# Patient Record
Sex: Male | Born: 1935
Health system: Southern US, Community
[De-identification: ages and names within clinical notes are randomized; demographics above are authoritative.]

## PROBLEM LIST (undated history)

## (undated) DIAGNOSIS — Z950 Presence of cardiac pacemaker: Secondary | ICD-10-CM

## (undated) DIAGNOSIS — E785 Hyperlipidemia, unspecified: Secondary | ICD-10-CM

## (undated) DIAGNOSIS — N289 Disorder of kidney and ureter, unspecified: Secondary | ICD-10-CM

## (undated) DIAGNOSIS — J189 Pneumonia, unspecified organism: Secondary | ICD-10-CM

## (undated) DIAGNOSIS — E119 Type 2 diabetes mellitus without complications: Secondary | ICD-10-CM

## (undated) DIAGNOSIS — I509 Heart failure, unspecified: Secondary | ICD-10-CM

## (undated) DIAGNOSIS — M1990 Unspecified osteoarthritis, unspecified site: Secondary | ICD-10-CM

## (undated) DIAGNOSIS — W19XXXA Unspecified fall, initial encounter: Secondary | ICD-10-CM

## (undated) DIAGNOSIS — D860 Sarcoidosis of lung: Secondary | ICD-10-CM

## (undated) DIAGNOSIS — C801 Malignant (primary) neoplasm, unspecified: Secondary | ICD-10-CM

## (undated) DIAGNOSIS — I499 Cardiac arrhythmia, unspecified: Secondary | ICD-10-CM

## (undated) DIAGNOSIS — I10 Essential (primary) hypertension: Secondary | ICD-10-CM

## (undated) DIAGNOSIS — N184 Chronic kidney disease, stage 4 (severe): Secondary | ICD-10-CM

## (undated) HISTORY — PX: LUNG BIOPSY: SHX232

## (undated) HISTORY — DX: Essential (primary) hypertension: I10

## (undated) HISTORY — DX: Hyperlipidemia, unspecified: E78.5

## (undated) HISTORY — DX: Cardiac arrhythmia, unspecified: I49.9

## (undated) HISTORY — PX: NASAL SINUS SURGERY: SHX719

## (undated) HISTORY — PX: MOUTH SURGERY: SHX715

## (undated) HISTORY — PX: INSERT / REPLACE / REMOVE PACEMAKER: SUR710

## (undated) HISTORY — PX: TUMOR EXCISION: SHX421

## (undated) HISTORY — PX: CATARACT EXTRACTION W/ INTRAOCULAR LENS  IMPLANT, BILATERAL: SHX1307

## (undated) HISTORY — PX: SHOULDER OPEN ROTATOR CUFF REPAIR: SHX2407

---
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
## 1998-04-05 ENCOUNTER — Ambulatory Visit (HOSPITAL_BASED_OUTPATIENT_CLINIC_OR_DEPARTMENT_OTHER): Admission: RE | Admit: 1998-04-05 | Discharge: 1998-04-05 | Payer: Self-pay | Admitting: Orthopaedic Surgery

## 1998-04-25 ENCOUNTER — Encounter: Admission: RE | Admit: 1998-04-25 | Discharge: 1998-07-24 | Payer: Self-pay | Admitting: Orthopaedic Surgery

## 1998-08-01 ENCOUNTER — Encounter: Admission: RE | Admit: 1998-08-01 | Discharge: 1998-10-30 | Payer: Self-pay | Admitting: Orthopaedic Surgery

## 2001-03-30 ENCOUNTER — Encounter: Admission: RE | Admit: 2001-03-30 | Discharge: 2001-03-30 | Payer: Self-pay | Admitting: *Deleted

## 2001-04-21 ENCOUNTER — Ambulatory Visit (HOSPITAL_COMMUNITY): Admission: RE | Admit: 2001-04-21 | Discharge: 2001-04-21 | Payer: Self-pay | Admitting: *Deleted

## 2001-05-27 ENCOUNTER — Ambulatory Visit (HOSPITAL_COMMUNITY): Admission: RE | Admit: 2001-05-27 | Discharge: 2001-05-27 | Payer: Self-pay | Admitting: *Deleted

## 2001-05-27 ENCOUNTER — Encounter: Admission: RE | Admit: 2001-05-27 | Discharge: 2001-05-27 | Payer: Self-pay | Admitting: *Deleted

## 2001-11-19 ENCOUNTER — Ambulatory Visit (HOSPITAL_COMMUNITY): Admission: RE | Admit: 2001-11-19 | Discharge: 2001-11-19 | Payer: Self-pay

## 2001-12-07 ENCOUNTER — Encounter (INDEPENDENT_AMBULATORY_CARE_PROVIDER_SITE_OTHER): Payer: Self-pay | Admitting: *Deleted

## 2001-12-07 ENCOUNTER — Ambulatory Visit (HOSPITAL_BASED_OUTPATIENT_CLINIC_OR_DEPARTMENT_OTHER): Admission: RE | Admit: 2001-12-07 | Discharge: 2001-12-07 | Payer: Self-pay | Admitting: Otolaryngology

## 2005-03-03 ENCOUNTER — Ambulatory Visit: Payer: Self-pay | Admitting: Gastroenterology

## 2005-03-14 ENCOUNTER — Ambulatory Visit: Payer: Self-pay | Admitting: Gastroenterology

## 2009-09-13 ENCOUNTER — Encounter (INDEPENDENT_AMBULATORY_CARE_PROVIDER_SITE_OTHER): Payer: Self-pay | Admitting: *Deleted

## 2009-10-04 ENCOUNTER — Encounter (INDEPENDENT_AMBULATORY_CARE_PROVIDER_SITE_OTHER): Payer: Self-pay | Admitting: *Deleted

## 2009-10-05 ENCOUNTER — Encounter (INDEPENDENT_AMBULATORY_CARE_PROVIDER_SITE_OTHER): Payer: Self-pay | Admitting: *Deleted

## 2009-10-05 ENCOUNTER — Ambulatory Visit: Payer: Self-pay | Admitting: Gastroenterology

## 2009-10-26 ENCOUNTER — Ambulatory Visit: Payer: Self-pay | Admitting: Gastroenterology

## 2009-11-06 ENCOUNTER — Encounter: Payer: Self-pay | Admitting: Gastroenterology

## 2011-02-18 LAB — GLUCOSE, CAPILLARY
Glucose-Capillary: 118 mg/dL — ABNORMAL HIGH (ref 70–99)
Glucose-Capillary: 150 mg/dL — ABNORMAL HIGH (ref 70–99)

## 2011-04-04 NOTE — Op Note (Signed)
[facility]  Patient:    Ruben Conner, Ruben Conner Visit Number: [id number] MRN: [id number]          Service Type: DSU Location: [facility] Attending Physician:  Clare Charon Dictated by:   Leonides Sake Lucia Gaskins, M.D. Proc. Date: 12/07/01 Admit Date:  12/07/2001 Discharge Date: 12/07/2001                             Operative Report  PREOPERATIVE DIAGNOSIS: 1. Left tail of parotid mass, parotid neoplasm versus lymph node.  POSTOPERATIVE DIAGNOSIS:  Left tail of parotid mass, frozen section consistent with Warthins tumor.  OPERATION PERFORMED:  Excision of the left tail of parotid mass without facial nerve dissection.  Direct laryngoscopy.  SURGEON:  Leonides Sake. Lucia Gaskins, M.D.  ANESTHESIA:  General endotracheal.  COMPLICATIONS:  None.  INDICATIONS FOR PROCEDURE:  Ruben Conner is a 77 year old gentleman who has had a slowly enlarging left neck mass located in the region of the tail of the parotid just behind the angle of the mandible, just inferior to the left ear. CT scan shows the mass to be deep to the left tail of the parotid tissue. Question of whether this represents a parotid neoplasm versus a large parotid lymph node.  I have discussed with Dr. Gilford Rile concerning excising this mass. It may or may not require dissection of the facial nerve and that this may represent a parotid neoplasm versus an enlarged lymph node and possible direct laryngoscopy if this represents carcinoma.  Of note, evaluation in the office reveals normal upper airway exam in the office.  DESCRIPTION OF PROCEDURE:  After adequate endotracheal anesthesia, the left neck was prepped with Betadine and draped out with sterile towels.  An incision was made just posterior to the mass.  Dissection was carried down until we hit the parotid fascia.  The mass was located just deep to the tail of parotid on the left side.  In dissection, the greater auricular nerve  was identified and preserved as the dissection was carried out posterior to the greater auricular nerve.  The mass was well encapsulated and was dissected out of the tail of the parotid without any difficulty.  A single 2-0 silk ligature was used to ligate a deep vessel and cautery was used to cauterize small veins.  Specimen was sent for frozen section as it appeared to possibly represent a lymph node versus parotid neoplasm.  It was encapsulated.  There were a few small lymph nodes close by and these were excised with a specimen. The facial nerve was not dissected out.  The wound was irrigated with saline and closed with 3-0 chromic sutures subcutaneously and 5-0 nylon on the skin. While awaiting frozen section, direct laryngoscopy was performed.  The base of tongue, epiglottis, vocal cords, aryepiglottic folds and piriform sinuses were all clear.  Palpation of the parotid area felt benign.  Palpation of the nasopharynx was benign.  This completed direct laryngoscopy.  Frozen section pathology report revealed findings consistent with Warthins tumor.  The patient was awakened from anesthesia and transferred to recovery room postoperatively doing well.  DISPOSITION:  Ruben Conner is discharged home later this morning.  Will have him follow up in the office in one week to have sutures removed and review final pathology. Dictated by:   Leonides Sake Lucia Gaskins, M.D. Attending Physician:  Clare Charon DD:  12/07/01 TD:  12/08/01 Job: [id number]

## 2012-01-27 DIAGNOSIS — N3000 Acute cystitis without hematuria: Secondary | ICD-10-CM | POA: Diagnosis not present

## 2012-01-27 DIAGNOSIS — R3129 Other microscopic hematuria: Secondary | ICD-10-CM | POA: Diagnosis not present

## 2012-01-27 DIAGNOSIS — N401 Enlarged prostate with lower urinary tract symptoms: Secondary | ICD-10-CM | POA: Diagnosis not present

## 2012-01-27 DIAGNOSIS — R82998 Other abnormal findings in urine: Secondary | ICD-10-CM | POA: Diagnosis not present

## 2012-01-27 DIAGNOSIS — N433 Hydrocele, unspecified: Secondary | ICD-10-CM | POA: Diagnosis not present

## 2012-02-09 DIAGNOSIS — E119 Type 2 diabetes mellitus without complications: Secondary | ICD-10-CM | POA: Diagnosis not present

## 2012-02-09 DIAGNOSIS — H4011X0 Primary open-angle glaucoma, stage unspecified: Secondary | ICD-10-CM | POA: Diagnosis not present

## 2012-02-09 DIAGNOSIS — H409 Unspecified glaucoma: Secondary | ICD-10-CM | POA: Diagnosis not present

## 2012-02-09 DIAGNOSIS — H353190 Nonexudative age-related macular degeneration, unspecified eye, stage unspecified: Secondary | ICD-10-CM | POA: Diagnosis not present

## 2012-03-23 DIAGNOSIS — R3129 Other microscopic hematuria: Secondary | ICD-10-CM | POA: Diagnosis not present

## 2012-03-23 DIAGNOSIS — Z87440 Personal history of urinary (tract) infections: Secondary | ICD-10-CM | POA: Diagnosis not present

## 2012-05-10 DIAGNOSIS — E785 Hyperlipidemia, unspecified: Secondary | ICD-10-CM | POA: Diagnosis not present

## 2012-05-10 DIAGNOSIS — Z23 Encounter for immunization: Secondary | ICD-10-CM | POA: Diagnosis not present

## 2012-05-10 DIAGNOSIS — I10 Essential (primary) hypertension: Secondary | ICD-10-CM | POA: Diagnosis not present

## 2012-05-10 DIAGNOSIS — F17200 Nicotine dependence, unspecified, uncomplicated: Secondary | ICD-10-CM | POA: Diagnosis not present

## 2012-05-17 DIAGNOSIS — E785 Hyperlipidemia, unspecified: Secondary | ICD-10-CM | POA: Diagnosis not present

## 2012-05-17 DIAGNOSIS — E119 Type 2 diabetes mellitus without complications: Secondary | ICD-10-CM | POA: Diagnosis not present

## 2012-05-17 DIAGNOSIS — I10 Essential (primary) hypertension: Secondary | ICD-10-CM | POA: Diagnosis not present

## 2012-07-30 DIAGNOSIS — H4011X0 Primary open-angle glaucoma, stage unspecified: Secondary | ICD-10-CM | POA: Diagnosis not present

## 2012-07-30 DIAGNOSIS — H2510 Age-related nuclear cataract, unspecified eye: Secondary | ICD-10-CM | POA: Diagnosis not present

## 2012-08-12 DIAGNOSIS — H2589 Other age-related cataract: Secondary | ICD-10-CM | POA: Diagnosis not present

## 2012-08-12 DIAGNOSIS — H2510 Age-related nuclear cataract, unspecified eye: Secondary | ICD-10-CM | POA: Diagnosis not present

## 2012-08-20 DIAGNOSIS — H2510 Age-related nuclear cataract, unspecified eye: Secondary | ICD-10-CM | POA: Diagnosis not present

## 2012-08-20 DIAGNOSIS — H4011X0 Primary open-angle glaucoma, stage unspecified: Secondary | ICD-10-CM | POA: Diagnosis not present

## 2012-08-26 DIAGNOSIS — H43319 Vitreous membranes and strands, unspecified eye: Secondary | ICD-10-CM | POA: Diagnosis not present

## 2012-08-26 DIAGNOSIS — H2510 Age-related nuclear cataract, unspecified eye: Secondary | ICD-10-CM | POA: Diagnosis not present

## 2012-08-26 DIAGNOSIS — H2589 Other age-related cataract: Secondary | ICD-10-CM | POA: Diagnosis not present

## 2012-09-27 DIAGNOSIS — Z23 Encounter for immunization: Secondary | ICD-10-CM | POA: Diagnosis not present

## 2012-11-26 DIAGNOSIS — E119 Type 2 diabetes mellitus without complications: Secondary | ICD-10-CM | POA: Diagnosis not present

## 2012-12-03 DIAGNOSIS — E119 Type 2 diabetes mellitus without complications: Secondary | ICD-10-CM | POA: Diagnosis not present

## 2012-12-03 DIAGNOSIS — I10 Essential (primary) hypertension: Secondary | ICD-10-CM | POA: Diagnosis not present

## 2012-12-03 DIAGNOSIS — E785 Hyperlipidemia, unspecified: Secondary | ICD-10-CM | POA: Diagnosis not present

## 2013-04-19 DIAGNOSIS — H524 Presbyopia: Secondary | ICD-10-CM | POA: Diagnosis not present

## 2013-04-19 DIAGNOSIS — H353190 Nonexudative age-related macular degeneration, unspecified eye, stage unspecified: Secondary | ICD-10-CM | POA: Diagnosis not present

## 2013-04-19 DIAGNOSIS — E119 Type 2 diabetes mellitus without complications: Secondary | ICD-10-CM | POA: Diagnosis not present

## 2013-04-19 DIAGNOSIS — H4011X0 Primary open-angle glaucoma, stage unspecified: Secondary | ICD-10-CM | POA: Diagnosis not present

## 2013-05-02 ENCOUNTER — Telehealth: Payer: Self-pay | Admitting: Family Medicine

## 2013-05-02 ENCOUNTER — Encounter: Payer: Self-pay | Admitting: Physician Assistant

## 2013-05-02 ENCOUNTER — Ambulatory Visit (INDEPENDENT_AMBULATORY_CARE_PROVIDER_SITE_OTHER): Payer: Medicare Other | Admitting: Physician Assistant

## 2013-05-02 VITALS — BP 141/62 | HR 46 | Temp 97.4°F | Ht 72.0 in | Wt 204.0 lb

## 2013-05-02 DIAGNOSIS — L0390 Cellulitis, unspecified: Secondary | ICD-10-CM | POA: Diagnosis not present

## 2013-05-02 DIAGNOSIS — E119 Type 2 diabetes mellitus without complications: Secondary | ICD-10-CM

## 2013-05-02 DIAGNOSIS — E7800 Pure hypercholesterolemia, unspecified: Secondary | ICD-10-CM | POA: Insufficient documentation

## 2013-05-02 DIAGNOSIS — E785 Hyperlipidemia, unspecified: Secondary | ICD-10-CM | POA: Diagnosis not present

## 2013-05-02 DIAGNOSIS — L0291 Cutaneous abscess, unspecified: Secondary | ICD-10-CM | POA: Diagnosis not present

## 2013-05-02 DIAGNOSIS — E1122 Type 2 diabetes mellitus with diabetic chronic kidney disease: Secondary | ICD-10-CM | POA: Insufficient documentation

## 2013-05-02 MED ORDER — CEPHALEXIN 500 MG PO CAPS: 500.0000 mg | ORAL_CAPSULE | Freq: Two times a day (BID) | ORAL | Status: DC

## 2013-05-02 NOTE — Telephone Encounter (Signed)
appt made

## 2013-05-02 NOTE — Progress Notes (Signed)
Subjective:     Patient ID: Ruben Conner, male   DOB: 07-26-1930, 79 y.o.   MRN: [id number]  HPI Pt with draining area to this R buttocks States he has a hx of pilonidal cyst removal ~ 40 yrs ago and has done well He noticed a similar area to the R buttock that started several days ago It increased in size and pain but ruptured this am Sx improved since rupture No hx of MRSA  Review of Systems  All other systems reviewed and are negative.       Objective:   Physical Exam  Nursing note and vitals reviewed. Pt with ~ 3cm area to the medial R buttock + drainage + erythema and induration ++ TTP Area cleansed and dressing placed     Assessment:     Abscess    Plan:     Wound care reviewed Keflex 500mg  bid x 10 days Warm soaks F/U prn

## 2013-05-02 NOTE — Patient Instructions (Signed)
Abscess An abscess is an infected area that contains a collection of pus and debris.It can occur in almost any part of the body. An abscess is also known as a furuncle or boil. CAUSES  An abscess occurs when tissue gets infected. This can occur from blockage of oil or sweat glands, infection of hair follicles, or a minor injury to the skin. As the body tries to fight the infection, pus collects in the area and creates pressure under the skin. This pressure causes pain. People with weakened immune systems have difficulty fighting infections and get certain abscesses more often.  SYMPTOMS Usually an abscess develops on the skin and becomes a painful mass that is red, warm, and tender. If the abscess forms under the skin, you may feel a moveable soft area under the skin. Some abscesses break open (rupture) on their own, but most will continue to get worse without care. The infection can spread deeper into the body and eventually into the bloodstream, causing you to feel ill.  DIAGNOSIS  Your caregiver will take your medical history and perform a physical exam. A sample of fluid may also be taken from the abscess to determine what is causing your infection. TREATMENT  Your caregiver may prescribe antibiotic medicines to fight the infection. However, taking antibiotics alone usually does not cure an abscess. Your caregiver may need to make a small cut (incision) in the abscess to drain the pus. In some cases, gauze is packed into the abscess to reduce pain and to continue draining the area. HOME CARE INSTRUCTIONS   Only take over-the-counter or prescription medicines for pain, discomfort, or fever as directed by your caregiver.  If you were prescribed antibiotics, take them as directed. Finish them even if you start to feel better.  If gauze is used, follow your caregiver's directions for changing the gauze.  To avoid spreading the infection:  Keep your draining abscess covered with a  bandage.  Wash your hands well.  Do not share personal care items, towels, or whirlpools with others.  Avoid skin contact with others.  Keep your skin and clothes clean around the abscess.  Keep all follow-up appointments as directed by your caregiver. SEEK MEDICAL CARE IF:   You have increased pain, swelling, redness, fluid drainage, or bleeding.  You have muscle aches, chills, or a general ill feeling.  You have a fever. MAKE SURE YOU:   Understand these instructions.  Will watch your condition.  Will get help right away if you are not doing well or get worse. Document Released: 08/13/2005 Document Revised: 05/04/2012 Document Reviewed: 01/16/2012 [employer] Patient Information 2014 [employer].  

## 2013-05-17 DIAGNOSIS — Z87440 Personal history of urinary (tract) infections: Secondary | ICD-10-CM | POA: Diagnosis not present

## 2013-05-17 DIAGNOSIS — R3129 Other microscopic hematuria: Secondary | ICD-10-CM | POA: Diagnosis not present

## 2013-08-24 DIAGNOSIS — Z23 Encounter for immunization: Secondary | ICD-10-CM | POA: Diagnosis not present

## 2013-09-05 ENCOUNTER — Other Ambulatory Visit (INDEPENDENT_AMBULATORY_CARE_PROVIDER_SITE_OTHER): Payer: Medicare Other

## 2013-09-05 DIAGNOSIS — I10 Essential (primary) hypertension: Secondary | ICD-10-CM

## 2013-09-05 DIAGNOSIS — E785 Hyperlipidemia, unspecified: Secondary | ICD-10-CM

## 2013-09-05 DIAGNOSIS — E119 Type 2 diabetes mellitus without complications: Secondary | ICD-10-CM | POA: Diagnosis not present

## 2013-09-05 NOTE — Progress Notes (Signed)
Patient came in with orders from Dr. Milford Cage is making appointment to see Dr.Moore

## 2013-09-06 LAB — LIPID PANEL
HDL: 37 mg/dL — ABNORMAL LOW (ref >39)
Triglycerides: 139 mg/dL (ref 0–149)
VLDL Cholesterol Cal: 28 mg/dL (ref 5–40)

## 2013-09-06 LAB — CMP14+EGFR
ALT: 9 IU/L (ref 0–44)
AST: 12 IU/L (ref 0–40)
CO2: 27 mmol/L (ref 18–29)
Calcium: 9.2 mg/dL (ref 8.6–10.2)
Creatinine, Ser: 1.55 mg/dL — ABNORMAL HIGH (ref 0.76–1.27)
Globulin, Total: 2.5 g/dL (ref 1.5–4.5)
Glucose: 117 mg/dL — ABNORMAL HIGH (ref 65–99)
Sodium: 140 mmol/L (ref 134–144)
Total Protein: 6.7 g/dL (ref 6.0–8.5)

## 2013-09-13 ENCOUNTER — Ambulatory Visit: Payer: Medicare Other | Admitting: Family Medicine

## 2013-09-20 ENCOUNTER — Ambulatory Visit (INDEPENDENT_AMBULATORY_CARE_PROVIDER_SITE_OTHER): Payer: Medicare Other

## 2013-09-20 ENCOUNTER — Ambulatory Visit (INDEPENDENT_AMBULATORY_CARE_PROVIDER_SITE_OTHER): Payer: Medicare Other | Admitting: Family Medicine

## 2013-09-20 ENCOUNTER — Encounter: Payer: Self-pay | Admitting: Family Medicine

## 2013-09-20 VITALS — BP 134/62 | HR 46 | Temp 97.3°F | Ht 72.0 in | Wt 205.0 lb

## 2013-09-20 DIAGNOSIS — I10 Essential (primary) hypertension: Secondary | ICD-10-CM

## 2013-09-20 DIAGNOSIS — E785 Hyperlipidemia, unspecified: Secondary | ICD-10-CM | POA: Diagnosis not present

## 2013-09-20 DIAGNOSIS — E119 Type 2 diabetes mellitus without complications: Secondary | ICD-10-CM

## 2013-09-20 MED ORDER — GLIMEPIRIDE 4 MG PO TABS: 4.0000 mg | ORAL_TABLET | Freq: Every day | ORAL | Status: DC

## 2013-09-20 MED ORDER — METOPROLOL TARTRATE 25 MG PO TABS: 25.0000 mg | ORAL_TABLET | Freq: Two times a day (BID) | ORAL | Status: DC

## 2013-09-20 MED ORDER — OLMESARTAN MEDOXOMIL-HCTZ 40-25 MG PO TABS: 1.0000 | ORAL_TABLET | Freq: Every day | ORAL | Status: DC

## 2013-09-20 MED ORDER — PIOGLITAZONE HCL 30 MG PO TABS: 30.0000 mg | ORAL_TABLET | Freq: Every day | ORAL | Status: DC

## 2013-09-20 NOTE — Patient Instructions (Addendum)
Monitor blood sugars regularly Check blood pressures when possible Continue to take current medication Be careful and don't put yourself at risk for falling Watch sodium intake Use support hose daily, put these on  before getting out of bed in the morning You'll need to get a Prevnar shot at the next visit

## 2013-09-20 NOTE — Progress Notes (Signed)
Subjective:    Patient ID: Ruben Conner, male    DOB: January 28, 1930, 79 y.o.   MRN: [id number]  HPI Pt here for follow up and management of chronic medical problems.      Patient Active Problem List   Diagnosis Date Noted  . HTN (hypertension) 09/20/2013  . Diabetes 05/02/2013  . Other and unspecified hyperlipidemia 05/02/2013   Outpatient Encounter Prescriptions as of 09/20/2013  Medication Sig  . amLODipine (NORVASC) 5 MG tablet Take 5 mg by mouth 2 (two) times daily.  . benazepril (LOTENSIN) 20 MG tablet Take 20 mg by mouth 2 (two) times daily.  . bimatoprost (LUMIGAN) 0.03 % ophthalmic solution 1 drop at bedtime.  Marland Kitchen glimepiride (AMARYL) 4 MG tablet Take 1 tablet by mouth daily.  . metoprolol tartrate (LOPRESSOR) 25 MG tablet Take 25 mg by mouth 2 (two) times daily.  . niacin (NIASPAN) 500 MG CR tablet Take 500 mg by mouth 2 (two) times daily.  Marland Kitchen olmesartan-hydrochlorothiazide (BENICAR HCT) 40-25 MG per tablet Take 1 tablet by mouth daily.  . pioglitazone (ACTOS) 30 MG tablet Take 1 tablet by mouth daily.  . potassium chloride SA (K-DUR,KLOR-CON) 20 MEQ tablet Take 20 mEq by mouth daily.  . [DISCONTINUED] cephALEXin (KEFLEX) 500 MG capsule Take 1 capsule (500 mg total) by mouth 2 (two) times daily.  . [DISCONTINUED] pioglitazone-glimepiride (DUETACT) 30-4 MG per tablet Take 1 tablet by mouth daily with breakfast.    Review of Systems  Constitutional: Negative.   HENT: Negative.   Eyes: Negative.   Respiratory: Negative.   Cardiovascular: Negative.   Gastrointestinal: Negative.   Endocrine: Negative.   Genitourinary: Negative.   Musculoskeletal: Positive for arthralgias (knee problem).  Skin: Negative.   Allergic/Immunologic: Negative.   Neurological: Negative.   Hematological: Negative.   Psychiatric/Behavioral: Negative.        Objective:   Physical Exam  Nursing note and vitals reviewed. Constitutional: He is oriented to person, place, and time. He appears  well-developed and well-nourished. No distress.  HENT:  Head: Normocephalic and atraumatic.  Right Ear: External ear normal.  Left Ear: External ear normal.  Nose: Nose normal.  Mouth/Throat: Oropharynx is clear and moist. No oropharyngeal exudate.  Eyes: Conjunctivae and EOM are normal. Pupils are equal, round, and reactive to light. Right eye exhibits no discharge. Left eye exhibits no discharge. No scleral icterus.  Neck: Normal range of motion. Neck supple. No tracheal deviation present. No thyromegaly present.  Cardiovascular: Normal rate, regular rhythm, normal heart sounds and intact distal pulses.  Exam reveals no gallop and no friction rub.   No murmur heard. At 60 per minute  Pulmonary/Chest: Effort normal and breath sounds normal. No respiratory distress. He has no wheezes. He has no rales. He exhibits no tenderness.  Abdominal: Soft. Bowel sounds are normal. He exhibits no mass. There is no tenderness. There is no rebound and no guarding.  Musculoskeletal: Normal range of motion. He exhibits edema. He exhibits no tenderness.  2+ pedal edema bilateral  Lymphadenopathy:    He has no cervical adenopathy.  Neurological: He is alert and oriented to person, place, and time. He has normal reflexes. No cranial nerve deficit.  Skin: Skin is warm and dry. No rash noted. No erythema. No pallor.  Psychiatric: He has a normal mood and affect. His behavior is normal. Judgment and thought content normal.   BP 134/62  Pulse 46  Temp(Src) 97.3 F (36.3 C) (Oral)  Ht 6' (1.829 m)  Wt 205 lb (  92.987 kg)  BMI 27.80 kg/m2  [facility] reading (PRIMARY) by  Dr.Taten Merrow: Within normal limits chest x-ray                                        Assessment & Plan:   1. Diabetes   2. Other and unspecified hyperlipidemia   3. HTN (hypertension)    Orders Placed This Encounter  Procedures  . DG Chest 2 View    Standing Status: Future     Number of Occurrences: 1     Standing Expiration Date:  11/20/2014    Order Specific Question:  Reason for Exam (SYMPTOM  OR DIAGNOSIS REQUIRED)    Answer:  htn    Order Specific Question:  Preferred imaging location?    Answer:  Internal  . Microalbumin, urine  . POCT UA - Microalbumin   Meds ordered this encounter  Medications  . DISCONTD: glimepiride (AMARYL) 4 MG tablet    Sig: Take 1 tablet by mouth daily.  Marland Kitchen DISCONTD: pioglitazone (ACTOS) 30 MG tablet    Sig: Take 1 tablet by mouth daily.  . pioglitazone (ACTOS) 30 MG tablet    Sig: Take 1 tablet (30 mg total) by mouth daily.    Dispense:  30 tablet    Refill:  6  . glimepiride (AMARYL) 4 MG tablet    Sig: Take 1 tablet (4 mg total) by mouth daily.    Dispense:  30 tablet    Refill:  6  . olmesartan-hydrochlorothiazide (BENICAR HCT) 40-25 MG per tablet    Sig: Take 1 tablet by mouth daily.    Dispense:  30 tablet    Refill:  6  . metoprolol tartrate (LOPRESSOR) 25 MG tablet    Sig: Take 1 tablet (25 mg total) by mouth 2 (two) times daily.    Dispense:  60 tablet    Refill:  6   Patient Instructions  Monitor blood sugars regularly Check blood pressures when possible Continue to take current medication Be careful and don't put yourself at risk for falling Watch sodium intake Use support hose daily, put these on  before getting out of bed in the morning You'll need to get a Prevnar shot at the next visit   Arrie Senate MD

## 2013-09-21 LAB — MICROALBUMIN, URINE: Microalbumin, Urine: 1266.2 ug/mL — ABNORMAL HIGH (ref 0.0–17.0)

## 2013-09-22 ENCOUNTER — Other Ambulatory Visit (INDEPENDENT_AMBULATORY_CARE_PROVIDER_SITE_OTHER): Payer: Medicare Other

## 2013-09-22 DIAGNOSIS — Z1212 Encounter for screening for malignant neoplasm of rectum: Secondary | ICD-10-CM | POA: Diagnosis not present

## 2013-09-25 LAB — FECAL OCCULT BLOOD, IMMUNOCHEMICAL: Fecal Occult Bld: NEGATIVE

## 2014-05-09 ENCOUNTER — Other Ambulatory Visit: Payer: Self-pay | Admitting: Family Medicine

## 2014-05-11 ENCOUNTER — Other Ambulatory Visit: Payer: Self-pay | Admitting: *Deleted

## 2014-05-11 MED ORDER — BENAZEPRIL HCL 20 MG PO TABS: 20.0000 mg | ORAL_TABLET | Freq: Two times a day (BID) | ORAL | Status: DC

## 2014-05-11 MED ORDER — POTASSIUM CHLORIDE CRYS ER 20 MEQ PO TBCR: 20.0000 meq | EXTENDED_RELEASE_TABLET | Freq: Every day | ORAL | Status: DC

## 2014-05-11 MED ORDER — AMLODIPINE BESYLATE 5 MG PO TABS: 5.0000 mg | ORAL_TABLET | Freq: Two times a day (BID) | ORAL | Status: DC

## 2014-06-09 ENCOUNTER — Other Ambulatory Visit: Payer: Self-pay | Admitting: Family Medicine

## 2014-06-19 ENCOUNTER — Other Ambulatory Visit: Payer: Self-pay | Admitting: *Deleted

## 2014-06-19 MED ORDER — PIOGLITAZONE HCL 30 MG PO TABS: 30.0000 mg | ORAL_TABLET | Freq: Every day | ORAL | Status: DC

## 2014-07-06 ENCOUNTER — Other Ambulatory Visit (INDEPENDENT_AMBULATORY_CARE_PROVIDER_SITE_OTHER): Payer: Medicare Other

## 2014-07-06 DIAGNOSIS — R5383 Other fatigue: Secondary | ICD-10-CM

## 2014-07-06 DIAGNOSIS — I10 Essential (primary) hypertension: Secondary | ICD-10-CM

## 2014-07-06 DIAGNOSIS — E559 Vitamin D deficiency, unspecified: Secondary | ICD-10-CM | POA: Diagnosis not present

## 2014-07-06 DIAGNOSIS — R5381 Other malaise: Secondary | ICD-10-CM

## 2014-07-06 LAB — POCT CBC
Granulocyte percent: 81.5 %G — AB (ref 37–80)
HCT, POC: 40.3 % — AB (ref 43.5–53.7)
Hemoglobin: 13.7 g/dL — AB (ref 14.1–18.1)
LYMPH, POC: 1.5 (ref 0.6–3.4)
MCH, POC: 30.1 pg (ref 27–31.2)
MCHC: 34 g/dL (ref 31.8–35.4)
MCV: 88.6 fL (ref 80–97)
MPV: 8 fL (ref 0–99.8)
PLATELET COUNT, POC: 180 10*3/uL (ref 142–424)
POC Granulocyte: 7.6 — AB (ref 2–6.9)
POC LYMPH %: 15.8 % (ref 10–50)
RBC: 4.6 M/uL — AB (ref 4.69–6.13)
RDW, POC: 12.9 %
WBC: 9.3 10*3/uL (ref 4.6–10.2)

## 2014-07-06 LAB — POCT GLYCOSYLATED HEMOGLOBIN (HGB A1C): Hemoglobin A1C: 5.5

## 2014-07-07 LAB — NMR, LIPOPROFILE
CHOLESTEROL: 140 mg/dL (ref 100–199)
HDL CHOLESTEROL BY NMR: 35 mg/dL — AB (ref >39)
HDL PARTICLE NUMBER: 23.6 umol/L — AB (ref >=30.5)
LDL PARTICLE NUMBER: 662 nmol/L (ref <1000)
LDL Size: 21.1 nm (ref >20.5)
LDLC SERPL CALC-MCNC: 83 mg/dL (ref 0–99)
LP-IR Score: 42 (ref <=45)
Small LDL Particle Number: <90 nmol/L (ref <=527)
Triglycerides by NMR: 108 mg/dL (ref 0–149)

## 2014-07-07 LAB — HEPATIC FUNCTION PANEL
ALBUMIN: 4.1 g/dL (ref 3.5–4.7)
ALT: 7 IU/L (ref 0–44)
AST: 13 IU/L (ref 0–40)
Alkaline Phosphatase: 65 IU/L (ref 39–117)
Bilirubin, Direct: 0.12 mg/dL (ref 0.00–0.40)
TOTAL PROTEIN: 7.1 g/dL (ref 6.0–8.5)
Total Bilirubin: 0.5 mg/dL (ref 0.0–1.2)

## 2014-07-07 LAB — BMP8+EGFR
BUN/Creatinine Ratio: 16 (ref 10–22)
BUN: 30 mg/dL — ABNORMAL HIGH (ref 8–27)
CALCIUM: 9.6 mg/dL (ref 8.6–10.2)
CHLORIDE: 102 mmol/L (ref 97–108)
CO2: 22 mmol/L (ref 18–29)
Creatinine, Ser: 1.93 mg/dL — ABNORMAL HIGH (ref 0.76–1.27)
GFR calc Af Amer: 36 mL/min/{1.73_m2} — ABNORMAL LOW (ref >59)
GFR calc non Af Amer: 32 mL/min/{1.73_m2} — ABNORMAL LOW (ref >59)
GLUCOSE: 101 mg/dL — AB (ref 65–99)
Potassium: 4.6 mmol/L (ref 3.5–5.2)
Sodium: 140 mmol/L (ref 134–144)

## 2014-07-07 LAB — VITAMIN D 25 HYDROXY (VIT D DEFICIENCY, FRACTURES): Vit D, 25-Hydroxy: 13.5 ng/mL — ABNORMAL LOW (ref 30.0–100.0)

## 2014-07-13 ENCOUNTER — Ambulatory Visit (INDEPENDENT_AMBULATORY_CARE_PROVIDER_SITE_OTHER): Payer: Medicare Other | Admitting: Family Medicine

## 2014-07-13 ENCOUNTER — Encounter: Payer: Self-pay | Admitting: Family Medicine

## 2014-07-13 VITALS — BP 160/72 | HR 44 | Temp 97.2°F | Ht 72.0 in | Wt 198.0 lb

## 2014-07-13 DIAGNOSIS — I498 Other specified cardiac arrhythmias: Secondary | ICD-10-CM | POA: Diagnosis not present

## 2014-07-13 DIAGNOSIS — I10 Essential (primary) hypertension: Secondary | ICD-10-CM

## 2014-07-13 DIAGNOSIS — R799 Abnormal finding of blood chemistry, unspecified: Secondary | ICD-10-CM

## 2014-07-13 DIAGNOSIS — E785 Hyperlipidemia, unspecified: Secondary | ICD-10-CM | POA: Diagnosis not present

## 2014-07-13 DIAGNOSIS — E119 Type 2 diabetes mellitus without complications: Secondary | ICD-10-CM

## 2014-07-13 DIAGNOSIS — D631 Anemia in chronic kidney disease: Secondary | ICD-10-CM

## 2014-07-13 DIAGNOSIS — K4020 Bilateral inguinal hernia, without obstruction or gangrene, not specified as recurrent: Secondary | ICD-10-CM

## 2014-07-13 DIAGNOSIS — N189 Chronic kidney disease, unspecified: Secondary | ICD-10-CM

## 2014-07-13 DIAGNOSIS — I454 Nonspecific intraventricular block: Secondary | ICD-10-CM

## 2014-07-13 DIAGNOSIS — R001 Bradycardia, unspecified: Secondary | ICD-10-CM

## 2014-07-13 DIAGNOSIS — N039 Chronic nephritic syndrome with unspecified morphologic changes: Secondary | ICD-10-CM

## 2014-07-13 DIAGNOSIS — R7989 Other specified abnormal findings of blood chemistry: Secondary | ICD-10-CM

## 2014-07-13 MED ORDER — POTASSIUM CHLORIDE CRYS ER 20 MEQ PO TBCR: EXTENDED_RELEASE_TABLET | ORAL | Status: DC

## 2014-07-13 MED ORDER — PIOGLITAZONE HCL 30 MG PO TABS: 30.0000 mg | ORAL_TABLET | Freq: Every day | ORAL | Status: DC

## 2014-07-13 NOTE — Progress Notes (Signed)
Subjective:    Patient ID: Ruben Conner, male    DOB: 11-Jun-1930, 80 y.o.   MRN: [id number]  HPI Pt here for follow up and management of chronic medical problems. The patient has no specific complaints today. He is due to have a prostate rectal exam today. He also will have his lab work reviewed with him. He requests refills on potassium and Actos. The patient complains of being somewhat tired. He indicates that his blood pressures at home run in the upper 117 range over the 70 range.         Patient Active Problem List   Diagnosis Date Noted  . HTN (hypertension) 09/20/2013  . Diabetes 05/02/2013  . Other and unspecified hyperlipidemia 05/02/2013   Outpatient Encounter Prescriptions as of 07/13/2014  Medication Sig  . amLODipine (NORVASC) 5 MG tablet TAKE  (1)  TABLET TWICE A DAY.  . benazepril (LOTENSIN) 20 MG tablet TAKE  (1)  TABLET TWICE A DAY.  . BENICAR HCT 40-25 MG per tablet Take 1 tablet by mouth daily.  . bimatoprost (LUMIGAN) 0.03 % ophthalmic solution 1 drop at bedtime.  Marland Kitchen glimepiride (AMARYL) 4 MG tablet Take 1 tablet (4 mg total) by mouth daily.  . metoprolol tartrate (LOPRESSOR) 25 MG tablet Take 1 tablet (25 mg total) by mouth 2 (two) times daily.  . niacin (NIASPAN) 500 MG CR tablet Take 500 mg by mouth 2 (two) times daily.  . pioglitazone (ACTOS) 30 MG tablet Take 1 tablet (30 mg total) by mouth daily.  . potassium chloride SA (K-DUR,KLOR-CON) 20 MEQ tablet TAKE 1 TABLET DAILY  . [DISCONTINUED] glimepiride (AMARYL) 4 MG tablet Take 1 tablet (4 mg total) by mouth daily.    Review of Systems  Constitutional: Negative.   HENT: Negative.   Eyes: Negative.   Respiratory: Negative.   Cardiovascular: Negative.   Gastrointestinal: Negative.   Endocrine: Negative.   Genitourinary: Negative.   Musculoskeletal: Negative.   Skin: Negative.   Allergic/Immunologic: Negative.   Neurological: Negative.   Hematological: Negative.   Psychiatric/Behavioral: Negative.         Objective:   Physical Exam  Nursing note and vitals reviewed. Constitutional: He is oriented to person, place, and time. He appears well-developed and well-nourished. No distress.  HENT:  Head: Normocephalic and atraumatic.  Right Ear: External ear normal.  Left Ear: External ear normal.  Nose: Nose normal.  Mouth/Throat: Oropharynx is clear and moist. No oropharyngeal exudate.  Eyes: Conjunctivae and EOM are normal. Pupils are equal, round, and reactive to light. Right eye exhibits no discharge. Left eye exhibits no discharge. No scleral icterus.  Neck: Normal range of motion. Neck supple. No thyromegaly present.  No carotid bruits were audible  Cardiovascular: Normal rate, regular rhythm, normal heart sounds and intact distal pulses.  Exam reveals no gallop and no friction rub.   No murmur heard. Heart sounds are distant and the rate appears to be about 60 per minute  Pulmonary/Chest: Effort normal and breath sounds normal. No respiratory distress. He has no wheezes. He has no rales. He exhibits no tenderness.  Abdominal: Soft. Bowel sounds are normal. He exhibits no mass. There is no tenderness. There is no rebound and no guarding.  Genitourinary: Rectum normal and penis normal.  The prostate is extremely enlarged and there is no rectal masses. The prostate gland was smooth. There were and 2 large bilateral inguinal hernias present.  Musculoskeletal: Normal range of motion. He exhibits no edema and no tenderness.  Lymphadenopathy:    He has no cervical adenopathy.  Neurological: He is alert and oriented to person, place, and time. He has normal reflexes. No cranial nerve deficit.  Skin: Skin is warm and dry. No rash noted. No erythema. No pallor.  Psychiatric: He has a normal mood and affect. His behavior is normal. Judgment and thought content normal.   BP 150/70  Pulse 44  Temp(Src) 97.2 F (36.2 C) (Oral)  Ht 6' (1.829 m)  Wt 198 lb (89.812 kg)  BMI 26.85  kg/m2  EKG: Bradycardia at 41 beats per minute and left bundle branch block       Assessment & Plan:  1. Essential hypertension  2. Type 2 diabetes mellitus without complication  3. Other and unspecified hyperlipidemia  4. Anemia in chronic kidney disease  5. Elevated serum creatinine -Repeat today -Appointment with nephrologist  6. Bilateral inguinal hernia without obstruction or gangrene, recurrence not specified  7. CKD (chronic kidney disease), unspecified stage -Appointment with nephrologist  Patient Instructions                       Medicare Annual Wellness Visit  [facility] and the medical providers at [facility] strive to bring you the best medical care.  In doing so we not only want to address your current medical conditions and concerns but also to detect new conditions early and prevent illness, disease and health-related problems.    Medicare offers a yearly Wellness Visit which allows our clinical staff to assess your need for preventative services including immunizations, lifestyle education, counseling to decrease risk of preventable diseases and screening for fall risk and other medical concerns.    This visit is provided free of charge (no copay) for all Medicare recipients. The clinical pharmacists at [facility] have begun to conduct these Wellness Visits which will also include a thorough review of all your medications.    As you primary medical provider recommend that you make an appointment for your Annual Wellness Visit if you have not done so already this year.  You may set up this appointment before you leave today or you may call back ([phone]) and schedule an appointment.  Please make sure when you call that you mention that you are scheduling your Annual Wellness Visit with the clinical pharmacist so that the appointment may be made for the proper length of time.     Continue current  medications. Continue good therapeutic lifestyle changes which include good diet and exercise. Fall precautions discussed with patient. If an FOBT was given today- please return it to our front desk. If you are over 50 years old - you may need Prevnar 63 or the adult Pneumonia vaccine.  Flu Shots will be available at our office starting mid- September. Please call and schedule a FLU CLINIC APPOINTMENT.   We will arrange for you to see a nephrologist because of your rising creatinine, preferably Dr. Mercy Lillyian Heidt  be sure and check some bod pressure readings at home and take these readings with you when you go see the nephrologist Also bring some readings for [place] to review and a couple of weeks Continue to avoid NSAIDs as you are already doing   Arrie Senate MD

## 2014-07-13 NOTE — Patient Instructions (Addendum)
Medicare Annual Wellness Visit  [facility] and the medical providers at [facility] strive to bring you the best medical care.  In doing so we not only want to address your current medical conditions and concerns but also to detect new conditions early and prevent illness, disease and health-related problems.    Medicare offers a yearly Wellness Visit which allows our clinical staff to assess your need for preventative services including immunizations, lifestyle education, counseling to decrease risk of preventable diseases and screening for fall risk and other medical concerns.    This visit is provided free of charge (no copay) for all Medicare recipients. The clinical pharmacists at [facility] have begun to conduct these Wellness Visits which will also include a thorough review of all your medications.    As you primary medical provider recommend that you make an appointment for your Annual Wellness Visit if you have not done so already this year.  You may set up this appointment before you leave today or you may call back ([phone]) and schedule an appointment.  Please make sure when you call that you mention that you are scheduling your Annual Wellness Visit with the clinical pharmacist so that the appointment may be made for the proper length of time.     Continue current medications. Continue good therapeutic lifestyle changes which include good diet and exercise. Fall precautions discussed with patient. If an FOBT was given today- please return it to our front desk. If you are over 8 years old - you may need Prevnar 56 or the adult Pneumonia vaccine.  Flu Shots will be available at our office starting mid- September. Please call and schedule a FLU CLINIC APPOINTMENT.   We will arrange for you to see a nephrologist because of your rising creatinine, preferably Dr. Mercy Moore  be sure and check some bod pressure readings  at home and take these readings with you when you go see the nephrologist Also bring some readings for [place] to review and a couple of weeks Continue to avoid NSAIDs as you are already doing

## 2014-07-14 LAB — BMP8+EGFR
BUN/Creatinine Ratio: 16 (ref 10–22)
BUN: 27 mg/dL (ref 8–27)
CO2: 24 mmol/L (ref 18–29)
Calcium: 9.3 mg/dL (ref 8.6–10.2)
Chloride: 99 mmol/L (ref 97–108)
Creatinine, Ser: 1.69 mg/dL — ABNORMAL HIGH (ref 0.76–1.27)
GFR calc non Af Amer: 37 mL/min/{1.73_m2} — ABNORMAL LOW (ref >59)
GFR, EST AFRICAN AMERICAN: 43 mL/min/{1.73_m2} — AB (ref >59)
GLUCOSE: 72 mg/dL (ref 65–99)
Potassium: 3.9 mmol/L (ref 3.5–5.2)
Sodium: 140 mmol/L (ref 134–144)

## 2014-07-25 ENCOUNTER — Encounter: Payer: Self-pay | Admitting: Gastroenterology

## 2014-08-16 ENCOUNTER — Telehealth: Payer: Self-pay | Admitting: Family Medicine

## 2014-08-16 ENCOUNTER — Encounter: Payer: Self-pay | Admitting: Gastroenterology

## 2014-08-16 NOTE — Telephone Encounter (Signed)
Pt brought by BP readings for DWM to review. We will call pt back with any changes or concerns- pt aware of this

## 2014-08-17 ENCOUNTER — Telehealth: Payer: Self-pay | Admitting: *Deleted

## 2014-08-17 MED ORDER — METOPROLOL TARTRATE 25 MG PO TABS: ORAL_TABLET | ORAL | Status: DC

## 2014-08-17 NOTE — Telephone Encounter (Signed)
After reviewing BP and pulse rates from home- DWM recommended pt decrease Metoprolol from 25 mg BID to 12.5 BID- pt aware of change and change made in [facility]

## 2014-08-18 DIAGNOSIS — H3531 Nonexudative age-related macular degeneration: Secondary | ICD-10-CM | POA: Diagnosis not present

## 2014-08-18 DIAGNOSIS — H4010X1 Unspecified open-angle glaucoma, mild stage: Secondary | ICD-10-CM | POA: Diagnosis not present

## 2014-08-18 DIAGNOSIS — H4011X1 Primary open-angle glaucoma, mild stage: Secondary | ICD-10-CM | POA: Diagnosis not present

## 2014-08-18 DIAGNOSIS — E119 Type 2 diabetes mellitus without complications: Secondary | ICD-10-CM | POA: Diagnosis not present

## 2014-08-21 DIAGNOSIS — Z23 Encounter for immunization: Secondary | ICD-10-CM | POA: Diagnosis not present

## 2014-08-31 ENCOUNTER — Encounter: Payer: Self-pay | Admitting: Cardiovascular Disease

## 2014-08-31 ENCOUNTER — Ambulatory Visit (INDEPENDENT_AMBULATORY_CARE_PROVIDER_SITE_OTHER): Payer: Medicare Other | Admitting: Cardiovascular Disease

## 2014-08-31 VITALS — BP 158/69 | HR 45 | Ht 72.0 in | Wt 199.7 lb

## 2014-08-31 DIAGNOSIS — N2889 Other specified disorders of kidney and ureter: Secondary | ICD-10-CM

## 2014-08-31 DIAGNOSIS — I151 Hypertension secondary to other renal disorders: Secondary | ICD-10-CM

## 2014-08-31 DIAGNOSIS — I440 Atrioventricular block, first degree: Secondary | ICD-10-CM

## 2014-08-31 DIAGNOSIS — I447 Left bundle-branch block, unspecified: Secondary | ICD-10-CM | POA: Insufficient documentation

## 2014-08-31 DIAGNOSIS — R001 Bradycardia, unspecified: Secondary | ICD-10-CM

## 2014-08-31 NOTE — Progress Notes (Signed)
Patient ID: Ruben Conner, male   DOB: June 29, 1930, 80 y.o.   MRN: [id number]      Reason for office visit Bradycardia, left bundle branch block  I have met Ruben Conner on several occasions when he accompanies his wife Ruben Conner to our clinic. She is my patient, but this is the first time he is here as a patient himself.  He is referred in consultation by Dr. Laurance Flatten for marked sinus bradycardia and left bundle branch block.  He has a long-standing history of mild bradycardia going back for many years. His electrocardiogram on August 27 showed a sinus rate of only 41 beats per minute. He has a very long first degree AV block at approximately 300 ms as well as a broad left bundle branch block with a QRS duration of 166 ms.  He does describe fatigue, but this has not changed in a very long time. He denies any problems with chest discomfort, exertional dyspnea, palpitations, near syncope or syncope. He has noted some mild ankle swelling recently, only towards the end of the day.  He has a long-standing history of systemic hypertension and hyperlipidemia and type 2 diabetes mellitus all appropriate he addressed with pharmacological therapy.   NMR profile in August showed total cholesterol 143, LDL cholesterol 78, HDL cholesterol 35, HDL particle #24, LDL particle #662, small LDL particle less than 90, triglycerides 108. Excellent hemoglobin A1c at 5.5%.  He was told that his ago that he has hematuria and mildly abnormal renal function. His most recent creatinine was 1.69 mg/dL, corresponding to a GFR of roughly 40/minute. As far as I can tell this has waxed and waned slightly, and generally has been pretty stable. He was recently referred to Dr. Mercy Moore. He has not seen them yet. Has had numerous urological evaluations that have not identified a cause for hematuria.   No Known Allergies  Current Outpatient Prescriptions  Medication Sig Dispense Refill  . amLODipine (NORVASC) 5 MG tablet TAKE  (1)   TABLET TWICE A DAY.  60 tablet  5  . benazepril (LOTENSIN) 20 MG tablet TAKE  (1)  TABLET TWICE A DAY.  60 tablet  5  . BENICAR HCT 40-25 MG per tablet Take 1 tablet by mouth daily.  30 tablet  5  . bimatoprost (LUMIGAN) 0.03 % ophthalmic solution 1 drop at bedtime.      Marland Kitchen glimepiride (AMARYL) 4 MG tablet Take 1 tablet (4 mg total) by mouth daily.  30 tablet  0  . niacin (NIASPAN) 500 MG CR tablet Take 500 mg by mouth 2 (two) times daily.      . pioglitazone (ACTOS) 30 MG tablet Take 1 tablet (30 mg total) by mouth daily.  30 tablet  11  . potassium chloride SA (K-DUR,KLOR-CON) 20 MEQ tablet TAKE 1 TABLET DAILY  30 tablet  11   No current facility-administered medications for this visit.    Past Medical History  Diagnosis Date  . Hypertension   . Hyperlipidemia   . Diabetes mellitus without complication     Past Surgical History  Procedure Laterality Date  . Rotator cuff repair    . 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 Eye surgery    . Mouth surgery      Family History  Problem Relation Age of Onset  . Kidney disease Mother   . Heart disease Father     History   Social History  . Marital Status: Married    Spouse Name: N/A  Number of Children: N/A  . Years of Education: N/A   Occupational History  . Not on file.   Social History Main Topics  . Smoking status: Current Some Day Smoker -- 0.10 packs/day    Types: Cigarettes  . Smokeless tobacco: Not on file  . Alcohol Use: No  . Drug Use: No  . Sexual Activity: Not on file   Other Topics Concern  . Not on file   Social History Narrative  . No narrative on file    Review of systems: The patient specifically denies any chest pain at rest or with exertion, dyspnea at rest or with exertion, orthopnea, paroxysmal nocturnal dyspnea, syncope, palpitations, focal neurological deficits, intermittent claudication, unexplained weight gain, cough, hemoptysis or wheezing.  The patient also denies abdominal pain, nausea,  vomiting, dysphagia, diarrhea, constipation, polyuria, polydipsia, dysuria, hematuria, frequency, urgency, abnormal bleeding or bruising, fever, chills, unexpected weight changes, mood swings, change in skin or hair texture, change in voice quality, auditory or visual problems, allergic reactions or rashes, new musculoskeletal complaints other than usual "aches and pains".   PHYSICAL EXAM BP 158/69  Pulse 45  Ht 6' (1.829 m)  Wt 90.583 kg (199 lb 11.2 oz)  BMI 27.08 kg/m2  General: Alert, oriented x3, no distress Head: no evidence of trauma, PERRL, EOMI, no exophtalmos or lid lag, no myxedema, no xanthelasma; normal ears, nose and oropharynx Neck: normal jugular venous pulsations and no hepatojugular reflux; brisk carotid pulses without delay and no carotid bruits Chest: clear to auscultation, no signs of consolidation by percussion or palpation, normal fremitus, symmetrical and full respiratory excursions Cardiovascular: normal position and quality of the apical impulse, markedly slow but regular rhythm, normal first and paradoxically split second heart sounds, no murmurs, rubs or gallops Abdomen: no tenderness or distention, no masses by palpation, no abnormal pulsatility or arterial bruits, normal bowel sounds, no hepatosplenomegaly Extremities: no clubbing, cyanosis or edema; 2+ radial, ulnar and brachial pulses bilaterally; 2+ right femoral, posterior tibial and dorsalis pedis pulses; 2+ left femoral, posterior tibial and dorsalis pedis pulses; no subclavian or femoral bruits Neurological: grossly nonfocal   EKG: Sinus bradycardia, very long first degree AV block (roughly 300 ms), typical left bundle branch block (QRS 166 ms)  Lipid Panel     Component Value Date/Time   CHOL 140 07/06/2014 0936   TRIG 108 07/06/2014 0936   TRIG 139 09/05/2013 1420   HDL 35* 07/06/2014 0936   HDL 37* 09/05/2013 1420   CHOLHDL 3.9 09/05/2013 1420   LDLCALC 83 07/06/2014 0936   LDLCALC 78 09/05/2013  1420    BMET    Component Value Date/Time   NA 140 07/13/2014 1541   K 3.9 07/13/2014 1541   CL 99 07/13/2014 1541   CO2 24 07/13/2014 1541   GLUCOSE 72 07/13/2014 1541   BUN 27 07/13/2014 1541   CREATININE 1.69* 07/13/2014 1541   CALCIUM 9.3 07/13/2014 1541   GFRNONAA 37* 07/13/2014 1541   GFRAA 43* 07/13/2014 1541     ASSESSMENT AND PLAN  Mr. Stroot has extensive evidence of conduction system disease involving both the sinoatrial node and the A-V node and intraventricular conduction system. This is not associated with significant symptoms, other than fatigue.  At this point pacemaker implantation is not mandatory, although I think he would feel better with a rate response pacemaker. Asked him to call right away if he develops symptoms of near syncope or syncope, in which case he should immediately receive a pacemaker.  I  recommend stopping any negative inotropic drugs. He will stop his metoprolol which is already in a tiny dose. Avoid starting you negative chronotropic medications, I would even avoid beta blocker containing eyedrops for glaucoma. If his blood pressure increases, there is room to increase his calcium channel blocker.  He has numerous coronary risk factors, but all of these are extremely well taken care of with his current medical regimen. His blood pressure is slightly high today but this is unusual for him. His typical systolic blood pressures in the 130s.  He appears to have moderate chronic kidney disease, stage III and is due to see Dr. Mercy Moore soon. Since his diabetes and hypertension have been well controlled and he reportedly has persistent hematuria, it is possible that he has some underlying nonprogressive glomerulonephritis. Avoid nephrotoxic agents.  His mild ankle swelling could well be a side effect of treatment with Actos and amlodipine.  Holli Humbles, MD, [place] [phone] office [phone] pager

## 2014-08-31 NOTE — Patient Instructions (Signed)
STOP METOPROLOL.  Dr. Sallyanne Kuster recommends that you schedule a follow-up appointment in: 3 MONTHS.

## 2014-09-01 DIAGNOSIS — H3531 Nonexudative age-related macular degeneration: Secondary | ICD-10-CM | POA: Diagnosis not present

## 2014-10-05 DIAGNOSIS — I10 Essential (primary) hypertension: Secondary | ICD-10-CM | POA: Diagnosis not present

## 2014-10-05 DIAGNOSIS — N183 Chronic kidney disease, stage 3 (moderate): Secondary | ICD-10-CM | POA: Diagnosis not present

## 2014-10-05 DIAGNOSIS — R809 Proteinuria, unspecified: Secondary | ICD-10-CM | POA: Diagnosis not present

## 2014-10-05 DIAGNOSIS — R312 Other microscopic hematuria: Secondary | ICD-10-CM | POA: Diagnosis not present

## 2014-11-23 ENCOUNTER — Ambulatory Visit: Payer: Medicare Other | Admitting: Family Medicine

## 2014-11-29 ENCOUNTER — Ambulatory Visit: Payer: BLUE CROSS/BLUE SHIELD | Admitting: Cardiovascular Disease

## 2014-12-04 ENCOUNTER — Other Ambulatory Visit: Payer: Self-pay | Admitting: Family Medicine

## 2014-12-05 ENCOUNTER — Telehealth: Payer: Self-pay | Admitting: Family Medicine

## 2014-12-05 NOTE — Telephone Encounter (Signed)
Fixed appt

## 2014-12-22 ENCOUNTER — Ambulatory Visit (INDEPENDENT_AMBULATORY_CARE_PROVIDER_SITE_OTHER): Payer: PPO | Admitting: Family Medicine

## 2014-12-22 ENCOUNTER — Encounter: Payer: Self-pay | Admitting: Family Medicine

## 2014-12-22 VITALS — BP 147/73 | HR 68 | Temp 96.1°F | Ht 72.0 in | Wt 202.0 lb

## 2014-12-22 DIAGNOSIS — I10 Essential (primary) hypertension: Secondary | ICD-10-CM

## 2014-12-22 DIAGNOSIS — N189 Chronic kidney disease, unspecified: Secondary | ICD-10-CM

## 2014-12-22 DIAGNOSIS — H6123 Impacted cerumen, bilateral: Secondary | ICD-10-CM

## 2014-12-22 DIAGNOSIS — E119 Type 2 diabetes mellitus without complications: Secondary | ICD-10-CM

## 2014-12-22 DIAGNOSIS — D631 Anemia in chronic kidney disease: Secondary | ICD-10-CM

## 2014-12-22 NOTE — Patient Instructions (Addendum)
Medicare Annual Wellness Visit  [facility] and the medical providers at [facility] strive to bring you the best medical care.  In doing so we not only want to address your current medical conditions and concerns but also to detect new conditions early and prevent illness, disease and health-related problems.    Medicare offers a yearly Wellness Visit which allows our clinical staff to assess your need for preventative services including immunizations, lifestyle education, counseling to decrease risk of preventable diseases and screening for fall risk and other medical concerns.    This visit is provided free of charge (no copay) for all Medicare recipients. The clinical pharmacists at [facility] have begun to conduct these Wellness Visits which will also include a thorough review of all your medications.    As you primary medical provider recommend that you make an appointment for your Annual Wellness Visit if you have not done so already this year.  You may set up this appointment before you leave today or you may call back ([phone]) and schedule an appointment.  Please make sure when you call that you mention that you are scheduling your Annual Wellness Visit with the clinical pharmacist so that the appointment may be made for the proper length of time.     Continue current medications. Continue good therapeutic lifestyle changes which include good diet and exercise. Fall precautions discussed with patient. If an FOBT was given today- please return it to our front desk. If you are over 7 years old - you may need Prevnar 38 or the adult Pneumonia vaccine.  Flu Shots are still available at our office. If you still haven't had one please call to set up a nurse visit to get one.   After your visit with [place] today you will receive a survey in the mail or online from [employer] regarding your care with [place]. Please take a moment to  fill this out. Your feedback is very important to [place] as you can help [place] better understand your patient needs as well as improve your experience and satisfaction. WE CARE ABOUT YOU!!!   Please try to check some blood sugars at home in addition to blood pressure readings. Bring these readings for review at each visit Please return the FOBT and return to the office for fasting lab work Also Debrox eardrops over-the-counter will help soften earwax----if you use these drops nightly for several nights in each year when you come back for your lab work we can irrigate the cerumen out of your ears. Keep the house as cool as possible and drink is much fluid as possible and use Mucinex as needed for cough and congestion

## 2014-12-22 NOTE — Progress Notes (Signed)
Subjective:    Patient ID: Ruben Conner, male    DOB: [birth year], 81 y.o.   MRN: [id number]  HPI Pt here for follow up and management of chronic medical problems which include hypertension and diabetes. He is taking medications regularly. Both the patient and his wife have had recent URIs and they are both getting better from this. The patient brings in blood pressures and pulse rates for review and the majority of these are good. They range from the 120s over the 60s to the 140s over the 70s. Pulse rates are generally in the 60-80 range. The patient is not fasting today and he will return to the clinic for fasting lab work.        Patient Active Problem List   Diagnosis Date Noted  . Sinus bradycardia 08/31/2014  . First degree AV block 08/31/2014  . LBBB (left bundle branch block) 08/31/2014  . HTN (hypertension) 09/20/2013  . Diabetes 05/02/2013  . Other and unspecified hyperlipidemia 05/02/2013   Outpatient Encounter Prescriptions as of 12/22/2014  Medication Sig  . amLODipine (NORVASC) 5 MG tablet TAKE (1) TABLET TWICE A DAY.  . benazepril (LOTENSIN) 20 MG tablet TAKE (1) TABLET TWICE A DAY.  . bimatoprost (LUMIGAN) 0.03 % ophthalmic solution 1 drop at bedtime.  Marland Kitchen glimepiride (AMARYL) 4 MG tablet TAKE 1 TABLET DAILY  . hydrochlorothiazide (HYDRODIURIL) 25 MG tablet   . niacin (NIASPAN) 500 MG CR tablet Take 500 mg by mouth 2 (two) times daily.  . pioglitazone (ACTOS) 30 MG tablet Take 1 tablet (30 mg total) by mouth daily.  . potassium chloride SA (K-DUR,KLOR-CON) 20 MEQ tablet TAKE 1 TABLET DAILY  . [DISCONTINUED] BENICAR HCT 40-25 MG per tablet Take 1 tablet by mouth daily.  . [DISCONTINUED] glimepiride (AMARYL) 4 MG tablet Take 1 tablet (4 mg total) by mouth daily.    Review of Systems  Constitutional: Negative.   HENT: Negative.   Eyes: Negative.   Respiratory: Negative.   Cardiovascular: Negative.   Gastrointestinal: Negative.   Endocrine: Negative.     Genitourinary: Negative.   Musculoskeletal: Negative.   Skin: Negative.   Allergic/Immunologic: Negative.   Neurological: Negative.   Hematological: Negative.   Psychiatric/Behavioral: Negative.        Objective:   Physical Exam  Constitutional: He is oriented to person, place, and time. He appears well-developed and well-nourished. No distress.  HENT:  Head: Normocephalic and atraumatic.  Nose: Nose normal.  Mouth/Throat: Oropharynx is clear and moist. No oropharyngeal exudate.  Ears cerumen bilaterally  Eyes: Conjunctivae and EOM are normal. Pupils are equal, round, and reactive to light. Right eye exhibits no discharge. Left eye exhibits no discharge. No scleral icterus.  Neck: Normal range of motion. Neck supple. No thyromegaly present.  Cardiovascular: Normal rate, regular rhythm, normal heart sounds and intact distal pulses.  Exam reveals no gallop and no friction rub.   No murmur heard. The heart has a regular rate and rhythm at 72/m  Pulmonary/Chest: Effort normal and breath sounds normal. No respiratory distress. He has no wheezes. He has no rales. He exhibits no tenderness.  Dry cough  Abdominal: Soft. Bowel sounds are normal. He exhibits no mass. There is no tenderness. There is no rebound and no guarding.  Musculoskeletal: Normal range of motion. He exhibits no edema or tenderness.  Lymphadenopathy:    He has no cervical adenopathy.  Neurological: He is alert and oriented to person, place, and time. He has normal reflexes. No cranial nerve  deficit.  Skin: Skin is warm and dry. No rash noted. No erythema. No pallor.  Psychiatric: He has a normal mood and affect. His behavior is normal. Judgment and thought content normal.  Nursing note and vitals reviewed.   BP 147/73 mmHg  Pulse 68  Temp(Src) 96.1 F (35.6 C) (Oral)  Ht 6' (1.829 m)  Wt 202 lb (91.627 kg)  BMI 27.39 kg/m2       Assessment & Plan:  1. Type 2 diabetes mellitus without complication -Continue  current treatment and any adjustments in medication will be made after your A1c is returned  2. Essential hypertension -Blood pressure is slightly elevated today. But home readings are good. Continue current treatment and continue to watch sodium intake  3. Anemia in chronic kidney disease -We will monitor your blood count with the lab work that is pending.  4. CKD (chronic kidney disease), unspecified stage -We will continue to monitor the kidney function with lab work which is to be drawn.  5. Impacted cerumen of both ears -Use earwax softener and when patient returns for labwork here irrigation can be performed  Meds ordered this encounter  Medications  . hydrochlorothiazide (HYDRODIURIL) 25 MG tablet    Sig:    Patient Instructions                       Medicare Annual Wellness Visit  [facility] and the medical providers at [facility] strive to bring you the best medical care.  In doing so we not only want to address your current medical conditions and concerns but also to detect new conditions early and prevent illness, disease and health-related problems.    Medicare offers a yearly Wellness Visit which allows our clinical staff to assess your need for preventative services including immunizations, lifestyle education, counseling to decrease risk of preventable diseases and screening for fall risk and other medical concerns.    This visit is provided free of charge (no copay) for all Medicare recipients. The clinical pharmacists at [facility] have begun to conduct these Wellness Visits which will also include a thorough review of all your medications.    As you primary medical provider recommend that you make an appointment for your Annual Wellness Visit if you have not done so already this year.  You may set up this appointment before you leave today or you may call back ([phone]) and schedule an appointment.  Please make sure when  you call that you mention that you are scheduling your Annual Wellness Visit with the clinical pharmacist so that the appointment may be made for the proper length of time.     Continue current medications. Continue good therapeutic lifestyle changes which include good diet and exercise. Fall precautions discussed with patient. If an FOBT was given today- please return it to our front desk. If you are over 61 years old - you may need Prevnar 74 or the adult Pneumonia vaccine.  Flu Shots are still available at our office. If you still haven't had one please call to set up a nurse visit to get one.   After your visit with [place] today you will receive a survey in the mail or online from [employer] regarding your care with [place]. Please take a moment to fill this out. Your feedback is very important to [place] as you can help [place] better understand your patient needs as well as improve your experience and satisfaction. WE CARE  ABOUT YOU!!!   Please try to check some blood sugars at home in addition to blood pressure readings. Bring these readings for review at each visit Please return the FOBT and return to the office for fasting lab work Also Debrox eardrops over-the-counter will help soften earwax----if you use these drops nightly for several nights in each year when you come back for your lab work we can irrigate the cerumen out of your ears. Keep the house as cool as possible and drink is much fluid as possible and use Mucinex as needed for cough and congestion   Arrie Senate MD

## 2014-12-26 ENCOUNTER — Other Ambulatory Visit: Payer: Medicare Other

## 2014-12-26 DIAGNOSIS — Z1212 Encounter for screening for malignant neoplasm of rectum: Secondary | ICD-10-CM

## 2014-12-26 NOTE — Progress Notes (Signed)
Lab only 

## 2014-12-27 ENCOUNTER — Other Ambulatory Visit: Payer: Self-pay | Admitting: *Deleted

## 2014-12-27 DIAGNOSIS — L989 Disorder of the skin and subcutaneous tissue, unspecified: Secondary | ICD-10-CM

## 2014-12-27 NOTE — Addendum Note (Signed)
Addended by: Zannie Cove on: 12/27/2014 05:11 PM   Modules accepted: Orders

## 2014-12-28 LAB — FECAL OCCULT BLOOD, IMMUNOCHEMICAL: Fecal Occult Bld: POSITIVE — AB

## 2015-01-04 ENCOUNTER — Other Ambulatory Visit: Payer: Self-pay | Admitting: Family Medicine

## 2015-01-04 ENCOUNTER — Other Ambulatory Visit: Payer: PPO

## 2015-01-04 NOTE — Progress Notes (Signed)
No labs need today

## 2015-01-04 NOTE — Telephone Encounter (Signed)
OK to fill to next appt per Dr. Laurance Flatten, verbal

## 2015-01-12 ENCOUNTER — Other Ambulatory Visit (INDEPENDENT_AMBULATORY_CARE_PROVIDER_SITE_OTHER): Payer: PPO

## 2015-01-12 DIAGNOSIS — R195 Other fecal abnormalities: Secondary | ICD-10-CM

## 2015-01-12 DIAGNOSIS — R5383 Other fatigue: Secondary | ICD-10-CM

## 2015-01-12 DIAGNOSIS — Z1212 Encounter for screening for malignant neoplasm of rectum: Secondary | ICD-10-CM

## 2015-01-12 LAB — POCT CBC
Granulocyte percent: 73.2 %G (ref 37–80)
HEMATOCRIT: 42.6 % — AB (ref 43.5–53.7)
HEMOGLOBIN: 13.3 g/dL — AB (ref 14.1–18.1)
LYMPH, POC: 2.1 (ref 0.6–3.4)
MCH: 27.6 pg (ref 27–31.2)
MCHC: 31.3 g/dL — AB (ref 31.8–35.4)
MCV: 88.3 fL (ref 80–97)
MPV: 8.2 fL (ref 0–99.8)
POC Granulocyte: 6.7 (ref 2–6.9)
POC LYMPH %: 23.2 % (ref 10–50)
Platelet Count, POC: 193 10*3/uL (ref 142–424)
RBC: 4.83 M/uL (ref 4.69–6.13)
RDW, POC: 13.3 %
WBC: 9.1 10*3/uL (ref 4.6–10.2)

## 2015-01-12 NOTE — Progress Notes (Signed)
Lab only 

## 2015-01-14 LAB — FECAL OCCULT BLOOD, IMMUNOCHEMICAL: FECAL OCCULT BLD: POSITIVE — AB

## 2015-01-15 NOTE — Addendum Note (Signed)
Addended by: Zannie Cove on: 01/15/2015 10:25 AM   Modules accepted: Orders

## 2015-01-19 ENCOUNTER — Encounter: Payer: Self-pay | Admitting: Physician Assistant

## 2015-01-19 ENCOUNTER — Ambulatory Visit (INDEPENDENT_AMBULATORY_CARE_PROVIDER_SITE_OTHER): Payer: PPO | Admitting: Physician Assistant

## 2015-01-19 VITALS — BP 152/70 | HR 72 | Ht 72.0 in | Wt 203.0 lb

## 2015-01-19 DIAGNOSIS — Z86010 Personal history of colonic polyps: Secondary | ICD-10-CM

## 2015-01-19 DIAGNOSIS — R195 Other fecal abnormalities: Secondary | ICD-10-CM

## 2015-01-19 NOTE — Patient Instructions (Signed)
You have been scheduled for a colonoscopy. Please follow written instructions given to you at your visit today.  Please pick up your prep supplies at the pharmacy. If you use inhalers (even only as needed), please bring them with you on the day of your procedure.   I appreciate the opportunity to care for you.

## 2015-01-19 NOTE — Progress Notes (Signed)
Patient ID: Ruben Conner, male   DOB: 1929/12/11, 81 y.o.   MRN: [id number]    HPI:  Ruben Conner is a 81 y.o.   male  referred by Chipper Herb, MD for evaluation due to heme positive stools.  Ruben Conner has a history of hypertension, sinus bradycardia, first-degree AV block, and a left bundle branch block as well as diabetes, chronic kidney disease stage III, and hyperlipidemia. He last had a colonoscopy by Dr. Fuller Plan on 10/28/2009 at which time a 5 mm sessile polyp in the descending colon was removed as well as a 4 mm sessile polyp in the sigmoid colon. He was also noted to have mild diverticulosis in the sigmoid colon. Pathology revealed his polyps to be adenomatous and he initially was advised to have a surveillance colonoscopy in 5 years. However, in September 2015 he received a letter in the mail stating that due to his age surveillance was not necessary unless he developed any problems.  Patient states that over the past 2 months his stools have been erratic and he will have days of formed stools then skip several days and have a loose stool and then start the pattern over. He has no abdominal pain. His appetite has been good and his weight has been stable he's had no bright red blood per rectum or melena. He was recently seen for a routine exam with his primary care provider and found to have 2 out of 3 stool cards positive for blood.   Past Medical History  Diagnosis Date  . Hypertension   . Hyperlipidemia   . Diabetes mellitus without complication   . Irregular heart rate     Past Surgical History  Procedure Laterality Date  . Rotator cuff repair    . 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 Eye surgery    . Mouth surgery     Family History  Problem Relation Age of Onset  . Kidney disease Mother   . Heart disease Father    History  Substance Use Topics  . Smoking status: Current Some Day Smoker -- 0.10 packs/day    Types: Cigarettes  . Smokeless tobacco: Not on file  . Alcohol  Use: No   Current Outpatient Prescriptions  Medication Sig Dispense Refill  . amLODipine (NORVASC) 5 MG tablet TAKE (1) TABLET TWICE A DAY. 60 tablet 1  . benazepril (LOTENSIN) 20 MG tablet TAKE (1) TABLET TWICE A DAY. 60 tablet 1  . bimatoprost (LUMIGAN) 0.03 % ophthalmic solution 1 drop at bedtime.    Marland Kitchen glimepiride (AMARYL) 4 MG tablet TAKE 1 TABLET DAILY 30 tablet 3  . hydrochlorothiazide (HYDRODIURIL) 25 MG tablet     . niacin (NIASPAN) 500 MG CR tablet Take 500 mg by mouth 2 (two) times daily.    . pioglitazone (ACTOS) 30 MG tablet Take 1 tablet (30 mg total) by mouth daily. 30 tablet 11  . potassium chloride SA (K-DUR,KLOR-CON) 20 MEQ tablet TAKE 1 TABLET DAILY 30 tablet 11   No current facility-administered medications for this visit.   No Known Allergies   Review of Systems: Gen: Denies any fever, chills, sweats, anorexia, fatigue, weakness, malaise, weight loss, and sleep disorder CV: Denies chest pain, angina, palpitations, syncope, orthopnea, PND, peripheral edema, and claudication. Resp: Denies dyspnea at rest, dyspnea with exercise, cough, sputum, wheezing, coughing up blood, and pleurisy. GI: Denies vomiting blood, jaundice, and fecal incontinence.   Denies dysphagia or odynophagia. GU : Denies urinary burning, blood in  urine, urinary frequency, urinary hesitancy, nocturnal urination, and urinary incontinence. MS: Denies joint pain, limitation of movement, and swelling, stiffness, low back pain, extremity pain. Denies muscle weakness, cramps, atrophy.  Derm: Denies rash, itching, dry skin, hives, moles, warts, or unhealing ulcers.  Psych: Denies depression, anxiety, memory loss, suicidal ideation, hallucinations, paranoia, and confusion. Heme: Denies bruising, bleeding, and enlarged lymph nodes. Neuro:  Denies any headaches, dizziness, paresthesias. Endo:  Denies any problems with DM, thyroid, adrenal function  LAB RESULTS: CBC on 01/12/2015 at a white blood cell count  9.1, hemoglobin 13.3, hematocrit 42.6, platelets 193,000.  Prior Endoscopies:  See HPI  Physical Exam: BP 152/70 mmHg  Pulse 72  Ht 6' (1.829 m)  Wt 203 lb (92.08 kg)  BMI 27.53 kg/m2 Constitutional: Pleasant,well-developed male in no acute distress. HEENT: Normocephalic and atraumatic. Conjunctivae are normal. No scleral icterus. Neck supple.no thyromegaly  Cardiovascular: Normal rate, regular rhythm.  Pulmonary/chest: Effort normal and breath sounds normal. No wheezing, rales or rhonchi. Abdominal: Soft, nondistended, nontender. Bowel sounds active throughout. There are no masses palpable. No hepatomegaly. Extremities: no edema Lymphadenopathy: No cervical adenopathy noted. Neurological: Alert and oriented to person place and time. Skin: Skin is warm and dry. No rashes noted. Psychiatric: Normal mood and affect. Behavior is normal.  ASSESSMENT AND PLAN: 81 year old male with a personal history of adenomatous colon polyps recently found to heal heme positive stools referred for evaluation. He has been advised to undergo colonoscopy to evaluate for polyps, neoplasia or AVMs, internal hemorrhoids or other possible etiologies for his heme positive stools.The risks, benefits, and alternatives to colonoscopy with possible biopsy and possible polypectomy were discussed with the patient and they consent to proceed. The procedure will be scheduled with Dr. Fuller Plan. In the meantime, patient has been advised to use a fiber supplement such as Benefiber to try to regulate his stools.    Ruben Conner, Ruben Conner 01/19/2015, 3:21 PM  CC: Chipper Herb, MD

## 2015-01-20 NOTE — Progress Notes (Signed)
Reviewed and agree with management plan.  Savannah Erbe T. Takela Varden, MD FACG 

## 2015-01-24 ENCOUNTER — Ambulatory Visit: Payer: BLUE CROSS/BLUE SHIELD | Admitting: Cardiovascular Disease

## 2015-01-31 ENCOUNTER — Encounter: Payer: Self-pay | Admitting: Gastroenterology

## 2015-01-31 ENCOUNTER — Ambulatory Visit (AMBULATORY_SURGERY_CENTER): Payer: PPO | Admitting: Gastroenterology

## 2015-01-31 VITALS — BP 148/84 | HR 56 | Temp 96.0°F | Resp 18 | Ht 72.0 in | Wt 203.0 lb

## 2015-01-31 DIAGNOSIS — K635 Polyp of colon: Secondary | ICD-10-CM | POA: Diagnosis not present

## 2015-01-31 DIAGNOSIS — R195 Other fecal abnormalities: Secondary | ICD-10-CM | POA: Diagnosis not present

## 2015-01-31 DIAGNOSIS — Z86010 Personal history of colonic polyps: Secondary | ICD-10-CM

## 2015-01-31 DIAGNOSIS — D125 Benign neoplasm of sigmoid colon: Secondary | ICD-10-CM

## 2015-01-31 LAB — GLUCOSE, CAPILLARY
GLUCOSE-CAPILLARY: 80 mg/dL (ref 70–99)
Glucose-Capillary: 109 mg/dL — ABNORMAL HIGH (ref 70–99)

## 2015-01-31 MED ORDER — SODIUM CHLORIDE 0.9 % IV SOLN: 500.0000 mL | INTRAVENOUS | Status: DC

## 2015-01-31 NOTE — Patient Instructions (Signed)
Colon polyps removed today, AVM's, diverticulosis and hemorrhoids seen. Hold aspirin and ALL other NSAIDS for 2 weeks. Try to follow high fiber diet. Handouts given: polyps,diverticulosis,hemorrhoids,and high fiber diet. Call us with any questions or concerns. Thank you!   YOU HAD AN ENDOSCOPIC PROCEDURE TODAY AT [facility]:   Refer to the procedure report that was given to you for any specific questions about what was found during the examination.  If the procedure report does not answer your questions, please call your gastroenterologist to clarify.  If you requested that your care partner not be given the details of your procedure findings, then the procedure report has been included in a sealed envelope for you to review at your convenience later.  YOU SHOULD EXPECT: Some feelings of bloating in the abdomen. Passage of more gas than usual.  Walking can help get rid of the air that was put into your GI tract during the procedure and reduce the bloating. If you had a lower endoscopy (such as a colonoscopy or flexible sigmoidoscopy) you may notice spotting of blood in your stool or on the toilet paper. If you underwent a bowel prep for your procedure, you may not have a normal bowel movement for a few days.  Please Note:  You might notice some irritation and congestion in your nose or some drainage.  This is from the oxygen used during your procedure.  There is no need for concern and it should clear up in a day or so.  SYMPTOMS TO REPORT IMMEDIATELY:   Following lower endoscopy (colonoscopy or flexible sigmoidoscopy):  Excessive amounts of blood in the stool  Significant tenderness or worsening of abdominal pains  Swelling of the abdomen that is new, acute  Fever of 100F or higher   Following upper endoscopy (EGD)  Vomiting of blood or coffee ground material  New chest pain or pain under the shoulder blades  Painful or persistently difficult swallowing  New shortness of  breath  Fever of 100F or higher  Black, tarry-looking stools  For urgent or emergent issues, a gastroenterologist can be reached at any hour by calling [phone].   DIET: Your first meal following the procedure should be a small meal and then it is ok to progress to your normal diet. Heavy or fried foods are harder to digest and may make you feel nauseous or bloated.  Likewise, meals heavy in dairy and vegetables can increase bloating.  Drink plenty of fluids but you should avoid alcoholic beverages for 24 hours.  ACTIVITY:  You should plan to take it easy for the rest of today and you should NOT DRIVE or use heavy machinery until tomorrow (because of the sedation medicines used during the test).    FOLLOW UP: Our staff will call the number listed on your records the next business day following your procedure to check on you and address any questions or concerns that you may have regarding the information given to you following your procedure. If we do not reach you, we will leave a message.  However, if you are feeling well and you are not experiencing any problems, there is no need to return our call.  We will assume that you have returned to your regular daily activities without incident.  If any biopsies were taken you will be contacted by phone or by letter within the next 1-3 weeks.  Please call us at [phone] if you have not heard about the biopsies in 3 weeks.  SIGNATURES/CONFIDENTIALITY: You and/or your care partner have signed paperwork which will be entered into your electronic medical record.  These signatures attest to the fact that that the information above on your After Visit Summary has been reviewed and is understood.  Full responsibility of the confidentiality of this discharge information lies with you and/or your care-partner.

## 2015-01-31 NOTE — Op Note (Signed)
[facility]  [address] [place], [id number]   COLONOSCOPY PROCEDURE REPORT PATIENT: Ruben Conner, Ruben Conner  MR#: [id number] BIRTHDATE: July 29, 1930 , 84  yrs. old GENDER: male ENDOSCOPIST: Ladene Artist, MD, [facility] REFERRED VV:OHYWVP Laurance Flatten, M.D. PROCEDURE DATE:  01/31/2015 PROCEDURE:   Colonoscopy, diagnostic and Colonoscopy with snare polypectomy First Screening Colonoscopy - Avg.  risk and is 50 yrs.  old or older - No.  Prior Negative Screening - Now for repeat screening. N/A  History of Adenoma - Now for follow-up colonoscopy & has been > or = to 3 yrs.  Yes hx of adenoma.  Has been 3 or more years since last colonoscopy. ASA CLASS:   Class III INDICATIONS:Evaluation of unexplained GI bleeding, PH Colon Adenoma, and heme-positive stool. MEDICATIONS: Monitored anesthesia care and Propofol 300 mg IV DESCRIPTION OF PROCEDURE:   After the risks benefits and alternatives of the procedure were thoroughly explained, informed consent was obtained.  The digital rectal exam revealed no abnormalities of the rectum.   The LB [id number]  endoscope was introduced through the anus and advanced to the cecum, which was identified by both the appendix and ileocecal valve. No adverse events experienced.   The quality of the prep was good.  (MoviPrep was used)  The instrument was then slowly withdrawn as the colon was fully examined.  COLON FINDINGS: Three arteriovenous malformations ranging between 3-80mm in size were found at the cecum.   Four sessile polyps ranging between 5-70mm in size were found in the sigmoid colon. Polypectomies were performed with a cold snare.  The resection was complete, polyp tissue was completely retrieved and 3 sent to histology.   There was moderate diverticulosis noted in the descending colon and sigmoid colon.   The examination was otherwise normal.  Retroflexed views revealed internal Grade I hemorrhoids. The time to cecum = 4.5 Withdrawal time  = 18.7   The scope was withdrawn and the procedure completed. COMPLICATIONS: There were no immediate complications.  ENDOSCOPIC IMPRESSION: 1.   Three arteriovenous malformations 3-44mm at the cecum 2.   Four sessile polyps 3-22mm in the sigmoid colon; polypectomies performed with a cold snare 3.   Moderate diverticulosis in the descending colon and sigmoid colon 4.   Grade l internal hemorrhoids  RECOMMENDATIONS: 1.  Hold Aspirin and all other NSAIDS for 2 weeks. 2.  Await pathology results 3.  High fiber diet with liberal fluid intake. 4.  Given your age, you will not need another colonoscopy or stool testing for occult bleeding for colon cancer screening or polyp surveillance.  These types of tests usually stop around the age 83.   eSigned:  Ladene Artist, MD, [facility] 01/31/2015 9:44 AM [

## 2015-01-31 NOTE — Progress Notes (Signed)
Report to PACU, RN, vss, BBS= Clear.  

## 2015-01-31 NOTE — Progress Notes (Signed)
Called to room to assist during endoscopic procedure.  Patient ID and intended procedure confirmed with present staff. Received instructions for my participation in the procedure from the performing physician.  

## 2015-02-01 ENCOUNTER — Telehealth: Payer: Self-pay

## 2015-02-01 NOTE — Telephone Encounter (Signed)
  Follow up Call-  Call back number 01/31/2015  Post procedure Call Back phone  # [phone]  Permission to leave phone message Yes     Patient questions:  Do you have a fever, pain , or abdominal swelling? No. Pain Score  0 *  Have you tolerated food without any problems? Yes.    Have you been able to return to your normal activities? Yes.    Do you have any questions about your discharge instructions: Diet   No. Medications  No. Follow up visit  No.  Do you have questions or concerns about your Care? No.  Actions: * If pain score is 4 or above: No action needed, pain <4.

## 2015-02-02 ENCOUNTER — Other Ambulatory Visit: Payer: Self-pay | Admitting: Family Medicine

## 2015-02-07 ENCOUNTER — Encounter: Payer: Self-pay | Admitting: Gastroenterology

## 2015-03-01 ENCOUNTER — Ambulatory Visit (INDEPENDENT_AMBULATORY_CARE_PROVIDER_SITE_OTHER): Payer: PPO | Admitting: Cardiovascular Disease

## 2015-03-01 ENCOUNTER — Encounter: Payer: Self-pay | Admitting: Cardiovascular Disease

## 2015-03-01 VITALS — BP 132/60 | HR 48 | Resp 16 | Ht 72.0 in | Wt 204.1 lb

## 2015-03-01 DIAGNOSIS — R001 Bradycardia, unspecified: Secondary | ICD-10-CM | POA: Diagnosis not present

## 2015-03-01 NOTE — Progress Notes (Signed)
Patient ID: Ruben Conner, male   DOB: 05-Apr-1930, 81 y.o.   MRN: [id number]     Cardiology Office Note   Date:  03/04/2015   ID:  Ruben Conner, DOB December 23, 1929, MRN [id number]  PCP:  Redge Gainer, MD  Cardiologist:   Sanda Klein, MD   No chief complaint on file.     History of Present Illness: Ruben Conner is a 81 y.o. male who presents for follow-up for conduction system disease. He is accompanied by his wife Ruben Conner has also outpatient.  He has a long-standing history of bradycardia and has developed a left bundle branch block. He has no history of syncope and had previously denied problems with shortness of breath or fatigue. However he now reports that after discontinuation of metoprolol he felt much better. He felt even better after Benicar was discontinued.  Today his heart rate is only 48 bpm but he feels great.  Additional problems include treated systemic hypertension, hyperlipidemia and type 2 diabetes mellitus all well controlled. He has chronic macroscopic hematuria and moderate chronic kidney disease with a baseline creatinine of 1.7 which has been stable for many years, now seeing Dr. Mercy Moore.  Past Medical History  Diagnosis Date  . Hypertension   . Hyperlipidemia   . Diabetes mellitus without complication   . Irregular heart rate     Past Surgical History  Procedure Laterality Date  . Rotator cuff repair    . 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 Eye surgery    . Mouth surgery       Current Outpatient Prescriptions  Medication Sig Dispense Refill  . amLODipine (NORVASC) 5 MG tablet TAKE (1) TABLET TWICE A DAY. 60 tablet 4  . benazepril (LOTENSIN) 20 MG tablet TAKE (1) TABLET TWICE A DAY. 60 tablet 4  . bimatoprost (LUMIGAN) 0.03 % ophthalmic solution 1 drop at bedtime.    Marland Kitchen glimepiride (AMARYL) 4 MG tablet TAKE 1 TABLET DAILY 30 tablet 3  . hydrochlorothiazide (HYDRODIURIL) 25 MG tablet Take 25 mg by mouth every morning.     . niacin (NIASPAN) 500 MG CR  tablet Take 500 mg by mouth 2 (two) times daily.    . pioglitazone (ACTOS) 30 MG tablet Take 1 tablet (30 mg total) by mouth daily. 30 tablet 11  . potassium chloride SA (K-DUR,KLOR-CON) 20 MEQ tablet TAKE 1 TABLET DAILY 30 tablet 11   No current facility-administered medications for this visit.    Allergies:   Review of patient's allergies indicates no known allergies.    Social History:  The patient  reports that he has been smoking Cigarettes.  He has been smoking about 0.10 packs per day. He does not have any smokeless tobacco history on file. He reports that he does not drink alcohol or use illicit drugs.   Family History:  The patient's family history includes Heart disease in his father; Kidney disease in his mother.    ROS:  Please see the history of present illness.    The patient specifically denies any chest pain at rest exertion, dyspnea at rest or with exertion, orthopnea, paroxysmal nocturnal dyspnea, syncope, palpitations, focal neurological deficits, intermittent claudication, lower extremity edema, unexplained weight gain, cough, hemoptysis or wheezing.    All other systems are reviewed and negative.    PHYSICAL EXAM: VS:  BP 132/60 mmHg  Pulse 48  Resp 16  Ht 6' (1.829 m)  Wt 204 lb 1.6 oz (92.579 kg)  BMI 27.67 kg/m2 , BMI Body  mass index is 27.67 kg/(m^2).  General: Alert, oriented x3, no distress Head: no evidence of trauma, PERRL, EOMI, no exophtalmos or lid lag, no myxedema, no xanthelasma; normal ears, nose and oropharynx Neck: normal jugular venous pulsations and no hepatojugular reflux; brisk carotid pulses without delay and no carotid bruits Chest: clear to auscultation, no signs of consolidation by percussion or palpation, normal fremitus, symmetrical and full respiratory excursions Cardiovascular: normal position and quality of the apical impulse, regular rhythm, normal first and  paradoxically split second heart sounds, no murmurs, rubs or  gallops Abdomen: no tenderness or distention, no masses by palpation, no abnormal pulsatility or arterial bruits, normal bowel sounds, no hepatosplenomegaly Extremities: no clubbing, cyanosis or edema; 2+ radial, ulnar and brachial pulses bilaterally; 2+ right femoral, posterior tibial and dorsalis pedis pulses; 2+ left femoral, posterior tibial and dorsalis pedis pulses; no subclavian or femoral bruits Neurological: grossly nonfocal Psych: euthymic mood, full affect   EKG:  EKG is ordered today. The ekg ordered today shows sinus bradycardia with very long first-degree AV block, PR interval 364 ms, left bundle branch block with QRS duration 168 ms, QTC 468 ms. One blocked premature atrial beat is seen.   Recent Labs: 07/06/2014: ALT 7 07/13/2014: BUN 27; Creatinine 1.69*; Potassium 3.9; Sodium 140 01/12/2015: Hemoglobin 13.3*    Lipid Panel    Component Value Date/Time   CHOL 140 07/06/2014 0936   CHOL 143 09/05/2013 1420   TRIG 108 07/06/2014 0936   TRIG 139 09/05/2013 1420   HDL 35* 07/06/2014 0936   HDL 37* 09/05/2013 1420   CHOLHDL 3.9 09/05/2013 1420   LDLCALC 83 07/06/2014 0936   LDLCALC 78 09/05/2013 1420      Wt Readings from Last 3 Encounters:  03/01/15 204 lb 1.6 oz (92.579 kg)  01/31/15 203 lb (92.08 kg)  01/19/15 203 lb (92.08 kg)     ASSESSMENT AND PLAN:  Ruben Conner has extensive evidence of conduction system disease involving both the sinoatrial node and the A-V node and intraventricular conduction system. This is not associated with significant symptoms, other than fatigue.  At this point pacemaker implantation is not mandatory, although I think he would feel better with a rate response pacemaker. Asked him to call right away if he develops symptoms of near syncope or syncope, in which case he should immediately receive a pacemaker.  At this point he makes it clear that he prefers conservative management.  Avoid all negative chronotropic agents.  Current  medicines are reviewed at length with the patient today.  The patient does not have concerns regarding medicines.  The following changes have been made:  no change  Labs/ tests ordered today include:  Orders Placed This Encounter  Procedures  . EKG 12-Lead   Patient Instructions  Dr. Sallyanne Kuster recommends that you schedule a follow-up appointment in: One year.      Mikael Spray, MD  03/04/2015 2:17 PM    Sanda Klein, MD, [facility] [phone] office [phone] pager

## 2015-03-01 NOTE — Patient Instructions (Signed)
Dr. Croitoru recommends that you schedule a follow-up appointment in: One year.   

## 2015-03-04 ENCOUNTER — Encounter: Payer: Self-pay | Admitting: Cardiovascular Disease

## 2015-04-24 ENCOUNTER — Ambulatory Visit (INDEPENDENT_AMBULATORY_CARE_PROVIDER_SITE_OTHER): Payer: PPO | Admitting: Family Medicine

## 2015-04-24 ENCOUNTER — Encounter: Payer: Self-pay | Admitting: Family Medicine

## 2015-04-24 VITALS — BP 121/69 | HR 62 | Temp 97.8°F | Ht 72.0 in | Wt 203.0 lb

## 2015-04-24 DIAGNOSIS — N189 Chronic kidney disease, unspecified: Secondary | ICD-10-CM

## 2015-04-24 DIAGNOSIS — I10 Essential (primary) hypertension: Secondary | ICD-10-CM

## 2015-04-24 DIAGNOSIS — R001 Bradycardia, unspecified: Secondary | ICD-10-CM

## 2015-04-24 DIAGNOSIS — E119 Type 2 diabetes mellitus without complications: Secondary | ICD-10-CM | POA: Diagnosis not present

## 2015-04-24 DIAGNOSIS — D631 Anemia in chronic kidney disease: Secondary | ICD-10-CM

## 2015-04-24 LAB — POCT CBC
Granulocyte percent: 70.6 %G (ref 37–80)
HCT, POC: 44 % (ref 43.5–53.7)
Hemoglobin: 14 g/dL — AB (ref 14.1–18.1)
LYMPH, POC: 2.4 (ref 0.6–3.4)
MCH: 27.9 pg (ref 27–31.2)
MCHC: 31.9 g/dL (ref 31.8–35.4)
MCV: 87.7 fL (ref 80–97)
MPV: 7.6 fL (ref 0–99.8)
POC Granulocyte: 7.6 — AB (ref 2–6.9)
POC LYMPH PERCENT: 22 %L (ref 10–50)
Platelet Count, POC: 228 10*3/uL (ref 142–424)
RBC: 5.02 M/uL (ref 4.69–6.13)
RDW, POC: 13.1 %
WBC: 10.7 10*3/uL — AB (ref 4.6–10.2)

## 2015-04-24 LAB — POCT GLYCOSYLATED HEMOGLOBIN (HGB A1C): Hemoglobin A1C: 5.7

## 2015-04-24 NOTE — Patient Instructions (Addendum)
Medicare Annual Wellness Visit  [facility] and the medical providers at [facility] strive to bring you the best medical care.  In doing so we not only want to address your current medical conditions and concerns but also to detect new conditions early and prevent illness, disease and health-related problems.    Medicare offers a yearly Wellness Visit which allows our clinical staff to assess your need for preventative services including immunizations, lifestyle education, counseling to decrease risk of preventable diseases and screening for fall risk and other medical concerns.    This visit is provided free of charge (no copay) for all Medicare recipients. The clinical pharmacists at [facility] have begun to conduct these Wellness Visits which will also include a thorough review of all your medications.    As you primary medical provider recommend that you make an appointment for your Annual Wellness Visit if you have not done so already this year.  You may set up this appointment before you leave today or you may call back [phone]) and schedule an appointment.  Please make sure when you call that you mention that you are scheduling your Annual Wellness Visit with the clinical pharmacist so that the appointment may be made for the proper length of time.     Continue current medications. Continue good therapeutic lifestyle changes which include good diet and exercise. Fall precautions discussed with patient. If an FOBT was given today- please return it to our front desk. If you are over 27 years old - you may need Prevnar 22 or the adult Pneumonia vaccine.  Flu Shots are still available at our office. If you still haven't had one please call to set up a nurse visit to get one.   After your visit with [place] today you will receive a survey in the mail or online from [employer] regarding your care with [place]. Please take a moment to  fill this out. Your feedback is very important to [place] as you can help [place] better understand your patient needs as well as improve your experience and satisfaction. WE CARE ABOUT YOU!!!   He should continue follow-up with cardiology as planned He should call us immediately if his pulse rate drops down below and stays low and he develops any severe weakness. He should monitor his radial pulse periodically especially if he feels bad and notify us if the rate is in the low 40s her 30s.

## 2015-04-24 NOTE — Progress Notes (Signed)
Subjective:    Patient ID: Ruben Conner, male    DOB: [birth year], 81 y.o.   MRN: [id number]  HPI Pt here for follow up and management of chronic medical problems which includes hypertension and diabetes. He is taking medications regularly. The patient saw the cardiologist in April and there was a discussion of the rate related pacemaker. The cardiologist thought that if the patient's heart rate was somewhat faster that he would feel better. If he continues to have problems with feeling bad in a slow heart rate he was given the open door to return to further evaluate and manage this. Otherwise today has no complaints with chest pain shortness of breath trouble swallowing GI symptoms or urinary tract problems. The patient does not check his blood sugars that often but they have been running in the low 100 range and under good control according to him.      Patient Active Problem List   Diagnosis Date Noted  . Sinus bradycardia 08/31/2014  . First degree AV block 08/31/2014  . LBBB (left bundle branch block) 08/31/2014  . HTN (hypertension) 09/20/2013  . Diabetes 05/02/2013  . Other and unspecified hyperlipidemia 05/02/2013   Outpatient Encounter Prescriptions as of 04/24/2015  Medication Sig  . amLODipine (NORVASC) 5 MG tablet TAKE (1) TABLET TWICE A DAY.  . benazepril (LOTENSIN) 20 MG tablet TAKE (1) TABLET TWICE A DAY.  . bimatoprost (LUMIGAN) 0.03 % ophthalmic solution 1 drop at bedtime.  Marland Kitchen glimepiride (AMARYL) 4 MG tablet TAKE 1 TABLET DAILY  . hydrochlorothiazide (HYDRODIURIL) 25 MG tablet Take 25 mg by mouth every morning.   . niacin (NIASPAN) 500 MG CR tablet Take 500 mg by mouth 2 (two) times daily.  . pioglitazone (ACTOS) 30 MG tablet Take 1 tablet (30 mg total) by mouth daily.  . potassium chloride SA (K-DUR,KLOR-CON) 20 MEQ tablet TAKE 1 TABLET DAILY   No facility-administered encounter medications on file as of 04/24/2015.      Review of Systems  Constitutional:  Negative.   HENT: Negative.   Eyes: Negative.   Respiratory: Negative.   Cardiovascular: Negative.   Gastrointestinal: Negative.   Endocrine: Negative.   Genitourinary: Negative.   Musculoskeletal: Negative.   Skin: Negative.   Allergic/Immunologic: Negative.   Neurological: Negative.   Hematological: Negative.   Psychiatric/Behavioral: Negative.        Objective:   Physical Exam  Constitutional: He is oriented to person, place, and time. He appears well-developed and well-nourished. No distress.  HENT:  Head: Normocephalic and atraumatic.  Left Ear: External ear normal.  Nose: Nose normal.  Mouth/Throat: Oropharynx is clear and moist. No oropharyngeal exudate.  Cerumen in right ear canal  Eyes: Conjunctivae and EOM are normal. Pupils are equal, round, and reactive to light. Right eye exhibits no discharge. Left eye exhibits no discharge. No scleral icterus.  Neck: Normal range of motion. Neck supple. No thyromegaly present.  Neck without bruits thyromegaly or adenopathy  Cardiovascular: Normal rate, normal heart sounds and intact distal pulses.   No murmur heard. The rhythm is slightly irregular at 60/m  Pulmonary/Chest: Effort normal and breath sounds normal. No respiratory distress. He has no wheezes. He has no rales. He exhibits no tenderness.  Clear anteriorly and posteriorly  Abdominal: Soft. Bowel sounds are normal. He exhibits no mass. There is no tenderness. There is no rebound and no guarding.  No abdominal bruits or masses or organ enlargement  Musculoskeletal: Normal range of motion. He exhibits edema. He exhibits  no tenderness.  There is some slight pedal edema bilaterally  Lymphadenopathy:    He has no cervical adenopathy.  Neurological: He is alert and oriented to person, place, and time. He has normal reflexes. No cranial nerve deficit.  Skin: Skin is warm and dry. No rash noted. No erythema. No pallor.  Psychiatric: He has a normal mood and affect. His  behavior is normal. Judgment and thought content normal.  Nursing note and vitals reviewed.  BP 121/69 mmHg  Pulse 62  Temp(Src) 97.8 F (36.6 C) (Oral)  Ht 6' (1.829 m)  Wt 203 lb (92.08 kg)  BMI 27.53 kg/m2        Assessment & Plan:  1. Type 2 diabetes mellitus without complication -According to the patient's blood sugars at home and been under good control and we will be checking a hemoglobin A1c today. He should continue with his current treatment. - POCT glycosylated hemoglobin (Hb A1C) - POCT CBC - BMP8+EGFR - Lipid panel  2. Essential hypertension -He should continue with current blood pressure medicine pending results of lab work being done today. - POCT CBC - BMP8+EGFR - Hepatic function panel - Lipid panel  3. Anemia in chronic kidney disease -No change in treatment and patient will be notified of his hemoglobin once this is returned from lab work today. - POCT CBC  4. CKD (chronic kidney disease), unspecified stage -The blood pressure is good and he should continue to watch his sodium intake - POCT CBC - BMP8+EGFR  5. Bradycardia -The rate was actually improved slightly today at about 60/m and he will continues to monitor this at home especially if he starts feeling weak or tired  Patient Instructions                       Medicare Annual Wellness Visit  [facility] and the medical providers at [facility] strive to bring you the best medical care.  In doing so we not only want to address your current medical conditions and concerns but also to detect new conditions early and prevent illness, disease and health-related problems.    Medicare offers a yearly Wellness Visit which allows our clinical staff to assess your need for preventative services including immunizations, lifestyle education, counseling to decrease risk of preventable diseases and screening for fall risk and other medical concerns.    This visit is provided free of  charge (no copay) for all Medicare recipients. The clinical pharmacists at [facility] have begun to conduct these Wellness Visits which will also include a thorough review of all your medications.    As you primary medical provider recommend that you make an appointment for your Annual Wellness Visit if you have not done so already this year.  You may set up this appointment before you leave today or you may call back ([phone]) and schedule an appointment.  Please make sure when you call that you mention that you are scheduling your Annual Wellness Visit with the clinical pharmacist so that the appointment may be made for the proper length of time.     Continue current medications. Continue good therapeutic lifestyle changes which include good diet and exercise. Fall precautions discussed with patient. If an FOBT was given today- please return it to our front desk. If you are over 35 years old - you may need Prevnar 39 or the adult Pneumonia vaccine.  Flu Shots are still available at our office. If you  still haven't had one please call to set up a nurse visit to get one.   After your visit with [place] today you will receive a survey in the mail or online from [employer] regarding your care with [place]. Please take a moment to fill this out. Your feedback is very important to [place] as you can help [place] better understand your patient needs as well as improve your experience and satisfaction. WE CARE ABOUT YOU!!!   He should continue follow-up with cardiology as planned He should call us immediately if his pulse rate drops down below and stays low and he develops any severe weakness. He should monitor his radial pulse periodically especially if he feels bad and notify us if the rate is in the low 40s her 30s.   Arrie Senate MD

## 2015-04-25 ENCOUNTER — Telehealth: Payer: Self-pay | Admitting: *Deleted

## 2015-04-25 LAB — BMP8+EGFR
BUN / CREAT RATIO: 12 (ref 10–22)
BUN: 23 mg/dL (ref 8–27)
CHLORIDE: 103 mmol/L (ref 97–108)
CO2: 23 mmol/L (ref 18–29)
CREATININE: 1.88 mg/dL — AB (ref 0.76–1.27)
Calcium: 9.4 mg/dL (ref 8.6–10.2)
GFR calc Af Amer: 38 mL/min/{1.73_m2} — ABNORMAL LOW (ref >59)
GFR calc non Af Amer: 33 mL/min/{1.73_m2} — ABNORMAL LOW (ref >59)
GLUCOSE: 60 mg/dL — AB (ref 65–99)
Potassium: 3.7 mmol/L (ref 3.5–5.2)
SODIUM: 142 mmol/L (ref 134–144)

## 2015-04-25 LAB — LIPID PANEL
CHOLESTEROL TOTAL: 138 mg/dL (ref 100–199)
Chol/HDL Ratio: 4.1 ratio units (ref 0.0–5.0)
HDL: 34 mg/dL — AB (ref >39)
LDL Calculated: 81 mg/dL (ref 0–99)
Triglycerides: 114 mg/dL (ref 0–149)
VLDL CHOLESTEROL CAL: 23 mg/dL (ref 5–40)

## 2015-04-25 LAB — HEPATIC FUNCTION PANEL
ALBUMIN: 4.3 g/dL (ref 3.5–4.7)
ALK PHOS: 71 IU/L (ref 39–117)
ALT: 7 IU/L (ref 0–44)
AST: 10 IU/L (ref 0–40)
BILIRUBIN, DIRECT: 0.14 mg/dL (ref 0.00–0.40)
Bilirubin Total: 0.5 mg/dL (ref 0.0–1.2)
Total Protein: 7 g/dL (ref 6.0–8.5)

## 2015-04-25 NOTE — Telephone Encounter (Signed)
-----   Message from Chipper Herb, MD sent at 04/25/2015  7:37 AM EDT ----- The blood sugar is low at 60. The creatinine, the most important kidney function test remains elevated at 1.88. Previously it was 1.69. The patient should continue to do the best possible at avoiding all NSAIDs and keeping his blood pressure under good control. Please send a copy of this report to his cardiologist and to his nephrologist. The electrolytes including potassium are good All liver function tests are within normal limits Cholesterol numbers with traditional lipid testing are good and at goal and he should continue with his diet and Niaspan

## 2015-04-26 ENCOUNTER — Telehealth: Payer: Self-pay | Admitting: *Deleted

## 2015-04-26 NOTE — Telephone Encounter (Signed)
-----   Message from Chipper Herb, MD sent at 04/25/2015  7:37 AM EDT ----- The blood sugar is low at 60. The creatinine, the most important kidney function test remains elevated at 1.88. Previously it was 1.69. The patient should continue to do the best possible at avoiding all NSAIDs and keeping his blood pressure under good control. Please send a copy of this report to his cardiologist and to his nephrologist. The electrolytes including potassium are good All liver function tests are within normal limits Cholesterol numbers with traditional lipid testing are good and at goal and he should continue with his diet and Niaspan

## 2015-04-26 NOTE — Telephone Encounter (Signed)
Pt notified of results Verbalizes understanding 

## 2015-05-02 ENCOUNTER — Other Ambulatory Visit: Payer: Self-pay | Admitting: Family Medicine

## 2015-06-28 ENCOUNTER — Other Ambulatory Visit: Payer: Self-pay | Admitting: Family Medicine

## 2015-07-30 ENCOUNTER — Other Ambulatory Visit: Payer: Self-pay | Admitting: Family Medicine

## 2015-08-24 ENCOUNTER — Encounter: Payer: Self-pay | Admitting: Family Medicine

## 2015-08-24 ENCOUNTER — Ambulatory Visit (INDEPENDENT_AMBULATORY_CARE_PROVIDER_SITE_OTHER): Payer: PPO | Admitting: Family Medicine

## 2015-08-24 VITALS — BP 129/61 | HR 53 | Temp 97.8°F | Ht 72.0 in | Wt 199.0 lb

## 2015-08-24 DIAGNOSIS — Z0000 Encounter for general adult medical examination without abnormal findings: Secondary | ICD-10-CM

## 2015-08-24 DIAGNOSIS — I10 Essential (primary) hypertension: Secondary | ICD-10-CM

## 2015-08-24 DIAGNOSIS — E119 Type 2 diabetes mellitus without complications: Secondary | ICD-10-CM | POA: Diagnosis not present

## 2015-08-24 DIAGNOSIS — N189 Chronic kidney disease, unspecified: Secondary | ICD-10-CM

## 2015-08-24 DIAGNOSIS — S8012XA Contusion of left lower leg, initial encounter: Secondary | ICD-10-CM | POA: Diagnosis not present

## 2015-08-24 DIAGNOSIS — D631 Anemia in chronic kidney disease: Secondary | ICD-10-CM

## 2015-08-24 LAB — POCT GLYCOSYLATED HEMOGLOBIN (HGB A1C): HEMOGLOBIN A1C: 5.6

## 2015-08-24 MED ORDER — MUPIROCIN 2 % EX OINT: 1.0000 "application " | TOPICAL_OINTMENT | Freq: Two times a day (BID) | CUTANEOUS | Status: DC

## 2015-08-24 NOTE — Progress Notes (Signed)
Subjective:    Patient ID: Ruben Conner, male    DOB: [birth year], 81 y.o.   MRN: [id number]  HPI Pt here for follow up and management of chronic medical problems which includes hypertension and diabetes. He is taking medications regularly. Today he also complains of a knot or sore on his left lower leg. The patient does not check his blood sugars at home. He comes to the visit today with his wife. He denies chest pain shortness of breath trouble swallowing and heartburn indigestion nausea vomiting diarrhea or blood in the stool. He reported the incident of nicking his left lower leg over the corner of a cabinet. He's had some edema since that time. He says the edema in his lower extremities always worse later in the day and better in the morning.       Patient Active Problem List   Diagnosis Date Noted  . Sinus bradycardia 08/31/2014  . First degree AV block 08/31/2014  . LBBB (left bundle branch block) 08/31/2014  . HTN (hypertension) 09/20/2013  . Diabetes ([place]) 05/02/2013  . Other and unspecified hyperlipidemia 05/02/2013   Outpatient Encounter Prescriptions as of 08/24/2015  Medication Sig  . amLODipine (NORVASC) 5 MG tablet TAKE (1) TABLET TWICE A DAY.  . benazepril (LOTENSIN) 20 MG tablet TAKE (1) TABLET TWICE A DAY.  . bimatoprost (LUMIGAN) 0.03 % ophthalmic solution 1 drop at bedtime.  Marland Kitchen glimepiride (AMARYL) 4 MG tablet TAKE 1 TABLET DAILY  . hydrochlorothiazide (HYDRODIURIL) 25 MG tablet Take 25 mg by mouth every morning.   . niacin (NIASPAN) 500 MG CR tablet 500 in AM and 1000 in evening  . pioglitazone (ACTOS) 30 MG tablet Take 1 tablet (30 mg total) by mouth daily.  . potassium chloride SA (K-DUR,KLOR-CON) 20 MEQ tablet TAKE 1 TABLET DAILY   No facility-administered encounter medications on file as of 08/24/2015.     Review of Systems  Constitutional: Negative.   HENT: Negative.   Eyes: Negative.   Respiratory: Negative.   Cardiovascular: Negative.     Gastrointestinal: Negative.   Endocrine: Negative.   Genitourinary: Negative.   Musculoskeletal: Negative.   Skin: Negative.        Sore/ knot - left lower leg  Allergic/Immunologic: Negative.   Neurological: Negative.   Hematological: Negative.   Psychiatric/Behavioral: Negative.        Objective:   Physical Exam  Constitutional: He is oriented to person, place, and time. He appears well-developed and well-nourished. No distress.  HENT:  Head: Normocephalic and atraumatic.  Right Ear: External ear normal.  Left Ear: External ear normal.  Nose: Nose normal.  Mouth/Throat: Oropharynx is clear and moist. No oropharyngeal exudate.  Eyes: Conjunctivae and EOM are normal. Pupils are equal, round, and reactive to light. Right eye exhibits no discharge. Left eye exhibits no discharge. No scleral icterus.  Neck: Normal range of motion. Neck supple. No thyromegaly present.  Cardiovascular: Normal rate, regular rhythm, normal heart sounds and intact distal pulses.  Exam reveals no gallop and no friction rub.   No murmur heard. At 60/m  Pulmonary/Chest: Effort normal and breath sounds normal. No respiratory distress. He has no wheezes. He has no rales. He exhibits no tenderness.  Clear anteriorly and posteriorly  Abdominal: Soft. Bowel sounds are normal. He exhibits no mass. There is no tenderness. There is no rebound and no guarding.  Mild obesity without organ enlargement masses or abdominal bruits  Genitourinary: Rectum normal and penis normal.  Patient has a very  large and somewhat firm prostate gland without lumps or nodules. There are no rectal masses. He has large bilateral hydroceles. The external genitalia are otherwise within normal limits and these hydroceles are not giving him any problem.  Musculoskeletal: Normal range of motion. He exhibits edema. He exhibits no tenderness.  There is some mild bilateral lower edema in both legs with the left being greater than right and some of  this secondary to the contusion and abrasion of his leg.  Lymphadenopathy:    He has no cervical adenopathy.  Neurological: He is alert and oriented to person, place, and time. He has normal reflexes. No cranial nerve deficit.  Skin: Skin is warm and dry. No rash noted. No erythema. No pallor.  The patient has a healing anterior lower left leg contusion with an eschar over it. There is minimal redness around the eschar and no drainage. It appears to be healing.  Psychiatric: He has a normal mood and affect. His behavior is normal. Judgment and thought content normal.  Nursing note and vitals reviewed.  BP 129/61 mmHg  Pulse 53  Temp(Src) 97.8 F (36.6 C) (Oral)  Ht 6' (1.829 m)  Wt 199 lb (90.266 kg)  BMI 26.98 kg/m2        Assessment & Plan:  1. Type 2 diabetes mellitus without complication, without long-term current use of insulin ([place]) -Patient should try to check his blood sugar a little bit more often if possible and continue to follow therapeutic lifestyle changes - POCT glycosylated hemoglobin (Hb A1C) - BMP8+EGFR - CBC with Differential/Platelet  2. Essential hypertension -The blood pressure is good today and he will continue with his current treatment - BMP8+EGFR - CBC with Differential/Platelet - Hepatic function panel - Lipid panel  3. Anemia in chronic kidney disease -He has no signs or symptoms of a low hemoglobin no weakness weak spells or blood in the stool. - CBC with Differential/Platelet  4. CKD (chronic kidney disease), unspecified stage -He will continue good blood pressure control and watching his sodium intake and we will monitor this as soon as the lab work is returned - BMP8+EGFR - CBC with Differential/Platelet  5. Health care maintenance -He will return to the clinic for his flu shot - Lipid panel - Vit D  25 hydroxy (rtn osteoporosis monitoring)  6. Multiple leg contusions, left, initial encounter -We'll use Bactroban ointment on the leg  and will call us if the redness gets worse or if he develops any drainage  Meds ordered this encounter  Medications  . mupirocin ointment (BACTROBAN) 2 %    Sig: Apply 1 application topically 2 (two) times daily.    Dispense:  22 g    Refill:  1   Patient Instructions                       Medicare Annual Wellness Visit  Ruben Conner and the medical providers at [facility] strive to bring you the best medical care.  In doing so we not only want to address your current medical conditions and concerns but also to detect new conditions early and prevent illness, disease and health-related problems.    Medicare offers a yearly Wellness Visit which allows our clinical staff to assess your need for preventative services including immunizations, lifestyle education, counseling to decrease risk of preventable diseases and screening for fall risk and other medical concerns.    This visit is provided free of charge (no copay) for  all Medicare recipients. The clinical pharmacists at [facility] have begun to conduct these Wellness Visits which will also include a thorough review of all your medications.    As you primary medical provider recommend that you make an appointment for your Annual Wellness Visit if you have not done so already this year.  You may set up this appointment before you leave today or you may call back ([phone]) and schedule an appointment.  Please make sure when you call that you mention that you are scheduling your Annual Wellness Visit with the clinical pharmacist so that the appointment may be made for the proper length of time.     Continue current medications. Continue good therapeutic lifestyle changes which include good diet and exercise. Fall precautions discussed with patient. If an FOBT was given today- please return it to our front desk. If you are over 68 years old - you may need Prevnar 65 or the adult Pneumonia  vaccine.  **Flu shots will be available soon--- please call and schedule a FLU-CLINIC appointment**  After your visit with [place] today you will receive a survey in the mail or online from [employer] regarding your care with [place]. Please take a moment to fill this out. Your feedback is very important to [place] as you can help [place] better understand your patient needs as well as improve your experience and satisfaction. WE CARE ABOUT YOU!!!   **Please join [place] SEPT.[email or web address], 2016 from 5:00 to 7:00pm for our OPEN HOUSE! Come out and meet our NEW providers**  The patient should try to check some blood sugars at home if possible He should call us back if he develops any worsening redness from the contusion he had to the left lower anterior leg He would be helpful if he would wear some good support hose and put these on in the morning upon first arising from the bed He should continue to watch his sodium intake He should return to the office and get his flu shot as planned He should plan to get his eye exam since it is due as soon as possible   Arrie Senate MD

## 2015-08-24 NOTE — Patient Instructions (Addendum)
Medicare Annual Wellness Visit  [facility] and the medical providers at [facility] strive to bring you the best medical care.  In doing so we not only want to address your current medical conditions and concerns but also to detect new conditions early and prevent illness, disease and health-related problems.    Medicare offers a yearly Wellness Visit which allows our clinical staff to assess your need for preventative services including immunizations, lifestyle education, counseling to decrease risk of preventable diseases and screening for fall risk and other medical concerns.    This visit is provided free of charge (no copay) for all Medicare recipients. The clinical pharmacists at [facility] have begun to conduct these Wellness Visits which will also include a thorough review of all your medications.    As you primary medical provider recommend that you make an appointment for your Annual Wellness Visit if you have not done so already this year.  You may set up this appointment before you leave today or you may call back ([phone]) and schedule an appointment.  Please make sure when you call that you mention that you are scheduling your Annual Wellness Visit with the clinical pharmacist so that the appointment may be made for the proper length of time.     Continue current medications. Continue good therapeutic lifestyle changes which include good diet and exercise. Fall precautions discussed with patient. If an FOBT was given today- please return it to our front desk. If you are over 45 years old - you may need Prevnar 78 or the adult Pneumonia vaccine.  **Flu shots will be available soon--- please call and schedule a FLU-CLINIC appointment**  After your visit with [place] today you will receive a survey in the mail or online from [employer] regarding your care with [place]. Please take a moment to fill this out. Your feedback is  very important to [place] as you can help [place] better understand your patient needs as well as improve your experience and satisfaction. WE CARE ABOUT YOU!!!   **Please join [place] SEPT.[email or web address], 2016 from 5:00 to 7:00pm for our OPEN HOUSE! Come out and meet our NEW providers**  The patient should try to check some blood sugars at home if possible He should call us back if he develops any worsening redness from the contusion he had to the left lower anterior leg He would be helpful if he would wear some good support hose and put these on in the morning upon first arising from the bed He should continue to watch his sodium intake He should return to the office and get his flu shot as planned He should plan to get his eye exam since it is due as soon as possible

## 2015-08-25 LAB — LIPID PANEL
CHOLESTEROL TOTAL: 142 mg/dL (ref 100–199)
Chol/HDL Ratio: 3.9 ratio units (ref 0.0–5.0)
HDL: 36 mg/dL — AB (ref >39)
LDL CALC: 86 mg/dL (ref 0–99)
Triglycerides: 101 mg/dL (ref 0–149)
VLDL CHOLESTEROL CAL: 20 mg/dL (ref 5–40)

## 2015-08-25 LAB — CBC WITH DIFFERENTIAL/PLATELET
BASOS ABS: 0 10*3/uL (ref 0.0–0.2)
Basos: 1 %
EOS (ABSOLUTE): 0.2 10*3/uL (ref 0.0–0.4)
Eos: 2 %
Hematocrit: 38.5 % (ref 37.5–51.0)
Hemoglobin: 12.9 g/dL (ref 12.6–17.7)
IMMATURE GRANS (ABS): 0 10*3/uL (ref 0.0–0.1)
IMMATURE GRANULOCYTES: 0 %
LYMPHS: 23 %
Lymphocytes Absolute: 1.9 10*3/uL (ref 0.7–3.1)
MCH: 29.9 pg (ref 26.6–33.0)
MCHC: 33.5 g/dL (ref 31.5–35.7)
MCV: 89 fL (ref 79–97)
Monocytes Absolute: 0.7 10*3/uL (ref 0.1–0.9)
Monocytes: 8 %
NEUTROS PCT: 66 %
Neutrophils Absolute: 5.6 10*3/uL (ref 1.4–7.0)
Platelets: 233 10*3/uL (ref 150–379)
RBC: 4.32 x10E6/uL (ref 4.14–5.80)
RDW: 14 % (ref 12.3–15.4)
WBC: 8.4 10*3/uL (ref 3.4–10.8)

## 2015-08-25 LAB — HEPATIC FUNCTION PANEL
ALT: 9 IU/L (ref 0–44)
AST: 12 IU/L (ref 0–40)
Albumin: 4.3 g/dL (ref 3.5–4.7)
Alkaline Phosphatase: 69 IU/L (ref 39–117)
Bilirubin Total: 0.4 mg/dL (ref 0.0–1.2)
Bilirubin, Direct: 0.11 mg/dL (ref 0.00–0.40)
TOTAL PROTEIN: 6.9 g/dL (ref 6.0–8.5)

## 2015-08-25 LAB — BMP8+EGFR
BUN/Creatinine Ratio: 14 (ref 10–22)
BUN: 26 mg/dL (ref 8–27)
CALCIUM: 8.9 mg/dL (ref 8.6–10.2)
CHLORIDE: 99 mmol/L (ref 97–108)
CO2: 21 mmol/L (ref 18–29)
Creatinine, Ser: 1.9 mg/dL — ABNORMAL HIGH (ref 0.76–1.27)
GFR calc Af Amer: 37 mL/min/{1.73_m2} — ABNORMAL LOW (ref >59)
GFR calc non Af Amer: 32 mL/min/{1.73_m2} — ABNORMAL LOW (ref >59)
GLUCOSE: 58 mg/dL — AB (ref 65–99)
POTASSIUM: 3.8 mmol/L (ref 3.5–5.2)
SODIUM: 139 mmol/L (ref 134–144)

## 2015-08-25 LAB — VITAMIN D 25 HYDROXY (VIT D DEFICIENCY, FRACTURES): Vit D, 25-Hydroxy: 9.9 ng/mL — ABNORMAL LOW (ref 30.0–100.0)

## 2015-10-23 ENCOUNTER — Other Ambulatory Visit: Payer: Self-pay | Admitting: Family Medicine

## 2015-12-22 ENCOUNTER — Other Ambulatory Visit: Payer: Self-pay | Admitting: Family Medicine

## 2016-01-10 ENCOUNTER — Ambulatory Visit (INDEPENDENT_AMBULATORY_CARE_PROVIDER_SITE_OTHER): Payer: PPO | Admitting: Family Medicine

## 2016-01-10 ENCOUNTER — Ambulatory Visit (INDEPENDENT_AMBULATORY_CARE_PROVIDER_SITE_OTHER): Payer: PPO

## 2016-01-10 ENCOUNTER — Encounter: Payer: Self-pay | Admitting: Family Medicine

## 2016-01-10 VITALS — BP 126/70 | HR 87 | Temp 98.4°F | Ht 72.0 in | Wt 206.0 lb

## 2016-01-10 DIAGNOSIS — E559 Vitamin D deficiency, unspecified: Secondary | ICD-10-CM | POA: Diagnosis not present

## 2016-01-10 DIAGNOSIS — E119 Type 2 diabetes mellitus without complications: Secondary | ICD-10-CM | POA: Diagnosis not present

## 2016-01-10 DIAGNOSIS — S99921A Unspecified injury of right foot, initial encounter: Secondary | ICD-10-CM

## 2016-01-10 DIAGNOSIS — I10 Essential (primary) hypertension: Secondary | ICD-10-CM | POA: Diagnosis not present

## 2016-01-10 DIAGNOSIS — N189 Chronic kidney disease, unspecified: Secondary | ICD-10-CM | POA: Diagnosis not present

## 2016-01-10 LAB — POCT GLYCOSYLATED HEMOGLOBIN (HGB A1C): Hemoglobin A1C: 5.6

## 2016-01-10 NOTE — Progress Notes (Signed)
Subjective:    Patient ID: Ruben Conner, male    DOB: 06/16/1930, 82 y.o.   MRN: [id number]  HPI Patient is here today for a 4 month recheck. His right foot is swollen. He states that when he stepped down he heard something pop. The foot has been swollen since that time but may be less so than initially. The patient denies any chest pain shortness of breath trouble swallowing heartburn indigestion nausea vomiting diarrhea or blood in the stool. He does not check his blood sugars regularly.    Review of Systems  Constitutional: Negative.   HENT: Negative.   Eyes: Negative.   Respiratory: Negative.   Cardiovascular: Negative.   Gastrointestinal: Negative.   Endocrine: Negative.   Genitourinary: Negative.   Musculoskeletal: Positive for joint swelling.       Right foot swelling.  Skin: Negative.   Allergic/Immunologic: Negative.   Neurological: Negative.   Hematological: Negative.   Psychiatric/Behavioral: Negative.          Patient Active Problem List   Diagnosis Date Noted  . Sinus bradycardia 08/31/2014  . First degree AV block 08/31/2014  . LBBB (left bundle branch block) 08/31/2014  . HTN (hypertension) 09/20/2013  . Diabetes ([place]) 05/02/2013  . Other and unspecified hyperlipidemia 05/02/2013   Outpatient Encounter Prescriptions as of 01/10/2016  Medication Sig  . amLODipine (NORVASC) 5 MG tablet TAKE (1) TABLET TWICE A DAY.  . benazepril (LOTENSIN) 20 MG tablet TAKE (1) TABLET TWICE A DAY.  . bimatoprost (LUMIGAN) 0.03 % ophthalmic solution 1 drop at bedtime.  Marland Kitchen glimepiride (AMARYL) 4 MG tablet TAKE 1 TABLET DAILY  . hydrochlorothiazide (HYDRODIURIL) 25 MG tablet Take 25 mg by mouth every morning.   . mupirocin ointment (BACTROBAN) 2 % Apply 1 application topically 2 (two) times daily.  . niacin (NIASPAN) 500 MG CR tablet 500 in AM and 1000 in evening  . pioglitazone (ACTOS) 30 MG tablet TAKE 1 TABLET DAILY  . potassium chloride SA (K-DUR,KLOR-CON) 20 MEQ  tablet TAKE 1 TABLET DAILY  . [DISCONTINUED] potassium chloride SA (K-DUR,KLOR-CON) 20 MEQ tablet TAKE 1 TABLET DAILY   No facility-administered encounter medications on file as of 01/10/2016.       Objective:   Physical Exam  Constitutional: He is oriented to person, place, and time. He appears well-developed and well-nourished. No distress.  HENT:  Head: Normocephalic and atraumatic.  Right Ear: External ear normal.  Left Ear: External ear normal.  Nose: Nose normal.  Mouth/Throat: Oropharynx is clear and moist. No oropharyngeal exudate.  Eyes: Conjunctivae and EOM are normal. Pupils are equal, round, and reactive to light. Right eye exhibits no discharge. Left eye exhibits no discharge. No scleral icterus.  Neck: Normal range of motion. Neck supple. No thyromegaly present.  No bruits or thyromegaly  Cardiovascular: Normal rate, regular rhythm, normal heart sounds and intact distal pulses.   No murmur heard. The heart is regular at 72/m  Pulmonary/Chest: Effort normal and breath sounds normal. No respiratory distress. He has no wheezes. He has no rales. He exhibits no tenderness.  Clear anteriorly and posteriorly  Abdominal: Soft. Bowel sounds are normal. He exhibits no mass. There is no tenderness. There is no rebound and no guarding.  No liver or spleen enlargement and no abdominal bruits  Genitourinary: Prostate normal.  Musculoskeletal: Normal range of motion. He exhibits edema. He exhibits no tenderness.  There is edema of the right foot and tenderness to palpation to the plantar surface especially extending the  toes upward. He continues to have good pulses.  Lymphadenopathy:    He has no cervical adenopathy.  Neurological: He is alert and oriented to person, place, and time.  Skin: Skin is warm and dry. No rash noted.  Psychiatric: He has a normal mood and affect. His behavior is normal. Judgment and thought content normal.  Nursing note and vitals reviewed.  BP 126/70 mmHg   Pulse 87  Temp(Src) 98.4 F (36.9 C) (Oral)  Ht 6' (1.829 m)  Wt 206 lb (93.441 kg)  BMI 27.93 kg/m2  [facility] reading (PRIMARY) by  Dr. Governor Rooks foot and chest x-ray--no obvious abnormalities were noted on films                                        Assessment & Plan:  1. Essential hypertension -The blood pressure is good today the patient will continue with his current treatment - DG Chest 2 View; Future - BMP8+EGFR - Hepatic function panel - Lipid panel - CBC with Differential/Platelet  2. Type 2 diabetes mellitus without complication, without long-term current use of insulin ([place]) -Continue with current treatment pending results of lab work - POCT glycosylated hemoglobin (Hb A1C) - Lipid panel  3. CKD (chronic kidney disease), unspecified stage -Continue to avoid all NSAIDs and watch sodium intake  4. Right foot injury, initial encounter -Elevation of the foot as much as possible and use support hose that are put on the first thing in the morning with getting out of the bed - DG Foot Complete Right; Future  5. Vitamin D deficiency -Continue current treatment pending results of lab work - VITAMIN D 25 Hydroxy (Vit-D Deficiency, Fractures)  Patient Instructions  Continue current medications. Continue good therapeutic lifestyle changes which include good diet and exercise. Fall precautions discussed with patient. If an FOBT was given today- please return it to our front desk. If you are over 55 years old - you may need Prevnar 69 or the adult Pneumonia vaccine.  Flu Shots will be available at our office starting mid- September. Please call and schedule a FLU CLINIC APPOINTMENT.                        Medicare Annual Wellness Visit  [place] [place] and the medical providers at [facility] strive to bring you the best medical care.  In doing so we not only want to address your current medical conditions and concerns but also to detect new  conditions early and prevent illness, disease and health-related problems.    Medicare offers a yearly Wellness Visit which allows our clinical staff to assess your need for preventative services including immunizations, lifestyle education, counseling to decrease risk of preventable diseases and screening for fall risk and other medical concerns.    This visit is provided free of charge (no copay) for all Medicare recipients. The clinical pharmacists at [facility] have begun to conduct these Wellness Visits which will also include a thorough review of all your medications.    As you primary medical provider recommend that you make an appointment for your Annual Wellness Visit if you have not done so already this year.  You may set up this appointment before you leave today or you may call back ([phone]) and schedule an appointment.  Please make sure when you call that you mention that you are  scheduling your Annual Wellness Visit with the clinical pharmacist so that the appointment may be made for the proper length of time.    Elevate foot as much as possible Put support hose on the first thing in the morning before getting out of the bed Continue to watch sodium intake    Arrie Senate MD

## 2016-01-10 NOTE — Patient Instructions (Addendum)
Continue current medications. Continue good therapeutic lifestyle changes which include good diet and exercise. Fall precautions discussed with patient. If an FOBT was given today- please return it to our front desk. If you are over 80 years old - you may need Prevnar 81 or the adult Pneumonia vaccine.  Flu Shots will be available at our office starting mid- September. Please call and schedule a FLU CLINIC APPOINTMENT.                        Medicare Annual Wellness Visit  [facility] and the medical providers at [facility] strive to bring you the best medical care.  In doing so we not only want to address your current medical conditions and concerns but also to detect new conditions early and prevent illness, disease and health-related problems.    Medicare offers a yearly Wellness Visit which allows our clinical staff to assess your need for preventative services including immunizations, lifestyle education, counseling to decrease risk of preventable diseases and screening for fall risk and other medical concerns.    This visit is provided free of charge (no copay) for all Medicare recipients. The clinical pharmacists at [facility] have begun to conduct these Wellness Visits which will also include a thorough review of all your medications.    As you primary medical provider recommend that you make an appointment for your Annual Wellness Visit if you have not done so already this year.  You may set up this appointment before you leave today or you may call back ([phone]) and schedule an appointment.  Please make sure when you call that you mention that you are scheduling your Annual Wellness Visit with the clinical pharmacist so that the appointment may be made for the proper length of time.    Elevate foot as much as possible Put support hose on the first thing in the morning before getting out of the bed Continue to watch sodium intake

## 2016-01-11 LAB — CBC WITH DIFFERENTIAL/PLATELET
BASOS: 1 %
Basophils Absolute: 0.1 10*3/uL (ref 0.0–0.2)
EOS (ABSOLUTE): 0.2 10*3/uL (ref 0.0–0.4)
Eos: 2 %
HEMATOCRIT: 41.9 % (ref 37.5–51.0)
Hemoglobin: 13.6 g/dL (ref 12.6–17.7)
IMMATURE GRANS (ABS): 0 10*3/uL (ref 0.0–0.1)
Immature Granulocytes: 0 %
LYMPHS: 21 %
Lymphocytes Absolute: 1.8 10*3/uL (ref 0.7–3.1)
MCH: 29.5 pg (ref 26.6–33.0)
MCHC: 32.5 g/dL (ref 31.5–35.7)
MCV: 91 fL (ref 79–97)
MONOS ABS: 0.7 10*3/uL (ref 0.1–0.9)
Monocytes: 8 %
NEUTROS ABS: 5.8 10*3/uL (ref 1.4–7.0)
Neutrophils: 68 %
PLATELETS: 284 10*3/uL (ref 150–379)
RBC: 4.61 x10E6/uL (ref 4.14–5.80)
RDW: 13.5 % (ref 12.3–15.4)
WBC: 8.5 10*3/uL (ref 3.4–10.8)

## 2016-01-11 LAB — BMP8+EGFR
BUN / CREAT RATIO: 12 (ref 10–22)
BUN: 23 mg/dL (ref 8–27)
CHLORIDE: 100 mmol/L (ref 96–106)
CO2: 20 mmol/L (ref 18–29)
CREATININE: 1.91 mg/dL — AB (ref 0.76–1.27)
Calcium: 8.9 mg/dL (ref 8.6–10.2)
GFR calc non Af Amer: 32 mL/min/{1.73_m2} — ABNORMAL LOW (ref >59)
GFR, EST AFRICAN AMERICAN: 37 mL/min/{1.73_m2} — AB (ref >59)
GLUCOSE: 75 mg/dL (ref 65–99)
Potassium: 4.1 mmol/L (ref 3.5–5.2)
SODIUM: 137 mmol/L (ref 134–144)

## 2016-01-11 LAB — HEPATIC FUNCTION PANEL
ALK PHOS: 83 IU/L (ref 39–117)
ALT: 9 IU/L (ref 0–44)
AST: 12 IU/L (ref 0–40)
Albumin: 4.1 g/dL (ref 3.5–4.7)
BILIRUBIN, DIRECT: 0.11 mg/dL (ref 0.00–0.40)
Bilirubin Total: 0.3 mg/dL (ref 0.0–1.2)
TOTAL PROTEIN: 6.7 g/dL (ref 6.0–8.5)

## 2016-01-11 LAB — LIPID PANEL
CHOL/HDL RATIO: 3.9 ratio (ref 0.0–5.0)
CHOLESTEROL TOTAL: 154 mg/dL (ref 100–199)
HDL: 39 mg/dL — ABNORMAL LOW (ref >39)
LDL CALC: 89 mg/dL (ref 0–99)
Triglycerides: 131 mg/dL (ref 0–149)
VLDL Cholesterol Cal: 26 mg/dL (ref 5–40)

## 2016-01-11 LAB — VITAMIN D 25 HYDROXY (VIT D DEFICIENCY, FRACTURES): Vit D, 25-Hydroxy: 6.9 ng/mL — ABNORMAL LOW (ref 30.0–100.0)

## 2016-01-16 ENCOUNTER — Other Ambulatory Visit: Payer: PPO

## 2016-01-16 DIAGNOSIS — N183 Chronic kidney disease, stage 3 unspecified: Secondary | ICD-10-CM

## 2016-01-16 DIAGNOSIS — Z1212 Encounter for screening for malignant neoplasm of rectum: Secondary | ICD-10-CM

## 2016-01-17 LAB — HEMOGLOBIN: HEMOGLOBIN: 13.3 g/dL (ref 12.6–17.7)

## 2016-01-17 LAB — RENAL FUNCTION PANEL
Albumin: 3.9 g/dL (ref 3.5–4.7)
BUN / CREAT RATIO: 13 (ref 10–22)
BUN: 24 mg/dL (ref 8–27)
CO2: 21 mmol/L (ref 18–29)
Calcium: 9.3 mg/dL (ref 8.6–10.2)
Chloride: 100 mmol/L (ref 96–106)
Creatinine, Ser: 1.86 mg/dL — ABNORMAL HIGH (ref 0.76–1.27)
GFR, EST AFRICAN AMERICAN: 38 mL/min/{1.73_m2} — AB (ref >59)
GFR, EST NON AFRICAN AMERICAN: 33 mL/min/{1.73_m2} — AB (ref >59)
GLUCOSE: 95 mg/dL (ref 65–99)
POTASSIUM: 4.1 mmol/L (ref 3.5–5.2)
Phosphorus: 3.5 mg/dL (ref 2.5–4.5)
SODIUM: 137 mmol/L (ref 134–144)

## 2016-01-17 LAB — PROTEIN / CREATININE RATIO, URINE
CREATININE, UR: 56.4 mg/dL
Protein, Ur: 274.4 mg/dL
Protein/Creat Ratio: 4865 mg/g creat — ABNORMAL HIGH (ref 0–200)

## 2016-01-17 LAB — PARATHYROID HORMONE, INTACT (NO CA): PTH: 87 pg/mL — ABNORMAL HIGH (ref 15–65)

## 2016-01-18 LAB — FECAL OCCULT BLOOD, IMMUNOCHEMICAL: FECAL OCCULT BLD: NEGATIVE

## 2016-01-21 DIAGNOSIS — R3129 Other microscopic hematuria: Secondary | ICD-10-CM | POA: Diagnosis not present

## 2016-01-21 DIAGNOSIS — R809 Proteinuria, unspecified: Secondary | ICD-10-CM | POA: Diagnosis not present

## 2016-01-21 DIAGNOSIS — I10 Essential (primary) hypertension: Secondary | ICD-10-CM | POA: Diagnosis not present

## 2016-01-21 DIAGNOSIS — N183 Chronic kidney disease, stage 3 (moderate): Secondary | ICD-10-CM | POA: Diagnosis not present

## 2016-02-18 ENCOUNTER — Other Ambulatory Visit: Payer: Self-pay | Admitting: Family Medicine

## 2016-02-21 ENCOUNTER — Encounter: Payer: Self-pay | Admitting: Cardiovascular Disease

## 2016-02-21 ENCOUNTER — Ambulatory Visit (INDEPENDENT_AMBULATORY_CARE_PROVIDER_SITE_OTHER): Payer: PPO | Admitting: Cardiovascular Disease

## 2016-02-21 VITALS — BP 136/76 | HR 56 | Ht 72.0 in | Wt 206.0 lb

## 2016-02-21 DIAGNOSIS — I447 Left bundle-branch block, unspecified: Secondary | ICD-10-CM

## 2016-02-21 DIAGNOSIS — N2889 Other specified disorders of kidney and ureter: Secondary | ICD-10-CM

## 2016-02-21 DIAGNOSIS — R001 Bradycardia, unspecified: Secondary | ICD-10-CM

## 2016-02-21 DIAGNOSIS — I151 Hypertension secondary to other renal disorders: Secondary | ICD-10-CM

## 2016-02-21 DIAGNOSIS — I440 Atrioventricular block, first degree: Secondary | ICD-10-CM

## 2016-02-21 DIAGNOSIS — E1122 Type 2 diabetes mellitus with diabetic chronic kidney disease: Secondary | ICD-10-CM

## 2016-02-21 DIAGNOSIS — N183 Chronic kidney disease, stage 3 unspecified: Secondary | ICD-10-CM

## 2016-02-21 NOTE — Patient Instructions (Signed)
Your physician recommends that you continue on your current medications as directed. Please refer to the Current Medication list given to you today.  Dr Croitoru recommends that you schedule a follow-up appointment in 1 year. You will receive a reminder letter in the mail two months in advance. If you don't receive a letter, please call our office to schedule the follow-up appointment.  If you need a refill on your cardiac medications before your next appointment, please call your pharmacy. 

## 2016-02-22 NOTE — Progress Notes (Signed)
Patient ID: Ruben Conner, male   DOB: Jun 21, 1930, 82 y.o.   MRN: [id number]    Cardiology Office Note    Date:  02/22/2016   ID:  Ruben Conner, DOB 05-03-1930, MRN [id number]  PCP:  Redge Gainer, MD  Cardiologist:   Sanda Klein, MD   Chief Complaint  Patient presents with  . Annual Exam    no chest pain, has shortness of breath, has edema in Right foot, has pain in knee and foot, no lightheaded or dizziness    History of Present Illness:  Ruben Conner is a 82 y.o. male with asymptomatic bradycardia, left bundle branch block and prolonged AV conduction time who returns for routine follow-up. He has systemic hypertension and type 2 diabetes mellitus on oral anti-diabetics. He does not have any known structural heart disease.  Since his last appointment she denies any issues with syncope or near syncope, dizziness, lightheadedness or fatigue. He has mild swelling in his feet, especially in the right foot where he has a fifth metatarsal fracture. He keeps his legs elevated the edema resolves quickly. He denies angina, dyspnea, claudication or focal neurological complaints. He is not aware of any palpitations.  He is most concerned by the progressive dementia of his wife Ruben Conner who is also my patient. 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    Past Medical History  Diagnosis Date  . Hypertension   . Hyperlipidemia   . Diabetes mellitus without complication ([place])   . Irregular heart rate     Past Surgical History  Procedure Laterality Date  . Rotator cuff repair    . 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 Eye surgery    . Mouth surgery      Current Medications: Outpatient Prescriptions Prior to Visit  Medication Sig Dispense Refill  . amLODipine (NORVASC) 5 MG tablet TAKE (1) TABLET TWICE A DAY. 60 tablet 4  . benazepril (LOTENSIN) 20 MG tablet TAKE (1) TABLET TWICE A DAY. 60 tablet 4  . bimatoprost (LUMIGAN) 0.03 % ophthalmic solution 1 drop at  bedtime.    Marland Kitchen glimepiride (AMARYL) 4 MG tablet TAKE 1 TABLET DAILY 30 tablet 4  . hydrochlorothiazide (HYDRODIURIL) 25 MG tablet Take 25 mg by mouth every morning.     . mupirocin ointment (BACTROBAN) 2 % Apply 1 application topically 2 (two) times daily. 22 g 1  . niacin (NIASPAN) 500 MG CR tablet 500 in AM and 1000 in evening    . pioglitazone (ACTOS) 30 MG tablet TAKE 1 TABLET DAILY 30 tablet 4  . potassium chloride SA (K-DUR,KLOR-CON) 20 MEQ tablet TAKE 1 TABLET DAILY 30 tablet 4   No facility-administered medications prior to visit.     Allergies:   Review of patient's allergies indicates no known allergies.   Social History   Social History  . Marital Status: Married    Spouse Name: N/A  . Number of Children: 1  . Years of Education: N/A   Occupational History  . Retired Health and safety inspector    Social History Main Topics  . Smoking status: Current Some Day Smoker -- 0.10 packs/day    Types: Cigarettes  . Smokeless tobacco: None  . Alcohol Use: No  . Drug Use: No  . Sexual Activity: Not Asked   Other Topics Concern  . None   Social History Narrative     Family History:  The patient's family history includes Heart disease in his father; Kidney disease in  his mother.   ROS:   Please see the history of present illness.    ROS All other systems reviewed and are negative.   PHYSICAL EXAM:   VS:  BP 136/76 mmHg  Pulse 56  Ht 6' (1.829 m)  Wt 93.441 kg (206 lb)  BMI 27.93 kg/m2   GEN: Well nourished, well developed, in no acute distress HEENT: normal Neck: no JVD, carotid bruits, or masses Cardiac: Paradoxically split second heart sound RRR; no murmurs, rubs, or gallops, 2+ right pedal edema , 1+ left pedal edema Respiratory:  clear to auscultation bilaterally, normal work of breathing GI: soft, nontender, nondistended, + BS MS: no deformity or atrophy Skin: warm and dry, no rash Neuro:  Alert and Oriented x 3, Strength and sensation are intact Psych: euthymic mood, full  affect  Wt Readings from Last 3 Encounters:  02/21/16 93.441 kg (206 lb)  01/10/16 93.441 kg (206 lb)  08/24/15 90.266 kg (199 lb)      Studies/Labs Reviewed:   EKG:  EKG is ordered today.  The ekg ordered today demonstrates Sinus bradycardia, first-degree AV block, left bundle branch block (PR interval 312 ms)  Recent Labs: 04/24/2015: Hemoglobin 14.0* 01/10/2016: ALT 9; Platelets 284 01/16/2016: BUN 24; Creatinine, Ser 1.86*; Potassium 4.1; Sodium 137   Lipid Panel    Component Value Date/Time   CHOL 154 01/10/2016 1608   CHOL 140 07/06/2014 0936   TRIG 131 01/10/2016 1608   TRIG 108 07/06/2014 0936   HDL 39* 01/10/2016 1608   HDL 35* 07/06/2014 0936   CHOLHDL 3.9 01/10/2016 1608   LDLCALC 89 01/10/2016 1608   LDLCALC 83 07/06/2014 0936     ASSESSMENT:    1. Sinus bradycardia   2. First degree AV block   3. LBBB (left bundle branch block)   4. Hypertension secondary to other renal disorders   5. Type 2 diabetes mellitus with stage 3 chronic kidney disease, without long-term current use of insulin (HCC)     PLAN:  In order of problems listed above:  1. His bradycardia is less prominent than it was a year ago. It is important to avoid any medications that could have a negative chronotropic effect. As long as he remains asymptomatic pacemaker therapy is not necessary. We discussed the fact that his symptoms may become apparent in one of 2 ways. If he has worsening sinus bradycardia, he will likely develop fatigue and dyspnea. However he also has evidence of substantial AV conduction abnormalities and may present with high degree AV block and syncope. Either way he should promptly requests medical attention. His blood pressure is well controlled on the current regimen. Amlodipine may be contributing to his edema, so is the Actos.    Medication Adjustments/Labs and Tests Ordered: Current medicines are reviewed at length with the patient today.  Concerns regarding medicines  are outlined above.  Medication changes, Labs and Tests ordered today are listed in the Patient Instructions below. Patient Instructions  Your physician recommends that you continue on your current medications as directed. Please refer to the Current Medication list given to you today.  Dr Sallyanne Kuster recommends that you schedule a follow-up appointment in 1 year. You will receive a reminder letter in the mail two months in advance. If you don't receive a letter, please call our office to schedule the follow-up appointment.  If you need a refill on your cardiac medications before your next appointment, please call your pharmacy.    Mikael Spray, MD  02/22/2016  12:38 PM    [facility] [address], [place], [place]  [address] Phone: [phone]; Fax: [phone]

## 2016-03-17 ENCOUNTER — Other Ambulatory Visit: Payer: Self-pay | Admitting: Family Medicine

## 2016-03-18 ENCOUNTER — Encounter: Payer: Self-pay | Admitting: *Deleted

## 2016-04-28 ENCOUNTER — Other Ambulatory Visit: Payer: PPO

## 2016-04-28 DIAGNOSIS — R809 Proteinuria, unspecified: Secondary | ICD-10-CM | POA: Diagnosis not present

## 2016-05-14 ENCOUNTER — Ambulatory Visit: Payer: PPO | Admitting: Family Medicine

## 2016-05-15 ENCOUNTER — Ambulatory Visit (INDEPENDENT_AMBULATORY_CARE_PROVIDER_SITE_OTHER): Payer: PPO | Admitting: Family Medicine

## 2016-05-15 ENCOUNTER — Encounter: Payer: Self-pay | Admitting: Family Medicine

## 2016-05-15 VITALS — BP 139/70 | HR 56 | Temp 97.5°F | Ht 72.0 in | Wt 204.0 lb

## 2016-05-15 DIAGNOSIS — I10 Essential (primary) hypertension: Secondary | ICD-10-CM | POA: Diagnosis not present

## 2016-05-15 DIAGNOSIS — M25562 Pain in left knee: Secondary | ICD-10-CM

## 2016-05-15 DIAGNOSIS — N189 Chronic kidney disease, unspecified: Secondary | ICD-10-CM | POA: Diagnosis not present

## 2016-05-15 DIAGNOSIS — R609 Edema, unspecified: Secondary | ICD-10-CM | POA: Diagnosis not present

## 2016-05-15 DIAGNOSIS — E119 Type 2 diabetes mellitus without complications: Secondary | ICD-10-CM | POA: Diagnosis not present

## 2016-05-15 DIAGNOSIS — E559 Vitamin D deficiency, unspecified: Secondary | ICD-10-CM | POA: Diagnosis not present

## 2016-05-15 DIAGNOSIS — R600 Localized edema: Secondary | ICD-10-CM

## 2016-05-15 DIAGNOSIS — N184 Chronic kidney disease, stage 4 (severe): Secondary | ICD-10-CM

## 2016-05-15 DIAGNOSIS — R001 Bradycardia, unspecified: Secondary | ICD-10-CM | POA: Diagnosis not present

## 2016-05-15 LAB — BAYER DCA HB A1C WAIVED: HB A1C (BAYER DCA - WAIVED): 5.9 % (ref <7.0)

## 2016-05-15 NOTE — Progress Notes (Signed)
Subjective:    Patient ID: Ruben Conner, male    DOB: 1929/12/09, 82 y.o.   MRN: [id number]  HPI Pt here for follow up and management of chronic medical problems which includes diabetes and hyperlipidemia. He is taking medications regularly.     Patient Active Problem List   Diagnosis Date Noted  . Sinus bradycardia 08/31/2014  . First degree AV block 08/31/2014  . LBBB (left bundle branch block) 08/31/2014  . HTN (hypertension) 09/20/2013  . Diabetes ([place]) 05/02/2013  . Other and unspecified hyperlipidemia 05/02/2013   Outpatient Encounter Prescriptions as of 05/15/2016  Medication Sig  . amLODipine (NORVASC) 5 MG tablet TAKE (1) TABLET TWICE A DAY.  . benazepril (LOTENSIN) 20 MG tablet TAKE (1) TABLET TWICE A DAY.  . bimatoprost (LUMIGAN) 0.03 % ophthalmic solution 1 drop at bedtime.  . furosemide (LASIX) 40 MG tablet Take 1 tablet by mouth 2 (two) times daily as needed.  Marland Kitchen glimepiride (AMARYL) 4 MG tablet TAKE 1 TABLET DAILY  . hydrochlorothiazide (HYDRODIURIL) 25 MG tablet Take 25 mg by mouth every morning.   . mupirocin ointment (BACTROBAN) 2 % Apply 1 application topically 2 (two) times daily.  . niacin (NIASPAN) 500 MG CR tablet 500 in AM and 1000 in evening  . pioglitazone (ACTOS) 30 MG tablet TAKE 1 TABLET DAILY  . potassium chloride SA (K-DUR,KLOR-CON) 20 MEQ tablet TAKE 1 TABLET DAILY   No facility-administered encounter medications on file as of 05/15/2016.     Review of Systems  Constitutional: Negative.   HENT: Negative.   Eyes: Negative.   Respiratory: Negative.   Cardiovascular: Negative.   Gastrointestinal: Negative.   Endocrine: Negative.   Genitourinary: Negative.   Musculoskeletal: Negative.   Skin: Negative.   Allergic/Immunologic: Negative.   Neurological: Negative.   Hematological: Negative.   Psychiatric/Behavioral: Negative.        Objective:   Physical Exam  Constitutional: He is oriented to person, place, and time. He appears  well-developed and well-nourished. No distress.  HENT:  Head: Normocephalic and atraumatic.  Right Ear: External ear normal.  Left Ear: External ear normal.  Nose: Nose normal.  Mouth/Throat: Oropharynx is clear and moist. No oropharyngeal exudate.  Eyes: Conjunctivae and EOM are normal. Pupils are equal, round, and reactive to light. Right eye exhibits no discharge. Left eye exhibits no discharge. No scleral icterus.  The patient plans a visit with the ophthalmologist soon  Neck: Normal range of motion. Neck supple. No thyromegaly present.  No bruits or thyromegaly  Cardiovascular: Normal rate, regular rhythm, normal heart sounds and intact distal pulses.   No murmur heard. Heart had a regular rate and rhythm today at 60/m  Pulmonary/Chest: Effort normal and breath sounds normal. No respiratory distress. He has no wheezes. He has no rales. He exhibits no tenderness.  Clear anteriorly and posteriorly  Abdominal: Soft. Bowel sounds are normal. He exhibits no mass. There is no tenderness. There is no rebound and no guarding.  No bruits liver or spleen enlargement or masses and no inguinal nodes  Musculoskeletal: Normal range of motion. He exhibits edema and tenderness.  Slight swelling in the right ankle and foot secondary to remote metatarsal fracture. No pain elicited in joint lines of left knee but slight tenderness in the popliteal area  Lymphadenopathy:    He has no cervical adenopathy.  Neurological: He is alert and oriented to person, place, and time.  Skin: Skin is warm and dry. No rash noted.  Psychiatric: He has  a normal mood and affect. His behavior is normal. Judgment and thought content normal.  Nursing note and vitals reviewed.   BP 139/70 mmHg  Pulse 56  Temp(Src) 97.5 F (36.4 C) (Oral)  Ht 6' (1.829 m)  Wt 204 lb (92.534 kg)  BMI 27.66 kg/m2       Assessment & Plan:  1. Essential hypertension -The blood pressure is good today and the patient will continue with  current treatment - BMP8+EGFR - CBC with Differential/Platelet - Hepatic function panel - Lipid panel  2. Type 2 diabetes mellitus without complication, without long-term current use of insulin (HCC) -The patient does not check blood sugars at home on a routine basis. He will continue with current treatment pending results of lab work. - BMP8+EGFR - CBC with Differential/Platelet - Lipid panel - Bayer DCA Hb A1c Waived  3. CKD (chronic kidney disease), stage IV -Continue to avoid all NSAIDs. The last creatinine was 1.86 with a GFR of 33. - BMP8+EGFR - CBC with Differential/Platelet  4. Vitamin D deficiency -Any current treatment pending results of lab work - CBC with Differential/Platelet - VITAMIN D 25 Hydroxy (Vit-D Deficiency, Fractures)  5. Left knee pain -If needed pain continues patient will call and make an appointment with orthopedic surgeon that comes to this office for further evaluation and possible injection  6. Edema of right foot -Wear support hose and the should be put on the first thing with arising in the morning  7. Sinus bradycardia -The patient had a normal sinus rhythm at 60 today. He will continue to follow-up with cardiology.  Patient Instructions                       Medicare Annual Wellness Visit  [facility] and the medical providers at [facility] strive to bring you the best medical care.  In doing so we not only want to address your current medical conditions and concerns but also to detect new conditions early and prevent illness, disease and health-related problems.    Medicare offers a yearly Wellness Visit which allows our clinical staff to assess your need for preventative services including immunizations, lifestyle education, counseling to decrease risk of preventable diseases and screening for fall risk and other medical concerns.    This visit is provided free of charge (no copay) for all Medicare recipients. The  clinical pharmacists at [facility] have begun to conduct these Wellness Visits which will also include a thorough review of all your medications.    As you primary medical provider recommend that you make an appointment for your Annual Wellness Visit if you have not done so already this year.  You may set up this appointment before you leave today or you may call back ([phone]) and schedule an appointment.  Please make sure when you call that you mention that you are scheduling your Annual Wellness Visit with the clinical pharmacist so that the appointment may be made for the proper length of time.     Continue current medications. Continue good therapeutic lifestyle changes which include good diet and exercise. Fall precautions discussed with patient. If an FOBT was given today- please return it to our front desk. If you are over 64 years old - you may need Prevnar 6 or the adult Pneumonia vaccine.  **Flu shots are available--- please call and schedule a FLU-CLINIC appointment**  After your visit with [place] today you will receive a survey in the  mail or online from [employer] regarding your care with [place]. Please take a moment to fill this out. Your feedback is very important to [place] as you can help [place] better understand your patient needs as well as improve your experience and satisfaction. WE CARE ABOUT YOU!!!   If the left knee pain continues, be sure and call back to make an appointment with orthopedist that comes in from [facility]. Try to check a few blood sugars at home prior to the next visit Continue to follow diet closely and drink plenty of fluids and stay well hydrated the summer.     Arrie Senate MD

## 2016-05-15 NOTE — Patient Instructions (Addendum)
Medicare Annual Wellness Visit  [facility] and the medical providers at [facility] strive to bring you the best medical care.  In doing so we not only want to address your current medical conditions and concerns but also to detect new conditions early and prevent illness, disease and health-related problems.    Medicare offers a yearly Wellness Visit which allows our clinical staff to assess your need for preventative services including immunizations, lifestyle education, counseling to decrease risk of preventable diseases and screening for fall risk and other medical concerns.    This visit is provided free of charge (no copay) for all Medicare recipients. The clinical pharmacists at [facility] have begun to conduct these Wellness Visits which will also include a thorough review of all your medications.    As you primary medical provider recommend that you make an appointment for your Annual Wellness Visit if you have not done so already this year.  You may set up this appointment before you leave today or you may call back ([phone]) and schedule an appointment.  Please make sure when you call that you mention that you are scheduling your Annual Wellness Visit with the clinical pharmacist so that the appointment may be made for the proper length of time.     Continue current medications. Continue good therapeutic lifestyle changes which include good diet and exercise. Fall precautions discussed with patient. If an FOBT was given today- please return it to our front desk. If you are over 69 years old - you may need Prevnar 29 or the adult Pneumonia vaccine.  **Flu shots are available--- please call and schedule a FLU-CLINIC appointment**  After your visit with [place] today you will receive a survey in the mail or online from [employer] regarding your care with [place]. Please take a moment to fill this out. Your feedback is very  important to [place] as you can help [place] better understand your patient needs as well as improve your experience and satisfaction. WE CARE ABOUT YOU!!!   If the left knee pain continues, be sure and call back to make an appointment with orthopedist that comes in from [place]. Try to check a few blood sugars at home prior to the next visit Continue to follow diet closely and drink plenty of fluids and stay well hydrated the summer.

## 2016-05-16 LAB — BMP8+EGFR
BUN / CREAT RATIO: 17 (ref 10–24)
BUN: 37 mg/dL — AB (ref 8–27)
CHLORIDE: 104 mmol/L (ref 96–106)
CO2: 21 mmol/L (ref 18–29)
CREATININE: 2.2 mg/dL — AB (ref 0.76–1.27)
Calcium: 8.9 mg/dL (ref 8.6–10.2)
GFR, EST AFRICAN AMERICAN: 31 mL/min/{1.73_m2} — AB (ref >59)
GFR, EST NON AFRICAN AMERICAN: 27 mL/min/{1.73_m2} — AB (ref >59)
Glucose: 96 mg/dL (ref 65–99)
Potassium: 3.8 mmol/L (ref 3.5–5.2)
Sodium: 142 mmol/L (ref 134–144)

## 2016-05-16 LAB — CBC WITH DIFFERENTIAL/PLATELET
BASOS: 1 %
Basophils Absolute: 0.1 10*3/uL (ref 0.0–0.2)
EOS (ABSOLUTE): 0.2 10*3/uL (ref 0.0–0.4)
EOS: 2 %
HEMATOCRIT: 37.1 % — AB (ref 37.5–51.0)
HEMOGLOBIN: 12.5 g/dL — AB (ref 12.6–17.7)
IMMATURE GRANS (ABS): 0 10*3/uL (ref 0.0–0.1)
Immature Granulocytes: 0 %
LYMPHS ABS: 1.8 10*3/uL (ref 0.7–3.1)
Lymphs: 21 %
MCH: 29.6 pg (ref 26.6–33.0)
MCHC: 33.7 g/dL (ref 31.5–35.7)
MCV: 88 fL (ref 79–97)
MONOCYTES: 6 %
Monocytes Absolute: 0.5 10*3/uL (ref 0.1–0.9)
NEUTROS ABS: 6.1 10*3/uL (ref 1.4–7.0)
Neutrophils: 70 %
Platelets: 254 10*3/uL (ref 150–379)
RBC: 4.22 x10E6/uL (ref 4.14–5.80)
RDW: 13.6 % (ref 12.3–15.4)
WBC: 8.7 10*3/uL (ref 3.4–10.8)

## 2016-05-16 LAB — HEPATIC FUNCTION PANEL
ALBUMIN: 4.2 g/dL (ref 3.5–4.7)
ALK PHOS: 84 IU/L (ref 39–117)
ALT: 9 IU/L (ref 0–44)
AST: 11 IU/L (ref 0–40)
BILIRUBIN, DIRECT: 0.09 mg/dL (ref 0.00–0.40)
Bilirubin Total: 0.3 mg/dL (ref 0.0–1.2)
TOTAL PROTEIN: 6.9 g/dL (ref 6.0–8.5)

## 2016-05-16 LAB — LIPID PANEL
Chol/HDL Ratio: 4.3 ratio units (ref 0.0–5.0)
Cholesterol, Total: 145 mg/dL (ref 100–199)
HDL: 34 mg/dL — ABNORMAL LOW (ref >39)
LDL CALC: 85 mg/dL (ref 0–99)
Triglycerides: 130 mg/dL (ref 0–149)
VLDL CHOLESTEROL CAL: 26 mg/dL (ref 5–40)

## 2016-05-16 LAB — VITAMIN D 25 HYDROXY (VIT D DEFICIENCY, FRACTURES): Vit D, 25-Hydroxy: 6.9 ng/mL — ABNORMAL LOW (ref 30.0–100.0)

## 2016-06-24 DIAGNOSIS — H402231 Chronic angle-closure glaucoma, bilateral, mild stage: Secondary | ICD-10-CM | POA: Diagnosis not present

## 2016-06-24 DIAGNOSIS — Z961 Presence of intraocular lens: Secondary | ICD-10-CM | POA: Diagnosis not present

## 2016-07-07 DIAGNOSIS — H402231 Chronic angle-closure glaucoma, bilateral, mild stage: Secondary | ICD-10-CM | POA: Diagnosis not present

## 2016-07-11 ENCOUNTER — Other Ambulatory Visit: Payer: Self-pay | Admitting: Family Medicine

## 2016-07-14 DIAGNOSIS — H402231 Chronic angle-closure glaucoma, bilateral, mild stage: Secondary | ICD-10-CM | POA: Diagnosis not present

## 2016-08-04 DIAGNOSIS — I10 Essential (primary) hypertension: Secondary | ICD-10-CM | POA: Diagnosis not present

## 2016-08-04 DIAGNOSIS — R809 Proteinuria, unspecified: Secondary | ICD-10-CM | POA: Diagnosis not present

## 2016-08-04 DIAGNOSIS — N183 Chronic kidney disease, stage 3 (moderate): Secondary | ICD-10-CM | POA: Diagnosis not present

## 2016-08-07 ENCOUNTER — Other Ambulatory Visit: Payer: PPO

## 2016-08-07 DIAGNOSIS — R809 Proteinuria, unspecified: Secondary | ICD-10-CM | POA: Diagnosis not present

## 2016-08-07 DIAGNOSIS — N183 Chronic kidney disease, stage 3 (moderate): Secondary | ICD-10-CM | POA: Diagnosis not present

## 2016-08-08 ENCOUNTER — Other Ambulatory Visit: Payer: Self-pay | Admitting: Family Medicine

## 2016-09-08 ENCOUNTER — Other Ambulatory Visit: Payer: Self-pay | Admitting: Family Medicine

## 2016-09-18 ENCOUNTER — Encounter: Payer: Self-pay | Admitting: Family Medicine

## 2016-09-18 ENCOUNTER — Ambulatory Visit (INDEPENDENT_AMBULATORY_CARE_PROVIDER_SITE_OTHER): Payer: PPO | Admitting: Family Medicine

## 2016-09-18 VITALS — BP 125/72 | HR 70 | Temp 98.0°F | Ht 72.0 in | Wt 201.0 lb

## 2016-09-18 DIAGNOSIS — N400 Enlarged prostate without lower urinary tract symptoms: Secondary | ICD-10-CM

## 2016-09-18 DIAGNOSIS — Z23 Encounter for immunization: Secondary | ICD-10-CM | POA: Diagnosis not present

## 2016-09-18 DIAGNOSIS — N184 Chronic kidney disease, stage 4 (severe): Secondary | ICD-10-CM | POA: Diagnosis not present

## 2016-09-18 DIAGNOSIS — Z0000 Encounter for general adult medical examination without abnormal findings: Secondary | ICD-10-CM | POA: Diagnosis not present

## 2016-09-18 DIAGNOSIS — E119 Type 2 diabetes mellitus without complications: Secondary | ICD-10-CM

## 2016-09-18 DIAGNOSIS — I10 Essential (primary) hypertension: Secondary | ICD-10-CM | POA: Diagnosis not present

## 2016-09-18 DIAGNOSIS — K4021 Bilateral inguinal hernia, without obstruction or gangrene, recurrent: Secondary | ICD-10-CM

## 2016-09-18 DIAGNOSIS — D631 Anemia in chronic kidney disease: Secondary | ICD-10-CM | POA: Diagnosis not present

## 2016-09-18 DIAGNOSIS — E559 Vitamin D deficiency, unspecified: Secondary | ICD-10-CM | POA: Diagnosis not present

## 2016-09-18 LAB — MICROSCOPIC EXAMINATION
Bacteria, UA: NONE SEEN
RBC MICROSCOPIC, UA: NONE SEEN /HPF (ref 0–?)

## 2016-09-18 LAB — URINALYSIS, COMPLETE
BILIRUBIN UA: NEGATIVE
Glucose, UA: NEGATIVE
KETONES UA: NEGATIVE
Leukocytes, UA: NEGATIVE
NITRITE UA: NEGATIVE
SPEC GRAV UA: 1.02 (ref 1.005–1.030)
UUROB: 0.2 mg/dL (ref 0.2–1.0)
pH, UA: 5.5 (ref 5.0–7.5)

## 2016-09-18 LAB — BAYER DCA HB A1C WAIVED: HB A1C: 5.8 % (ref <7.0)

## 2016-09-18 NOTE — Progress Notes (Signed)
Subjective:    Patient ID: Ruben Conner, male    DOB: 1930-10-15, 82 y.o.   MRN: [id number]  HPI Pt here for follow up and management of chronic medical problems which includes diabetes and hypertension. He is taking medications regularly.Patient is doing well overall. He has a history of sinus bradycardia that he is asymptomatic with your he also has a history of left bundle branch block and first-degree AV block. He sees Dr. Loletha Grayer regularly and last saw him in the spring and the cardiologist thought at that time that everything was stable. He did have some edema at the time but this is pretty routine for this patient. He has no complaints. He will see the cardiologist again in the spring of 2018. The patient denies any chest pain shortness of breath anymore than usual trouble swallowing heartburn indigestion nausea vomiting diarrhea or blood in the stool. He does not check his blood sugars regularly because his A1c's are always good. He does see the nephrologist every 6 months and the cardiologist about once yearly.    Patient Active Problem List   Diagnosis Date Noted  . Sinus bradycardia 08/31/2014  . First degree AV block 08/31/2014  . LBBB (left bundle branch block) 08/31/2014  . HTN (hypertension) 09/20/2013  . Diabetes ([place]) 05/02/2013  . Other and unspecified hyperlipidemia 05/02/2013   Outpatient Encounter Prescriptions as of 09/18/2016  Medication Sig  . amLODipine (NORVASC) 5 MG tablet TAKE (1) TABLET TWICE A DAY.  . benazepril (LOTENSIN) 20 MG tablet TAKE (1) TABLET TWICE A DAY.  . bimatoprost (LUMIGAN) 0.03 % ophthalmic solution 1 drop at bedtime.  . furosemide (LASIX) 40 MG tablet Take 1 tablet by mouth 2 (two) times daily as needed.  Marland Kitchen glimepiride (AMARYL) 4 MG tablet TAKE 1 TABLET DAILY  . hydrochlorothiazide (HYDRODIURIL) 25 MG tablet Take 25 mg by mouth every morning.   . mupirocin ointment (BACTROBAN) 2 % Apply 1 application topically 2 (two) times daily.  . niacin  (NIASPAN) 500 MG CR tablet 500 in AM and 1000 in evening  . pioglitazone (ACTOS) 30 MG tablet TAKE 1 TABLET DAILY  . potassium chloride SA (K-DUR,KLOR-CON) 20 MEQ tablet TAKE 1 TABLET DAILY   No facility-administered encounter medications on file as of 09/18/2016.       Review of Systems  Constitutional: Negative.   HENT: Negative.   Eyes: Negative.   Respiratory: Negative.   Cardiovascular: Negative.   Gastrointestinal: Negative.   Endocrine: Negative.   Genitourinary: Negative.   Musculoskeletal: Negative.   Skin: Negative.   Allergic/Immunologic: Negative.   Neurological: Negative.   Hematological: Negative.   Psychiatric/Behavioral: Negative.        Objective:   Physical Exam  Constitutional: He is oriented to person, place, and time. He appears well-developed and well-nourished. No distress.  Pleasant and alert  HENT:  Head: Normocephalic and atraumatic.  Left Ear: External ear normal.  Nose: Nose normal.  Mouth/Throat: Oropharynx is clear and moist. No oropharyngeal exudate.  Cerumen right ear canal  Eyes: Conjunctivae and EOM are normal. Pupils are equal, round, and reactive to light. Right eye exhibits no discharge. Left eye exhibits no discharge. No scleral icterus.  Neck: Normal range of motion. Neck supple. No thyromegaly present.  No bruits or thyromegaly or adenopathy  Cardiovascular: Normal rate, regular rhythm, normal heart sounds and intact distal pulses.   No murmur heard. Heart had a regular rate and rhythm at 72/m  Pulmonary/Chest: Effort normal and breath sounds normal.  No respiratory distress. He has no wheezes. He has no rales. He exhibits no tenderness.  Clear anteriorly and posteriorly and no axillary adenopathy  Abdominal: Soft. Bowel sounds are normal. He exhibits no mass. There is no tenderness. There is no rebound and no guarding.  No abdominal tenderness masses or organ enlargement  Genitourinary: Rectum normal and penis normal.    Genitourinary Comments: The patient has bilateral inguinal hernias. The patient has a very very large prostate which is firm in nature. He says once again he is having no symptoms with passing his water. There were no rectal masses.  Musculoskeletal: Normal range of motion. He exhibits no edema.  Lymphadenopathy:    He has no cervical adenopathy.  Neurological: He is alert and oriented to person, place, and time. He has normal reflexes. No cranial nerve deficit.  Skin: Skin is warm and dry. No rash noted.  Psychiatric: He has a normal mood and affect. His behavior is normal. Judgment and thought content normal.  Nursing note and vitals reviewed.  BP 125/72 (BP Location: Right Arm)   Pulse 70   Temp 98 F (36.7 C) (Oral)   Ht 6' (1.829 m)   Wt 201 lb (91.2 kg)   BMI 27.26 kg/m         Assessment & Plan:  1. Type 2 diabetes mellitus without complication, without long-term current use of insulin (HCC) -Tinny to watch diet and monitor blood sugars when possible. - BMP8+EGFR - CBC with Differential/Platelet - Bayer DCA Hb A1c Waived  2. Vitamin D deficiency - CBC with Differential/Platelet - VITAMIN D 25 Hydroxy (Vit-D Deficiency, Fractures)  3. Essential hypertension -The blood pressure is good today and he will continue with current treatment -He will also continue follow-up with nephrology - BMP8+EGFR - CBC with Differential/Platelet - Hepatic function panel - Lipid panel  4. Healthcare maintenance - Urinalysis, Complete - BMP8+EGFR - CBC with Differential/Platelet - Hepatic function panel - Lipid panel - VITAMIN D 25 Hydroxy (Vit-D Deficiency, Fractures) - Bayer DCA Hb A1c Waived  5. Anemia in stage 4 chronic kidney disease ([place])  6. Benign prostatic hyperplasia without lower urinary tract symptoms -Extremely large and firm prostate without lower urinary tract symptoms.  7. Bilateral recurrent inguinal hernia without obstruction or gangrene -The patient has  had these bilateral hernias for a good while and are not causing him any problems currently.  8. Bradycardia -Follow-up with cardiology as planned  Patient Instructions                       Medicare Annual Wellness Visit  [facility] and the medical providers at [facility] strive to bring you the best medical care.  In doing so we not only want to address your current medical conditions and concerns but also to detect new conditions early and prevent illness, disease and health-related problems.    Medicare offers a yearly Wellness Visit which allows our clinical staff to assess your need for preventative services including immunizations, lifestyle education, counseling to decrease risk of preventable diseases and screening for fall risk and other medical concerns.    This visit is provided free of charge (no copay) for all Medicare recipients. The clinical pharmacists at [facility] have begun to conduct these Wellness Visits which will also include a thorough review of all your medications.    As you primary medical provider recommend that you make an appointment for your Annual Wellness Visit if  you have not done so already this year.  You may set up this appointment before you leave today or you may call back ([phone]) and schedule an appointment.  Please make sure when you call that you mention that you are scheduling your Annual Wellness Visit with the clinical pharmacist so that the appointment may be made for the proper length of time.    Continue current medications. Continue good therapeutic lifestyle changes which include good diet and exercise. Fall precautions discussed with patient. If an FOBT was given today- please return it to our front desk. If you are over 46 years old - you may need Prevnar 23 or the adult Pneumonia vaccine.  **Flu shots are available--- please call and schedule a FLU-CLINIC appointment**  After your visit  with [place] today you will receive a survey in the mail or online from [employer] regarding your care with [place]. Please take a moment to fill this out. Your feedback is very important to [place] as you can help [place] better understand your patient needs as well as improve your experience and satisfaction. WE CARE ABOUT YOU!!!   Continue to follow-up with cardiology and nephrology Continue to watch sodium intake Weigh yourself daily and monitor weights The patient should watch his weight closely and call the cardiologist or his primary care physician if any increasing incidence of shortness of breath or edema in his legs.    Arrie Senate MD

## 2016-09-18 NOTE — Patient Instructions (Addendum)
Medicare Annual Wellness Visit  [facility] and the medical providers at [facility] strive to bring you the best medical care.  In doing so we not only want to address your current medical conditions and concerns but also to detect new conditions early and prevent illness, disease and health-related problems.    Medicare offers a yearly Wellness Visit which allows our clinical staff to assess your need for preventative services including immunizations, lifestyle education, counseling to decrease risk of preventable diseases and screening for fall risk and other medical concerns.    This visit is provided free of charge (no copay) for all Medicare recipients. The clinical pharmacists at [facility] have begun to conduct these Wellness Visits which will also include a thorough review of all your medications.    As you primary medical provider recommend that you make an appointment for your Annual Wellness Visit if you have not done so already this year.  You may set up this appointment before you leave today or you may call back ([phone]) and schedule an appointment.  Please make sure when you call that you mention that you are scheduling your Annual Wellness Visit with the clinical pharmacist so that the appointment may be made for the proper length of time.    Continue current medications. Continue good therapeutic lifestyle changes which include good diet and exercise. Fall precautions discussed with patient. If an FOBT was given today- please return it to our front desk. If you are over 61 years old - you may need Prevnar 29 or the adult Pneumonia vaccine.  **Flu shots are available--- please call and schedule a FLU-CLINIC appointment**  After your visit with [place] today you will receive a survey in the mail or online from [employer] regarding your care with [place]. Please take a moment to fill this out. Your feedback is very  important to [place] as you can help [place] better understand your patient needs as well as improve your experience and satisfaction. WE CARE ABOUT YOU!!!   Continue to follow-up with cardiology and nephrology Continue to watch sodium intake Weigh yourself daily and monitor weights The patient should watch his weight closely and call the cardiologist or his primary care physician if any increasing incidence of shortness of breath or edema in his legs.

## 2016-09-19 LAB — BMP8+EGFR
BUN/Creatinine Ratio: 16 (ref 10–24)
BUN: 31 mg/dL — AB (ref 8–27)
CO2: 24 mmol/L (ref 18–29)
CREATININE: 1.99 mg/dL — AB (ref 0.76–1.27)
Calcium: 9 mg/dL (ref 8.6–10.2)
Chloride: 100 mmol/L (ref 96–106)
GFR calc Af Amer: 35 mL/min/{1.73_m2} — ABNORMAL LOW (ref >59)
GFR calc non Af Amer: 30 mL/min/{1.73_m2} — ABNORMAL LOW (ref >59)
GLUCOSE: 80 mg/dL (ref 65–99)
Potassium: 4.2 mmol/L (ref 3.5–5.2)
Sodium: 138 mmol/L (ref 134–144)

## 2016-09-19 LAB — CBC WITH DIFFERENTIAL/PLATELET
Basophils Absolute: 0.1 10*3/uL (ref 0.0–0.2)
Basos: 1 %
EOS (ABSOLUTE): 0.2 10*3/uL (ref 0.0–0.4)
EOS: 2 %
HEMATOCRIT: 37.9 % (ref 37.5–51.0)
HEMOGLOBIN: 13.1 g/dL (ref 12.6–17.7)
IMMATURE GRANS (ABS): 0 10*3/uL (ref 0.0–0.1)
IMMATURE GRANULOCYTES: 0 %
Lymphocytes Absolute: 1.8 10*3/uL (ref 0.7–3.1)
Lymphs: 20 %
MCH: 30.2 pg (ref 26.6–33.0)
MCHC: 34.6 g/dL (ref 31.5–35.7)
MCV: 87 fL (ref 79–97)
MONOCYTES: 6 %
Monocytes Absolute: 0.5 10*3/uL (ref 0.1–0.9)
NEUTROS PCT: 71 %
Neutrophils Absolute: 6.2 10*3/uL (ref 1.4–7.0)
Platelets: 238 10*3/uL (ref 150–379)
RBC: 4.34 x10E6/uL (ref 4.14–5.80)
RDW: 13.5 % (ref 12.3–15.4)
WBC: 8.7 10*3/uL (ref 3.4–10.8)

## 2016-09-19 LAB — LIPID PANEL
CHOL/HDL RATIO: 4.2 ratio (ref 0.0–5.0)
Cholesterol, Total: 142 mg/dL (ref 100–199)
HDL: 34 mg/dL — AB (ref >39)
LDL Calculated: 88 mg/dL (ref 0–99)
Triglycerides: 100 mg/dL (ref 0–149)
VLDL Cholesterol Cal: 20 mg/dL (ref 5–40)

## 2016-09-19 LAB — HEPATIC FUNCTION PANEL
ALT: 7 IU/L (ref 0–44)
AST: 14 IU/L (ref 0–40)
Albumin: 4.1 g/dL (ref 3.5–4.7)
Alkaline Phosphatase: 82 IU/L (ref 39–117)
Bilirubin Total: 0.4 mg/dL (ref 0.0–1.2)
Bilirubin, Direct: 0.13 mg/dL (ref 0.00–0.40)
Total Protein: 7 g/dL (ref 6.0–8.5)

## 2016-09-19 LAB — VITAMIN D 25 HYDROXY (VIT D DEFICIENCY, FRACTURES): VIT D 25 HYDROXY: 6.4 ng/mL — AB (ref 30.0–100.0)

## 2016-11-04 ENCOUNTER — Other Ambulatory Visit: Payer: Self-pay | Admitting: Family Medicine

## 2017-01-05 ENCOUNTER — Other Ambulatory Visit: Payer: Self-pay | Admitting: Family Medicine

## 2017-01-22 DIAGNOSIS — R809 Proteinuria, unspecified: Secondary | ICD-10-CM | POA: Diagnosis not present

## 2017-01-22 DIAGNOSIS — N183 Chronic kidney disease, stage 3 (moderate): Secondary | ICD-10-CM | POA: Diagnosis not present

## 2017-01-22 DIAGNOSIS — I10 Essential (primary) hypertension: Secondary | ICD-10-CM | POA: Diagnosis not present

## 2017-02-02 ENCOUNTER — Telehealth: Payer: Self-pay | Admitting: Family Medicine

## 2017-02-02 ENCOUNTER — Other Ambulatory Visit: Payer: Self-pay | Admitting: *Deleted

## 2017-02-02 DIAGNOSIS — E559 Vitamin D deficiency, unspecified: Secondary | ICD-10-CM

## 2017-02-02 DIAGNOSIS — D631 Anemia in chronic kidney disease: Secondary | ICD-10-CM

## 2017-02-02 DIAGNOSIS — N183 Chronic kidney disease, stage 3 (moderate): Principal | ICD-10-CM

## 2017-02-02 DIAGNOSIS — E785 Hyperlipidemia, unspecified: Secondary | ICD-10-CM

## 2017-02-02 DIAGNOSIS — E1122 Type 2 diabetes mellitus with diabetic chronic kidney disease: Secondary | ICD-10-CM

## 2017-02-02 DIAGNOSIS — N184 Chronic kidney disease, stage 4 (severe): Secondary | ICD-10-CM

## 2017-02-02 NOTE — Telephone Encounter (Signed)
Orders placed.

## 2017-02-02 NOTE — Telephone Encounter (Signed)
plz put in order for labs this wed.

## 2017-02-04 ENCOUNTER — Other Ambulatory Visit (INDEPENDENT_AMBULATORY_CARE_PROVIDER_SITE_OTHER): Payer: PPO

## 2017-02-04 DIAGNOSIS — E559 Vitamin D deficiency, unspecified: Secondary | ICD-10-CM | POA: Diagnosis not present

## 2017-02-04 DIAGNOSIS — D631 Anemia in chronic kidney disease: Secondary | ICD-10-CM

## 2017-02-04 DIAGNOSIS — N184 Chronic kidney disease, stage 4 (severe): Secondary | ICD-10-CM

## 2017-02-04 DIAGNOSIS — E785 Hyperlipidemia, unspecified: Secondary | ICD-10-CM | POA: Diagnosis not present

## 2017-02-04 DIAGNOSIS — N183 Chronic kidney disease, stage 3 (moderate): Secondary | ICD-10-CM | POA: Diagnosis not present

## 2017-02-04 DIAGNOSIS — E1122 Type 2 diabetes mellitus with diabetic chronic kidney disease: Secondary | ICD-10-CM | POA: Diagnosis not present

## 2017-02-04 LAB — BAYER DCA HB A1C WAIVED: HB A1C (BAYER DCA - WAIVED): 6 % (ref <7.0)

## 2017-02-05 LAB — CBC WITH DIFFERENTIAL/PLATELET
BASOS ABS: 0 10*3/uL (ref 0.0–0.2)
Basos: 0 %
EOS (ABSOLUTE): 0.2 10*3/uL (ref 0.0–0.4)
Eos: 2 %
HEMOGLOBIN: 12.1 g/dL — AB (ref 13.0–17.7)
Hematocrit: 36.1 % — ABNORMAL LOW (ref 37.5–51.0)
Immature Grans (Abs): 0 10*3/uL (ref 0.0–0.1)
Immature Granulocytes: 0 %
LYMPHS ABS: 1.9 10*3/uL (ref 0.7–3.1)
Lymphs: 22 %
MCH: 29.4 pg (ref 26.6–33.0)
MCHC: 33.5 g/dL (ref 31.5–35.7)
MCV: 88 fL (ref 79–97)
MONOCYTES: 5 %
Monocytes Absolute: 0.4 10*3/uL (ref 0.1–0.9)
NEUTROS ABS: 6.3 10*3/uL (ref 1.4–7.0)
Neutrophils: 71 %
Platelets: 263 10*3/uL (ref 150–379)
RBC: 4.12 x10E6/uL — ABNORMAL LOW (ref 4.14–5.80)
RDW: 14.7 % (ref 12.3–15.4)
WBC: 9 10*3/uL (ref 3.4–10.8)

## 2017-02-05 LAB — LIPID PANEL
Chol/HDL Ratio: 4.1 ratio units (ref 0.0–5.0)
Cholesterol, Total: 159 mg/dL (ref 100–199)
HDL: 39 mg/dL — AB (ref >39)
LDL CALC: 103 mg/dL — AB (ref 0–99)
TRIGLYCERIDES: 86 mg/dL (ref 0–149)
VLDL Cholesterol Cal: 17 mg/dL (ref 5–40)

## 2017-02-05 LAB — BMP8+EGFR
BUN/Creatinine Ratio: 17 (ref 10–24)
BUN: 37 mg/dL — ABNORMAL HIGH (ref 8–27)
CO2: 23 mmol/L (ref 18–29)
Calcium: 9.2 mg/dL (ref 8.6–10.2)
Chloride: 103 mmol/L (ref 96–106)
Creatinine, Ser: 2.12 mg/dL — ABNORMAL HIGH (ref 0.76–1.27)
GFR calc Af Amer: 32 mL/min/{1.73_m2} — ABNORMAL LOW (ref >59)
GFR, EST NON AFRICAN AMERICAN: 28 mL/min/{1.73_m2} — AB (ref >59)
Glucose: 102 mg/dL — ABNORMAL HIGH (ref 65–99)
POTASSIUM: 4.7 mmol/L (ref 3.5–5.2)
Sodium: 142 mmol/L (ref 134–144)

## 2017-02-05 LAB — HEPATIC FUNCTION PANEL
ALT: 13 IU/L (ref 0–44)
AST: 13 IU/L (ref 0–40)
Albumin: 4 g/dL (ref 3.5–4.7)
Alkaline Phosphatase: 84 IU/L (ref 39–117)
BILIRUBIN TOTAL: 0.4 mg/dL (ref 0.0–1.2)
Bilirubin, Direct: 0.14 mg/dL (ref 0.00–0.40)
Total Protein: 6.8 g/dL (ref 6.0–8.5)

## 2017-02-05 LAB — VITAMIN D 25 HYDROXY (VIT D DEFICIENCY, FRACTURES): Vit D, 25-Hydroxy: 7.7 ng/mL — ABNORMAL LOW (ref 30.0–100.0)

## 2017-02-05 NOTE — Progress Notes (Signed)
Thanks for keeping me posted on the [facility]! Ruben Conner

## 2017-02-10 NOTE — Telephone Encounter (Signed)
Labs completed on 02-04-17.

## 2017-02-11 ENCOUNTER — Ambulatory Visit (INDEPENDENT_AMBULATORY_CARE_PROVIDER_SITE_OTHER): Payer: PPO

## 2017-02-11 ENCOUNTER — Encounter: Payer: Self-pay | Admitting: Family Medicine

## 2017-02-11 ENCOUNTER — Ambulatory Visit (INDEPENDENT_AMBULATORY_CARE_PROVIDER_SITE_OTHER): Payer: PPO | Admitting: Family Medicine

## 2017-02-11 VITALS — BP 138/66 | HR 85 | Temp 97.6°F | Ht 72.0 in | Wt 203.0 lb

## 2017-02-11 DIAGNOSIS — E119 Type 2 diabetes mellitus without complications: Secondary | ICD-10-CM

## 2017-02-11 DIAGNOSIS — R531 Weakness: Secondary | ICD-10-CM | POA: Diagnosis not present

## 2017-02-11 DIAGNOSIS — N184 Chronic kidney disease, stage 4 (severe): Secondary | ICD-10-CM | POA: Diagnosis not present

## 2017-02-11 DIAGNOSIS — D631 Anemia in chronic kidney disease: Secondary | ICD-10-CM

## 2017-02-11 DIAGNOSIS — R059 Cough, unspecified: Secondary | ICD-10-CM

## 2017-02-11 DIAGNOSIS — R05 Cough: Secondary | ICD-10-CM

## 2017-02-11 DIAGNOSIS — I10 Essential (primary) hypertension: Secondary | ICD-10-CM | POA: Diagnosis not present

## 2017-02-11 DIAGNOSIS — H6121 Impacted cerumen, right ear: Secondary | ICD-10-CM | POA: Diagnosis not present

## 2017-02-11 DIAGNOSIS — E559 Vitamin D deficiency, unspecified: Secondary | ICD-10-CM | POA: Diagnosis not present

## 2017-02-11 DIAGNOSIS — R0989 Other specified symptoms and signs involving the circulatory and respiratory systems: Secondary | ICD-10-CM

## 2017-02-11 NOTE — Addendum Note (Signed)
Addended by: Zannie Cove on: 02/11/2017 03:57 PM   Modules accepted: Orders

## 2017-02-11 NOTE — Progress Notes (Signed)
Subjective:    Patient ID: Ruben Conner, male    DOB: Oct 13, 1930, 83 y.o.   MRN: [id number]  HPI Pt here for follow up and management of chronic medical problems which includes diabetes and hypertension. He is taking medication regularly.The patient has only seen his nephrologist recently and sees him every 6 months. This is Dr. Joelyn Oms. We will make sure that he gets a copy of the most recent blood work. The patient indicates that during the month of February that he was very sick with cough and congestion and just not feeling well at all he is finally started feeling better and his weight is back up. He does have an upcoming appointment with his cardiologist along with his wife seeing him to. Today he denies any chest pain or shortness of breath. He denies any problems now or during the month of February with his intestinal tract including nausea vomiting diarrhea blood in the stool or black tarry bowel movements. He is passing his water without problems. The most recent blood work was reviewed with him and other than the LDL C being slightly increase the remainder of his cholesterol numbers were good with a low HDL. His creatinine was elevated at 2.12 and previously this was 1.99. The blood sugar was 102 with normal electrolytes. All liver function tests were normal. The CBC had a normal white blood cell count and a hemoglobin that was slightly decreased at 12.1 compared to 4 months ago when it was 13.1. The platelet count was adequate. The vitamin D level remains very low and the patient will talk to his nephrologist about whether any vitamin D can be taken with this low vitamin D level and his current creatinine. The hemoglobin A1c was good at 6.0%. All this information was reviewed with the patient he will be given a copy of this to take with him. Because of the chest congestion we will arrange to get a chest x-ray when he leaves today.    Patient Active Problem List   Diagnosis Date Noted  .  Anemia in stage 4 chronic kidney disease ([place]) 09/18/2016  . Vitamin D deficiency 09/18/2016  . Sinus bradycardia 08/31/2014  . First degree AV block 08/31/2014  . LBBB (left bundle branch block) 08/31/2014  . HTN (hypertension) 09/20/2013  . Diabetes ([place]) 05/02/2013  . Hyperlipemia 05/02/2013   Outpatient Encounter Prescriptions as of 02/11/2017  Medication Sig  . amLODipine (NORVASC) 5 MG tablet TAKE (1) TABLET TWICE A DAY.  . benazepril (LOTENSIN) 20 MG tablet TAKE (1) TABLET TWICE A DAY.  . bimatoprost (LUMIGAN) 0.03 % ophthalmic solution 1 drop at bedtime.  . furosemide (LASIX) 40 MG tablet Take 1 tablet by mouth 2 (two) times daily as needed.  Marland Kitchen glimepiride (AMARYL) 4 MG tablet TAKE 1 TABLET DAILY  . hydrochlorothiazide (HYDRODIURIL) 25 MG tablet Take 25 mg by mouth every morning.   . mupirocin ointment (BACTROBAN) 2 % Apply 1 application topically 2 (two) times daily.  . niacin (NIASPAN) 500 MG CR tablet 500 in AM and 1000 in evening  . pioglitazone (ACTOS) 30 MG tablet TAKE 1 TABLET DAILY  . potassium chloride SA (K-DUR,KLOR-CON) 20 MEQ tablet TAKE 1 TABLET DAILY   No facility-administered encounter medications on file as of 02/11/2017.       Review of Systems  Constitutional: Negative.   HENT: Negative.   Eyes: Negative.   Respiratory: Negative.   Cardiovascular: Negative.   Gastrointestinal: Negative.   Endocrine: Negative.   Genitourinary:  Negative.   Musculoskeletal: Negative.   Skin: Negative.   Allergic/Immunologic: Negative.   Neurological: Negative.   Hematological: Negative.   Psychiatric/Behavioral: Negative.        Objective:   Physical Exam  Constitutional: He is oriented to person, place, and time. He appears well-developed and well-nourished. No distress.  The patient is pleasant and alert. He says he is feeling better than he did during the month of February when he felt so bad.  HENT:  Head: Normocephalic and atraumatic.  Right Ear: External  ear normal.  Left Ear: External ear normal.  Nose: Nose normal.  Mouth/Throat: Oropharynx is clear and moist. No oropharyngeal exudate.  Eyes: Conjunctivae and EOM are normal. Pupils are equal, round, and reactive to light. Right eye exhibits no discharge. Left eye exhibits no discharge. No scleral icterus.  Neck: Normal range of motion. Neck supple. No thyromegaly present.  Bilateral bruits.  Cardiovascular: Normal rate, regular rhythm and intact distal pulses.   Murmur heard. The heart has a regular rate and rhythm at 84/m. There is a systolic ejection murmur.  Pulmonary/Chest: Effort normal and breath sounds normal. No respiratory distress. He has no wheezes. He has no rales. He exhibits no tenderness.  The chest is clear anteriorly and posteriorly and there is no increased congestion or tightness with coughing. There are good breath sounds bilaterally and anteriorly and posteriorly.  Abdominal: Soft. Bowel sounds are normal. He exhibits no mass. There is no tenderness. There is no rebound and no guarding.  Musculoskeletal: Normal range of motion. He exhibits no edema.  Lymphadenopathy:    He has no cervical adenopathy.  Neurological: He is alert and oriented to person, place, and time. He has normal reflexes. No cranial nerve deficit.  Skin: Skin is warm and dry. No rash noted.  Psychiatric: He has a normal mood and affect. His behavior is normal. Judgment and thought content normal.  Nursing note and vitals reviewed.  BP 138/66   Pulse 85   Temp 97.6 F (36.4 C) (Oral)   Ht 6' (1.829 m)   Wt 203 lb (92.1 kg)   BMI 27.53 kg/m       Assessment & Plan:  1. Type 2 diabetes mellitus without complication, without long-term current use of insulin (HCC) -The patient's A1c is excellent and he will continue with his current diabetic  2. Vitamin D deficiency -Vitamin D level is low and we will not change or add to his current treatment plan and he will discuss this further with his  nephrologist the next time he sees him.  3. Essential hypertension -The blood pressure is good today on repeat at 138/66.  4. Right ear impacted cerumen -We will do an ear irrigation on the right.  5. Weakness -The patient had some severe weakness along with chest congestion and cough during the month of February and did not go to the doctor. He is feeling some better from this. The recent lab work and CBC done did not show any sign of any infection currently even though his hemoglobin is a little bit lower.  6. Chest congestion -This has improved.  7. Cough -This is improving. -Chest x-ray today.  8. Anemia in stage 4 chronic kidney disease ([place]) -Follow-up with nephrology as planned.  Patient Instructions                       Medicare Annual Wellness Visit  [facility] and the medical providers at [facility] strive to bring you the best medical care.  In doing so we not only want to address your current medical conditions and concerns but also to detect new conditions early and prevent illness, disease and health-related problems.    Medicare offers a yearly Wellness Visit which allows our clinical staff to assess your need for preventative services including immunizations, lifestyle education, counseling to decrease risk of preventable diseases and screening for fall risk and other medical concerns.    This visit is provided free of charge (no copay) for all Medicare recipients. The clinical pharmacists at [facility] have begun to conduct these Wellness Visits which will also include a thorough review of all your medications.    As you primary medical provider recommend that you make an appointment for your Annual Wellness Visit if you have not done so already this year.  You may set up this appointment before you leave today or you may call back ([phone]) and schedule an appointment.  Please make sure when you call that you mention  that you are scheduling your Annual Wellness Visit with the clinical pharmacist so that the appointment may be made for the proper length of time.     Continue current medications. Continue good therapeutic lifestyle changes which include good diet and exercise. Fall precautions discussed with patient. If an FOBT was given today- please return it to our front desk. If you are over 66 years old - you may need Prevnar 36 or the adult Pneumonia vaccine.  **Flu shots are available--- please call and schedule a FLU-CLINIC appointment**  After your visit with [place] today you will receive a survey in the mail or online from [employer] regarding your care with [place]. Please take a moment to fill this out. Your feedback is very important to [place] as you can help [place] better understand your patient needs as well as improve your experience and satisfaction. WE CARE ABOUT YOU!!!     Arrie Senate MD

## 2017-02-11 NOTE — Patient Instructions (Signed)
Medicare Annual Wellness Visit  [place] and the medical providers at [facility] strive to bring you the best medical care.  In doing so we not only want to address your current medical conditions and concerns but also to detect new conditions early and prevent illness, disease and health-related problems.    Medicare offers a yearly Wellness Visit which allows our clinical staff to assess your need for preventative services including immunizations, lifestyle education, counseling to decrease risk of preventable diseases and screening for fall risk and other medical concerns.    This visit is provided free of charge (no copay) for all Medicare recipients. The clinical pharmacists at [facility] have begun to conduct these Wellness Visits which will also include a thorough review of all your medications.    As you primary medical provider recommend that you make an appointment for your Annual Wellness Visit if you have not done so already this year.  You may set up this appointment before you leave today or you may call back ([phone]) and schedule an appointment.  Please make sure when you call that you mention that you are scheduling your Annual Wellness Visit with the clinical pharmacist so that the appointment may be made for the proper length of time.     Continue current medications. Continue good therapeutic lifestyle changes which include good diet and exercise. Fall precautions discussed with patient. If an FOBT was given today- please return it to our front desk. If you are over 50 years old - you may need Prevnar 13 or the adult Pneumonia vaccine.  **Flu shots are available--- please call and schedule a FLU-CLINIC appointment**  After your visit with us today you will receive a survey in the mail or online from [employer] regarding your care with us. Please take a moment to fill this out. Your feedback is very  important to us as you can help us better understand your patient needs as well as improve your experience and satisfaction. WE CARE ABOUT YOU!!!    

## 2017-02-12 ENCOUNTER — Other Ambulatory Visit: Payer: Self-pay | Admitting: *Deleted

## 2017-02-12 DIAGNOSIS — R918 Other nonspecific abnormal finding of lung field: Secondary | ICD-10-CM

## 2017-02-12 DIAGNOSIS — R9389 Abnormal findings on diagnostic imaging of other specified body structures: Secondary | ICD-10-CM

## 2017-02-16 ENCOUNTER — Other Ambulatory Visit: Payer: PPO

## 2017-02-16 DIAGNOSIS — Z1211 Encounter for screening for malignant neoplasm of colon: Secondary | ICD-10-CM | POA: Diagnosis not present

## 2017-02-18 LAB — FECAL OCCULT BLOOD, IMMUNOCHEMICAL: FECAL OCCULT BLD: NEGATIVE

## 2017-02-26 ENCOUNTER — Encounter (HOSPITAL_COMMUNITY): Payer: Self-pay

## 2017-02-26 ENCOUNTER — Other Ambulatory Visit: Payer: Self-pay | Admitting: Family Medicine

## 2017-02-26 ENCOUNTER — Ambulatory Visit (HOSPITAL_COMMUNITY)
Admission: RE | Admit: 2017-02-26 | Discharge: 2017-02-26 | Disposition: A | Discharge status: Home / Self Care | Payer: PPO | Source: Ambulatory Visit | Attending: Family Medicine | Admitting: Family Medicine

## 2017-02-26 DIAGNOSIS — R938 Abnormal findings on diagnostic imaging of other specified body structures: Secondary | ICD-10-CM | POA: Insufficient documentation

## 2017-02-26 DIAGNOSIS — R918 Other nonspecific abnormal finding of lung field: Secondary | ICD-10-CM

## 2017-02-26 DIAGNOSIS — R9389 Abnormal findings on diagnostic imaging of other specified body structures: Secondary | ICD-10-CM

## 2017-02-26 HISTORY — DX: Disorder of kidney and ureter, unspecified: N28.9

## 2017-02-26 LAB — POCT I-STAT CREATININE: Creatinine, Ser: 2.4 mg/dL — ABNORMAL HIGH (ref 0.61–1.24)

## 2017-02-27 ENCOUNTER — Ambulatory Visit (INDEPENDENT_AMBULATORY_CARE_PROVIDER_SITE_OTHER): Payer: PPO | Admitting: Family Medicine

## 2017-02-27 ENCOUNTER — Encounter: Payer: Self-pay | Admitting: Family Medicine

## 2017-02-27 DIAGNOSIS — R911 Solitary pulmonary nodule: Secondary | ICD-10-CM | POA: Diagnosis not present

## 2017-02-27 NOTE — Progress Notes (Signed)
   There were no vitals taken for this visit.   Subjective:    Patient ID: Ruben Conner, male    DOB: 22-Dec-1929, 83 y.o.   MRN: [id number]  HPI: Ruben Conner is a 83 y.o. male presenting on 02/27/2017 for Discuss CT results   HPI Discuss CT scan results for lung nodule Patient had a CT chest done because of an abnormal x-ray and the CT chest came back and showed that he had perihilar nodules that were concerning for possible lung cancer and patient was brought in today so we can discuss this in person with him. The labs and imaging were initially ordered by Dr. Laurance Flatten but since he is out we are covering for him and discussing this with the patient today. Patient is understandable as to what he is being told and understands the plan for referrals and going forward from this point. The patient took the news rather well and says that he just needs to find somebody to watch his wife when he goes towards the referral and following up on this. He was having a lot of congestion that was not improving but he says that's all resolved and he is feeling a lot better. He denies any shortness of breath or wheezing or major coughing spells.  Relevant past medical, surgical, family and social history reviewed and updated as indicated. Interim medical history since our last visit reviewed. Allergies and medications reviewed and updated.  Review of Systems  Constitutional: Negative for chills and fever.  HENT: Negative for congestion.   Respiratory: Negative for cough, shortness of breath and wheezing.   Cardiovascular: Negative for chest pain and leg swelling.  Musculoskeletal: Negative for back pain and gait problem.  Skin: Negative for rash.  All other systems reviewed and are negative.   Per HPI unless specifically indicated above        Objective:    There were no vitals taken for this visit.  Wt Readings from Last 3 Encounters:  02/11/17 203 lb (92.1 kg)  09/18/16 201 lb (91.2 kg)    05/15/16 204 lb (92.5 kg)    Physical Exam  Constitutional: He appears well-developed and well-nourished. No distress.  Eyes: Conjunctivae are normal. No scleral icterus.  Musculoskeletal: Normal range of motion.  Neurological: He is alert. Coordination normal.  Skin: He is not diaphoretic.  Psychiatric: He has a normal mood and affect. His behavior is normal.  Nursing note and vitals reviewed.      Assessment & Plan:   Problem List Items Addressed This Visit    None    Visit Diagnoses    Lung nodule seen on imaging study    -  Primary   Relevant Orders   Ambulatory referral to Pulmonology   Ambulatory referral to Oncology       Follow up plan: Return if symptoms worsen or fail to improve.  Counseling provided for all of the vaccine components Orders Placed This Encounter  Procedures  . Ambulatory referral to Pulmonology  . Ambulatory referral to [place], MD [facility] 02/27/2017, 11:18 AM

## 2017-03-02 ENCOUNTER — Ambulatory Visit: Payer: PPO | Admitting: Family Medicine

## 2017-03-02 ENCOUNTER — Telehealth: Payer: Self-pay | Admitting: *Deleted

## 2017-03-02 NOTE — Telephone Encounter (Signed)
Oncology Nurse Navigator Documentation  Oncology Nurse Navigator Flowsheets 03/02/2017  Navigator Location [facility]  Referral date to RadOnc/MedOnc 03/02/2017  Navigator Encounter Type Telephone/I received referral on Mr. Ruben Conner today.  I called him to schedule with [place] on 03/19/17.  Patient states he would like to see [name].  I will updated HIM to schedule with [name].   Telephone Outgoing Call  Treatment Phase Abnormal Scans  Barriers/Navigation Needs Coordination of Care  Interventions Coordination of Care  Coordination of Care Other  Acuity Level 2  Time Spent with Patient 15

## 2017-03-03 ENCOUNTER — Encounter: Payer: Self-pay | Admitting: *Deleted

## 2017-03-03 ENCOUNTER — Encounter: Payer: Self-pay | Admitting: Oncology

## 2017-03-03 NOTE — Progress Notes (Signed)
Oncology Nurse Navigator Documentation  Oncology Nurse Navigator Flowsheets 03/02/2017  Navigator Location [facility]  Referral date to RadOnc/MedOnc 03/02/2017  Navigator Encounter Type Telephone/I called HIM and updated Seth Bake on patient's request to see Dr. Benay Spice for a new patient appt  Telephone Outgoing Call  Treatment Phase Abnormal Scans  Barriers/Navigation Needs Coordination of Care  Interventions Coordination of Care  Coordination of Care Other  Acuity Level 2  Time Spent with Patient 15

## 2017-03-10 ENCOUNTER — Encounter: Payer: Self-pay | Admitting: Pulmonary Disease

## 2017-03-10 ENCOUNTER — Ambulatory Visit (INDEPENDENT_AMBULATORY_CARE_PROVIDER_SITE_OTHER): Payer: PPO | Admitting: Pulmonary Disease

## 2017-03-10 VITALS — BP 124/72 | HR 74 | Ht 73.0 in | Wt 203.0 lb

## 2017-03-10 DIAGNOSIS — R590 Localized enlarged lymph nodes: Secondary | ICD-10-CM | POA: Insufficient documentation

## 2017-03-10 DIAGNOSIS — R918 Other nonspecific abnormal finding of lung field: Secondary | ICD-10-CM

## 2017-03-10 NOTE — Patient Instructions (Signed)
PET scan  Based on this , we will set up bronchoscopy with biopsy

## 2017-03-10 NOTE — Progress Notes (Signed)
Subjective:    Patient ID: Ruben Conner, male    DOB: 1929/12/17, 83 y.o.   MRN: [id number]  HPI  Chief Complaint  Patient presents with  . Pulm Consult    Was recently diagnosed with lung cancer. 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 He is accompanied by his son, Rosalita Chessman today.  He developed chest congestion in February 2018. His initial chest x-ray 2/23 did not show any active lung disease. When symptoms persisted chest x-ray was repeated which showed right hilar prominence. He then underwent CT chest without contrast, since his creatinine was high at 2.4 on 02/26/17. This showed a right hilar soft tissue mass 6.64.8 cm which narrowed the superior vena cava and the right upper lobe bronchus. Scattered mediastinal lymph nodes were noted largest precarinal lymph node measuring 13 mm. A right lower lobe subpleural nodule about 5 mm was noted there was also a calcified granuloma in the right upper lobe.  He has past medical history of hypertension, diabetes and hyperlipidemia and CK D ([facility] )  Patient improved after taking antibiotics and is currently asymptomatic. He denies dyspnea, cough, hemoptysis or wheezing. Denies chest pain, orthopnea or paroxysmal nocturnal dyspnea. He has no fevers or weight loss. He quit smoking about 3 months ago. Prior to this he smoked about a pack per day for more than 40 years. [place] He did score quit smoking for 17 years somewhere in between.    Past Medical History:  Diagnosis Date  . Diabetes mellitus without complication ([place])   . Hyperlipidemia   . Hypertension   . Irregular heart rate   . Renal insufficiency      Past Surgical History:  Procedure Laterality Date  . EYE SURGERY    . MOUTH SURGERY    . ROTATOR CUFF REPAIR    . tumor remoced from neck      No Known Allergies    Social History    Social History  . Marital status: Married    Spouse name: N/A  . Number of children: 1  . Years of education: N/A   Occupational History  . Retired Health and safety inspector    Social History Main Topics  . Smoking status: Current Some Day Smoker    Packs/day: 0.10    Types: Cigarettes  . Smokeless tobacco: Never Used  . Alcohol use No  . Drug use: No  . Sexual activity: Not on file   Other Topics Concern  . Not on file   Social History Narrative  . No narrative on file      Family History  Problem Relation Age of Onset  . Kidney disease Mother   . Heart disease Father     Review of Systems Constitutional: negative for anorexia, fevers and sweats  Eyes: negative for irritation, redness and visual disturbance  Ears, nose, mouth, throat, and face: negative for earaches, epistaxis, nasal congestion and sore throat  Respiratory: negative for cough, dyspnea on exertion, sputum and wheezing  Cardiovascular: negative for chest pain, dyspnea, lower extremity edema, orthopnea, palpitations and syncope  Gastrointestinal: negative for abdominal pain, constipation, diarrhea, melena, nausea and vomiting  Genitourinary:negative for dysuria, frequency and hematuria  Hematologic/lymphatic: negative for bleeding, easy bruising and lymphadenopathy  Musculoskeletal:negative for arthralgias, muscle weakness and stiff joints  Neurological: negative for coordination problems, gait problems, headaches and  weakness  Endocrine: negative for diabetic symptoms including polydipsia, polyuria and weight loss     Objective:   Physical Exam   Gen. Pleasant, well-nourished, in no distress, normal affect ENT - no lesions, no post nasal drip Neck: No JVD, no thyromegaly, no carotid bruits Lungs: no use of accessory muscles, no dullness to percussion, clear without rales or rhonchi  Cardiovascular: Rhythm regular, heart sounds  normal, no murmurs or gallops, no peripheral edema Abdomen: soft and non-tender,  no hepatosplenomegaly, BS normal. Musculoskeletal: No deformities, no cyanosis or clubbing Neuro:  alert, non focal      Assessment & Plan:

## 2017-03-10 NOTE — Assessment & Plan Note (Signed)
Unfortunately, right hilar mass with precarinal lymphadenopathy does appear to be malignant. Since this is a noncontrast CT scan, cannot comment on other mediastinal lymphadenopathy present. He does not seem to have supraclavicular lymphadenopathy on exam.  We will proceed with PET scan to better characterize and stage mediastinum as well as look for remote metastases.  If no other lesion found then he will likely need a bronchoscopy with endobronchial ultrasound to inspect the right upper lobe and likely take aspiration biopsies from the suprahilar mass.  The various options of biopsy including bronchoscopy, CT guided needle aspiration and surgical biopsy were discussed.The risks of each procedure including coughing, bleeding and the  chances of lung puncture requiring chest tube were discussed in great detail. The benefits & alternatives including serial follow up were also discussed.

## 2017-03-12 ENCOUNTER — Other Ambulatory Visit: Payer: Self-pay | Admitting: Pulmonary Disease

## 2017-03-17 ENCOUNTER — Telehealth: Payer: Self-pay | Admitting: Pulmonary Disease

## 2017-03-17 NOTE — Telephone Encounter (Signed)
Spoke with pt, relayed appt date/time for both PET and biopsy which were already scheduled.  Pt expressed understanding.  Nothing further needed at this time.

## 2017-03-19 ENCOUNTER — Ambulatory Visit (HOSPITAL_BASED_OUTPATIENT_CLINIC_OR_DEPARTMENT_OTHER): Payer: PPO | Admitting: Oncology

## 2017-03-19 ENCOUNTER — Telehealth: Payer: Self-pay | Admitting: Oncology

## 2017-03-19 ENCOUNTER — Encounter (HOSPITAL_COMMUNITY): Payer: Self-pay | Admitting: *Deleted

## 2017-03-19 VITALS — BP 156/66 | HR 68 | Temp 98.2°F | Resp 18 | Ht 73.0 in | Wt 203.3 lb

## 2017-03-19 DIAGNOSIS — R918 Other nonspecific abnormal finding of lung field: Secondary | ICD-10-CM

## 2017-03-19 NOTE — Telephone Encounter (Signed)
Appointments scheduled per 5.3.18 LOS. Patient given AVS report and calendars with future scheduled appointments. °

## 2017-03-19 NOTE — Progress Notes (Signed)
[place] New Patient Consult   Referring MD: Ruben Conner 83 y.o.  1930/07/08    Reason for Referral: Right hilar mass, mediastinal adenopathy reports having an episode of congestion in February 2017. A chest x-ray was negative. He reports having a routine chest x-ray on 02/11/2017. This revealed new right hilar fullness. He was referred for a CT of the chest on 02/26/2017. Multiple mediastinal lymph nodes were noted including a 13.5 mm precarinal node. A soft tissue mass was noted at the right hilum with attenuation of the right upper lobe bronchus. A 5 mm right lower lobe subpleural nodule is noted. The upper abdomen is unremarkable. He was referred to Dr. Elsworth Soho and is scheduled for a bronchoscopy on 03/23/2017. He is scheduled for a PET scan tomorrow. [name] feels well.   HPI: [name]  Past Medical History:  Diagnosis Date  . Diabetes mellitus without complication ([place])   . Hyperlipidemia   . Hypertension   . Irregular heart rate-Left bundle branch block, bradycardia, prolonged A-V conduction    . Renal insufficiency     Past Surgical History:  Procedure Laterality Date  . Bilateral cataract surgery     . MOUTH SURGERY    . ROTATOR CUFF REPAIR    . tumor remoced from neck      .  Salivary gland tumor  Medications: Reviewed  Allergies: No Known Allergies  Family history: 2 brothers and one sister. 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 He lives with his wife in [place]. He quit smoking cigarettes 3 months ago. He reports rare alcohol use. No risk factor for HIV or hepatitis.  ROS:   Positives include: None  A complete ROS was otherwise negative.  Physical Exam:  Blood pressure (!) 156/66, pulse 68, temperature 98.2 F (36.8 C), temperature source Oral, resp. rate 18, height '6\' 1"'$  (1.854 m), weight 203 lb 4.8 oz (92.2 kg), SpO2 98 %.  HEENT: Oropharynx without visible  mass, neck without mass Lungs: Clear bilaterally, no respiratory distress Cardiac: Regular rate and rhythm, distant heart sounds Abdomen: No hepatosplenomegaly, no mass, nontender GU: Enlargement of the testis bilaterally (he reports this has been evaluated by urology) , no discrete mass  Vascular: No leg edema Lymph nodes: No cervical, supraclavicular, axillary, or inguinal nodes Neurologic: Alert and oriented, the motor exam appears intact in the upper and lower extremities Skin: No rash Musculoskeletal: No spine tenderness   LAB:  CBC  Lab Results  Component Value Date   WBC 9.0 02/04/2017   HGB 14.0 (A) 04/24/2015   HCT 36.1 (L) 02/04/2017   MCV 88 02/04/2017   PLT 263 02/04/2017   NEUTROABS 6.3 02/04/2017        CMP     Component Value Date/Time   NA 142 02/04/2017 0957   K 4.7 02/04/2017 0957   CL 103 02/04/2017 0957   CO2 23 02/04/2017 0957   GLUCOSE 102 (H) 02/04/2017 0957   BUN 37 (H) 02/04/2017 0957   CREATININE 2.40 (H) 02/26/2017 1444   CALCIUM 9.2 02/04/2017 0957   PROT 6.8 02/04/2017 0957   ALBUMIN 4.0 02/04/2017 0957   AST 13 02/04/2017 0957   ALT 13 02/04/2017 0957   ALKPHOS 84 02/04/2017 0957   BILITOT 0.4 02/04/2017 0957   GFRNONAA 28 (L) 02/04/2017 0957   GFRAA 32 (L) 02/04/2017 0957    Imaging:  CT chest 02/26/2017-images  were reviewed with [name] and his son   Assessment/Plan:   1. Right hilar mass, mediastinal adenopathy  CT chest 02/26/2017 with a right hilar/suprahilar mass and enlargement of several lymph nodes  2. Diabetes 3. Renal insufficiency 4. History of tobacco use   Disposition:   [name] has a right hilar mass and mediastinal lymphadenopathy. He most likely has lung cancer. There is no clinical or x-ray evidence of distant metastatic disease. He could have small cell or non-small cell carcinoma. The differential diagnosis includes lymphoma and metastatic disease from another site.  He is scheduled for a  staging PET scan 2020/01/1817 and a diagnostic bronchoscopy on 03/23/2017. He will return for an office visit and further discussion on 03/26/2017.  50 minutes were spent with the patient today. The majority of the time was used for counseling and coordination of care.  Betsy Coder, MD  03/19/2017, 4:53 PM

## 2017-03-20 ENCOUNTER — Ambulatory Visit (HOSPITAL_COMMUNITY)
Admission: RE | Admit: 2017-03-20 | Discharge: 2017-03-20 | Disposition: A | Discharge status: Home / Self Care | Payer: PPO | Source: Ambulatory Visit | Attending: Pulmonary Disease | Admitting: Pulmonary Disease

## 2017-03-20 DIAGNOSIS — I700 Atherosclerosis of aorta: Secondary | ICD-10-CM | POA: Insufficient documentation

## 2017-03-20 DIAGNOSIS — R938 Abnormal findings on diagnostic imaging of other specified body structures: Secondary | ICD-10-CM | POA: Diagnosis not present

## 2017-03-20 DIAGNOSIS — I517 Cardiomegaly: Secondary | ICD-10-CM | POA: Insufficient documentation

## 2017-03-20 DIAGNOSIS — K4020 Bilateral inguinal hernia, without obstruction or gangrene, not specified as recurrent: Secondary | ICD-10-CM | POA: Diagnosis not present

## 2017-03-20 DIAGNOSIS — Z981 Arthrodesis status: Secondary | ICD-10-CM | POA: Diagnosis not present

## 2017-03-20 DIAGNOSIS — R590 Localized enlarged lymph nodes: Secondary | ICD-10-CM | POA: Diagnosis not present

## 2017-03-20 DIAGNOSIS — I2510 Atherosclerotic heart disease of native coronary artery without angina pectoris: Secondary | ICD-10-CM | POA: Insufficient documentation

## 2017-03-20 DIAGNOSIS — R918 Other nonspecific abnormal finding of lung field: Secondary | ICD-10-CM | POA: Insufficient documentation

## 2017-03-20 DIAGNOSIS — K8020 Calculus of gallbladder without cholecystitis without obstruction: Secondary | ICD-10-CM | POA: Insufficient documentation

## 2017-03-20 DIAGNOSIS — J432 Centrilobular emphysema: Secondary | ICD-10-CM | POA: Insufficient documentation

## 2017-03-20 DIAGNOSIS — K118 Other diseases of salivary glands: Secondary | ICD-10-CM | POA: Insufficient documentation

## 2017-03-20 LAB — GLUCOSE, CAPILLARY: GLUCOSE-CAPILLARY: 107 mg/dL — AB (ref 65–99)

## 2017-03-20 MED ORDER — FLUDEOXYGLUCOSE F - 18 (FDG) INJECTION: 10.0000 | Freq: Once | INTRAVENOUS | Status: DC | PRN

## 2017-03-23 ENCOUNTER — Ambulatory Visit (HOSPITAL_COMMUNITY)
Admission: RE | Admit: 2017-03-23 | Discharge: 2017-03-23 | Disposition: A | Discharge status: Home / Self Care | Payer: PPO | Source: Ambulatory Visit | Attending: Pulmonary Disease | Admitting: Pulmonary Disease

## 2017-03-23 ENCOUNTER — Encounter (HOSPITAL_COMMUNITY)
Admission: RE | Disposition: A | Discharge status: Home / Self Care | Payer: Self-pay | Source: Ambulatory Visit | Attending: Pulmonary Disease

## 2017-03-23 ENCOUNTER — Ambulatory Visit (HOSPITAL_COMMUNITY): Payer: PPO | Admitting: Anesthesiology

## 2017-03-23 ENCOUNTER — Encounter (HOSPITAL_COMMUNITY): Payer: Self-pay

## 2017-03-23 DIAGNOSIS — Z8249 Family history of ischemic heart disease and other diseases of the circulatory system: Secondary | ICD-10-CM | POA: Insufficient documentation

## 2017-03-23 DIAGNOSIS — C3492 Malignant neoplasm of unspecified part of left bronchus or lung: Secondary | ICD-10-CM | POA: Diagnosis not present

## 2017-03-23 DIAGNOSIS — Z9889 Other specified postprocedural states: Secondary | ICD-10-CM | POA: Diagnosis not present

## 2017-03-23 DIAGNOSIS — N189 Chronic kidney disease, unspecified: Secondary | ICD-10-CM | POA: Insufficient documentation

## 2017-03-23 DIAGNOSIS — R918 Other nonspecific abnormal finding of lung field: Secondary | ICD-10-CM | POA: Diagnosis not present

## 2017-03-23 DIAGNOSIS — E785 Hyperlipidemia, unspecified: Secondary | ICD-10-CM | POA: Diagnosis not present

## 2017-03-23 DIAGNOSIS — Z87891 Personal history of nicotine dependence: Secondary | ICD-10-CM | POA: Diagnosis not present

## 2017-03-23 DIAGNOSIS — Z841 Family history of disorders of kidney and ureter: Secondary | ICD-10-CM | POA: Insufficient documentation

## 2017-03-23 DIAGNOSIS — R590 Localized enlarged lymph nodes: Secondary | ICD-10-CM | POA: Insufficient documentation

## 2017-03-23 DIAGNOSIS — E1122 Type 2 diabetes mellitus with diabetic chronic kidney disease: Secondary | ICD-10-CM | POA: Diagnosis not present

## 2017-03-23 DIAGNOSIS — I129 Hypertensive chronic kidney disease with stage 1 through stage 4 chronic kidney disease, or unspecified chronic kidney disease: Secondary | ICD-10-CM | POA: Diagnosis not present

## 2017-03-23 DIAGNOSIS — E119 Type 2 diabetes mellitus without complications: Secondary | ICD-10-CM | POA: Diagnosis not present

## 2017-03-23 DIAGNOSIS — I10 Essential (primary) hypertension: Secondary | ICD-10-CM | POA: Diagnosis not present

## 2017-03-23 DIAGNOSIS — R599 Enlarged lymph nodes, unspecified: Secondary | ICD-10-CM | POA: Diagnosis not present

## 2017-03-23 DIAGNOSIS — C3411 Malignant neoplasm of upper lobe, right bronchus or lung: Secondary | ICD-10-CM

## 2017-03-23 HISTORY — PX: ENDOBRONCHIAL ULTRASOUND: SHX5096

## 2017-03-23 LAB — POCT I-STAT 4, (NA,K, GLUC, HGB,HCT)
Glucose, Bld: 91 mg/dL (ref 65–99)
HCT: 32 % — ABNORMAL LOW (ref 39.0–52.0)
Hemoglobin: 10.9 g/dL — ABNORMAL LOW (ref 13.0–17.0)
Potassium: 3.7 mmol/L (ref 3.5–5.1)
Sodium: 141 mmol/L (ref 135–145)

## 2017-03-23 LAB — GLUCOSE, CAPILLARY: Glucose-Capillary: 90 mg/dL (ref 65–99)

## 2017-03-23 SURGERY — ENDOBRONCHIAL ULTRASOUND (EBUS)
Anesthesia: General | Laterality: Bilateral

## 2017-03-23 MED ORDER — LIDOCAINE 2% (20 MG/ML) 5 ML SYRINGE
INTRAMUSCULAR | Status: DC | PRN
  Administered 2017-03-23: 100 mg via INTRAVENOUS

## 2017-03-23 MED ORDER — ONDANSETRON HCL 4 MG/2ML IJ SOLN
INTRAMUSCULAR | Status: AC
  Filled 2017-03-23: qty 2

## 2017-03-23 MED ORDER — PROPOFOL 10 MG/ML IV BOLUS
INTRAVENOUS | Status: DC | PRN
  Administered 2017-03-23: 150 mg via INTRAVENOUS

## 2017-03-23 MED ORDER — PROPOFOL 10 MG/ML IV BOLUS
INTRAVENOUS | Status: AC
  Filled 2017-03-23: qty 20

## 2017-03-23 MED ORDER — FENTANYL CITRATE (PF) 100 MCG/2ML IJ SOLN
INTRAMUSCULAR | Status: AC
  Filled 2017-03-23: qty 2

## 2017-03-23 MED ORDER — FENTANYL CITRATE (PF) 100 MCG/2ML IJ SOLN: 25.0000 ug | INTRAMUSCULAR | Status: DC | PRN

## 2017-03-23 MED ORDER — OXYCODONE HCL 5 MG PO TABS: 5.0000 mg | ORAL_TABLET | Freq: Once | ORAL | Status: DC | PRN

## 2017-03-23 MED ORDER — ONDANSETRON HCL 4 MG/2ML IJ SOLN: 4.0000 mg | Freq: Once | INTRAMUSCULAR | Status: DC | PRN

## 2017-03-23 MED ORDER — OXYCODONE HCL 5 MG/5ML PO SOLN: 5.0000 mg | Freq: Once | ORAL | Status: DC | PRN

## 2017-03-23 MED ORDER — ROCURONIUM BROMIDE 10 MG/ML (PF) SYRINGE
PREFILLED_SYRINGE | INTRAVENOUS | Status: DC | PRN
  Administered 2017-03-23: 20 mg via INTRAVENOUS

## 2017-03-23 MED ORDER — SUGAMMADEX SODIUM 200 MG/2ML IV SOLN
INTRAVENOUS | Status: AC
  Filled 2017-03-23: qty 2

## 2017-03-23 MED ORDER — ROCURONIUM BROMIDE 50 MG/5ML IV SOSY
PREFILLED_SYRINGE | INTRAVENOUS | Status: AC
  Filled 2017-03-23: qty 5

## 2017-03-23 MED ORDER — FENTANYL CITRATE (PF) 100 MCG/2ML IJ SOLN
INTRAMUSCULAR | Status: DC | PRN
  Administered 2017-03-23 (×2): 50 ug via INTRAVENOUS

## 2017-03-23 MED ORDER — SUGAMMADEX SODIUM 200 MG/2ML IV SOLN
INTRAVENOUS | Status: DC | PRN
  Administered 2017-03-23: 200 mg via INTRAVENOUS

## 2017-03-23 MED ORDER — LACTATED RINGERS IV SOLN
INTRAVENOUS | Status: DC
  Administered 2017-03-23 (×2): via INTRAVENOUS

## 2017-03-23 MED ORDER — ONDANSETRON HCL 4 MG/2ML IJ SOLN
INTRAMUSCULAR | Status: DC | PRN
  Administered 2017-03-23: 4 mg via INTRAVENOUS

## 2017-03-23 MED ORDER — LIDOCAINE 2% (20 MG/ML) 5 ML SYRINGE
INTRAMUSCULAR | Status: AC
  Filled 2017-03-23: qty 5

## 2017-03-23 MED ORDER — SUCCINYLCHOLINE CHLORIDE 200 MG/10ML IV SOSY
PREFILLED_SYRINGE | INTRAVENOUS | Status: DC | PRN
  Administered 2017-03-23: 120 mg via INTRAVENOUS

## 2017-03-23 NOTE — Anesthesia Preprocedure Evaluation (Signed)
Anesthesia Evaluation  Patient identified by MRN, date of birth, ID band Patient awake    Reviewed: Allergy & Precautions, NPO status , Patient's Chart, lab work & pertinent test results  Airway Mallampati: II  TM Distance: >3 FB Neck ROM: Full    Dental  (+) Teeth Intact, Dental Advisory Given   Pulmonary former smoker,    breath sounds clear to auscultation       Cardiovascular hypertension,  Rhythm:Regular Rate:Normal     Neuro/Psych    GI/Hepatic   Endo/Other  diabetes  Renal/GU      Musculoskeletal   Abdominal   Peds  Hematology   Anesthesia Other Findings   Reproductive/Obstetrics                             Anesthesia Physical Anesthesia Plan  ASA: III  Anesthesia Plan: General   Post-op Pain Management:    Induction: Intravenous  Airway Management Planned: Oral ETT  Additional Equipment:   Intra-op Plan:   Post-operative Plan: Extubation in OR  Informed Consent: I have reviewed the patients History and Physical, chart, labs and discussed the procedure including the risks, benefits and alternatives for the proposed anesthesia with the patient or authorized representative who has indicated his/her understanding and acceptance.   Dental advisory given  Plan Discussed with: CRNA and Anesthesiologist  Anesthesia Plan Comments:         Anesthesia Quick Evaluation

## 2017-03-23 NOTE — Anesthesia Procedure Notes (Signed)
Procedure Name: Intubation Date/Time: 03/23/2017 7:43 AM Performed by: Lind Covert Pre-anesthesia Checklist: Patient identified, Emergency Drugs available, Suction available, Patient being monitored and Timeout performed Patient Re-evaluated:Patient Re-evaluated prior to inductionOxygen Delivery Method: Circle system utilized Preoxygenation: Pre-oxygenation with 100% oxygen Intubation Type: IV induction Ventilation: Mask ventilation without difficulty Laryngoscope Size: Mac and 4 Grade View: Grade II Tube type: Oral Tube size: 9.0 mm Number of attempts: 1 Airway Equipment and Method: Stylet Placement Confirmation: ETT inserted through vocal cords under direct vision,  positive ETCO2 and breath sounds checked- equal and bilateral Secured at: 22 cm Tube secured with: Tape Dental Injury: Teeth and Oropharynx as per pre-operative assessment

## 2017-03-23 NOTE — Transfer of Care (Signed)
Immediate Anesthesia Transfer of Care Note  Patient: Ruben Conner  Procedure(s) Performed: Procedure(s): ENDOBRONCHIAL ULTRASOUND (Bilateral)  Patient Location: PACU  Anesthesia Type:General  Level of Consciousness: sedated  Airway & Oxygen Therapy: Patient Spontanous Breathing and Patient connected to face mask oxygen  Post-op Assessment: Report given to RN and Post -op Vital signs reviewed and stable  Post vital signs: Reviewed and stable  Last Vitals:  Vitals:   03/23/17 0645  BP: (!) 191/77  Pulse: (!) 55  Resp: 19  Temp: 36.4 C    Last Pain:  Vitals:   03/23/17 0742  TempSrc:   PainSc: 0-No pain         Complications: No apparent anesthesia complications

## 2017-03-23 NOTE — Progress Notes (Signed)
Video Bronchoscopy done post EBUS proceure  Intervention Bronchial washing done Intervention Bronchial brushing done

## 2017-03-23 NOTE — Anesthesia Postprocedure Evaluation (Addendum)
Anesthesia Post Note  Patient: Ruben Conner  Procedure(s) Performed: Procedure(s) (LRB): ENDOBRONCHIAL ULTRASOUND (Bilateral)  Patient location during evaluation: Endoscopy Anesthesia Type: General Level of consciousness: awake, awake and alert and oriented Pain management: pain level controlled Vital Signs Assessment: post-procedure vital signs reviewed and stable Respiratory status: spontaneous breathing, respiratory function stable and nonlabored ventilation Cardiovascular status: blood pressure returned to baseline Anesthetic complications: no       Last Vitals:  Vitals:   03/23/17 0940 03/23/17 0950  BP: (!) 145/52   Pulse: (!) 53 (!) 52  Resp: 15 17  Temp:      Last Pain:  Vitals:   03/23/17 0950  TempSrc:   PainSc: 0-No pain                 Estreya Clay COKER

## 2017-03-23 NOTE — H&P (View-Only) (Signed)
Subjective:    Patient ID: Ruben Conner, male    DOB: May 14, 1930, 83 y.o.   MRN: [id number]  HPI  Chief Complaint  Patient presents with  . Pulm Consult    Was recently diagnosed with lung cancer. 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 He is accompanied by his son, Rosalita Chessman today.  He developed chest congestion in February 2018. His initial chest x-ray 2/23 did not show any active lung disease. When symptoms persisted chest x-ray was repeated which showed right hilar prominence. He then underwent CT chest without contrast, since his creatinine was high at 2.4 on 02/26/17. This showed a right hilar soft tissue mass 6.64.8 cm which narrowed the superior vena cava and the right upper lobe bronchus. Scattered mediastinal lymph nodes were noted largest precarinal lymph node measuring 13 mm. A right lower lobe subpleural nodule about 5 mm was noted there was also a calcified granuloma in the right upper lobe.  He has past medical history of hypertension, diabetes and hyperlipidemia and CK D ([facility] )  Patient improved after taking antibiotics and is currently asymptomatic. He denies dyspnea, cough, hemoptysis or wheezing. Denies chest pain, orthopnea or paroxysmal nocturnal dyspnea. He has no fevers or weight loss. He quit smoking about 3 months ago. Prior to this he smoked about a pack per day for more than 40 years. [place] He did score quit smoking for 17 years somewhere in between.    Past Medical History:  Diagnosis Date  . Diabetes mellitus without complication ([place])   . Hyperlipidemia   . Hypertension   . Irregular heart rate   . Renal insufficiency      Past Surgical History:  Procedure Laterality Date  . EYE SURGERY    . MOUTH SURGERY    . ROTATOR CUFF REPAIR    . tumor remoced from neck      No Known Allergies    Social History    Social History  . Marital status: Married    Spouse name: N/A  . Number of children: 1  . Years of education: N/A   Occupational History  . Retired Health and safety inspector    Social History Main Topics  . Smoking status: Current Some Day Smoker    Packs/day: 0.10    Types: Cigarettes  . Smokeless tobacco: Never Used  . Alcohol use No  . Drug use: No  . Sexual activity: Not on file   Other Topics Concern  . Not on file   Social History Narrative  . No narrative on file      Family History  Problem Relation Age of Onset  . Kidney disease Mother   . Heart disease Father     Review of Systems Constitutional: negative for anorexia, fevers and sweats  Eyes: negative for irritation, redness and visual disturbance  Ears, nose, mouth, throat, and face: negative for earaches, epistaxis, nasal congestion and sore throat  Respiratory: negative for cough, dyspnea on exertion, sputum and wheezing  Cardiovascular: negative for chest pain, dyspnea, lower extremity edema, orthopnea, palpitations and syncope  Gastrointestinal: negative for abdominal pain, constipation, diarrhea, melena, nausea and vomiting  Genitourinary:negative for dysuria, frequency and hematuria  Hematologic/lymphatic: negative for bleeding, easy bruising and lymphadenopathy  Musculoskeletal:negative for arthralgias, muscle weakness and stiff joints  Neurological: negative for coordination problems, gait problems, headaches and  weakness  Endocrine: negative for diabetic symptoms including polydipsia, polyuria and weight loss     Objective:   Physical Exam   Gen. Pleasant, well-nourished, in no distress, normal affect ENT - no lesions, no post nasal drip Neck: No JVD, no thyromegaly, no carotid bruits Lungs: no use of accessory muscles, no dullness to percussion, clear without rales or rhonchi  Cardiovascular: Rhythm regular, heart sounds  normal, no murmurs or gallops, no peripheral edema Abdomen: soft and non-tender,  no hepatosplenomegaly, BS normal. Musculoskeletal: No deformities, no cyanosis or clubbing Neuro:  alert, non focal      Assessment & Plan:

## 2017-03-23 NOTE — Discharge Instructions (Signed)
Flexible Bronchoscopy, Care After These instructions give you information on caring for yourself after your procedure. Your doctor may also give you more specific instructions. Call your doctor if you have any problems or questions after your procedure. Follow these instructions at home:  Do not eat or drink anything for 2 hours after your procedure. If you try to eat or drink before the medicine wears off, food or drink could go into your lungs. You could also burn yourself.  After 2 hours have passed and when you can cough and gag normally, you may eat soft food and drink liquids slowly.  The day after the test, you may eat your normal diet.  You may do your normal activities.  Keep all doctor visits. Get help right away if:  You get more and more short of breath.  You get light-headed.  You feel like you are going to pass out (faint).  You have chest pain.  You have new problems that worry you.  You cough up more than a little blood.  You cough up more blood than before. This information is not intended to replace advice given to you by your health care provider. Make sure you discuss any questions you have with your health care provider. Document Released: 08/31/2009 Document Revised: 04/10/2016 Document Reviewed: 07/08/2013 Elsevier Interactive Patient Education  2017 [employer].

## 2017-03-23 NOTE — Interval H&P Note (Signed)
History and Physical Interval Note:  03/23/2017 7:25 AM  Ruben Conner  has presented today for surgery, with the diagnosis of Lung mass  The various methods of treatment have been discussed with the patient and family. After consideration of risks, benefits and other options for treatment, the patient has consented to  Procedure(s): ENDOBRONCHIAL ULTRASOUND (Bilateral) as a surgical intervention .  The patient's history has been reviewed, patient examined, no change in status, stable for surgery.  I have reviewed the patient's chart and labs.  Questions were answered to the patient's satisfaction.     [name] V.

## 2017-03-23 NOTE — Op Note (Signed)
[facility] Cardiopulmonary Patient Name: Ruben Conner Procedure Date: 03/23/2017 MRN: [id number] Attending MD: Leanna Sato. Elsworth Soho MD, MD Date of Birth: 30-Jul-1930 CSN: [id number] Age: 83 Admit Type: Outpatient Ethnicity: Not Hispanic or Latino Procedure:            Bronchoscopy Indications:          Hilar lymphadenopathy of the right side Providers:            Leanna Sato. Elsworth Soho MD, MD, Burtis Junes, RN, Cherylynn Ridges,                        Technician, Gilbert Alday CRNA, CRNA Referring MD:          Medicines:            General Anesthesia Complications:        No immediate complications. Estimated blood loss:                        Minimal Estimated Blood Loss: Estimated blood loss was minimal. Procedure:      Pre-Anesthesia Assessment:      - A History and Physical has been performed. The patient's medications,       allergies and sensitivities have been reviewed.      - The risks and benefits of the procedure and the sedation options and       risks were discussed with the patient. All questions were answered and       informed consent was obtained.      - Pre-procedure physical examination revealed no contraindications to       sedation.      After obtaining informed consent, the bronchoscope was passed under       direct vision. Throughout the procedure, the patient's blood pressure,       pulse, and oxygen saturations were monitored continuously. the [id number]       ([id number]) scope was introduced through the mouth, via the endotracheal       tube and advanced to the tracheobronchial tree. The patient tolerated       the procedure well. Findings:      The endotracheal tube is in good position. The visualized portion of the       trachea is of normal caliber. The carina is sharp. The tracheobronchial       tree was examined to at least the first subsegmental level. Bronchial       mucosa and anatomy are normal; there are no endobronchial lesions, and       no  secretions.      Endobronchial ultrasound was used assist with fine needle aspiration in       the bronchus intermedius and in the right upper lobe.      Transbronchial needle aspirations of a lesion were performed in the       right paratracheal area and in the right hilum and sent for routine       cytology. Stage N2.      Brushings of a lesion were obtained in the right upper lobe with a       cytology brush and sent for routine cytology.      Washings were obtained in the right upper lobe and sent for routine       cytology. The return was bloody. Impression:      - Hilar lymphadenopathy of the right side      -  The examination was normal.      - Endobronchial ultrasound was performed.      - A transbronchial needle aspiration was performed.      - Brushings were obtained.      - Washings were obtained. Moderate Sedation:      general anesthesia Recommendation:      - Await test results. Procedure Code(s):      --- Professional ---      [phone], Bronchoscopy, rigid or flexible, including fluoroscopic guidance,       when performed; with transbronchial needle aspiration biopsy(s),       trachea, main stem and/or lobar bronchus(i)      00938, Bronchoscopy, rigid or flexible, including fluoroscopic guidance,       when performed; with brushing or protected brushings      31654, Bronchoscopy, rigid or flexible, including fluoroscopic guidance,       when performed; with transendoscopic endobronchial ultrasound (EBUS)       during bronchoscopic diagnostic or therapeutic intervention(s) for       peripheral lesion(s) (List separately in addition to code for primary       procedure[s]) Diagnosis Code(s):      --- Professional ---      R59.9, Enlarged lymph nodes, unspecified CPT copyright 2016 American Medical Association. All rights reserved. The codes documented in this report are preliminary and upon coder review may  be revised to meet current compliance requirements. Kara Mead, MD Leanna Sato Elsworth Soho MD, MD 03/23/2017 9:00:57 AM This report has been signed electronically. Number of Addenda: 0 Scope In: Scope Out:

## 2017-03-26 ENCOUNTER — Encounter: Payer: Self-pay | Admitting: *Deleted

## 2017-03-26 ENCOUNTER — Encounter (HOSPITAL_COMMUNITY): Payer: Self-pay | Admitting: Pulmonary Disease

## 2017-03-26 ENCOUNTER — Telehealth: Payer: Self-pay | Admitting: Oncology

## 2017-03-26 ENCOUNTER — Ambulatory Visit (HOSPITAL_BASED_OUTPATIENT_CLINIC_OR_DEPARTMENT_OTHER): Payer: PPO | Admitting: Oncology

## 2017-03-26 VITALS — BP 157/67 | HR 62 | Temp 98.1°F | Resp 18 | Ht 72.0 in | Wt 204.3 lb

## 2017-03-26 DIAGNOSIS — R918 Other nonspecific abnormal finding of lung field: Secondary | ICD-10-CM

## 2017-03-26 DIAGNOSIS — R590 Localized enlarged lymph nodes: Secondary | ICD-10-CM

## 2017-03-26 NOTE — Progress Notes (Signed)
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  Patient had non-diagnostic bronch and needs referral to T surgery for tissue DX.  I notified [facility] and placed referral.   Treatment Phase Abnormal Scans  Barriers/Navigation Needs Coordination of Care  Interventions Coordination of Care  Coordination of Care Appts  Acuity Level 2  Time Spent with Patient 15

## 2017-03-26 NOTE — Telephone Encounter (Signed)
Follow up appointment with Dr Benay Spice was scheduled for 04/03/17 @ 8:30a.m. Per 03/26/17 los. Patient was given a copy of the AVS report and appointment schedule per 03/26/17 los.

## 2017-03-26 NOTE — Progress Notes (Signed)
  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 He was taken to a bronchoscopy and endobronchial ultrasound procedure by Dr. Elsworth Soho on 03/23/2017. Examination of the tracheobronchial tree was unremarkable. Fine-needle aspiration biopsies were performed at the bronchus intermedius and in the right upper lobe. A transbronchial biopsy was performed of a right paratracheal node and at the right hilum. Brushings and washings were obtained from the right upper lobe.  He reports developing a dry cough last night. No other complaint.  The pathology from transbronchial biopsies, brushings, and washings returned nondiagnostic for malignancy.  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 This revealed increased uptake involving a central right upper lobe mass with direct extension to the right side of the mediastinum. There is a hypermetabolic prevascular node. Hypermetabolic middle mediastinal nodes. There is a 6 mm left parotid hypermetabolic nodule.  Objective:  Vital signs in last 24 hours:  Blood pressure (!) 157/67, pulse 62, temperature 98.1 F (36.7 C), temperature source Oral, resp. rate 18, height 6' (1.829 m), weight 204 lb 4.8 oz (92.7 kg), SpO2 97 %.    HEENT: Neck without mass, no left parotid mass Resp: Lungs clear bilaterally Cardio: Regular rate and rhythm   Imaging: PET images from 2020-11-3016-reviewed with Dr. Gilford Rile and his son Medications: I have reviewed the patient's current medications.  Assessment/Plan: 1. Right hilar mass, mediastinal adenopathy ? CT chest 02/26/2017 with a right hilar/suprahilar mass and enlargement of several lymph nodes ? PET scan 2020-11-3016-hypermetabolic medial right upper lobe mass, hypermetabolic mediastinal lymph nodes, hypermetabolic left parotid nodule ? Bronchoscopy 03/23/2017-normal airways, EBUS biopsy of a right paratracheal node in the right upper lung with  bronchial brushings/washings returned nondiagnostic  2. Diabetes 3. Renal insufficiency 4. 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 I reviewed the PET images with him. The PET scan is consistent with a primary central right lung mass with mediastinal adenopathy. I suspect the left parotid lesion is a benign finding. 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  I discussed the case with Dr.Alva. We will make a referral to thoracic surgery for a repeat bronchoscopy/ EBUS and if needed mediastinoscopy.  Dr. Gilford Rile will return for an office visit in one week. He understands the need to obtain a tissue diagnosis prior to treatment planning.  25 minutes were spent with the patient today. The majority of the time was used for counseling and coordination of care.   Betsy Coder, MD  03/26/2017  8:26 AM

## 2017-03-30 ENCOUNTER — Telehealth: Payer: Self-pay | Admitting: *Deleted

## 2017-03-30 ENCOUNTER — Encounter: Payer: Self-pay | Admitting: *Deleted

## 2017-03-30 ENCOUNTER — Telehealth: Payer: Self-pay | Admitting: Oncology

## 2017-03-30 NOTE — Telephone Encounter (Signed)
Confirmed r/s appt to 5/25 at 1230 per sch msg

## 2017-03-30 NOTE — Progress Notes (Signed)
Oncology Nurse Navigator Documentation  Oncology Nurse Navigator Flowsheets 03/30/2017  Navigator Location [facility]  Navigator Encounter Type Other/I updated Dr. Benay Spice on appt for T surgery.  He requested his appt be moved.  I updated scheduling with an urgent scheduling message to change appt with Dr. Benay Spice on 04/10/17.   Treatment Phase Abnormal Scans  Barriers/Navigation Needs Coordination of Care  Interventions Coordination of Care  Coordination of Care Appts  Acuity Level 2  Time Spent with Patient 30

## 2017-03-30 NOTE — Telephone Encounter (Signed)
Oncology Nurse Navigator Documentation  Oncology Nurse Navigator Flowsheets 03/30/2017  Navigator Location [facility]  Navigator Encounter Type Telephone/per Dr. 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  He was thankful for the call and update.   Telephone Outgoing Call  Treatment Phase Abnormal Scans  Barriers/Navigation Needs Education  Education Other  Interventions Education  Coordination of Care Other  Acuity Level 2  Time Spent with Patient 30

## 2017-04-01 ENCOUNTER — Other Ambulatory Visit: Payer: Self-pay | Admitting: Family Medicine

## 2017-04-03 ENCOUNTER — Ambulatory Visit: Payer: PPO | Admitting: Oncology

## 2017-04-07 ENCOUNTER — Institutional Professional Consult (permissible substitution) (INDEPENDENT_AMBULATORY_CARE_PROVIDER_SITE_OTHER): Payer: PPO | Admitting: Cardiothoracic Surgery

## 2017-04-07 ENCOUNTER — Other Ambulatory Visit: Payer: Self-pay | Admitting: *Deleted

## 2017-04-07 ENCOUNTER — Encounter: Payer: Self-pay | Admitting: Cardiothoracic Surgery

## 2017-04-07 VITALS — BP 158/83 | HR 86 | Resp 19 | Ht 72.0 in | Wt 201.8 lb

## 2017-04-07 DIAGNOSIS — R590 Localized enlarged lymph nodes: Secondary | ICD-10-CM

## 2017-04-07 DIAGNOSIS — R918 Other nonspecific abnormal finding of lung field: Secondary | ICD-10-CM | POA: Diagnosis not present

## 2017-04-07 NOTE — Progress Notes (Signed)
[address]       ,[place] [address]             [phone]                    Ruben Conner [facility] Medical Record #[id number] Date of Birth: 08/26/1930  Referring: Ladell Pier, MD Primary Care: Chipper Herb, MD  Chief Complaint:    Chief Complaint  Patient presents with  . 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 The patient had undergone bronchoscopy and endobronchial ultrasound with biopsy of the 10 R and 4R lymph nodes on 03/23/2017 by Dr. Elsworth Soho, no diagnostic material was obtained. Patient has now been referred to thoracic surgery for repeat biopsies.   He is a long-term smoker stopping in December 2017 or January 2018. He noted feeling sick most of the month of February 2018, with cough and congestion no hemoptysis. This led to a chest x-ray in March and a new right hilar mass was noted. Since he is had a CT of the chest PET scan of the chest and attempted biopsy but without any diagnostic material.  He has a history of hypertension diabetes hyperlipidemia and chronic kidney disease, is followed by Dr. Joelyn Oms  Current Activity/ Functional Status:  Patient is independent with mobility/ambulation, transfers, ADL's, IADL's.   Zubrod Score: At the time of surgery this patient's most appropriate activity status/level should be described as: '[]'$     0    Normal activity, no symptoms '[x]'$     1    Restricted in physical strenuous activity but ambulatory, able to do out light work '[]'$     2    Ambulatory and capable of self care, unable to do work activities, up and about               >50 % of waking hours                              '[]'$     3    Only limited self care, in bed greater than 50% of waking hours '[]'$     4    Completely disabled, no self  care, confined to bed or chair '[]'$     5    Moribund   Past Medical History:  Diagnosis Date  . Diabetes mellitus without complication ([place])   . Hyperlipidemia   . Hypertension   . Irregular heart rate   . Renal insufficiency     Past Surgical History:  Procedure Laterality Date  . ENDOBRONCHIAL ULTRASOUND Bilateral 03/23/2017   Procedure: ENDOBRONCHIAL ULTRASOUND;  Surgeon: Rigoberto Noel, MD;  Location: [facility];  Service: Cardiopulmonary;  Laterality: Bilateral;  . EYE SURGERY    . MOUTH SURGERY    . ROTATOR CUFF REPAIR    . tumor remoced from neck      Family History  Problem Relation Age of Onset  . Kidney disease Mother   . Heart disease Father     Social History   Social History  . Marital status: Married    Spouse name: N/A  . Number of children: 1  . Years  of education: N/A   Occupational History  . Retired Health and safety inspector    Social History Main Topics  . Smoking status: Former Smoker    Packs/day: 0.10    Types: Cigarettes    Quit date: 11/28/2016  . Smokeless tobacco: Never Used  . Alcohol use No  . Drug use: No  . Sexual activity: Not on file   Other Topics Concern  . Not on file   Social History Narrative  . No narrative on file    History  Smoking Status  . Former Smoker  . Packs/day: 0.10  . Types: Cigarettes  . Quit date: 11/28/2016  Smokeless Tobacco  . Never Used    History  Alcohol Use No     No Known Allergies  Current Outpatient Prescriptions  Medication Sig Dispense Refill  . amLODipine (NORVASC) 5 MG tablet TAKE (1) TABLET TWICE A DAY. 60 tablet 4  . benazepril (LOTENSIN) 20 MG tablet TAKE (1) TABLET TWICE A DAY. 60 tablet 4  . furosemide (LASIX) 40 MG tablet Take 1 tablet by mouth 2 (two) times daily.     Marland Kitchen glimepiride (AMARYL) 4 MG tablet TAKE 1 TABLET DAILY 30 tablet 11  . hydrochlorothiazide (HYDRODIURIL) 25 MG tablet Take 25 mg by mouth every morning.     . mupirocin ointment (BACTROBAN) 2 % Apply 1 application  topically 2 (two) times daily. (Patient taking differently: Apply 1 application topically 2 (two) times daily as needed (wounds). ) 22 g 1  . niacin (NIASPAN) 500 MG CR tablet Take 500-1,000 mg by mouth 2 (two) times daily. 500 in AM and 1000 in evening    . pioglitazone (ACTOS) 30 MG tablet TAKE 1 TABLET DAILY 30 tablet 11  . potassium chloride SA (K-DUR,KLOR-CON) 20 MEQ tablet TAKE 1 TABLET DAILY 30 tablet 4  . travoprost, benzalkonium, (TRAVATAN) 0.004 % ophthalmic solution Place 1 drop into both eyes at bedtime.     No current facility-administered medications for this visit.       Review of Systems:     Cardiac Review of Systems: Y or N  Chest Pain [ n   ]  Resting SOB [  n ] Exertional SOB  [ y ]  Orthopnea [ n ]   Pedal Edema [ y  ]    Palpitations [  n] Syncope  [n ]   Presyncope [ n  ]  General Review of Systems: [Y] = yes [  ]=no Constitional: recent weight change [  ];  Wt loss over the last 3 months [   ] anorexia [  ]; fatigue [  ]; nausea [  ]; night sweats [  ]; fever [  ]; or chills [  ];          Dental: poor dentition[  ]; Last Dentist visit:   Eye : blurred vision [  ]; diplopia [   ]; vision changes [  ];  Amaurosis fugax[  ]; Resp: cough [name]  ];  wheezing[y  ];  hemoptysis[n  ]; shortness of breath[ y ]; paroxysmal nocturnal dyspnea[  ]; dyspnea on exertion[  ]; or orthopnea[  ];  GI:  gallstones[  ], vomiting[  ];  dysphagia[  ]; melena[  ];  hematochezia [  ]; heartburn[  ];   Hx of  Colonoscopy[  ]; GU: kidney stones [  ]; hematuria[  ];   dysuria [  ];  nocturia[  ];  history of     obstruction [  ];  urinary frequency [  ]             Skin: rash, swelling[  ];, hair loss[  ];  peripheral edema[  ];  or itching[  ]; Musculosketetal: myalgias[  ];  joint swelling[  ];  joint erythema[  ];  joint pain[  ];  back pain[  ];  Heme/Lymph: bruising[  ];  bleeding[  ];  anemia[  ];  Neuro: TIA[  ];  headaches[  ];  stroke[  ];  vertigo[  ];  seizures[  ];   paresthesias[  ];   difficulty walking[  ];  Psych:depression[  ]; anxiety[  ];  Endocrine: diabetes[  ];  thyroid dysfunction[  ];  Immunizations: Flu up to date [  ]; Pneumococcal up to date [  ];  Other:  Physical Exam: BP (!) 158/83   Pulse 86   Resp 19   Ht 6' (1.829 m)   Wt 201 lb 12.8 oz (91.5 kg)   SpO2 96% Comment: on RA  BMI 27.37 kg/m   PHYSICAL EXAMINATION: General appearance: alert and cooperative Head: Normocephalic, without obvious abnormality, atraumatic Neck: no adenopathy, no carotid bruit, no JVD, supple, symmetrical, trachea midline and thyroid not enlarged, symmetric, no tenderness/mass/nodules Lymph nodes: Cervical, supraclavicular, and axillary nodes normal. and On exam I cannot palpate any parotid mass on either side Resp: diminished breath sounds bibasilar Back: symmetric, no curvature. ROM normal. No CVA tenderness. Cardio: regular rate and rhythm, S1, S2 normal, no murmur, click, rub or gallop GI: soft, non-tender; bowel sounds normal; no masses,  no organomegaly Extremities: extremities normal, atraumatic, no cyanosis or edema and Homans sign is negative, no sign of DVT Neurologic: Grossly normal  Diagnostic Studies & Laboratory data:     Recent Radiology Findings:  Nm Pet Image Initial (pi) Skull Base To Thigh  Result Date: 03/20/2017 CLINICAL DATA:  Initial treatment strategy for right-sided lung mass. EXAM: NUCLEAR MEDICINE PET SKULL BASE TO THIGH TECHNIQUE: 10.0 mCi F-18 FDG was injected intravenously. Full-ring PET imaging was performed from the skull base to thigh after the radiotracer. CT data was obtained and used for attenuation correction and anatomic localization. FASTING BLOOD GLUCOSE:  Value: 107 mg/dl COMPARISON:  Chest CT 02/26/2017 FINDINGS: NECK Deep left parotid hypermetabolic nodule measures 6 mm and a S.U.V. max of 7.2 on image 19/series 4. No cervical nodal hypermetabolism.  No cervical adenopathy. CHEST Right suprahilar/ upper lobe lung mass with direct  extension to or contiguous adenopathy within the right hilum. Involvement of the right-sided mediastinum. This measures 5.6 x 5.0 cm and a S.U.V. max of 8.2 on image 69/series 4. Prevascular node measures 10 mm and a S.U.V. max of 3.9 on image 65/series 4. Hypermetabolic middle mediastinal nodes, including a precarinal node which measures 1.3 cm and a S.U.V. max of 6.2 on image 73/series 4. Mild cardiomegaly with coronary artery atherosclerosis. Centrilobular emphysema. Other chest findings deferred to prior diagnostic CT. ABDOMEN/PELVIS No abdominopelvic nodal or parenchymal hypermetabolism. Mild renal cortical thinning bilaterally. Normal adrenal glands. Multiple small gallstones, without acute cholecystitis. Aortic and branch vessel atherosclerosis. Moderate prostatomegaly. Left larger than right fat containing inguinal hernias. Large left and moderate right-sided hydrocele. SKELETON No abnormal marrow activity. No focal osseous lesion. Degenerative partial fusion of the bilateral sacroiliac joints. Lumbar facet arthropathy. IMPRESSION: 1. Primary bronchogenic neoplasm with epicenter in the central right upper lobe. Direct extension to versus contiguous adenopathy within the right hilum. Direct extension into the right side of the mediastinum. 2.  Thoracic nodal metastasis. 3. Hypermetabolic left parotid nodule is suspicious for a benign or malignant primary neoplasm. 4.  Coronary artery atherosclerosis. Aortic atherosclerosis. 5. Cholelithiasis. Electronically Signed   By: Abigail Miyamoto M.D.   On: 05/17/202018 09:33   CLINICAL DATA:  Follow-up abnormal chest x-ray  EXAM: CT CHEST WITHOUT CONTRAST  TECHNIQUE: Multidetector CT imaging of the chest was performed following the standard protocol without IV contrast. Contrast could not be administered due to elevated creatinine  COMPARISON:  02/11/2017  FINDINGS: Cardiovascular: Somewhat limited due to lack of IV contrast. Aortic calcifications are  seen without aneurysmal dilatation. Coronary calcifications are seen. No significant cardiac enlargement is noted.  Mediastinum/Nodes: The thoracic inlet is within normal limits. Scattered mediastinal lymph nodes are identified. The largest of these lies in the precarinal region measuring 13.5 mm in short axis. Emanating from the right hilum superiorly is a soft tissue mass which measures approximately 6.6 by 4.8 cm in greatest AP and transverse dimensions. It abuts the superior vena cava distally and attenuates the right upper lobe bronchial tree somewhat.  Lungs/Pleura: Left lung is well aerated with mild atelectatic changes. Similar dependent changes are noted in the right lung. The previously described right hilar and suprahilar mass lesion is identified. A small right lower lobe subpleural nodule is noted which measures 5 mm in greatest dimension. This is best seen on image number 98 of series 4. Partially calcified nodule is noted in the posterior right upper lobe best seen on image number 51. This is adjacent to the suprahilar mass lesion and made the a small extension with mild calcification. This may represent a small partially calcified granuloma.  Upper Abdomen: The adrenal glands are within normal limits. The remainder of the upper abdomen is unremarkable.  Musculoskeletal: Degenerative changes of the thoracic spine are seen. No destructive bony lesions are seen.  IMPRESSION: Changes consistent with right hilar and suprahilar mass lesion consistent with primary pulmonary neoplasm. Associated mediastinal adenopathy is noted Direct visualization and biopsy with bronchoscopy is recommended. PET-CT would be helpful in staging as well.  These results will be called to the ordering clinician or representative by the Radiologist Assistant, and communication documented in the PACS or zVision Dashboard.   Electronically Signed   By: Inez Catalina M.D.   On:  02/27/2017 08:20  I have independently reviewed the above radiology studies  and reviewed the findings with the patient.   Recent Lab Findings: Lab Results  Component Value Date   WBC 9.0 02/04/2017   HGB 10.9 (L) 03/23/2017   HCT 32.0 (L) 03/23/2017   PLT 263 02/04/2017   GLUCOSE 91 03/23/2017   CHOL 159 02/04/2017   TRIG 86 02/04/2017   HDL 39 (L) 02/04/2017   LDLCALC 103 (H) 02/04/2017   ALT 13 02/04/2017   AST 13 02/04/2017   NA 141 03/23/2017   K 3.7 03/23/2017   CL 103 02/04/2017   CREATININE 2.40 (H) 02/26/2017   BUN 37 (H) 02/04/2017   CO2 23 02/04/2017   HGBA1C 5.6 01/10/2016   Chronic Kidney Disease   Stage I     GFR >90  Stage II    GFR 60-89  Stage IIIA GFR 45-59  Stage IIIB GFR 30-44  Stage IV   GFR 15-29  Stage V    GFR  <15  Lab Results  Component Value Date   CREATININE 2.40 (H) 02/26/2017   CrCl cannot be calculated (Patient's most recent lab result is older than the maximum 21  days allowed.). Est gfr 28 -30     Assessment / Plan:   Right upper lobe mass consistent with malignancy with  Invasion of mediastinum and PET positive #7 lymph node, likely clinical stage  IIIB, T4, N2, MO stage IV chronic kidney disease, with elevated creatinine to 2.4  Diabetes with complications of renal insufficiency Hypertension Hyperlipidemia   I  spent 40 minutes counseling the patient face to face and 50% or more the  time was spent in counseling and coordination of care. The total time spent in the appointment was 60 minutes.  Grace Isaac MD      [address] [place] [address] Office [phone]   Beeper [phone]  04/07/2017 5:16 PM

## 2017-04-08 ENCOUNTER — Ambulatory Visit (HOSPITAL_COMMUNITY)
Admission: RE | Admit: 2017-04-08 | Discharge: 2017-04-08 | Disposition: A | Discharge status: Home / Self Care | Payer: PPO | Source: Ambulatory Visit | Attending: Cardiothoracic Surgery | Admitting: Cardiothoracic Surgery

## 2017-04-08 ENCOUNTER — Encounter (HOSPITAL_COMMUNITY): Payer: Self-pay | Admitting: Certified Registered Nurse Anesthetist

## 2017-04-08 ENCOUNTER — Ambulatory Visit (HOSPITAL_COMMUNITY): Payer: PPO

## 2017-04-08 ENCOUNTER — Ambulatory Visit (HOSPITAL_COMMUNITY): Payer: PPO | Admitting: Certified Registered Nurse Anesthetist

## 2017-04-08 ENCOUNTER — Encounter (HOSPITAL_COMMUNITY)
Admission: RE | Disposition: A | Discharge status: Home / Self Care | Payer: Self-pay | Source: Ambulatory Visit | Attending: Cardiothoracic Surgery

## 2017-04-08 DIAGNOSIS — Z841 Family history of disorders of kidney and ureter: Secondary | ICD-10-CM | POA: Diagnosis not present

## 2017-04-08 DIAGNOSIS — Z9889 Other specified postprocedural states: Secondary | ICD-10-CM | POA: Diagnosis not present

## 2017-04-08 DIAGNOSIS — Z8249 Family history of ischemic heart disease and other diseases of the circulatory system: Secondary | ICD-10-CM | POA: Insufficient documentation

## 2017-04-08 DIAGNOSIS — R222 Localized swelling, mass and lump, trunk: Secondary | ICD-10-CM | POA: Diagnosis not present

## 2017-04-08 DIAGNOSIS — Z87891 Personal history of nicotine dependence: Secondary | ICD-10-CM | POA: Insufficient documentation

## 2017-04-08 DIAGNOSIS — R590 Localized enlarged lymph nodes: Secondary | ICD-10-CM | POA: Diagnosis not present

## 2017-04-08 DIAGNOSIS — I129 Hypertensive chronic kidney disease with stage 1 through stage 4 chronic kidney disease, or unspecified chronic kidney disease: Secondary | ICD-10-CM | POA: Diagnosis not present

## 2017-04-08 DIAGNOSIS — I10 Essential (primary) hypertension: Secondary | ICD-10-CM | POA: Diagnosis not present

## 2017-04-08 DIAGNOSIS — I440 Atrioventricular block, first degree: Secondary | ICD-10-CM | POA: Diagnosis not present

## 2017-04-08 DIAGNOSIS — N400 Enlarged prostate without lower urinary tract symptoms: Secondary | ICD-10-CM | POA: Diagnosis not present

## 2017-04-08 DIAGNOSIS — E785 Hyperlipidemia, unspecified: Secondary | ICD-10-CM | POA: Insufficient documentation

## 2017-04-08 DIAGNOSIS — I700 Atherosclerosis of aorta: Secondary | ICD-10-CM | POA: Insufficient documentation

## 2017-04-08 DIAGNOSIS — N184 Chronic kidney disease, stage 4 (severe): Secondary | ICD-10-CM | POA: Diagnosis not present

## 2017-04-08 DIAGNOSIS — E1169 Type 2 diabetes mellitus with other specified complication: Secondary | ICD-10-CM | POA: Diagnosis not present

## 2017-04-08 DIAGNOSIS — K8020 Calculus of gallbladder without cholecystitis without obstruction: Secondary | ICD-10-CM | POA: Diagnosis not present

## 2017-04-08 DIAGNOSIS — Z01818 Encounter for other preprocedural examination: Secondary | ICD-10-CM

## 2017-04-08 DIAGNOSIS — Z9689 Presence of other specified functional implants: Secondary | ICD-10-CM

## 2017-04-08 DIAGNOSIS — E1122 Type 2 diabetes mellitus with diabetic chronic kidney disease: Secondary | ICD-10-CM | POA: Diagnosis not present

## 2017-04-08 DIAGNOSIS — R918 Other nonspecific abnormal finding of lung field: Secondary | ICD-10-CM

## 2017-04-08 DIAGNOSIS — E119 Type 2 diabetes mellitus without complications: Secondary | ICD-10-CM | POA: Diagnosis not present

## 2017-04-08 DIAGNOSIS — K4090 Unilateral inguinal hernia, without obstruction or gangrene, not specified as recurrent: Secondary | ICD-10-CM | POA: Diagnosis not present

## 2017-04-08 DIAGNOSIS — N433 Hydrocele, unspecified: Secondary | ICD-10-CM | POA: Diagnosis not present

## 2017-04-08 HISTORY — PX: MEDIASTINOSCOPY: SHX5086

## 2017-04-08 HISTORY — PX: VIDEO BRONCHOSCOPY WITH ENDOBRONCHIAL ULTRASOUND: SHX6177

## 2017-04-08 LAB — COMPREHENSIVE METABOLIC PANEL
ALT: 35 U/L (ref 17–63)
AST: 19 U/L (ref 15–41)
Albumin: 3.7 g/dL (ref 3.5–5.0)
Alkaline Phosphatase: 87 U/L (ref 38–126)
Anion gap: 8 (ref 5–15)
BUN: 34 mg/dL — ABNORMAL HIGH (ref 6–20)
CO2: 22 mmol/L (ref 22–32)
Calcium: 9 mg/dL (ref 8.9–10.3)
Chloride: 109 mmol/L (ref 101–111)
Creatinine, Ser: 2.37 mg/dL — ABNORMAL HIGH (ref 0.61–1.24)
GFR calc Af Amer: 27 mL/min — ABNORMAL LOW (ref >60)
GFR calc non Af Amer: 24 mL/min — ABNORMAL LOW (ref >60)
Glucose, Bld: 107 mg/dL — ABNORMAL HIGH (ref 65–99)
Potassium: 3.8 mmol/L (ref 3.5–5.1)
Sodium: 139 mmol/L (ref 135–145)
Total Bilirubin: 0.8 mg/dL (ref 0.3–1.2)
Total Protein: 6.8 g/dL (ref 6.5–8.1)

## 2017-04-08 LAB — CBC
HCT: 35.3 % — ABNORMAL LOW (ref 39.0–52.0)
Hemoglobin: 11.7 g/dL — ABNORMAL LOW (ref 13.0–17.0)
MCH: 28.8 pg (ref 26.0–34.0)
MCHC: 33.1 g/dL (ref 30.0–36.0)
MCV: 86.9 fL (ref 78.0–100.0)
Platelets: 212 10*3/uL (ref 150–400)
RBC: 4.06 MIL/uL — ABNORMAL LOW (ref 4.22–5.81)
RDW: 13.3 % (ref 11.5–15.5)
WBC: 7.7 10*3/uL (ref 4.0–10.5)

## 2017-04-08 LAB — SURGICAL PCR SCREEN
MRSA, PCR: NEGATIVE
Staphylococcus aureus: NEGATIVE

## 2017-04-08 LAB — GLUCOSE, CAPILLARY
Glucose-Capillary: 114 mg/dL — ABNORMAL HIGH (ref 65–99)
Glucose-Capillary: 83 mg/dL (ref 65–99)
Glucose-Capillary: 70 mg/dL (ref 65–99)
Glucose-Capillary: 104 mg/dL — ABNORMAL HIGH (ref 65–99)
Glucose-Capillary: 110 mg/dL — ABNORMAL HIGH (ref 65–99)
Glucose-Capillary: 121 mg/dL — ABNORMAL HIGH (ref 65–99)

## 2017-04-08 LAB — PROTIME-INR
INR: 0.99
Prothrombin Time: 13.1 seconds (ref 11.4–15.2)

## 2017-04-08 LAB — APTT: aPTT: 26 seconds (ref 24–36)

## 2017-04-08 SURGERY — BRONCHOSCOPY, WITH EBUS
Anesthesia: General

## 2017-04-08 MED ORDER — MUPIROCIN 2 % EX OINT
1.0000 | TOPICAL_OINTMENT | Freq: Once | CUTANEOUS | Status: AC
Start: 2017-04-08 — End: 2017-04-08
  Administered 2017-04-08: 1 via TOPICAL

## 2017-04-08 MED ORDER — LIDOCAINE HCL (CARDIAC) 20 MG/ML IV SOLN
INTRAVENOUS | Status: DC | PRN
  Administered 2017-04-08: 60 mg via INTRAVENOUS

## 2017-04-08 MED ORDER — FENTANYL CITRATE (PF) 100 MCG/2ML IJ SOLN
INTRAMUSCULAR | Status: DC
Start: 2017-04-08 — End: 2017-04-08
  Filled 2017-04-08: qty 2

## 2017-04-08 MED ORDER — ROCURONIUM BROMIDE 10 MG/ML (PF) SYRINGE
PREFILLED_SYRINGE | INTRAVENOUS | Status: AC
  Filled 2017-04-08: qty 5

## 2017-04-08 MED ORDER — DEXTROSE 50 % IV SOLN
INTRAVENOUS | Status: AC
  Administered 2017-04-08: 12.5 g via INTRAVENOUS
  Filled 2017-04-08: qty 50

## 2017-04-08 MED ORDER — HEMOSTATIC AGENTS (NO CHARGE) OPTIME
TOPICAL | Status: DC | PRN
  Administered 2017-04-08: 1 via TOPICAL

## 2017-04-08 MED ORDER — DEXTROSE 50 % IV SOLN
12.5000 g | Freq: Once | INTRAVENOUS | Status: AC
  Administered 2017-04-08: 12.5 g via INTRAVENOUS

## 2017-04-08 MED ORDER — FENTANYL CITRATE (PF) 100 MCG/2ML IJ SOLN
25.0000 ug | INTRAMUSCULAR | Status: DC | PRN
  Administered 2017-04-08: 25 ug via INTRAVENOUS

## 2017-04-08 MED ORDER — ONDANSETRON HCL 4 MG/2ML IJ SOLN
INTRAMUSCULAR | Status: DC | PRN
  Administered 2017-04-08: 4 mg via INTRAVENOUS

## 2017-04-08 MED ORDER — PROPOFOL 10 MG/ML IV BOLUS
INTRAVENOUS | Status: AC
  Filled 2017-04-08: qty 40

## 2017-04-08 MED ORDER — DEXAMETHASONE SODIUM PHOSPHATE 10 MG/ML IJ SOLN
INTRAMUSCULAR | Status: DC | PRN
  Administered 2017-04-08: 10 mg via INTRAVENOUS

## 2017-04-08 MED ORDER — CEFUROXIME SODIUM 1.5 G IV SOLR
1.5000 g | INTRAVENOUS | Status: AC
  Administered 2017-04-08: 1.5 g via INTRAVENOUS
  Filled 2017-04-08: qty 1.5

## 2017-04-08 MED ORDER — EPHEDRINE SULFATE 50 MG/ML IJ SOLN
INTRAMUSCULAR | Status: DC | PRN
  Administered 2017-04-08 (×2): 10 mg via INTRAVENOUS

## 2017-04-08 MED ORDER — 0.9 % SODIUM CHLORIDE (POUR BTL) OPTIME
TOPICAL | Status: DC | PRN
  Administered 2017-04-08: 1000 mL

## 2017-04-08 MED ORDER — FENTANYL CITRATE (PF) 250 MCG/5ML IJ SOLN
INTRAMUSCULAR | Status: AC
  Filled 2017-04-08: qty 5

## 2017-04-08 MED ORDER — ONDANSETRON HCL 4 MG/2ML IJ SOLN: 4.0000 mg | Freq: Once | INTRAMUSCULAR | Status: DC | PRN

## 2017-04-08 MED ORDER — ROCURONIUM BROMIDE 100 MG/10ML IV SOLN
INTRAVENOUS | Status: DC | PRN
  Administered 2017-04-08 (×2): 10 mg via INTRAVENOUS
  Administered 2017-04-08: 50 mg via INTRAVENOUS

## 2017-04-08 MED ORDER — EPHEDRINE 5 MG/ML INJ
INTRAVENOUS | Status: AC
  Filled 2017-04-08: qty 10

## 2017-04-08 MED ORDER — DEXAMETHASONE SODIUM PHOSPHATE 10 MG/ML IJ SOLN
INTRAMUSCULAR | Status: AC
  Filled 2017-04-08: qty 1

## 2017-04-08 MED ORDER — EPINEPHRINE PF 1 MG/ML IJ SOLN
INTRAMUSCULAR | Status: AC
  Filled 2017-04-08: qty 1

## 2017-04-08 MED ORDER — TRAMADOL HCL 50 MG PO TABS: 50.0000 mg | ORAL_TABLET | Freq: Four times a day (QID) | ORAL | 0 refills | Status: AC | PRN

## 2017-04-08 MED ORDER — FENTANYL CITRATE (PF) 100 MCG/2ML IJ SOLN
INTRAMUSCULAR | Status: DC | PRN
  Administered 2017-04-08 (×6): 50 ug via INTRAVENOUS

## 2017-04-08 MED ORDER — PROPOFOL 10 MG/ML IV BOLUS
INTRAVENOUS | Status: DC | PRN
  Administered 2017-04-08: 200 mg via INTRAVENOUS

## 2017-04-08 MED ORDER — LIDOCAINE 2% (20 MG/ML) 5 ML SYRINGE
INTRAMUSCULAR | Status: AC
  Filled 2017-04-08: qty 5

## 2017-04-08 MED ORDER — EPINEPHRINE PF 1 MG/ML IJ SOLN
INTRAMUSCULAR | Status: DC | PRN
  Administered 2017-04-08: 1 mg

## 2017-04-08 MED ORDER — SODIUM CHLORIDE 0.9 % IV SOLN
INTRAVENOUS | Status: DC
  Administered 2017-04-08 (×2): via INTRAVENOUS

## 2017-04-08 MED ORDER — PHENYLEPHRINE HCL 10 MG/ML IJ SOLN
INTRAVENOUS | Status: DC | PRN
  Administered 2017-04-08: 25 ug/min via INTRAVENOUS

## 2017-04-08 MED ORDER — MUPIROCIN 2 % EX OINT
TOPICAL_OINTMENT | CUTANEOUS | Status: AC
  Administered 2017-04-08: 1 via TOPICAL
  Filled 2017-04-08: qty 22

## 2017-04-08 MED ORDER — SUGAMMADEX SODIUM 200 MG/2ML IV SOLN
INTRAVENOUS | Status: AC
  Filled 2017-04-08: qty 2

## 2017-04-08 MED ORDER — SUGAMMADEX SODIUM 200 MG/2ML IV SOLN
INTRAVENOUS | Status: DC | PRN
  Administered 2017-04-08: 200 mg via INTRAVENOUS

## 2017-04-08 SURGICAL SUPPLY — 60 items
ADH SKN CLS APL DERMABOND .7 (GAUZE/BANDAGES/DRESSINGS) ×1
BLADE SURG 10 STRL SS (BLADE) ×2 IMPLANT
BRUSH CYTOL CELLEBRITY 1.5X140 (MISCELLANEOUS) IMPLANT
CANISTER SUCT 3000ML PPV (MISCELLANEOUS) ×4 IMPLANT
CLIP TI MEDIUM 6 (CLIP) IMPLANT
CONT SPEC 4OZ CLIKSEAL STRL BL (MISCELLANEOUS) ×10 IMPLANT
COVER BACK TABLE 60X90IN (DRAPES) ×2 IMPLANT
COVER DOME SNAP 22 D (MISCELLANEOUS) ×1 IMPLANT
COVER SURGICAL LIGHT HANDLE (MISCELLANEOUS) ×4 IMPLANT
DERMABOND ADVANCED (GAUZE/BANDAGES/DRESSINGS) ×1
DERMABOND ADVANCED .7 DNX12 (GAUZE/BANDAGES/DRESSINGS) ×1 IMPLANT
DRAPE LAPAROTOMY T 102X78X121 (DRAPES) ×2 IMPLANT
DRSG AQUACEL AG ADV 3.5X14 (GAUZE/BANDAGES/DRESSINGS) ×1 IMPLANT
ELECT CAUTERY BLADE 6.4 (BLADE) ×2 IMPLANT
ELECT REM PT RETURN 9FT ADLT (ELECTROSURGICAL) ×2
ELECTRODE REM PT RTRN 9FT ADLT (ELECTROSURGICAL) ×1 IMPLANT
FILTER STRAW FLUID ASPIR (MISCELLANEOUS) ×1 IMPLANT
FORCEPS BIOP RJ4 1.8 (CUTTING FORCEPS) ×1 IMPLANT
FORCEPS BIOP SPYBITE 1.2X286 (FORCEP) ×1 IMPLANT
GAUZE SPONGE 4X4 12PLY STRL (GAUZE/BANDAGES/DRESSINGS) ×3 IMPLANT
GAUZE SPONGE 4X4 16PLY XRAY LF (GAUZE/BANDAGES/DRESSINGS) ×2 IMPLANT
GLOVE BIO SURGEON STRL SZ 6.5 (GLOVE) ×5 IMPLANT
GOWN STRL REUS W/ TWL LRG LVL3 (GOWN DISPOSABLE) ×1 IMPLANT
GOWN STRL REUS W/TWL LRG LVL3 (GOWN DISPOSABLE) ×8
HEMOSTAT SURGICEL 2X14 (HEMOSTASIS) ×1 IMPLANT
KIT BASIN OR (CUSTOM PROCEDURE TRAY) ×3 IMPLANT
KIT CLEAN ENDO COMPLIANCE (KITS) ×5 IMPLANT
KIT ROOM TURNOVER OR (KITS) ×3 IMPLANT
MARKER SKIN DUAL TIP RULER LAB (MISCELLANEOUS) ×2 IMPLANT
NDL BLUNT 18X1 FOR OR ONLY (NEEDLE) IMPLANT
NDL EBUS SONO TIP PENTAX (NEEDLE) ×1 IMPLANT
NEEDLE BLUNT 18X1 FOR OR ONLY (NEEDLE) IMPLANT
NEEDLE EBUS SONO TIP PENTAX (NEEDLE) ×4 IMPLANT
NS IRRIG 1000ML POUR BTL (IV SOLUTION) ×3 IMPLANT
OIL SILICONE PENTAX (PARTS (SERVICE/REPAIRS)) ×2 IMPLANT
PACK SURGICAL SETUP 50X90 (CUSTOM PROCEDURE TRAY) ×2 IMPLANT
PAD ARMBOARD 7.5X6 YLW CONV (MISCELLANEOUS) ×8 IMPLANT
PENCIL BUTTON HOLSTER BLD 10FT (ELECTRODE) ×2 IMPLANT
SOLUTION ANTI FOG 6CC (MISCELLANEOUS) ×1 IMPLANT
SPONGE INTESTINAL PEANUT (DISPOSABLE) IMPLANT
STAPLER VISISTAT 35W (STAPLE) IMPLANT
SUT VIC AB 3-0 SH 18 (SUTURE) ×2 IMPLANT
SUT VIC AB 4-0 PS2 27 (SUTURE) ×1 IMPLANT
SUT VICRYL 4-0 PS2 18IN ABS (SUTURE) ×3 IMPLANT
SWAB COLLECTION DEVICE MRSA (MISCELLANEOUS) IMPLANT
SWAB CULTURE ESWAB REG 1ML (MISCELLANEOUS) IMPLANT
SYR 10ML LL (SYRINGE) ×2 IMPLANT
SYR 20CC LL (SYRINGE) ×2 IMPLANT
SYR 20ML ECCENTRIC (SYRINGE) ×2 IMPLANT
SYR 5ML LUER SLIP (SYRINGE) ×1 IMPLANT
SYR TB 1ML LUER SLIP (SYRINGE) ×1 IMPLANT
SYRINGE 20CC LL (MISCELLANEOUS) ×1 IMPLANT
TOWEL GREEN STERILE (TOWEL DISPOSABLE) ×3 IMPLANT
TOWEL GREEN STERILE FF (TOWEL DISPOSABLE) ×3 IMPLANT
TOWEL OR 17X24 6PK STRL BLUE (TOWEL DISPOSABLE) ×4 IMPLANT
TOWEL OR 17X26 10 PK STRL BLUE (TOWEL DISPOSABLE) ×2 IMPLANT
TRAP SPECIMEN MUCOUS 40CC (MISCELLANEOUS) ×2 IMPLANT
TUBE CONNECTING 12X1/4 (SUCTIONS) ×2 IMPLANT
TUBE CONNECTING 20X1/4 (TUBING) ×2 IMPLANT
WATER STERILE IRR 1000ML POUR (IV SOLUTION) ×3 IMPLANT

## 2017-04-08 NOTE — Anesthesia Postprocedure Evaluation (Signed)
Anesthesia Post Note  Patient: Ruben Conner  Procedure(s) Performed: Procedure(s) (LRB): VIDEO BRONCHOSCOPY WITH ENDOBRONCHIAL ULTRASOUND (N/A) MEDIASTINOSCOPY (N/A)  Patient location during evaluation: PACU Anesthesia Type: General Level of consciousness: awake and alert Pain management: pain level controlled Vital Signs Assessment: post-procedure vital signs reviewed and stable Respiratory status: spontaneous breathing, nonlabored ventilation, respiratory function stable and patient connected to nasal cannula oxygen Cardiovascular status: blood pressure returned to baseline and stable Postop Assessment: no signs of nausea or vomiting Anesthetic complications: no       Last Vitals:  Vitals:   04/08/17 1639 04/08/17 1654  BP: 125/76 124/69  Pulse: 90 87  Resp: 18 16  Temp:      Last Pain:  Vitals:   04/08/17 0852  TempSrc: Oral                 Ryan P Ellender

## 2017-04-08 NOTE — Discharge Instructions (Signed)
Flexible Bronchoscopy, Care After °This sheet gives you information about how to care for yourself after your procedure. Your health care provider may also give you more specific instructions. If you have problems or questions, contact your health care provider. °What can I expect after the procedure? °After the procedure, it is common to have the following symptoms for 24-48 hours: °· A cough that is worse than it was before the procedure. °· A low-grade fever. °· A sore throat or hoarse voice. °· Small streaks of blood in the mucus from your lungs (sputum), if tissue samples were removed (biopsy). ° °Follow these instructions at home: °Eating and drinking °· Do not eat or drink anything (including water) for 2 hours after your procedure, or until your numbing medicine (local anesthetic) has worn off. Having a numb throat increases your risk of burning yourself or choking. °· After your numbness is gone and your cough and gag reflexes have returned, you may start eating only soft foods and slowly drinking liquids. °· The day after the procedure, return to your normal diet. °Driving °· Do not drive for 24 hours if you were given a medicine to help you relax (sedative). °· Do not drive or use heavy machinery while taking prescription pain medicine. °General instructions °· Take over-the-counter and prescription medicines only as told by your health care provider. °· Return to your normal activities as told by your health care provider. Ask your health care provider what activities are safe for you. °· Do not use any products that contain nicotine or tobacco, such as cigarettes and e-cigarettes. If you need help quitting, ask your health care provider. °· Keep all follow-up visits as told by your health care provider. This is important, especially if you had a biopsy taken. °Get help right away if: °· You have shortness of breath that gets worse. °· You become light-headed or feel like you might faint. °· You have  chest pain. °· You cough up more than a small amount of blood. °· The amount of blood you cough up increases. °Summary °· Common symptoms in the 24-48 hours following a flexible bronchoscopy include cough, low-grade fever, sore throat or hoarse voice, and blood-streaked mucus from the lungs (if you had a biopsy). °· Do not eat or drink anything (including water) for 2 hours after your procedure, or until your local anesthetic has worn off. You can return to your normal diet the day after the procedure. °· Get help right away if you develop worsening shortness of breath, have chest pain, become light-headed, or cough up more than a small amount of blood. °This information is not intended to replace advice given to you by your health care provider. Make sure you discuss any questions you have with your health care provider. °Document Released: 05/23/2005 Document Revised: 11/21/2016 Document Reviewed: 11/21/2016 °Elsevier Interactive Patient Education © 2017 [employer] ° °

## 2017-04-08 NOTE — Anesthesia Procedure Notes (Signed)
Procedure Name: Intubation Date/Time: 04/08/2017 12:34 PM Performed by: Candis Shine Pre-anesthesia Checklist: Patient identified, Emergency Drugs available, Suction available and Patient being monitored Patient Re-evaluated:Patient Re-evaluated prior to inductionOxygen Delivery Method: Circle System Utilized Preoxygenation: Pre-oxygenation with 100% oxygen Intubation Type: IV induction Ventilation: Mask ventilation without difficulty Laryngoscope Size: Mac and 4 Grade View: Grade II Tube type: Oral Tube size: 9.0 mm Number of attempts: 1 Airway Equipment and Method: Stylet and Oral airway Placement Confirmation: ETT inserted through vocal cords under direct vision,  positive ETCO2 and breath sounds checked- equal and bilateral Secured at: 24 cm Tube secured with: Tape Dental Injury: Teeth and Oropharynx as per pre-operative assessment

## 2017-04-08 NOTE — H&P (Signed)
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 The patient had undergone bronchoscopy and endobronchial ultrasound with biopsy of the 10 R and 4R lymph nodes on 03/23/2017 by Dr. Elsworth Soho, no diagnostic material was obtained. Patient has now been referred to thoracic surgery for repeat biopsies.   He is a long-term smoker stopping in December 2017 or January 2018. He noted feeling sick most of the month of February 2018, with cough and congestion no hemoptysis. This led to a chest x-ray in March and a new right hilar mass was noted. Since he is had a CT of the chest PET scan of the chest and attempted biopsy but without any diagnostic material.  He has a history of hypertension diabetes hyperlipidemia and chronic kidney disease, is followed by Dr. Joelyn Oms  Current Activity/ Functional Status:  Patient is independent with mobility/ambulation, transfers, ADL's, IADL's.   Zubrod Score: At the time of surgery this patient's most appropriate activity status/level should be described as: '[]'$     0    Normal activity, no symptoms '[x]'$     1    Restricted in physical strenuous activity but ambulatory, able to do out light work '[]'$     2    Ambulatory and capable of self care, unable to do work activities, up and about               >50 % of waking hours                              '[]'$     3    Only limited self care, in bed greater than 50% of waking hours '[]'$     4    Completely disabled, no self care, confined to bed or chair '[]'$     5    Moribund   Past Medical History:  Diagnosis Date  . Diabetes  mellitus without complication ([place])   . Hyperlipidemia   . Hypertension   . Irregular heart rate   . Renal insufficiency     Past Surgical History:  Procedure Laterality Date  . cyst removed    . ENDOBRONCHIAL ULTRASOUND Bilateral 03/23/2017   Procedure: ENDOBRONCHIAL ULTRASOUND;  Surgeon: Rigoberto Noel, MD;  Location: [facility];  Service: Cardiopulmonary;  Laterality: Bilateral;  . EYE SURGERY    . MOUTH SURGERY    . ROTATOR CUFF REPAIR    . tumor remoced from neck      Family History  Problem Relation Age of Onset  . Kidney disease Mother   . Heart disease Father     Social History   Social History  . Marital status: Married    Spouse name: N/A  . Number of children: 1  . Years of education: N/A   Occupational History  . Retired Health and safety inspector    Social History Main  Topics  . Smoking status: Former Smoker    Packs/day: 0.10    Types: Cigarettes    Quit date: 11/28/2016  . Smokeless tobacco: Never Used  . Alcohol use No  . Drug use: No  . Sexual activity: Not on file      History  Smoking Status  . Former Smoker  . Packs/day: 0.10  . Types: Cigarettes  . Quit date: 11/28/2016  Smokeless Tobacco  . Never Used    History  Alcohol Use No     No Known Allergies  Current Facility-Administered Medications  Medication Dose Route Frequency Provider Last Rate Last Dose  . 0.9 %  sodium chloride infusion   Intravenous Continuous Ellender, Karyl Kinnier, MD 10 mL/hr at 04/08/17 0954    . cefUROXime (ZINACEF) 1.5 g in dextrose 5 % 50 mL IVPB  1.5 g Intravenous 60 min Pre-Op Grace Isaac, MD          Review of Systems:     Cardiac Review of Systems: Y or N  Chest Pain [ n   ]  Resting SOB [  n ] Exertional SOB  [ y ]  Orthopnea [ n ]   Pedal Edema [ y  ]    Palpitations [  n] Syncope  [n ]   Presyncope [ n  ]  General Review of Systems: [Y] = yes [  ]=no Constitional: recent weight change [  ];  Wt loss over the last 3 months [   ] anorexia [  ]; fatigue [   ]; nausea [  ]; night sweats [  ]; fever [  ]; or chills [  ];          Dental: poor dentition[  ]; Last Dentist visit:   Eye : blurred vision [  ]; diplopia [   ]; vision changes [  ];  Amaurosis fugax[  ]; Resp: cough [name]  ];  wheezing[y  ];  hemoptysis[n  ]; shortness of breath[ y ]; paroxysmal nocturnal dyspnea[  ]; dyspnea on exertion[  ]; or orthopnea[  ];  GI:  gallstones[  ], vomiting[  ];  dysphagia[  ]; melena[  ];  hematochezia [  ]; heartburn[  ];   Hx of  Colonoscopy[  ]; GU: kidney stones [  ]; hematuria[  ];   dysuria [  ];  nocturia[  ];  history of     obstruction [  ]; urinary frequency [  ]             Skin: rash, swelling[  ];, hair loss[  ];  peripheral edema[  ];  or itching[  ]; Musculosketetal: myalgias[  ];  joint swelling[  ];  joint erythema[  ];  joint pain[  ];  back pain[  ];  Heme/Lymph: bruising[  ];  bleeding[  ];  anemia[  ];  Neuro: TIA[ n ];  headaches[  ];  stroke[n  ];  vertigo[  ];  seizures[n  ];   paresthesias[  ];  difficulty walking[n  ];  Psych:depression[  ]; anxiety[  ];  Endocrine: diabetes[  ];  thyroid dysfunction[  ];  Immunizations: Flu up to date [  ]; Pneumococcal up to date [  ];  Other:  Physical Exam: BP (!) 168/65   Pulse 71   Temp 98.1 F (36.7 C) (Oral)   Resp 18   Wt 201 lb (91.2 kg)   SpO2 98%   BMI 27.26 kg/m  PHYSICAL EXAMINATION: General appearance: alert and cooperative Head: Normocephalic, without obvious abnormality, atraumatic Neck: no adenopathy, no carotid bruit, no JVD, supple, symmetrical, trachea midline and thyroid not enlarged, symmetric, no tenderness/mass/nodules Lymph nodes: Cervical, supraclavicular, and axillary nodes normal. and On exam I cannot palpate any parotid mass on either side Resp: diminished breath sounds bibasilar Back: symmetric, no curvature. ROM normal. No CVA tenderness. Cardio: regular rate and rhythm, S1, S2 normal, no murmur, click, rub or gallop GI: soft, non-tender; bowel sounds  normal; no masses,  no organomegaly Extremities: extremities normal, atraumatic, no cyanosis or edema and Homans sign is negative, no sign of DVT Neurologic: Grossly normal  Diagnostic Studies & Laboratory data:     Recent Radiology Findings:  Dg Chest 2 View  Result Date: 04/08/2017 CLINICAL DATA:  Preop evaluation for upcoming lung biopsy, initial encounter EXAM: CHEST  2 VIEW COMPARISON:  03-05-2017 FINDINGS: Cardiac shadow is stable. Persistent right hilar and suprahilar mass lesion is noted consistent with the patient's given clinical history. The lungs are well aerated bilaterally. No focal parenchymal nodules are seen. IMPRESSION: Stable right hilar and suprahilar mass lesion. No acute abnormality noted. Electronically Signed   By: Inez Catalina M.D.   On: 04/08/2017 09:42   Nm Pet Image Initial (pi) Skull Base To Thigh  Result Date: 03/20/2017 CLINICAL DATA:  Initial treatment strategy for right-sided lung mass. EXAM: NUCLEAR MEDICINE PET SKULL BASE TO THIGH TECHNIQUE: 10.0 mCi F-18 FDG was injected intravenously. Full-ring PET imaging was performed from the skull base to thigh after the radiotracer. CT data was obtained and used for attenuation correction and anatomic localization. FASTING BLOOD GLUCOSE:  Value: 107 mg/dl COMPARISON:  Chest CT 02/26/2017 FINDINGS: NECK Deep left parotid hypermetabolic nodule measures 6 mm and a S.U.V. max of 7.2 on image 19/series 4. No cervical nodal hypermetabolism.  No cervical adenopathy. CHEST Right suprahilar/ upper lobe lung mass with direct extension to or contiguous adenopathy within the right hilum. Involvement of the right-sided mediastinum. This measures 5.6 x 5.0 cm and a S.U.V. max of 8.2 on image 69/series 4. Prevascular node measures 10 mm and a S.U.V. max of 3.9 on image 65/series 4. Hypermetabolic middle mediastinal nodes, including a precarinal node which measures 1.3 cm and a S.U.V. max of 6.2 on image 73/series 4. Mild cardiomegaly with  coronary artery atherosclerosis. Centrilobular emphysema. Other chest findings deferred to prior diagnostic CT. ABDOMEN/PELVIS No abdominopelvic nodal or parenchymal hypermetabolism. Mild renal cortical thinning bilaterally. Normal adrenal glands. Multiple small gallstones, without acute cholecystitis. Aortic and branch vessel atherosclerosis. Moderate prostatomegaly. Left larger than right fat containing inguinal hernias. Large left and moderate right-sided hydrocele. SKELETON No abnormal marrow activity. No focal osseous lesion. Degenerative partial fusion of the bilateral sacroiliac joints. Lumbar facet arthropathy. IMPRESSION: 1. Primary bronchogenic neoplasm with epicenter in the central right upper lobe. Direct extension to versus contiguous adenopathy within the right hilum. Direct extension into the right side of the mediastinum. 2. Thoracic nodal metastasis. 3. Hypermetabolic left parotid nodule is suspicious for a benign or malignant primary neoplasm. 4.  Coronary artery atherosclerosis. Aortic atherosclerosis. 5. Cholelithiasis. Electronically Signed   By: Abigail Miyamoto M.D.   On: 03-05-2017 09:33   CLINICAL DATA:  Follow-up abnormal chest x-ray  EXAM: CT CHEST WITHOUT CONTRAST  TECHNIQUE: Multidetector CT imaging of the chest was performed following the standard protocol without IV contrast. Contrast could not be administered due to elevated creatinine  COMPARISON:  02/11/2017  FINDINGS: Cardiovascular: Somewhat limited due to lack  of IV contrast. Aortic calcifications are seen without aneurysmal dilatation. Coronary calcifications are seen. No significant cardiac enlargement is noted.  Mediastinum/Nodes: The thoracic inlet is within normal limits. Scattered mediastinal lymph nodes are identified. The largest of these lies in the precarinal region measuring 13.5 mm in short axis. Emanating from the right hilum superiorly is a soft tissue mass which measures approximately 6.6  by 4.8 cm in greatest AP and transverse dimensions. It abuts the superior vena cava distally and attenuates the right upper lobe bronchial tree somewhat.  Lungs/Pleura: Left lung is well aerated with mild atelectatic changes. Similar dependent changes are noted in the right lung. The previously described right hilar and suprahilar mass lesion is identified. A small right lower lobe subpleural nodule is noted which measures 5 mm in greatest dimension. This is best seen on image number 98 of series 4. Partially calcified nodule is noted in the posterior right upper lobe best seen on image number 51. This is adjacent to the suprahilar mass lesion and made the a small extension with mild calcification. This may represent a small partially calcified granuloma.  Upper Abdomen: The adrenal glands are within normal limits. The remainder of the upper abdomen is unremarkable.  Musculoskeletal: Degenerative changes of the thoracic spine are seen. No destructive bony lesions are seen.  IMPRESSION: Changes consistent with right hilar and suprahilar mass lesion consistent with primary pulmonary neoplasm. Associated mediastinal adenopathy is noted Direct visualization and biopsy with bronchoscopy is recommended. PET-CT would be helpful in staging as well.  These results will be called to the ordering clinician or representative by the Radiologist Assistant, and communication documented in the PACS or zVision Dashboard.   Electronically Signed   By: Inez Catalina M.D.   On: 02/27/2017 08:20  I have independently reviewed the above radiology studies  and reviewed the findings with the patient.   Recent Lab Findings: Lab Results  Component Value Date   WBC 7.7 04/08/2017   HGB 11.7 (L) 04/08/2017   HCT 35.3 (L) 04/08/2017   PLT 212 04/08/2017   GLUCOSE 107 (H) 04/08/2017   CHOL 159 02/04/2017   TRIG 86 02/04/2017   HDL 39 (L) 02/04/2017   LDLCALC 103 (H) 02/04/2017   ALT 35  04/08/2017   AST 19 04/08/2017   NA 139 04/08/2017   K 3.8 04/08/2017   CL 109 04/08/2017   CREATININE 2.37 (H) 04/08/2017   BUN 34 (H) 04/08/2017   CO2 22 04/08/2017   INR 0.99 04/08/2017   HGBA1C 5.6 01/10/2016   Chronic Kidney Disease   Stage I     GFR >90  Stage II    GFR 60-89  Stage IIIA GFR 45-59  Stage IIIB GFR 30-44  Stage IV   GFR 15-29  Stage V    GFR  <15  Lab Results  Component Value Date   CREATININE 2.37 (H) 04/08/2017   Estimated Creatinine Clearance: 25.5 mL/min (A) (by C-G formula based on SCr of 2.37 mg/dL (H)). Est gfr 28 -30     Assessment / Plan:   Right upper lobe mass consistent with malignancy with  Invasion of mediastinum and PET positive #7 lymph node, likely clinical stage  IIIB, T4, N2, MO stage IV chronic kidney disease, with elevated creatinine to 2.4  Diabetes with complications of renal insufficiency Hypertension Hyperlipidemia  The goals risks and alternatives of the planned surgical procedure Procedure(s): VIDEO BRONCHOSCOPY WITH ENDOBRONCHIAL ULTRASOUND (N/A) MEDIASTINOSCOPY (N/A)  have been discussed with the patient in  detail. The risks of the procedure including death, infection, stroke, myocardial infarction, bleeding, blood transfusion have all been discussed specifically.  I have quoted [name] a 2% of perioperative mortality and a complication rate as high as 30 %. The patient's questions have been answered.DEJION GRILLO is willing  to proceed with the planned procedure.  Grace Isaac MD      [address] [place] [address] Office [place] [phone]  04/08/2017 11:45 AM

## 2017-04-08 NOTE — Brief Op Note (Signed)
04/08/2017  4:10 PM  PATIENT:  Ruben Conner  83 y.o. male  PRE-OPERATIVE DIAGNOSIS:  LUNG MASS  MEDIASTINAL ADENOPATHY  POST-OPERATIVE DIAGNOSIS:  LUNG MASS  MEDIASTINAL ADENOPATHY  PROCEDURE:  Procedure(s): VIDEO BRONCHOSCOPY WITH ENDOBRONCHIAL ULTRASOUND (N/A) MEDIASTINOSCOPY (N/A)  SURGEON:  Surgeon(s) and Role:    * Grace Isaac, MD - Primary    ANESTHESIA:   general  EBL:  Total I/O In: 1000 [I.V.:1000] Out: 75 [Blood:75]  BLOOD ADMINISTERED:none  DRAINS: none   LOCAL MEDICATIONS USED:  NONE  SPECIMEN:  Source of Specimen:  # 7 , 10R 4 R lymph nodes   DISPOSITION OF SPECIMEN:  PATHOLOGY  COUNTS:  YES   DICTATION: .Dragon Dictation  PLAN OF CARE: Discharge to home after PACU  PATIENT DISPOSITION:  PACU - hemodynamically stable.   Delay start of Pharmacological VTE agent (>24hrs) due to surgical blood loss or risk of bleeding: yes

## 2017-04-08 NOTE — Transfer of Care (Signed)
Immediate Anesthesia Transfer of Care Note  Patient: Ruben Conner  Procedure(s) Performed: Procedure(s): VIDEO BRONCHOSCOPY WITH ENDOBRONCHIAL ULTRASOUND (N/A) MEDIASTINOSCOPY (N/A)  Patient Location: PACU  Anesthesia Type:General  Level of Consciousness: awake, alert  and oriented  Airway & Oxygen Therapy: Patient Spontanous Breathing and Patient connected to face mask oxygen  Post-op Assessment: Report given to RN, Post -op Vital signs reviewed and stable and Patient moving all extremities X 4  Post vital signs: Reviewed and stable  Last Vitals:  Vitals:   04/08/17 0852 04/08/17 1624  BP: (!) 168/65   Pulse: 71   Resp: 18   Temp: 36.7 C (P) 36.2 C    Last Pain:  Vitals:   04/08/17 0852  TempSrc: Oral         Complications: No apparent anesthesia complications

## 2017-04-08 NOTE — Anesthesia Preprocedure Evaluation (Addendum)
Anesthesia Evaluation  Patient identified by MRN, date of birth, ID band Patient awake    Reviewed: Allergy & Precautions, NPO status , Patient's Chart, lab work & pertinent test results  Airway Mallampati: IV  TM Distance: >3 FB Neck ROM: Full    Dental no notable dental hx.    Pulmonary former smoker,  Right hilar mass   Pulmonary exam normal breath sounds clear to auscultation       Cardiovascular hypertension, Pt. on medications Normal cardiovascular exam Rhythm:Regular Rate:Normal  ECG: SB, 1st degree AV block, rate 50 Sees cardiologist [facility]) in [place]   Neuro/Psych negative neurological ROS  negative psych ROS   GI/Hepatic negative GI ROS, Neg liver ROS,   Endo/Other  diabetes, Well Controlled, Oral Hypoglycemic Agents  Renal/GU Renal InsufficiencyRenal diseaseCKD  negative genitourinary   Musculoskeletal negative musculoskeletal ROS (+)   Abdominal   Peds negative pediatric ROS (+)  Hematology  (+) anemia ,   Anesthesia Other Findings Hyperlipidemia  Reproductive/Obstetrics negative OB ROS                            Anesthesia Physical Anesthesia Plan  ASA: III  Anesthesia Plan: General   Post-op Pain Management:    Induction: Intravenous  Airway Management Planned: Oral ETT  Additional Equipment:   Intra-op Plan:   Post-operative Plan: Extubation in OR and Possible Post-op intubation/ventilation  Informed Consent: I have reviewed the patients History and Physical, chart, labs and discussed the procedure including the risks, benefits and alternatives for the proposed anesthesia with the patient or authorized representative who has indicated his/her understanding and acceptance.   Dental advisory given  Plan Discussed with: CRNA  Anesthesia Plan Comments:        Anesthesia Quick Evaluation

## 2017-04-09 ENCOUNTER — Encounter: Payer: Self-pay | Admitting: *Deleted

## 2017-04-09 ENCOUNTER — Encounter (HOSPITAL_COMMUNITY): Payer: Self-pay | Admitting: Cardiothoracic Surgery

## 2017-04-09 NOTE — Op Note (Deleted)
  The note originally documented on this encounter has been moved the the encounter in which it belongs.  

## 2017-04-09 NOTE — Progress Notes (Signed)
Oncology Nurse Navigator Documentation  Oncology Nurse Navigator Flowsheets 04/09/2017  Navigator Location [facility]  Navigator Encounter Type Other/per Dr. Benay Spice, I contacted pathology dept to get an update on specimen.  I spoke with Dr. Lyndon Code and he needs more staining and will update me tomorrow. I will update Dr. Benay Spice.   Treatment Phase Pre-Tx/Tx Discussion  Barriers/Navigation Needs Coordination of Care  Interventions Coordination of Care  Coordination of Care Other  Acuity Level 2  Time Spent with Patient 30

## 2017-04-09 NOTE — Op Note (Signed)
NAME:  Ruben Conner NO.:  [id number]  MEDICAL RECORD NO.:  [id number]  LOCATION:                                 FACILITY:  PHYSICIAN:  Lanelle Bal, MD    DATE OF BIRTH:  1930-08-18  DATE OF PROCEDURE:  04/08/2017 DATE OF DISCHARGE:                              OPERATIVE REPORT   PREOPERATIVE DIAGNOSES:  Right upper lobe mass with mediastinal invasion and mediastinal adenopathy.  Probable stage III B carcinoma of the lung.  POSTOPERATIVE DIAGNOSES:  Right upper lobe mass with mediastinal invasion and mediastinal adenopathy.  Probable stage III B carcinoma of the lung.  Final path pending.  PROCEDURE PERFORMED: 1. Bronchoscopy with EBUS and biopsies of #7 and 4R lymph node. 2. Mediastinoscopy with biopsy of #7 and 4R lymph node.  SURGEON:  Lanelle Bal, M.D.  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  Subsequent x- rays confirmed a large significant right upper lobe lung mass. Evaluation with PET scan and CT scan confirmed radiographic appearance of stage III B carcinoma of the lung.  The patient was referred by Pulmonary after bronchoscopy and attempted EBUS returned no diagnostic material.  Repeat bronchoscopy and EBUS, possible mediastinoscopy were recommended to the patient, who agreed and signed informed consent.  DESCRIPTION OF PROCEDURE:  The patient underwent general endotracheal anesthesia without incidence.  Single-lumen endotracheal tube had been placed.  Appropriate time-out was performed and we proceeded with bronchoscopy with a 2 mm bronchoscope.  Examination to the subsegmental level and both the left and right tracheobronchial tree was done.  There was some slight narrowing of the subsegmental bronchi in the right upper lobe, but no definite endobronchial lesions were appreciated.  We removed the videoscope and the EBUS scope was placed.  With the  EBUS scope, we were easily able to identify a very large abnormal node in #7 area and also 4R multiple transbronchial biopsies of both of these areas were submitted and the initial smears had no diagnostic material. Additional frozen section using a very small microbiopsy forceps placed through the bronchial wall in the area of the needle biopsy was unrevealing as far as any diagnostic material per Pathology.  We then decided after multiple attempts of obtaining adequate tissue to proceed with mediastinoscopy.  The chest and neck were prepped with Betadine.  A second time-out was performed.  We then made a small incision in the suprasternal notch and dissected down to the pretracheal fascia, and the dissection was carried down along the pretracheal fascia, and large lymph nodes in the #7 area and a firm area along the right paratracheal area were easily dissected out.  Biopsies from both of these areas were obtained both from the labelled #7 node and #4R node.  Multiple frozen sections were performed and in spite of what was obvious tumor, the pathologist could not make a definitive diagnosis.  Until further biopsies of the 4R area were done where ultimately malignancy was identified with crush artifact suggesting possibly small cell carcinoma. Additional biopsies of the 4R area were submitted for permanent section. Blood loss was less than  75 mL with operative field hemostatic.  The fascia in the neck was closed with interrupted 3-0 Vicryl, running 2-0 Vicryl, subcutaneous tissue, and 4-0 subcuticular stitch in skin edges.  Dermabond was applied.  The patient was awakened and extubated in the operating room and transferred to the recovery room having tolerated the procedure without obvious complication.     Lanelle Bal, MD   ______________________________ Lanelle Bal, MD    EG/MEDQ  D:  04/09/2017  T:  04/09/2017  Job:  [id number]  cc:   Dr. Mathis Bud

## 2017-04-10 ENCOUNTER — Telehealth: Payer: Self-pay | Admitting: Oncology

## 2017-04-10 ENCOUNTER — Telehealth: Payer: Self-pay | Admitting: *Deleted

## 2017-04-10 ENCOUNTER — Encounter: Payer: Self-pay | Admitting: Radiation Oncology

## 2017-04-10 ENCOUNTER — Ambulatory Visit (HOSPITAL_BASED_OUTPATIENT_CLINIC_OR_DEPARTMENT_OTHER): Payer: PPO | Admitting: Oncology

## 2017-04-10 VITALS — BP 141/62 | HR 66 | Temp 98.2°F | Resp 18 | Ht 72.0 in | Wt 203.9 lb

## 2017-04-10 DIAGNOSIS — N289 Disorder of kidney and ureter, unspecified: Secondary | ICD-10-CM | POA: Diagnosis not present

## 2017-04-10 DIAGNOSIS — R918 Other nonspecific abnormal finding of lung field: Secondary | ICD-10-CM | POA: Diagnosis not present

## 2017-04-10 DIAGNOSIS — E119 Type 2 diabetes mellitus without complications: Secondary | ICD-10-CM | POA: Diagnosis not present

## 2017-04-10 LAB — TYPE AND SCREEN
ABO/RH(D): B POS
Antibody Screen: POSITIVE
DAT, IgG: NEGATIVE
Unit division: 0
Unit division: 0

## 2017-04-10 LAB — BPAM RBC
Blood Product Expiration Date: 201806132359
Blood Product Expiration Date: 201806142359
ISSUE DATE / TIME: 201805231435
ISSUE DATE / TIME: 201805231435
Unit Type and Rh: 7300
Unit Type and Rh: 7300

## 2017-04-10 NOTE — Telephone Encounter (Signed)
Oncology Nurse Navigator Documentation  Oncology Nurse Navigator Flowsheets 04/10/2017  Navigator Location [facility]  Navigator Encounter Type Telephone/I received an update from Ruben Conner on pathology and cytology negative for malignancy.  I updated Ruben Conner. He requested I reach out to patient and update him.  I did.  Patient would like to speak with Ruben Conner today.  Ruben Conner updated and patient has an appt to be seen today.  I also reached out to Ruben Conner per Ruben Conner to get an update on his thoughts for tissue DX. I will update Ruben Conner on discussion.    Telephone Outgoing Call  Treatment Phase Pre-Tx/Tx Discussion  Barriers/Navigation Needs Education  Education Other  Interventions Education  Acuity Level 2  Time Spent with Patient 30

## 2017-04-10 NOTE — Progress Notes (Signed)
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 He underwent a bronchoscopy/mediastinoscopy by Dr. Servando Snare on 04/08/2017. Multiple biopsies were obtained including biopsy from level 7, 10 R, and 4R lymph nodes. No endobronchial lesions were seen. Large lymph nodes in the level 7 and 4 areas were biopsied. A frozen section of the level 4R was consistent with malignancy, potentially small cell carcinoma. Additional biopsies of this area were obtained. Multiple cytology samples from the EBUS revealed no malignant cells. The lymph node biopsies from the mediastinum returned negative for carcinoma. [name] reports a mild cough and soreness at the mediastinoscopy site. No other complaint.  Vital signs in last 24 hours:  Blood pressure (!) 141/62, pulse 66, temperature 98.2 F (36.8 C), temperature source Oral, resp. rate 18, height 6' (1.829 m), weight 203 lb 14.4 oz (92.5 kg), SpO2 96 %.    HEENT: Healing mediastinal endoscopy incision at the lower neck  Lymphatics: No cervical, supra-clavicular, or axillary nodes  Resp: Lungs clear bilaterally  Cardio: Regular rate and rhythm  GI: No hepatosplenomegaly  Vascular: No leg edema    Lab Results:  Lab Results  Component Value Date   WBC 7.7 04/08/2017   HGB 11.7 (L) 04/08/2017   HCT 35.3 (L) 04/08/2017   MCV 86.9 04/08/2017   PLT 212 04/08/2017   NEUTROABS 6.3 02/04/2017    CMP     Component Value Date/Time   NA 139 04/08/2017 0905   NA 142 02/04/2017 0957   K 3.8 04/08/2017 0905   CL 109 04/08/2017 0905   CO2 22 04/08/2017 0905   GLUCOSE 107 (H) 04/08/2017 0905   BUN 34 (H) 04/08/2017 0905   BUN 37 (H) 02/04/2017 0957   CREATININE 2.37 (H) 04/08/2017 0905   CALCIUM 9.0 04/08/2017 0905   PROT 6.8 04/08/2017 0905   PROT 6.8 02/04/2017 0957   ALBUMIN 3.7 04/08/2017 0905   ALBUMIN 4.0 02/04/2017 0957   AST 19  04/08/2017 0905   ALT 35 04/08/2017 0905   ALKPHOS 87 04/08/2017 0905   BILITOT 0.8 04/08/2017 0905   BILITOT 0.4 02/04/2017 0957   GFRNONAA 24 (L) 04/08/2017 0905   GFRAA 27 (L) 04/08/2017 0905     Imaging:  Dg Chest 2 View  Result Date: 04/08/2017 CLINICAL DATA:  Preop evaluation for upcoming lung biopsy, initial encounter EXAM: CHEST  2 VIEW COMPARISON:  2020-10-2417 FINDINGS: Cardiac shadow is stable. Persistent right hilar and suprahilar mass lesion is noted consistent with the patient's given clinical history. The lungs are well aerated bilaterally. No focal parenchymal nodules are seen. IMPRESSION: Stable right hilar and suprahilar mass lesion. No acute abnormality noted. Electronically Signed   By: Inez Catalina M.D.   On: 04/08/2017 09:42   Dg Chest Port 1 View  Result Date: 04/08/2017 CLINICAL DATA:  Bronchoscopy. EXAM: PORTABLE CHEST 1 VIEW COMPARISON:  04/08/2017.  PET-CT 2020-10-2417.  CT chest 02/26/2017. FINDINGS: Cardiomegaly with mild bilateral interstitial prominence suggesting mild CHF. Interstitial tumor spread cannot be excluded. Small amount of fluid in the right major fissure. Small left pleural effusion cannot be excluded. Right suprahilar mass again noted. No pneumothorax. Surgical clips upper chest. IMPRESSION: 1. Cardiomegaly with mild increased interstitial prominence and small amount of fluid in a right major fissure and small left effusion. Findings suggest mild CHF. Interstitial tumor spread cannot be excluded. 2. Right suprahilar mass again noted. Electronically Signed   By: Marcello Moores  Register   On: 04/08/2017 16:50    Medications: I have reviewed the patient's current medications.  Assessment/Plan: 1. Right hilar mass, mediastinal adenopathy ? CT chest 02/26/2017 with a right hilar/suprahilar mass and enlargement of several lymph nodes ? PET scan 2020-08-1317-hypermetabolic medial right upper lobe mass, hypermetabolic mediastinal lymph nodes, hypermetabolic left  parotid nodule ? Bronchoscopy 03/23/2017-normal airways, EBUS biopsy of a right paratracheal node in the right upper lung with bronchial brushings/washings returned nondiagnostic ? Bronchoscopy/EBUS and mediastinoscopy 04/08/2017-multiple cytologies and lymph node biopsies revealed no malignancy  2. Diabetes 3. Renal insufficiency 4. 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 He tolerated the repeat bronchoscopy and mediastinoscopy well. The pathology from the procedure has returned negative for malignancy. I discussed the case with Dr. Servando Snare. He indicates there was apparent tumor invading the mediastinum with a frozen section suggesting malignant cells with necrosis. However the final pathology has returned negative.  I discussed the high likelihood of a malignancy with [name]. The differential diagnosis includes lung cancer, lymphoma, and other uncommon tumors. He most likely has lung cancer. I discussed the case with Dr. Tammi Klippel. He will consider a course of palliative radiation. Dr. Servando Snare does not feel additional attempts at a biopsy are likely to yield a diagnosis.  He will see Dr. Tammi Klippel next week to consider radiation to the chest. The case will be presented at the thoracic tumor commerce next week. We will have further discussions with pathology regarding the possibility of additional testing on the biopsy material.  [name] will return for an office visit in one month. I am available to see him sooner as needed.    Betsy Coder, MD  04/10/2017  3:49 PM

## 2017-04-10 NOTE — Telephone Encounter (Signed)
Gave patient AVS and calender per 5/25 LOS. F/u on 6/22

## 2017-04-14 NOTE — Progress Notes (Signed)
Thoracic Location of Tumor / Histology: Lung Mass mediastinal adenopathy  Patient presented on 02/11/2017 to [facility] practice with symptoms of feeling sick since February with cough,congestion and overall poor feeling of a recent former smoker.  Patient had diagnostic xray on 02/11/2017 which showed right hilar mediastinal contour, which further led to CT chest on 02/26/2017 that showed right hilar and suprahilar mass lesion and then PET Jan 12, 202018 which showed    Fine needle endoscopic aspiration completed on 4 specimens on 03/23/2017 resulted as Benign reactive/ reparative changes.  Fine needle endoscopic aspiration completed on 4 specimens on 04/08/2017 resulted as no malignancy cells notified.  Biopsies of lymph node on 43/88/8757 (if applicable) revealed:    Tobacco/Marijuana/Snuff/ETOH use: smoked 0.10 cigarettes ppd/ quit 11/28/2016  Past/Anticipated interventions by cardiothoracic surgery, if any: bronchial aspiration 03/23/2017 and 04/08/2017 and lymph node biopsy's 04/08/2017  Past/Anticipated interventions by medical oncology, if any: none  Signs/Symptoms  Weight changes, if any: no  Respiratory complaints, if any: Reports an occasional cough that is mostly dry. Explains that when his cough is productive it produces cream colored sputum. Denies any difficult or painful swallowing. Denies shortness of breath.  Hemoptysis, if any: none  Pain issues, if any:  none  SAFETY ISSUES:  Prior radiation? no  Pacemaker/ICD? no   Possible current pregnancy? no  Is the patient on methotrexate? no  Current Complaints / other details:  83 year old. 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  Pertinent health history: HTN, DM, Hyperlipidemia, CKD. Anterior neck incision well approximated without redness, drainage or edema.

## 2017-04-15 ENCOUNTER — Ambulatory Visit
Admission: RE | Admit: 2017-04-15 | Discharge: 2017-04-15 | Disposition: A | Discharge status: Home / Self Care | Payer: PPO | Source: Ambulatory Visit | Attending: Radiation Oncology | Admitting: Radiation Oncology

## 2017-04-15 ENCOUNTER — Encounter: Payer: Self-pay | Admitting: Radiation Oncology

## 2017-04-15 VITALS — BP 151/87 | HR 67 | Temp 98.0°F | Resp 16 | Ht 72.0 in | Wt 202.4 lb

## 2017-04-15 DIAGNOSIS — Z510 Encounter for antineoplastic radiation therapy: Secondary | ICD-10-CM | POA: Insufficient documentation

## 2017-04-15 DIAGNOSIS — R599 Enlarged lymph nodes, unspecified: Secondary | ICD-10-CM | POA: Diagnosis not present

## 2017-04-15 DIAGNOSIS — R05 Cough: Secondary | ICD-10-CM | POA: Diagnosis not present

## 2017-04-15 DIAGNOSIS — Z8249 Family history of ischemic heart disease and other diseases of the circulatory system: Secondary | ICD-10-CM | POA: Insufficient documentation

## 2017-04-15 DIAGNOSIS — Z841 Family history of disorders of kidney and ureter: Secondary | ICD-10-CM | POA: Insufficient documentation

## 2017-04-15 DIAGNOSIS — Z9889 Other specified postprocedural states: Secondary | ICD-10-CM | POA: Diagnosis not present

## 2017-04-15 DIAGNOSIS — Z7984 Long term (current) use of oral hypoglycemic drugs: Secondary | ICD-10-CM | POA: Diagnosis not present

## 2017-04-15 DIAGNOSIS — C771 Secondary and unspecified malignant neoplasm of intrathoracic lymph nodes: Secondary | ICD-10-CM | POA: Diagnosis not present

## 2017-04-15 DIAGNOSIS — E785 Hyperlipidemia, unspecified: Secondary | ICD-10-CM | POA: Insufficient documentation

## 2017-04-15 DIAGNOSIS — Z79899 Other long term (current) drug therapy: Secondary | ICD-10-CM | POA: Diagnosis not present

## 2017-04-15 DIAGNOSIS — C3411 Malignant neoplasm of upper lobe, right bronchus or lung: Secondary | ICD-10-CM | POA: Diagnosis not present

## 2017-04-15 DIAGNOSIS — K5900 Constipation, unspecified: Secondary | ICD-10-CM | POA: Diagnosis not present

## 2017-04-15 DIAGNOSIS — E119 Type 2 diabetes mellitus without complications: Secondary | ICD-10-CM | POA: Diagnosis not present

## 2017-04-15 DIAGNOSIS — Z87891 Personal history of nicotine dependence: Secondary | ICD-10-CM | POA: Diagnosis not present

## 2017-04-15 DIAGNOSIS — I119 Hypertensive heart disease without heart failure: Secondary | ICD-10-CM | POA: Insufficient documentation

## 2017-04-15 DIAGNOSIS — Z809 Family history of malignant neoplasm, unspecified: Secondary | ICD-10-CM | POA: Diagnosis not present

## 2017-04-15 NOTE — Progress Notes (Signed)
  Radiation Oncology         [phone] ________________________________  Name: KHYE HOCHSTETLER MRN: [id number]  Date: 04/15/2017  DOB: 1930/09/19  SIMULATION AND TREATMENT PLANNING NOTE    ICD-9-CM ICD-10-CM   1. Primary cancer of right upper lobe of lung (HCC) 162.3 C34.11     DIAGNOSIS:  83 yo man with putative primary non-small cell carcinoma of the right upper lung  NARRATIVE:  The patient was brought to the [facility].  Identity was confirmed.  All relevant records and images related to the planned course of therapy were reviewed.  The patient freely provided informed written consent to proceed with treatment after reviewing the details related to the planned course of therapy. The consent form was witnessed and verified by the simulation staff.  Then, the patient was set-up in a stable reproducible  supine position for radiation therapy.  CT images were obtained.  Surface markings were placed.  The CT images were loaded into the planning software.  Then the target and avoidance structures were contoured.  Treatment planning then occurred.  The radiation prescription was entered and confirmed.  Then, I designed and supervised the construction of a total of 6 medically necessary complex treatment devices, including a BodyFix immobilization mold custom fitted to the patient along with 5 multileaf collimators conformally shaped radiation around the treatment target while shielding critical structures such as the heart and spinal cord maximally.  I have requested : 3D Simulation  I have requested a DVH of the following structures: Left lung, right lung, spinal cord, heart, esophagus, and target.  I have ordered:Nutrition Consult  PLAN:  The patient will receive 30 Gy in 10 fractions with consideration of boost depending on tolerance.  ________________________________  Sheral Apley Tammi Klippel, M.D.

## 2017-04-15 NOTE — Progress Notes (Signed)
[facility]         [phone] ________________________________  Initial outpatient Consultation  Name: Ruben Conner MRN: [id number]  Date: 04/15/2017  DOB: Feb 04, 1930  REFERRING PHYSICIAN: Ladell Pier, MD  DIAGNOSIS: 83 yo man with putative stage IIIB T4 N2 lung cancer    ICD-9-CM ICD-10-CM   1. Malignant neoplasm of right upper lobe of lung (HCC) 162.3 C34.11   2. Primary cancer of right upper lobe of lung ([place]) 162.3 C34.11     HISTORY OF PRESENT ILLNESS::Ruben Conner is a 83 y.o. male who presented to PCP with cough and congestion in February 2018. Routine chest xray on 02/11/17 revealed right hilar fullness. Previous chest xray from 2017 was negative. Pt was referred for a CT of chest on 02/26/17 which revealed a 6.6 x 4.8 cm right hilar mass with associated mediastinal lymphadenopathy (multiple mediastinal lymph nodes were noted including a 13.5 mm precarinal node).  A 5 mm right lower lobe subpleural nodule was noted as well. Upper abdomen was unremarkable.  Further evaluation with PET scan on 03/20/17 showed a 5.6 x 5 cm right suprahilar/right upper lobe mass with direct extension to or contiguous adenopathy within the right hilum with involvement of the right sided mediastinum with SUV max of 8.2 and evidence of thoracic nodal metastasis.  Also noted was a hypermetabolic left parotid nodule, suspicious for a benign or malignant primary neoplasm.  Pt was referred to Dr. Elsworth Soho for bronchoscopy and endobronchial [place] on 03/23/17. Examination of tracheobronchial tree was unremarkable. Pathology from transbronchial biopsies at right paratracheal node and right hilum, right upper lobe brushings, and right upper lobe washings returned nondiagnostic for malignancy.  Pt was referred for further evaluation with Dr. Servando Snare and underwent a repeat bronchoscopy/mediastinoscopy by Dr. Servando Snare on 04/08/17. Multiple biopsies included level 7, 10 R, and 4R lymph nodes. No  endobronchial lesions were noted. Large lymph nodes in the level 7 and 4 areas were biopsied. A frozen section of the level 4R was consistent with malignancy, potentially small cell carcinoma, however, on further sampling of this node, results were non-diagnostic.  Dr. Benay Spice has kindly referred the patient to radiation oncology to discuss empiric palliative radiotherapy. He presents today with his son, Ruben Conner.  On review of systems, he endorses occasional non-productive cough. He denies difficulty swallowing, pain when swallowing, and SOB. Pt endorses intermittent constipation.   PREVIOUS RADIATION THERAPY: No  Past Medical History:  Diagnosis Date  . Diabetes mellitus without complication ([place])   . Hyperlipidemia   . Hypertension   . Irregular heart rate   . Renal insufficiency   :   Past Surgical History:  Procedure Laterality Date  . cyst removed    . ENDOBRONCHIAL ULTRASOUND Bilateral 03/23/2017   Procedure: ENDOBRONCHIAL ULTRASOUND;  Surgeon: Rigoberto Noel, MD;  Location: [facility];  Service: Cardiopulmonary;  Laterality: Bilateral;  . EYE SURGERY    . MEDIASTINOSCOPY N/A 04/08/2017   Procedure: MEDIASTINOSCOPY;  Surgeon: Grace Isaac, MD;  Location: [facility];  Service: Thoracic;  Laterality: N/A;  . MOUTH SURGERY    . ROTATOR CUFF REPAIR    . tumor remoced from neck    . VIDEO BRONCHOSCOPY WITH ENDOBRONCHIAL ULTRASOUND N/A 04/08/2017   Procedure: VIDEO BRONCHOSCOPY WITH ENDOBRONCHIAL ULTRASOUND;  Surgeon: Grace Isaac, MD;  Location: [facility];  Service: Thoracic;  Laterality: N/A;  :   Current Outpatient Prescriptions:  .  amLODipine (NORVASC) 5 MG tablet, TAKE (1) TABLET TWICE A DAY., Disp:  60 tablet, Rfl: 4 .  benazepril (LOTENSIN) 20 MG tablet, TAKE (1) TABLET TWICE A DAY., Disp: 60 tablet, Rfl: 4 .  glimepiride (AMARYL) 4 MG tablet, TAKE 1 TABLET DAILY, Disp: 30 tablet, Rfl: 11 .  hydrochlorothiazide (HYDRODIURIL) 25 MG tablet, Take 25 mg by mouth every  morning. , Disp: , Rfl:  .  niacin (NIASPAN) 500 MG CR tablet, Take 500-1,000 mg by mouth 2 (two) times daily. 500 in AM and 1000 in evening, Disp: , Rfl:  .  pioglitazone (ACTOS) 30 MG tablet, TAKE 1 TABLET DAILY, Disp: 30 tablet, Rfl: 11 .  potassium chloride SA (K-DUR,KLOR-CON) 20 MEQ tablet, TAKE 1 TABLET DAILY, Disp: 30 tablet, Rfl: 4 .  travoprost, benzalkonium, (TRAVATAN) 0.004 % ophthalmic solution, Place 1 drop into both eyes at bedtime., Disp: , Rfl: :  No Known Allergies:   Family History  Problem Relation Age of Onset  . Kidney disease Mother   . Heart disease Father   . Cancer Neg Hx   :   Social History   Social History  . Marital status: Married    Spouse name: N/A  . Number of children: 1  . Years of education: N/A   Occupational History  . Retired Health and safety inspector    Social History Main Topics  . Smoking status: Former Smoker    Packs/day: 0.10    Years: 53.00    Types: Cigarettes    Quit date: 11/28/2016  . Smokeless tobacco: Never Used  . Alcohol use No  . Drug use: No  . Sexual activity: No   Other Topics Concern  . Not on file   Social History Narrative  . No narrative on file  :  REVIEW OF SYSTEMS:  A 15 point review of systems is documented in the electronic medical record. This was obtained by the nursing staff. However, I reviewed this with the patient to discuss relevant findings and make appropriate changes.  Pertinent items are noted in HPI.   PHYSICAL EXAM:  Blood pressure (!) 151/87, pulse 67, temperature 98 F (36.7 C), temperature source Oral, resp. rate 16, height 6' (1.829 m), weight 202 lb 6.4 oz (91.8 kg), SpO2 98 %. General appearance: alert and cooperative Neck: no adenopathy, no carotid bruit, no JVD, supple, symmetrical, trachea midline and thyroid not enlarged, symmetric, no tenderness/mass/nodules Resp: diminished breath sounds RUL Cardio: regular rate and rhythm, S1, S2 normal, no murmur, click, rub or gallop Extremities:  extremities normal, atraumatic, no cyanosis or edema  KPS = 90  100 - Normal; no complaints; no evidence of disease. 90   - Able to carry on normal activity; minor signs or symptoms of disease. 80   - Normal activity with effort; some signs or symptoms of disease. 45   - Cares for self; unable to carry on normal activity or to do active work. 60   - Requires occasional assistance, but is able to care for most of his personal needs. 50   - Requires considerable assistance and frequent medical care. 8   - Disabled; requires special care and assistance. 38   - Severely disabled; hospital admission is indicated although death not imminent. 71   - Very sick; hospital admission necessary; active supportive treatment necessary. 10   - Moribund; fatal processes progressing rapidly. 0     - Dead  Karnofsky DA, Abelmann WH, Craver LS and Burchenal [facility] [phone]) The use of the nitrogen mustards in the palliative treatment of carcinoma: with particular reference to bronchogenic carcinoma  Cancer 1 [id number]  LABORATORY DATA:  Lab Results  Component Value Date   WBC 7.7 04/08/2017   HGB 11.7 (L) 04/08/2017   HCT 35.3 (L) 04/08/2017   MCV 86.9 04/08/2017   PLT 212 04/08/2017   Lab Results  Component Value Date   NA 139 04/08/2017   K 3.8 04/08/2017   CL 109 04/08/2017   CO2 22 04/08/2017   Lab Results  Component Value Date   ALT 35 04/08/2017   AST 19 04/08/2017   ALKPHOS 87 04/08/2017   BILITOT 0.8 04/08/2017     RADIOGRAPHY: Dg Chest 2 View  Result Date: 04/08/2017 CLINICAL DATA:  Preop evaluation for upcoming lung biopsy, initial encounter EXAM: CHEST  2 VIEW COMPARISON:  2020-07-1217 FINDINGS: Cardiac shadow is stable. Persistent right hilar and suprahilar mass lesion is noted consistent with the patient's given clinical history. The lungs are well aerated bilaterally. No focal parenchymal nodules are seen. IMPRESSION: Stable right hilar and suprahilar mass lesion. No acute abnormality  noted. Electronically Signed   By: Inez Catalina M.D.   On: 04/08/2017 09:42   Nm Pet Image Initial (pi) Skull Base To Thigh  Result Date: 03/20/2017 CLINICAL DATA:  Initial treatment strategy for right-sided lung mass. EXAM: NUCLEAR MEDICINE PET SKULL BASE TO THIGH TECHNIQUE: 10.0 mCi F-18 FDG was injected intravenously. Full-ring PET imaging was performed from the skull base to thigh after the radiotracer. CT data was obtained and used for attenuation correction and anatomic localization. FASTING BLOOD GLUCOSE:  Value: 107 mg/dl COMPARISON:  Chest CT 02/26/2017 FINDINGS: NECK Deep left parotid hypermetabolic nodule measures 6 mm and a S.U.V. max of 7.2 on image 19/series 4. No cervical nodal hypermetabolism.  No cervical adenopathy. CHEST Right suprahilar/ upper lobe lung mass with direct extension to or contiguous adenopathy within the right hilum. Involvement of the right-sided mediastinum. This measures 5.6 x 5.0 cm and a S.U.V. max of 8.2 on image 69/series 4. Prevascular node measures 10 mm and a S.U.V. max of 3.9 on image 65/series 4. Hypermetabolic middle mediastinal nodes, including a precarinal node which measures 1.3 cm and a S.U.V. max of 6.2 on image 73/series 4. Mild cardiomegaly with coronary artery atherosclerosis. Centrilobular emphysema. Other chest findings deferred to prior diagnostic CT. ABDOMEN/PELVIS No abdominopelvic nodal or parenchymal hypermetabolism. Mild renal cortical thinning bilaterally. Normal adrenal glands. Multiple small gallstones, without acute cholecystitis. Aortic and branch vessel atherosclerosis. Moderate prostatomegaly. Left larger than right fat containing inguinal hernias. Large left and moderate right-sided hydrocele. SKELETON No abnormal marrow activity. No focal osseous lesion. Degenerative partial fusion of the bilateral sacroiliac joints. Lumbar facet arthropathy. IMPRESSION: 1. Primary bronchogenic neoplasm with epicenter in the central right upper lobe. Direct  extension to versus contiguous adenopathy within the right hilum. Direct extension into the right side of the mediastinum. 2. Thoracic nodal metastasis. 3. Hypermetabolic left parotid nodule is suspicious for a benign or malignant primary neoplasm. 4.  Coronary artery atherosclerosis. Aortic atherosclerosis. 5. Cholelithiasis. Electronically Signed   By: Abigail Miyamoto M.D.   On: 2020-07-1217 09:33   Dg Chest Port 1 View  Result Date: 04/08/2017 CLINICAL DATA:  Bronchoscopy. EXAM: PORTABLE CHEST 1 VIEW COMPARISON:  04/08/2017.  PET-CT 2020-07-1217.  CT chest 02/26/2017. FINDINGS: Cardiomegaly with mild bilateral interstitial prominence suggesting mild CHF. Interstitial tumor spread cannot be excluded. Small amount of fluid in the right major fissure. Small left pleural effusion cannot be excluded. Right suprahilar mass again noted. No pneumothorax. Surgical clips upper chest. IMPRESSION: 1. Cardiomegaly with  mild increased interstitial prominence and small amount of fluid in a right major fissure and small left effusion. Findings suggest mild CHF. Interstitial tumor spread cannot be excluded. 2. Right suprahilar mass again noted. Electronically Signed   By: Marcello Moores  Register   On: 04/08/2017 16:50      IMPRESSION: 83 yo man with putative stage IIIB T4 N2 lung cancer of the right upper lobe of the lung  PLAN: Today, we talked to the patient and his son about the findings and work-up thus far.  We discussed the natural history of lung cancer and general treatment, highlighting the role of radiotherapy in the management.  We discussed the available radiation techniques, and focused on the details of logistics and delivery specific to split course radiotherapy.  We anticipate a 2 week course of palliative radiotherapy followed by a 3 week break. We will then repeat simulation to assess response. If he has tolerated treatment well and follow up scans indicate a positive response to radiotherapy, we may consider an  additional 3 week course of external beam radiation at that point with a curative intent.  We reviewed the anticipated acute and late sequelae associated with radiation in this setting, including but not limited to, fatigue and irritation with swallowing.  The patient was encouraged to ask questions that were answered to the best of our ability.     At the end of our discussion, the patient would like to proceed with split course radiation as discussed and will undergo CT simulation following his consult this morning.   We spent 60 minutes minutes face to face with the patient and more than 50% of that time was spent in counseling and/or coordination of care.     Nicholos Johns, PA-C    Tyler Pita, MD  [facility] Direct Dial: [phone]  Fax: [phone] [email or web address]  Skype  [email or web address]   This document serves as a record of services personally performed by Tyler Pita, MD and Freeman Caldron, PA-C. It was created on their behalf by Linward Natal, a trained medical scribe. The creation of this record is based on the scribe's personal observations and the provider's statements to them. This document has been checked and approved by the attending provider.

## 2017-04-15 NOTE — Progress Notes (Signed)
See progress note under physician encounter. 

## 2017-04-20 DIAGNOSIS — C3411 Malignant neoplasm of upper lobe, right bronchus or lung: Secondary | ICD-10-CM | POA: Diagnosis not present

## 2017-04-20 DIAGNOSIS — Z510 Encounter for antineoplastic radiation therapy: Secondary | ICD-10-CM | POA: Diagnosis not present

## 2017-04-22 ENCOUNTER — Ambulatory Visit
Admission: RE | Admit: 2017-04-22 | Discharge: 2017-04-22 | Disposition: A | Discharge status: Home / Self Care | Payer: PPO | Source: Ambulatory Visit | Attending: Radiation Oncology | Admitting: Radiation Oncology

## 2017-04-22 DIAGNOSIS — Z510 Encounter for antineoplastic radiation therapy: Secondary | ICD-10-CM | POA: Diagnosis not present

## 2017-04-22 DIAGNOSIS — C3411 Malignant neoplasm of upper lobe, right bronchus or lung: Secondary | ICD-10-CM | POA: Diagnosis not present

## 2017-04-23 ENCOUNTER — Encounter: Payer: Self-pay | Admitting: Cardiovascular Disease

## 2017-04-23 ENCOUNTER — Ambulatory Visit
Admission: RE | Admit: 2017-04-23 | Discharge: 2017-04-23 | Disposition: A | Discharge status: Home / Self Care | Payer: PPO | Source: Ambulatory Visit | Attending: Radiation Oncology | Admitting: Radiation Oncology

## 2017-04-23 ENCOUNTER — Ambulatory Visit (INDEPENDENT_AMBULATORY_CARE_PROVIDER_SITE_OTHER): Payer: PPO | Admitting: Cardiovascular Disease

## 2017-04-23 VITALS — BP 120/58 | HR 80 | Ht 72.0 in | Wt 204.0 lb

## 2017-04-23 DIAGNOSIS — I447 Left bundle-branch block, unspecified: Secondary | ICD-10-CM

## 2017-04-23 DIAGNOSIS — I440 Atrioventricular block, first degree: Secondary | ICD-10-CM

## 2017-04-23 DIAGNOSIS — E7800 Pure hypercholesterolemia, unspecified: Secondary | ICD-10-CM

## 2017-04-23 DIAGNOSIS — I10 Essential (primary) hypertension: Secondary | ICD-10-CM

## 2017-04-23 DIAGNOSIS — Z510 Encounter for antineoplastic radiation therapy: Secondary | ICD-10-CM | POA: Diagnosis not present

## 2017-04-23 DIAGNOSIS — C3411 Malignant neoplasm of upper lobe, right bronchus or lung: Secondary | ICD-10-CM | POA: Diagnosis not present

## 2017-04-23 NOTE — Progress Notes (Signed)
Patient ID: Ruben Conner, male   DOB: 1930-04-21, 83 y.o.   MRN: [id number]    Cardiology Office Note    Date:  04/23/2017   ID:  Ruben, Conner 06/18/1930, MRN [id number]  PCP:  Chipper Herb, MD  Cardiologist:   Sanda Klein, MD   Chief Complaint  Patient presents with  . Follow-up    History of Present Illness:  Ruben Conner "[name]" is a 83 y.o. male with asymptomatic bradycardia, left bundle branch block and prolonged AV conduction time who returns for routine follow-up. He has systemic hypertension and type 2 diabetes mellitus on oral anti-diabetics. He does not have any known structural heart disease.  He is now receiving radiation therapy to the chest for a presumed primary bronchogenic carcinoma of the central right upper lobe extending to the hilum and mediastinum. The abnormality was first seen on a chest x-ray performed in February following a protracted case of upper respiratory infection and bronchitis. It is hypermetabolic on PET scan, but attempts to obtain a tissue diagnosis with both bronchoscopy and mediastinoscopy have been unsuccessful.   He continues to have occasional mild pedal swelling that resolves after elevating his legs. He otherwise denies cardiovascular complaints. The patient specifically denies any chest pain at rest exertion, dyspnea at rest or with exertion, orthopnea, paroxysmal nocturnal dyspnea, syncope, palpitations, focal neurological deficits, intermittent claudication, lower extremity edema, unexplained weight gain, cough, hemoptysis or wheezing.  He remains concerned by the progressive dementia of his wife, Ruben Conner, who is also my patient. She was also seen in the clinic today. We discussed the implications that his health changes may have on her future.  Past Medical History:  Diagnosis Date  . Diabetes mellitus without complication ([place])   . Hyperlipidemia   . Hypertension   . Irregular heart rate   . Renal insufficiency     Past  Surgical History:  Procedure Laterality Date  . cyst removed    . ENDOBRONCHIAL ULTRASOUND Bilateral 03/23/2017   Procedure: ENDOBRONCHIAL ULTRASOUND;  Surgeon: Rigoberto Noel, MD;  Location: [facility];  Service: Cardiopulmonary;  Laterality: Bilateral;  . EYE SURGERY    . MEDIASTINOSCOPY N/A 04/08/2017   Procedure: MEDIASTINOSCOPY;  Surgeon: Grace Isaac, MD;  Location: [facility];  Service: Thoracic;  Laterality: N/A;  . MOUTH SURGERY    . ROTATOR CUFF REPAIR    . tumor remoced from neck    . VIDEO BRONCHOSCOPY WITH ENDOBRONCHIAL ULTRASOUND N/A 04/08/2017   Procedure: VIDEO BRONCHOSCOPY WITH ENDOBRONCHIAL ULTRASOUND;  Surgeon: Grace Isaac, MD;  Location: [facility];  Service: Thoracic;  Laterality: N/A;    Current Medications: Outpatient Medications Prior to Visit  Medication Sig Dispense Refill  . amLODipine (NORVASC) 5 MG tablet TAKE (1) TABLET TWICE A DAY. 60 tablet 4  . benazepril (LOTENSIN) 20 MG tablet TAKE (1) TABLET TWICE A DAY. 60 tablet 4  . glimepiride (AMARYL) 4 MG tablet TAKE 1 TABLET DAILY 30 tablet 11  . hydrochlorothiazide (HYDRODIURIL) 25 MG tablet Take 25 mg by mouth every morning.     . niacin (NIASPAN) 500 MG CR tablet Take 500-1,000 mg by mouth 2 (two) times daily. 500 in AM and 1000 in evening    . pioglitazone (ACTOS) 30 MG tablet TAKE 1 TABLET DAILY 30 tablet 11  . potassium chloride SA (K-DUR,KLOR-CON) 20 MEQ tablet TAKE 1 TABLET DAILY 30 tablet 4  . travoprost, benzalkonium, (TRAVATAN) 0.004 % ophthalmic solution Place 1 drop into both eyes at bedtime.  No facility-administered medications prior to visit.      Allergies:   Patient has no known allergies.   Social History   Social History  . Marital status: Married    Spouse name: N/A  . Number of children: 1  . Years of education: N/A   Occupational History  . Retired Health and safety inspector    Social History Main Topics  . Smoking status: Former Smoker    Packs/day: 0.10    Years: 53.00    Types:  Cigarettes    Quit date: 11/28/2016  . Smokeless tobacco: Never Used  . Alcohol use No  . Drug use: No  . Sexual activity: No   Other Topics Concern  . None   Social History Narrative  . None     Family History:  The patient's family history includes Heart disease in his father; Kidney disease in his mother.   ROS:   Please see the history of present illness.    ROS All other systems reviewed and are negative.   PHYSICAL EXAM:   VS:  BP (!) 120/58   Pulse 80   Ht 6' (1.829 m)   Wt 204 lb (92.5 kg)   BMI 27.67 kg/m     General: Alert, oriented x3, no distress. Well-developed, well-nourished Head: no evidence of trauma, PERRL, EOMI, no exophtalmos or lid lag, no myxedema, no xanthelasma; normal ears, nose and oropharynx Neck: normal jugular venous pulsations and no hepatojugular reflux; brisk carotid pulses without delay and no carotid bruits Chest: clear to auscultation, no signs of consolidation by percussion or palpation, normal fremitus, symmetrical and full respiratory excursions Cardiovascular: normal position and quality of the apical impulse, regular rhythm, normal first and paradoxically split second heart sounds, no murmurs, rubs or gallops Abdomen: no tenderness or distention, no masses by palpation, no abnormal pulsatility or arterial bruits, normal bowel sounds, no hepatosplenomegaly Extremities: no clubbing, cyanosis or edema; 2+ radial, ulnar and brachial pulses bilaterally; 2+ right femoral, posterior tibial and dorsalis pedis pulses; 2+ left femoral, posterior tibial and dorsalis pedis pulses; no subclavian or femoral bruits Neurological: grossly nonfocal Psych: euthymic mood, full affect  Wt Readings from Last 3 Encounters:  04/23/17 204 lb (92.5 kg)  04/15/17 202 lb 6.4 oz (91.8 kg)  04/10/17 203 lb 14.4 oz (92.5 kg)      Studies/Labs Reviewed:   EKG:  EKG is not ordered today.  The ekg ordered 04/08/2017 demonstrates sinus bradycardia at 50 beats a  minute, very long first-degree AV block with a PR of 380 ms, left bundle branch block with QRS duration 160 ms, QTC 454 ms.  Recent Labs: 04/08/2017: ALT 35; BUN 34; Creatinine, Ser 2.37; Hemoglobin 11.7; Platelets 212; Potassium 3.8; Sodium 139   Lipid Panel    Component Value Date/Time   CHOL 159 02/04/2017 0957   TRIG 86 02/04/2017 0957   TRIG 108 07/06/2014 0936   HDL 39 (L) 02/04/2017 0957   HDL 35 (L) 07/06/2014 0936   CHOLHDL 4.1 02/04/2017 0957   LDLCALC 103 (H) 02/04/2017 0957   LDLCALC 83 07/06/2014 0936     ASSESSMENT:    1. First degree AV block   2. LBBB (left bundle branch block)   3. Essential hypertension   4. Pure hypercholesterolemia     PLAN:  In order of problems listed above:  1. AV block and LBBB: [name] has not had any problems with syncope, fatigue, dizziness or other symptoms that could be attributable to bradycardia. He has evidence of  both sinus node dysfunction and AV node disease with infrahisian involvement, but has not had documentation of pauses or high grade AV block. At this point pacemaker therapy is no necessary. Carefully avoid any negative chronotropic agents. 2. HTN: Well controlled 3. HLP: Target LDL under 100 due to diabetes mellitus. Very close to target on current medical regimen.    Medication Adjustments/Labs and Tests Ordered: Current medicines are reviewed at length with the patient today.  Concerns regarding medicines are outlined above.  Medication changes, Labs and Tests ordered today are listed in the Patient Instructions below. Patient Instructions  Dr Sallyanne Kuster recommends that you schedule a follow-up appointment in 12 months. You will receive a reminder letter in the mail two months in advance. If you don't receive a letter, please call our office to schedule the follow-up appointment.  If you need a refill on your cardiac medications before your next appointment, please call your pharmacy.    Signed, Sanda Klein, MD    04/23/2017 2:15 PM    [facility] [place], [place], [place]  [address] Phone: [phone]; Fax: [phone]

## 2017-04-23 NOTE — Patient Instructions (Signed)
Dr Croitoru recommends that you schedule a follow-up appointment in 12 months. You will receive a reminder letter in the mail two months in advance. If you don't receive a letter, please call our office to schedule the follow-up appointment.  If you need a refill on your cardiac medications before your next appointment, please call your pharmacy. 

## 2017-04-24 ENCOUNTER — Other Ambulatory Visit: Payer: Self-pay | Admitting: Family Medicine

## 2017-04-24 ENCOUNTER — Ambulatory Visit: Payer: PPO | Admitting: Cardiovascular Disease

## 2017-04-24 ENCOUNTER — Ambulatory Visit
Admission: RE | Admit: 2017-04-24 | Discharge: 2017-04-24 | Disposition: A | Discharge status: Home / Self Care | Payer: PPO | Source: Ambulatory Visit | Attending: Radiation Oncology | Admitting: Radiation Oncology

## 2017-04-24 DIAGNOSIS — C3411 Malignant neoplasm of upper lobe, right bronchus or lung: Secondary | ICD-10-CM

## 2017-04-24 DIAGNOSIS — Z510 Encounter for antineoplastic radiation therapy: Secondary | ICD-10-CM | POA: Diagnosis not present

## 2017-04-24 MED ORDER — RADIAPLEXRX EX GEL
Freq: Once | CUTANEOUS | Status: AC
  Administered 2017-04-24: 12:00:00 via TOPICAL

## 2017-04-27 ENCOUNTER — Ambulatory Visit
Admission: RE | Admit: 2017-04-27 | Discharge: 2017-04-27 | Disposition: A | Discharge status: Home / Self Care | Payer: PPO | Source: Ambulatory Visit | Attending: Radiation Oncology | Admitting: Radiation Oncology

## 2017-04-27 DIAGNOSIS — Z510 Encounter for antineoplastic radiation therapy: Secondary | ICD-10-CM | POA: Diagnosis not present

## 2017-04-27 DIAGNOSIS — C3411 Malignant neoplasm of upper lobe, right bronchus or lung: Secondary | ICD-10-CM | POA: Diagnosis not present

## 2017-04-28 ENCOUNTER — Ambulatory Visit
Admission: RE | Admit: 2017-04-28 | Discharge: 2017-04-28 | Disposition: A | Discharge status: Home / Self Care | Payer: PPO | Source: Ambulatory Visit | Attending: Radiation Oncology | Admitting: Radiation Oncology

## 2017-04-28 DIAGNOSIS — C3411 Malignant neoplasm of upper lobe, right bronchus or lung: Secondary | ICD-10-CM | POA: Diagnosis not present

## 2017-04-28 DIAGNOSIS — Z510 Encounter for antineoplastic radiation therapy: Secondary | ICD-10-CM | POA: Diagnosis not present

## 2017-04-29 ENCOUNTER — Ambulatory Visit
Admission: RE | Admit: 2017-04-29 | Discharge: 2017-04-29 | Disposition: A | Discharge status: Home / Self Care | Payer: PPO | Source: Ambulatory Visit | Attending: Radiation Oncology | Admitting: Radiation Oncology

## 2017-04-29 DIAGNOSIS — C3411 Malignant neoplasm of upper lobe, right bronchus or lung: Secondary | ICD-10-CM | POA: Diagnosis not present

## 2017-04-29 DIAGNOSIS — Z510 Encounter for antineoplastic radiation therapy: Secondary | ICD-10-CM | POA: Diagnosis not present

## 2017-04-30 ENCOUNTER — Ambulatory Visit
Admission: RE | Admit: 2017-04-30 | Discharge: 2017-04-30 | Disposition: A | Discharge status: Home / Self Care | Payer: PPO | Source: Ambulatory Visit | Attending: Radiation Oncology | Admitting: Radiation Oncology

## 2017-04-30 DIAGNOSIS — Z510 Encounter for antineoplastic radiation therapy: Secondary | ICD-10-CM | POA: Diagnosis not present

## 2017-04-30 DIAGNOSIS — C3411 Malignant neoplasm of upper lobe, right bronchus or lung: Secondary | ICD-10-CM | POA: Diagnosis not present

## 2017-05-01 ENCOUNTER — Ambulatory Visit
Admission: RE | Admit: 2017-05-01 | Discharge: 2017-05-01 | Disposition: A | Discharge status: Home / Self Care | Payer: PPO | Source: Ambulatory Visit | Attending: Radiation Oncology | Admitting: Radiation Oncology

## 2017-05-01 DIAGNOSIS — Z510 Encounter for antineoplastic radiation therapy: Secondary | ICD-10-CM | POA: Diagnosis not present

## 2017-05-01 DIAGNOSIS — C3411 Malignant neoplasm of upper lobe, right bronchus or lung: Secondary | ICD-10-CM | POA: Diagnosis not present

## 2017-05-01 NOTE — Progress Notes (Signed)
Weight and vitals stable. Denies pain. Denies skin changes within treatment field. However, patient reports often he feels itchy from head to toe. Patient explains he rub himself down with rubbing alcohol for relief. Patient confirms applying radiaplex bid within treatment field as directed. Reports mild fatigue. 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 Reports a rare productive cough with cream colored sputum. Reports SOB with exertion. Denies difficulty swallowing. Scheduled to follow up with Dr. Benay Spice next Friday. One month follow up appointment card given.  BP (!) 147/81 (BP Location: Left Arm, Patient Position: Sitting, Cuff Size: Normal)   Pulse 89   Resp 16   Wt 207 lb 3.2 oz (94 kg)   SpO2 100%   BMI 28.10 kg/m  Wt Readings from Last 3 Encounters:  05/01/17 207 lb 3.2 oz (94 kg)  04/23/17 204 lb (92.5 kg)  04/15/17 202 lb 6.4 oz (91.8 kg)

## 2017-05-02 ENCOUNTER — Other Ambulatory Visit: Payer: Self-pay | Admitting: Family Medicine

## 2017-05-04 ENCOUNTER — Ambulatory Visit
Admission: RE | Admit: 2017-05-04 | Discharge: 2017-05-04 | Disposition: A | Discharge status: Home / Self Care | Payer: PPO | Source: Ambulatory Visit | Attending: Radiation Oncology | Admitting: Radiation Oncology

## 2017-05-04 DIAGNOSIS — Z510 Encounter for antineoplastic radiation therapy: Secondary | ICD-10-CM | POA: Diagnosis not present

## 2017-05-04 DIAGNOSIS — C3411 Malignant neoplasm of upper lobe, right bronchus or lung: Secondary | ICD-10-CM | POA: Diagnosis not present

## 2017-05-05 ENCOUNTER — Ambulatory Visit
Admission: RE | Admit: 2017-05-05 | Discharge: 2017-05-05 | Disposition: A | Discharge status: Home / Self Care | Payer: PPO | Source: Ambulatory Visit | Attending: Radiation Oncology | Admitting: Radiation Oncology

## 2017-05-05 DIAGNOSIS — C3411 Malignant neoplasm of upper lobe, right bronchus or lung: Secondary | ICD-10-CM | POA: Diagnosis not present

## 2017-05-05 DIAGNOSIS — Z510 Encounter for antineoplastic radiation therapy: Secondary | ICD-10-CM | POA: Diagnosis not present

## 2017-05-06 ENCOUNTER — Encounter: Payer: Self-pay | Admitting: Radiation Oncology

## 2017-05-06 NOTE — Progress Notes (Addendum)
  Radiation Oncology         [phone] ________________________________  Name: Ruben Conner MRN: [id number]  Date: 05/06/2017  DOB: 1930/06/27  End of Treatment Note  Diagnosis: 83 yo man with putative stage IIIB T4 N2 lung cancer.  Indication for treatment:  Curative       Radiation treatment dates: 04/22/2017 - 05/05/2017  Site/dose:   The target was treated to 30 Gy in 10 fractions of 3 Gy    Beams/energy:   The patient was treated using a 3D conformal radiotherapy plan.  Static gantry 3D and Volumetric arc fields were employed to deliver 6 MV X-rays.  Image guidance was performed with per fraction cone beam CT prior to treatment under direct MD supervision.  Immobilization was achieved using BodyFix Pillow.  Narrative: The patient tolerated radiation treatment relatively well. He denied skin changes but reported that he often felt itchy from head to toe. Mild fatigue, rare productive cough with cream-colored sputum, and SOB with exertion were also reported. He denied pain and difficulty swallowing.  Plan: The patient has completed his initial phase of radiation treatment. The patient will return to radiation oncology clinic for routine followup in one month. I advised them to call or return sooner if they have any questions or concerns related to their recovery or treatment. The patient will continue to use radiaplex cream to the skin within the treatment fields. ________________________________  Sheral Apley Tammi Klippel, M.D.

## 2017-05-08 ENCOUNTER — Telehealth: Payer: Self-pay | Admitting: Oncology

## 2017-05-08 ENCOUNTER — Ambulatory Visit (HOSPITAL_BASED_OUTPATIENT_CLINIC_OR_DEPARTMENT_OTHER): Payer: PPO | Admitting: Oncology

## 2017-05-08 ENCOUNTER — Other Ambulatory Visit: Payer: Self-pay | Admitting: *Deleted

## 2017-05-08 VITALS — BP 133/69 | HR 84 | Temp 98.2°F | Resp 18 | Ht 72.0 in | Wt 200.6 lb

## 2017-05-08 DIAGNOSIS — C3411 Malignant neoplasm of upper lobe, right bronchus or lung: Secondary | ICD-10-CM

## 2017-05-08 DIAGNOSIS — R590 Localized enlarged lymph nodes: Secondary | ICD-10-CM | POA: Diagnosis not present

## 2017-05-08 DIAGNOSIS — R918 Other nonspecific abnormal finding of lung field: Secondary | ICD-10-CM

## 2017-05-08 NOTE — Telephone Encounter (Signed)
Called to confirm appt with patient - appt was for 7/26 not 6/26 - first los put in wrong . Patient is aware .

## 2017-05-08 NOTE — Telephone Encounter (Signed)
Scheduled appt per 6/22 los - Gave patient AVS and calender per LOS. - patient aware to walk over to radiology for xray for 6/26

## 2017-05-08 NOTE — Progress Notes (Signed)
  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 He completed radiation to the chest beginning 04/22/2017. The final cycle was given 05/05/2017. He reports tolerating the radiation well. No significant cough at present. No complaint.  Objective:  Vital signs in last 24 hours:  Blood pressure 133/69, pulse 84, temperature 98.2 F (36.8 C), temperature source Oral, resp. rate 18, height 6' (1.829 m), weight 200 lb 9.6 oz (91 kg), SpO2 98 %.    HEENT: Neck without mass Lymphatics: No cervical, supraclavicular, or axillary nodes Resp: Lungs clear bilaterally, no respiratory distress Cardio:Irregular GI: No hepatosplenomegaly Vascular: No leg edema   Medications: I have reviewed the patient's current medications.  Assessment/Plan: 1. Right hilar mass, mediastinal adenopathy ? CT chest 02/26/2017 with a right hilar/suprahilar mass and enlargement of several lymph nodes ? PET scan 06/14/202018-hypermetabolic medial right upper lobe mass, hypermetabolic mediastinal lymph nodes, hypermetabolic left parotid nodule ? Bronchoscopy 03/23/2017-normal airways, EBUS biopsy of a right paratracheal node in the right upper lung with bronchial brushings/washings returned nondiagnostic ? Bronchoscopy/EBUS and mediastinoscopy 04/08/2017-multiple cytologies and lymph node biopsies revealed no malignancy ? Radiation to the chest 04/22/2017-05/05/2017, 30Gy in 10 fractions   2. Diabetes 3. Renal insufficiency 4. 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 He completed radiation to the chest on 05/05/2017. Additional review of the pathology from the mediastinoscopy revealed no malignant cells. He understands the most likely diagnosis is lung cancer, but a definitive diagnosis has not been obtained. It would require additional chest surgery to obtain a diagnosis.  Dr. Gilford Rile is  comfortable with observation and repeat imaging. He will return for an office visit and chest x-ray on 06/11/2017. I will contact Dr. Tammi Klippel to discuss the indication for additional radiation.  15 minutes were spent with the patient today. The majority of the time was used for counseling and coordination of care.  Donneta Romberg, MD  05/08/2017  11:24 AM

## 2017-05-12 ENCOUNTER — Ambulatory Visit: Payer: PPO | Admitting: Oncology

## 2017-05-29 ENCOUNTER — Ambulatory Visit
Admission: RE | Admit: 2017-05-29 | Discharge: 2017-05-29 | Disposition: A | Discharge status: Home / Self Care | Payer: PPO | Source: Ambulatory Visit | Attending: Radiation Oncology | Admitting: Radiation Oncology

## 2017-05-29 ENCOUNTER — Ambulatory Visit: Payer: PPO | Admitting: Radiation Oncology

## 2017-05-29 DIAGNOSIS — C3411 Malignant neoplasm of upper lobe, right bronchus or lung: Secondary | ICD-10-CM

## 2017-05-29 DIAGNOSIS — Z510 Encounter for antineoplastic radiation therapy: Secondary | ICD-10-CM | POA: Diagnosis not present

## 2017-05-29 NOTE — Progress Notes (Signed)
  Radiation Oncology         [phone] ________________________________  Name: CORDELRO GAUTREAU MRN: [id number]  Date: 05/29/2017  DOB: 17-Mar-1930  SIMULATION AND TREATMENT PLANNING NOTE    ICD-10-CM   1. Primary cancer of right upper lobe of lung (HCC) C34.11     DIAGNOSIS:  [name] is an 83 y.o. man with putative stage IIIB T4 N2 lung cancer, 3 weeks post 30 Gy in 10 fractions for consideration of split-course.  NARRATIVE:  The patient was brought to the [facility].  Identity was confirmed.  All relevant records and images related to the planned course of therapy were reviewed.  The patient freely provided informed written consent to proceed with treatment after reviewing the details related to the planned course of therapy. The consent form was witnessed and verified by the simulation staff.  Then, the patient was set-up in a stable reproducible  supine position for radiation therapy.  CT images were obtained.  Surface markings were placed.  The CT images were loaded into the planning software.  Then the target and avoidance structures were contoured.  Treatment planning then occurred.  The radiation prescription was entered and confirmed.  Then, I designed and supervised the construction of a total of one medically necessary complex treatment device.  I have requested : 3D Simulation  I have requested a DVH of the following structures: heart, left lung, right lung, spinal cord, and targets.  PLAN:  The patient will receive 24 Gy in 8 fractions, to serve as the second half of a split course creating a cumulative dose of 54 Gy.  ________________________________  Sheral Apley. Tammi Klippel, M.D.  This document serves as a record of services personally performed by Tyler Pita, MD. It was created on his behalf by Rae Lips, a trained medical scribe. The creation of this record is based on the scribe's personal observations and the provider's statements to them. This  document has been checked and approved by the attending provider.

## 2017-05-29 NOTE — Progress Notes (Signed)
Radiation Oncology         [phone] ________________________________  Name: FRAZIER BALFOUR MRN: [id number]  Date: 05/29/2017  DOB: 1930-11-06  Post Treatment Note  CC: Chipper Herb, MD  Ladell Pier, MD  Diagnosis:   83 yo man with putative stage IIIBT4 N2 lung cancer.  Interval Since Last Radiation:  3 weeks  Split course radiotherapy 04/22/2017 - 05/05/2017: The target was treated to 30 Gy in 10 fractions of 3 Gy  Narrative:  The patient returns today for short interval follow-up. The patient tolerated radiation treatment relatively well. He denied skin changes but reported that he often felt itchy from head to toe. Mild fatigue, rare productive cough with cream-colored sputum, and SOB with exertion were also reported. He denied pain and difficulty swallowing.                        We re-scanned today.  The original planning CT is the upper 3 panels and the new planning CT is the lower 3 panels:    The tumor and nodes have decreased slightly and produce less compression of the airway.  On review of systems, the patient states he is feeling great with mild fatigue.  He had some mild dysphasia with hot liquids initially following his previous treatment, but this resolved within 5 days.  He noted mild skin itching on his upper torso which was relieved with the use of a mild lotion.  ALLERGIES:  has No Known Allergies.  Meds: Current Outpatient Prescriptions  Medication Sig Dispense Refill  . amLODipine (NORVASC) 5 MG tablet TAKE (1) TABLET TWICE A DAY. 60 tablet 2  . benazepril (LOTENSIN) 20 MG tablet TAKE (1) TABLET TWICE A DAY. 60 tablet 3  . glimepiride (AMARYL) 4 MG tablet TAKE 1 TABLET DAILY 30 tablet 11  . hydrochlorothiazide (HYDRODIURIL) 25 MG tablet Take 25 mg by mouth every morning.     . niacin (NIASPAN) 500 MG CR tablet Take 500-1,000 mg by mouth 2 (two) times daily. 500 in AM and 1000 in evening    . pioglitazone (ACTOS) 30 MG tablet TAKE 1 TABLET DAILY 30  tablet 11  . potassium chloride SA (K-DUR,KLOR-CON) 20 MEQ tablet TAKE 1 TABLET DAILY 30 tablet 4  . travoprost, benzalkonium, (TRAVATAN) 0.004 % ophthalmic solution Place 1 drop into both eyes at bedtime.    . Wound Cleansers (RADIAPLEX EX) Apply topically.     No current facility-administered medications for this encounter.     Physical Findings:  vitals were not taken for this visit.  /10 In general this is a well appearing Caucasian man in no acute distress. He's alert and oriented x4 and appropriate throughout the examination.    Lab Findings: Lab Results  Component Value Date   WBC 7.7 04/08/2017   HGB 11.7 (L) 04/08/2017   HCT 35.3 (L) 04/08/2017   MCV 86.9 04/08/2017   PLT 212 04/08/2017     Radiographic Findings: No results found.  Impression/Plan: 1. Patient tolerated 1st course of EBRT well and had a favorable response.  We will proceed with an additional 8 fractions of radiotherapy. He is scheduled to begin his treatments on 7/23.  We will continue to follow his progress throughout this treatment.   We spent 20 minutes face to face with the patient and more than 50% of that time was spent in counseling and/or coordination of care.    Nicholos Johns, PA-C    [facility]  Tammi Klippel, [facility] Direct Dial: [phone]  Fax: [phone] [email or web address]  Skype  [email or web address]  This document serves as a record of services personally performed by Tyler Pita, MD and Freeman Caldron, PA-C. It was created on their behalf by Rae Lips, a trained medical scribe. The creation of this record is based on the scribe's personal observations and the providers' statements to them. This document has been checked and approved by the attending providers.

## 2017-06-01 ENCOUNTER — Telehealth: Payer: Self-pay | Admitting: *Deleted

## 2017-06-01 NOTE — Telephone Encounter (Signed)
Call from pt asking if he should keep 7/26 appt. Will he need chest Xray? Pt will complete radiation on 8/1. Will review with provider.

## 2017-06-03 NOTE — Telephone Encounter (Signed)
Called pt, informed him he will not need chest Xray. Office visit rescheduled to 8/16 at 1130. He voiced understanding.

## 2017-06-03 NOTE — Telephone Encounter (Signed)
Can reschedule for about 2 weeks after xrt Cancel cxr

## 2017-06-05 DIAGNOSIS — Z510 Encounter for antineoplastic radiation therapy: Secondary | ICD-10-CM | POA: Diagnosis not present

## 2017-06-05 DIAGNOSIS — C3411 Malignant neoplasm of upper lobe, right bronchus or lung: Secondary | ICD-10-CM | POA: Diagnosis not present

## 2017-06-08 ENCOUNTER — Ambulatory Visit
Admission: RE | Admit: 2017-06-08 | Discharge: 2017-06-08 | Disposition: A | Discharge status: Home / Self Care | Payer: PPO | Source: Ambulatory Visit | Attending: Radiation Oncology | Admitting: Radiation Oncology

## 2017-06-08 DIAGNOSIS — Z510 Encounter for antineoplastic radiation therapy: Secondary | ICD-10-CM | POA: Diagnosis not present

## 2017-06-08 DIAGNOSIS — C3411 Malignant neoplasm of upper lobe, right bronchus or lung: Secondary | ICD-10-CM | POA: Diagnosis not present

## 2017-06-09 ENCOUNTER — Ambulatory Visit
Admission: RE | Admit: 2017-06-09 | Discharge: 2017-06-09 | Disposition: A | Discharge status: Home / Self Care | Payer: PPO | Source: Ambulatory Visit | Attending: Radiation Oncology | Admitting: Radiation Oncology

## 2017-06-09 DIAGNOSIS — Z510 Encounter for antineoplastic radiation therapy: Secondary | ICD-10-CM | POA: Diagnosis not present

## 2017-06-09 DIAGNOSIS — C3411 Malignant neoplasm of upper lobe, right bronchus or lung: Secondary | ICD-10-CM | POA: Diagnosis not present

## 2017-06-10 ENCOUNTER — Ambulatory Visit
Admission: RE | Admit: 2017-06-10 | Discharge: 2017-06-10 | Disposition: A | Discharge status: Home / Self Care | Payer: PPO | Source: Ambulatory Visit | Attending: Radiation Oncology | Admitting: Radiation Oncology

## 2017-06-10 DIAGNOSIS — C3411 Malignant neoplasm of upper lobe, right bronchus or lung: Secondary | ICD-10-CM | POA: Diagnosis not present

## 2017-06-10 DIAGNOSIS — Z510 Encounter for antineoplastic radiation therapy: Secondary | ICD-10-CM | POA: Diagnosis not present

## 2017-06-11 ENCOUNTER — Ambulatory Visit
Admission: RE | Admit: 2017-06-11 | Discharge: 2017-06-11 | Disposition: A | Discharge status: Home / Self Care | Payer: PPO | Source: Ambulatory Visit | Attending: Radiation Oncology | Admitting: Radiation Oncology

## 2017-06-11 ENCOUNTER — Ambulatory Visit: Payer: PPO | Admitting: Oncology

## 2017-06-11 DIAGNOSIS — C3411 Malignant neoplasm of upper lobe, right bronchus or lung: Secondary | ICD-10-CM | POA: Diagnosis not present

## 2017-06-11 DIAGNOSIS — Z510 Encounter for antineoplastic radiation therapy: Secondary | ICD-10-CM | POA: Diagnosis not present

## 2017-06-12 ENCOUNTER — Ambulatory Visit
Admission: RE | Admit: 2017-06-12 | Discharge: 2017-06-12 | Disposition: A | Discharge status: Home / Self Care | Payer: PPO | Source: Ambulatory Visit | Attending: Radiation Oncology | Admitting: Radiation Oncology

## 2017-06-12 DIAGNOSIS — C3411 Malignant neoplasm of upper lobe, right bronchus or lung: Secondary | ICD-10-CM | POA: Diagnosis not present

## 2017-06-12 DIAGNOSIS — Z510 Encounter for antineoplastic radiation therapy: Secondary | ICD-10-CM | POA: Diagnosis not present

## 2017-06-15 ENCOUNTER — Ambulatory Visit
Admission: RE | Admit: 2017-06-15 | Discharge: 2017-06-15 | Disposition: A | Discharge status: Home / Self Care | Payer: PPO | Source: Ambulatory Visit | Attending: Radiation Oncology | Admitting: Radiation Oncology

## 2017-06-15 DIAGNOSIS — Z510 Encounter for antineoplastic radiation therapy: Secondary | ICD-10-CM | POA: Diagnosis not present

## 2017-06-15 DIAGNOSIS — C3411 Malignant neoplasm of upper lobe, right bronchus or lung: Secondary | ICD-10-CM | POA: Diagnosis not present

## 2017-06-16 ENCOUNTER — Ambulatory Visit
Admission: RE | Admit: 2017-06-16 | Discharge: 2017-06-16 | Disposition: A | Discharge status: Home / Self Care | Payer: PPO | Source: Ambulatory Visit | Attending: Radiation Oncology | Admitting: Radiation Oncology

## 2017-06-16 DIAGNOSIS — Z510 Encounter for antineoplastic radiation therapy: Secondary | ICD-10-CM | POA: Diagnosis not present

## 2017-06-16 DIAGNOSIS — C3411 Malignant neoplasm of upper lobe, right bronchus or lung: Secondary | ICD-10-CM | POA: Diagnosis not present

## 2017-06-17 ENCOUNTER — Encounter: Payer: Self-pay | Admitting: Radiation Oncology

## 2017-06-17 ENCOUNTER — Ambulatory Visit
Admission: RE | Admit: 2017-06-17 | Discharge: 2017-06-17 | Disposition: A | Discharge status: Home / Self Care | Payer: PPO | Source: Ambulatory Visit | Attending: Radiation Oncology | Admitting: Radiation Oncology

## 2017-06-17 ENCOUNTER — Ambulatory Visit: Payer: PPO | Admitting: Family Medicine

## 2017-06-17 DIAGNOSIS — Z510 Encounter for antineoplastic radiation therapy: Secondary | ICD-10-CM | POA: Diagnosis not present

## 2017-06-17 DIAGNOSIS — C3411 Malignant neoplasm of upper lobe, right bronchus or lung: Secondary | ICD-10-CM | POA: Diagnosis not present

## 2017-06-21 NOTE — Progress Notes (Signed)
  Radiation Oncology         [phone] ________________________________  Name: Ruben Conner MRN: [id number]  Date: 06/17/2017  DOB: 1930/05/08  End of Treatment Note  Diagnosis: 83 yo man with putative stage IIIB T4 N2 lung cancer.  Indication for treatment:  Curative, Split-Course second half     Radiation treatment dates: 06/08/17-06/17/17  Site/dose:   The target was boosted with 24 Gy in 8 fractions of 3 Gy for a total of 54 Gy at 3 Gy per fraction.  Beams/energy:   The patient was treated using a 3D conformal radiotherapy plan.  Static gantry 3D and Volumetric arc fields were employed to deliver 6 MV X-rays.  Image guidance was performed with per fraction cone beam CT prior to treatment under direct MD supervision.  Immobilization was achieved using BodyFix Pillow.  Narrative: The patient tolerated radiation treatment relatively well. He denied skin changes but reported that he often felt itchy from head to toe. Mild fatigue, rare productive cough with cream-colored sputum, and SOB with exertion were also reported. He denied pain and difficulty swallowing.  Plan: The patient has completed his initial phase of radiation treatment. The patient will return to radiation oncology clinic for routine followup in one month. I advised them to call or return sooner if they have any questions or concerns related to their recovery or treatment.  ________________________________  Sheral Apley. Tammi Klippel, M.D.

## 2017-06-23 ENCOUNTER — Ambulatory Visit: Admission: RE | Admit: 2017-06-23 | Payer: PPO | Source: Ambulatory Visit | Admitting: Urology

## 2017-07-02 ENCOUNTER — Telehealth: Payer: Self-pay | Admitting: Oncology

## 2017-07-02 ENCOUNTER — Other Ambulatory Visit (HOSPITAL_COMMUNITY): Payer: Self-pay | Admitting: Radiation Oncology

## 2017-07-02 ENCOUNTER — Ambulatory Visit (HOSPITAL_BASED_OUTPATIENT_CLINIC_OR_DEPARTMENT_OTHER): Payer: PPO | Admitting: Oncology

## 2017-07-02 VITALS — BP 140/76 | HR 74 | Temp 98.0°F | Resp 18 | Ht 72.0 in | Wt 197.1 lb

## 2017-07-02 DIAGNOSIS — Z923 Personal history of irradiation: Secondary | ICD-10-CM

## 2017-07-02 DIAGNOSIS — C3411 Malignant neoplasm of upper lobe, right bronchus or lung: Secondary | ICD-10-CM

## 2017-07-02 DIAGNOSIS — R590 Localized enlarged lymph nodes: Secondary | ICD-10-CM

## 2017-07-02 NOTE — Telephone Encounter (Signed)
Scheduled appt per 8/16 los - patient is aware of appt date and time.

## 2017-07-02 NOTE — Progress Notes (Signed)
  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 He completed the split course of radiation 06/17/2017. He has an intermittent nonproductive cough. No fever or dyspnea. Oh other complaint.  Objective:  Vital signs in last 24 hours:  Blood pressure 140/76, pulse 74, temperature 98 F (36.7 C), temperature source Oral, resp. rate 18, height 6' (1.829 m), weight 197 lb 1.6 oz (89.4 kg), SpO2 97 %.    HEENT: Neck without mass Lymphatics: No cervical, supraclavicular, or axillary nodes Resp: Lungs clear bilaterally Cardio: Regular rate and rhythm GI: No hepatomegaly Vascular: No leg edema  Skin: Radiation hyperpigmentation at the back   Portacath/PICC-without erythema  Medications: I have reviewed the patient's current medications.  Assessment/Plan: 1. Right hilar mass, mediastinal adenopathy ? CT chest 02/26/2017 with a right hilar/suprahilar mass and enlargement of several lymph nodes ? PET scan 2020/10/617-hypermetabolic medial right upper lobe mass, hypermetabolic mediastinal lymph nodes, hypermetabolic left parotid nodule ? Bronchoscopy 03/23/2017-normal airways, EBUS biopsy of a right paratracheal node in the right upper lung with bronchial brushings/washings returned nondiagnostic ? Bronchoscopy/EBUSand mediastinoscopy 04/08/2017-multiple cytologies and lymph node biopsies revealed no malignancy 2. Radiation to the chest 04/22/2017-05/05/2017, 30Gy in 10 fractions , additional 24Gy in 8 fractions completed 06/17/2017 3. Renal insufficiency 4. History of tobacco use  Dr. Gilford Rile completed the course of chest radiation. He appears stable. I will contact Dr. Tammi Klippel regarding the timing of a restaging chest CT.  He is scheduled for an appointment in radiation oncology on 07/28/2017. He will return for an office visit in 3 months.  15 minutes were spent with the patient today. The  majority of the time was used for counseling and coordination of care.   07/02/2017  1:45 PM

## 2017-07-09 NOTE — Addendum Note (Signed)
Addendum  created 07/09/17 1405 by Roberts Gaudy, MD   Sign clinical note

## 2017-07-15 ENCOUNTER — Encounter: Payer: Self-pay | Admitting: Family Medicine

## 2017-07-15 ENCOUNTER — Ambulatory Visit (INDEPENDENT_AMBULATORY_CARE_PROVIDER_SITE_OTHER): Payer: PPO | Admitting: Family Medicine

## 2017-07-15 VITALS — BP 110/69 | HR 86 | Temp 97.5°F | Ht 72.0 in | Wt 199.0 lb

## 2017-07-15 DIAGNOSIS — C3411 Malignant neoplasm of upper lobe, right bronchus or lung: Secondary | ICD-10-CM | POA: Diagnosis not present

## 2017-07-15 DIAGNOSIS — E119 Type 2 diabetes mellitus without complications: Secondary | ICD-10-CM | POA: Diagnosis not present

## 2017-07-15 DIAGNOSIS — D631 Anemia in chronic kidney disease: Secondary | ICD-10-CM

## 2017-07-15 DIAGNOSIS — N184 Chronic kidney disease, stage 4 (severe): Secondary | ICD-10-CM

## 2017-07-15 DIAGNOSIS — I10 Essential (primary) hypertension: Secondary | ICD-10-CM

## 2017-07-15 DIAGNOSIS — E559 Vitamin D deficiency, unspecified: Secondary | ICD-10-CM | POA: Diagnosis not present

## 2017-07-15 NOTE — Progress Notes (Signed)
Subjective:    Patient ID: Ruben Conner, male    DOB: Jan 12, 1930, 83 y.o.   MRN: [id number]  HPI Pt here for follow up and management of chronic medical problems which includes diabetes and hypertension. He is taking medication regularly.I have been following this patient closely on the computer with multiple biopsies with negative results for cancer. The patient still being treated for cancer and has completed radiation. So far according to him the results have been promising in the mass has gotten smaller in size and he is feeling well and he is positive about the situation. He also sees a cardiologist regularly and has renal insufficiency. He has no particular complaints today either and his weight and vital signs are stable. He will also return for fasting lab work and we'll make sure that a copy of the blood work is sent to his radiation oncologist his pulmonologist and his cardiologist. The patient denies any chest pain shortness of breath trouble swallowing heartburn indigestion nausea vomiting diarrhea or blood in the stool. He's passing his water well other than passing it frequently.   Patient Active Problem List   Diagnosis Date Noted  . Primary cancer of right upper lobe of lung ([place])   . Mediastinal lymphadenopathy 03/10/2017  . Anemia in stage 4 chronic kidney disease ([place]) 09/18/2016  . Vitamin D deficiency 09/18/2016  . Sinus bradycardia 08/31/2014  . First degree AV block 08/31/2014  . LBBB (left bundle branch block) 08/31/2014  . HTN (hypertension) 09/20/2013  . Diabetes ([place]) 05/02/2013  . Hyperlipemia 05/02/2013   Outpatient Encounter Prescriptions as of 07/15/2017  Medication Sig  . amLODipine (NORVASC) 5 MG tablet TAKE (1) TABLET TWICE A DAY.  . benazepril (LOTENSIN) 20 MG tablet TAKE (1) TABLET TWICE A DAY.  Marland Kitchen glimepiride (AMARYL) 4 MG tablet TAKE 1 TABLET DAILY  . hydrochlorothiazide (HYDRODIURIL) 25 MG tablet Take 25 mg by mouth every morning.   . niacin  (NIASPAN) 500 MG CR tablet Take 500-1,000 mg by mouth 2 (two) times daily. 500 in AM and 1000 in evening  . pioglitazone (ACTOS) 30 MG tablet TAKE 1 TABLET DAILY  . potassium chloride SA (K-DUR,KLOR-CON) 20 MEQ tablet TAKE 1 TABLET DAILY  . travoprost, benzalkonium, (TRAVATAN) 0.004 % ophthalmic solution Place 1 drop into both eyes at bedtime.  . Wound Cleansers (RADIAPLEX EX) Apply topically.   No facility-administered encounter medications on file as of 07/15/2017.        Review of Systems  Constitutional: Negative.   HENT: Negative.   Eyes: Negative.   Respiratory: Negative.   Cardiovascular: Negative.   Gastrointestinal: Negative.   Endocrine: Negative.   Genitourinary: Negative.   Musculoskeletal: Negative.   Skin: Negative.   Allergic/Immunologic: Negative.   Neurological: Negative.   Hematological: Negative.   Psychiatric/Behavioral: Negative.        Objective:   Physical Exam  Constitutional: He is oriented to person, place, and time. He appears well-developed and well-nourished. No distress.  The patient is pleasant and alert and thankful for having a positive response to his treatment from the oncologist and the radiation oncologist and his pulmonologist.  HENT:  Head: Normocephalic and atraumatic.  Right Ear: External ear normal.  Left Ear: External ear normal.  Nose: Nose normal.  Mouth/Throat: Oropharynx is clear and moist. No oropharyngeal exudate.  Eyes: Pupils are equal, round, and reactive to light. Conjunctivae and EOM are normal. Right eye exhibits no discharge. Left eye exhibits no discharge. No scleral icterus.  Neck:  Normal range of motion. Neck supple. No thyromegaly present.  No thyromegaly or anterior cervical adenopathy  Cardiovascular: Normal rate, regular rhythm and intact distal pulses.   No murmur heard. The heart has a regular rate and rhythm at 84/m  Pulmonary/Chest: Effort normal and breath sounds normal. No respiratory distress. He has no  wheezes. He has no rales. He exhibits no tenderness.  Good breath sounds anteriorly and posteriorly No chest wall masses No axillary adenopathy  Abdominal: Soft. Bowel sounds are normal. He exhibits no mass. There is no tenderness. There is no rebound and no guarding.  The abdomen is soft without liver or spleen enlargement inguinal adenopathy bruits or masses  Musculoskeletal: Normal range of motion. He exhibits no edema.  Lymphadenopathy:    He has no cervical adenopathy.  Neurological: He is alert and oriented to person, place, and time. He has normal reflexes. No cranial nerve deficit.  Skin: Skin is warm and dry. No rash noted.  Senile purpura on arms  Psychiatric: He has a normal mood and affect. His behavior is normal. Judgment and thought content normal.  Nursing note and vitals reviewed.   BP 110/69 (BP Location: Left Arm)   Pulse 86   Temp (!) 97.5 F (36.4 C) (Oral)   Ht 6' (1.829 m)   Wt 199 lb (90.3 kg)   BMI 26.99 kg/m         Assessment & Plan:  1. Type 2 diabetes mellitus without complication, without long-term current use of insulin (HCC) -Monitor blood sugars periodically and follow diet as closely as possible - BMP8+EGFR; Future - CBC with Differential/Platelet; Future - Lipid panel; Future - Bayer DCA Hb A1c Waived; Future  2. Vitamin D deficiency -Continue current treatment pending results of lab work - CBC with Differential/Platelet; Future - VITAMIN D 25 Hydroxy (Vit-D Deficiency, Fractures); Future  3. Essential hypertension -The blood pressure is good today and he will continue with current treatment - BMP8+EGFR; Future - CBC with Differential/Platelet; Future - Lipid panel; Future - Hepatic function panel; Future  4. Malignant neoplasm of upper lobe of right lung ([place]) -Continue to follow-up with radiation oncology pulmonary and oncologist  5. Anemia in stage 4 chronic kidney disease (HCC) -Continue to follow-up with nephrology and Dr.  Joelyn Oms  Patient Instructions                       Medicare Annual [facility] and the medical providers at [facility] strive to bring you the best medical care.  In doing so we not only want to address your current medical conditions and concerns but also to detect new conditions early and prevent illness, disease and health-related problems.    Medicare offers a yearly Wellness Visit which allows our clinical staff to assess your need for preventative services including immunizations, lifestyle education, counseling to decrease risk of preventable diseases and screening for fall risk and other medical concerns.    This visit is provided free of charge (no copay) for all Medicare recipients. The clinical pharmacists at [facility] have begun to conduct these Wellness Visits which will also include a thorough review of all your medications.    As you primary medical provider recommend that you make an appointment for your Annual Wellness Visit if you have not done so already this year.  You may set up this appointment before you leave today or you may call back ([phone]) and schedule an  appointment.  Please make sure when you call that you mention that you are scheduling your Annual Wellness Visit with the clinical pharmacist so that the appointment may be made for the proper length of time.     Continue current medications. Continue good therapeutic lifestyle changes which include good diet and exercise. Fall precautions discussed with patient. If an FOBT was given today- please return it to our front desk. If you are over 18 years old - you may need Prevnar 83 or the adult Pneumonia vaccine.  **Flu shots are available--- please call and schedule a FLU-CLINIC appointment**  After your visit with [place] today you will receive a survey in the mail or online from [employer] regarding your care with [place]. Please take a moment to fill this  out. Your feedback is very important to [place] as you can help [place] better understand your patient needs as well as improve your experience and satisfaction. WE CARE ABOUT YOU!!!   Continue to follow-up with radiation oncologist Continue to follow-up with cardiology Continue to follow-up with nephrology Continue to follow-up with oncology Monitor blood sugars periodically at home Drink plenty of fluids and stay well hydrated   Arrie Senate MD

## 2017-07-15 NOTE — Patient Instructions (Addendum)
Medicare Annual Wellness Visit  [facility] and the medical providers at [facility] strive to bring you the best medical care.  In doing so we not only want to address your current medical conditions and concerns but also to detect new conditions early and prevent illness, disease and health-related problems.    Medicare offers a yearly Wellness Visit which allows our clinical staff to assess your need for preventative services including immunizations, lifestyle education, counseling to decrease risk of preventable diseases and screening for fall risk and other medical concerns.    This visit is provided free of charge (no copay) for all Medicare recipients. The clinical pharmacists at [facility] have begun to conduct these Wellness Visits which will also include a thorough review of all your medications.    As you primary medical provider recommend that you make an appointment for your Annual Wellness Visit if you have not done so already this year.  You may set up this appointment before you leave today or you may call back ([phone]) and schedule an appointment.  Please make sure when you call that you mention that you are scheduling your Annual Wellness Visit with the clinical pharmacist so that the appointment may be made for the proper length of time.     Continue current medications. Continue good therapeutic lifestyle changes which include good diet and exercise. Fall precautions discussed with patient. If an FOBT was given today- please return it to our front desk. If you are over 63 years old - you may need Prevnar 8 or the adult Pneumonia vaccine.  **Flu shots are available--- please call and schedule a FLU-CLINIC appointment**  After your visit with [place] today you will receive a survey in the mail or online from [employer] regarding your care with [place]. Please take a moment to fill this out. Your feedback is very  important to [place] as you can help [place] better understand your patient needs as well as improve your experience and satisfaction. WE CARE ABOUT YOU!!!   Continue to follow-up with radiation oncologist Continue to follow-up with cardiology Continue to follow-up with nephrology Continue to follow-up with oncology Monitor blood sugars periodically at home Drink plenty of fluids and stay well hydrated

## 2017-07-23 ENCOUNTER — Other Ambulatory Visit: Payer: PPO

## 2017-07-23 DIAGNOSIS — I10 Essential (primary) hypertension: Secondary | ICD-10-CM | POA: Diagnosis not present

## 2017-07-23 DIAGNOSIS — E119 Type 2 diabetes mellitus without complications: Secondary | ICD-10-CM | POA: Diagnosis not present

## 2017-07-23 DIAGNOSIS — E559 Vitamin D deficiency, unspecified: Secondary | ICD-10-CM | POA: Diagnosis not present

## 2017-07-23 LAB — BAYER DCA HB A1C WAIVED: HB A1C (BAYER DCA - WAIVED): 5.9 % (ref <7.0)

## 2017-07-23 NOTE — Addendum Note (Signed)
Addended by: Earlene Plater on: 07/23/2017 09:25 AM   Modules accepted: Orders

## 2017-07-24 LAB — CBC WITH DIFFERENTIAL/PLATELET
Basophils Absolute: 0 10*3/uL (ref 0.0–0.2)
Basos: 1 %
EOS (ABSOLUTE): 0.2 10*3/uL (ref 0.0–0.4)
EOS: 3 %
HEMATOCRIT: 32.8 % — AB (ref 37.5–51.0)
Hemoglobin: 11.7 g/dL — ABNORMAL LOW (ref 13.0–17.7)
Immature Grans (Abs): 0 10*3/uL (ref 0.0–0.1)
Immature Granulocytes: 0 %
LYMPHS ABS: 0.5 10*3/uL — AB (ref 0.7–3.1)
Lymphs: 9 %
MCH: 30.4 pg (ref 26.6–33.0)
MCHC: 35.7 g/dL (ref 31.5–35.7)
MCV: 85 fL (ref 79–97)
MONOS ABS: 0.5 10*3/uL (ref 0.1–0.9)
Monocytes: 8 %
Neutrophils Absolute: 4.8 10*3/uL (ref 1.4–7.0)
Neutrophils: 79 %
Platelets: 219 10*3/uL (ref 150–379)
RBC: 3.85 x10E6/uL — AB (ref 4.14–5.80)
RDW: 14.7 % (ref 12.3–15.4)
WBC: 6 10*3/uL (ref 3.4–10.8)

## 2017-07-24 LAB — BMP8+EGFR
BUN / CREAT RATIO: 17 (ref 10–24)
BUN: 41 mg/dL — ABNORMAL HIGH (ref 8–27)
CO2: 19 mmol/L — ABNORMAL LOW (ref 20–29)
CREATININE: 2.45 mg/dL — AB (ref 0.76–1.27)
Calcium: 9 mg/dL (ref 8.6–10.2)
Chloride: 106 mmol/L (ref 96–106)
GFR calc Af Amer: 27 mL/min/{1.73_m2} — ABNORMAL LOW (ref >59)
GFR calc non Af Amer: 23 mL/min/{1.73_m2} — ABNORMAL LOW (ref >59)
GLUCOSE: 74 mg/dL (ref 65–99)
Potassium: 4.3 mmol/L (ref 3.5–5.2)
Sodium: 142 mmol/L (ref 134–144)

## 2017-07-24 LAB — LIPID PANEL
CHOL/HDL RATIO: 3.8 ratio (ref 0.0–5.0)
Cholesterol, Total: 137 mg/dL (ref 100–199)
HDL: 36 mg/dL — ABNORMAL LOW (ref >39)
LDL Calculated: 82 mg/dL (ref 0–99)
Triglycerides: 93 mg/dL (ref 0–149)
VLDL Cholesterol Cal: 19 mg/dL (ref 5–40)

## 2017-07-24 LAB — HEPATIC FUNCTION PANEL
ALBUMIN: 3.9 g/dL (ref 3.5–4.7)
ALT: 6 IU/L (ref 0–44)
AST: 12 IU/L (ref 0–40)
Alkaline Phosphatase: 86 IU/L (ref 39–117)
Bilirubin Total: 0.3 mg/dL (ref 0.0–1.2)
Bilirubin, Direct: 0.1 mg/dL (ref 0.00–0.40)
TOTAL PROTEIN: 6.9 g/dL (ref 6.0–8.5)

## 2017-07-24 LAB — VITAMIN D 25 HYDROXY (VIT D DEFICIENCY, FRACTURES): Vit D, 25-Hydroxy: 12.5 ng/mL — ABNORMAL LOW (ref 30.0–100.0)

## 2017-07-28 ENCOUNTER — Ambulatory Visit
Admission: RE | Admit: 2017-07-28 | Discharge: 2017-07-28 | Disposition: A | Discharge status: Home / Self Care | Payer: PPO | Source: Ambulatory Visit | Attending: Urology | Admitting: Urology

## 2017-07-28 ENCOUNTER — Telehealth: Payer: Self-pay | Admitting: *Deleted

## 2017-07-28 ENCOUNTER — Ambulatory Visit: Payer: PPO | Admitting: Urology

## 2017-07-28 DIAGNOSIS — C3411 Malignant neoplasm of upper lobe, right bronchus or lung: Secondary | ICD-10-CM

## 2017-07-28 NOTE — Telephone Encounter (Signed)
CALLED PATIENT TO INFORM OF LAB AND CT FOR 08-04-17 - ARRIVAL TIME - 1:45 PM @ [facility], PT. TO HAVE CLEAR LIQUIDS ONLY 4 HRS. PRIOR TO TEST @ [facility] AND HIS FU APPT. HAS BEEN MOVED TO 08-05-17 @ 4 PM WITH ASHLYN BRUNING SPOKE WITH PATIENT AND HE IS AWARE OF THESE APPTS. AND HE IS GOOD WITH THEM.

## 2017-07-30 ENCOUNTER — Other Ambulatory Visit: Payer: Self-pay | Admitting: Family Medicine

## 2017-08-04 ENCOUNTER — Ambulatory Visit: Admission: RE | Admit: 2017-08-04 | Payer: PPO | Source: Ambulatory Visit

## 2017-08-04 ENCOUNTER — Ambulatory Visit (HOSPITAL_COMMUNITY)
Admission: RE | Admit: 2017-08-04 | Discharge: 2017-08-04 | Disposition: A | Discharge status: Home / Self Care | Payer: PPO | Source: Ambulatory Visit | Attending: Radiation Oncology | Admitting: Radiation Oncology

## 2017-08-04 DIAGNOSIS — C3411 Malignant neoplasm of upper lobe, right bronchus or lung: Secondary | ICD-10-CM

## 2017-08-04 DIAGNOSIS — I700 Atherosclerosis of aorta: Secondary | ICD-10-CM | POA: Insufficient documentation

## 2017-08-04 DIAGNOSIS — C3490 Malignant neoplasm of unspecified part of unspecified bronchus or lung: Secondary | ICD-10-CM | POA: Diagnosis not present

## 2017-08-05 ENCOUNTER — Encounter: Payer: Self-pay | Admitting: Urology

## 2017-08-05 ENCOUNTER — Ambulatory Visit
Admission: RE | Admit: 2017-08-05 | Discharge: 2017-08-05 | Disposition: A | Discharge status: Home / Self Care | Payer: PPO | Source: Ambulatory Visit | Attending: Urology | Admitting: Urology

## 2017-08-05 VITALS — BP 131/63 | HR 64 | Temp 98.0°F | Resp 18 | Ht <= 58 in | Wt 199.4 lb

## 2017-08-05 DIAGNOSIS — C3411 Malignant neoplasm of upper lobe, right bronchus or lung: Secondary | ICD-10-CM

## 2017-08-05 NOTE — Progress Notes (Signed)
Radiation Oncology         [phone] ________________________________  Name: Ruben Conner MRN: [id number]  Date: 08/05/2017  DOB: Apr 25, 1930  Post Treatment Note  CC: Chipper Herb, MD  Ladell Pier, MD  Diagnosis:   83 yo man with putative stage IIIBT4 N2 lung cancer.  Interval Since Last Radiation:  6 weeks, Curative, Split-Course second half     06/08/17-06/17/17:  The target was boosted with 24 Gy in 8 fractions of 3 Gy for a total of 54 Gy at 3 Gy per fraction.  First half Split course radiotherapy 04/22/2017 - 05/05/2017: The target was treated to 30Gy in 82fractions of 3Gy  Narrative:  The patient returns today for routine follow-up.  He tolerated radiation treatment relatively well. He denied skin changes but reported that he often felt itchy from head to toe. Mild fatigue, rare productive cough with cream-colored sputum, and SOB with exertion were also reported. He denied pain and difficulty swallowing.                           On review of systems, the patient states that he is doing well overall.  He denies chest pain, productive cough, increased SOB on exertion, dysphagia, fever or chills.  He has a good appetite and is maintaining his weight.  He denies N/V or diarrhea.  He reports that his energy level has returned to normal and he feels well in general.  He has not had any further itching since completion of radiation.  He had a recent follow up CT Chest for restaging and this shows an excellent response to treatment.  The right hilar mass measured previously at 4.8 x 6.6 cm now measures 2.8 x 3.9 cm and a 14 mm precarinal lymph node measured previously in short axis is now 9 mm.  There has been interval improvement in aeration in both posterior lower lobes.  Unfortunately, his son has recently been diagnosed with prostate cancer but it is early stage and will be treated with EBRT at [facility], which is convenient for him since he lives in [place], [place].  ALLERGIES:   has No Known Allergies.  Meds: Current Outpatient Prescriptions  Medication Sig Dispense Refill  . amLODipine (NORVASC) 5 MG tablet TAKE (1) TABLET TWICE A DAY. 60 tablet 4  . benazepril (LOTENSIN) 20 MG tablet TAKE (1) TABLET TWICE A DAY. 60 tablet 3  . glimepiride (AMARYL) 4 MG tablet TAKE 1 TABLET DAILY 30 tablet 11  . hydrochlorothiazide (HYDRODIURIL) 25 MG tablet Take 25 mg by mouth every morning.     . niacin (NIASPAN) 500 MG CR tablet Take 500-1,000 mg by mouth 2 (two) times daily. 500 in AM and 1000 in evening    . pioglitazone (ACTOS) 30 MG tablet TAKE 1 TABLET DAILY 30 tablet 11  . potassium chloride SA (K-DUR,KLOR-CON) 20 MEQ tablet TAKE 1 TABLET DAILY 30 tablet 4  . travoprost, benzalkonium, (TRAVATAN) 0.004 % ophthalmic solution Place 1 drop into both eyes at bedtime.     No current facility-administered medications for this encounter.     Physical Findings:  height is 6" (0.152 m) (abnormal) and weight is 199 lb 6.4 oz (90.4 kg). His oral temperature is 98 F (36.7 C). His blood pressure is 131/63 and his pulse is 64. His respiration is 18 and oxygen saturation is 98%.  Pain Assessment Pain Score: 0-No pain/10 In general this is a well appearing caucasian male  in no acute distress. He's alert and oriented x4 and appropriate throughout the examination. Cardiopulmonary assessment is negative for acute distress and he exhibits normal effort.   Lab Findings: Lab Results  Component Value Date   WBC 6.0 07/23/2017   HGB 11.7 (L) 07/23/2017   HCT 32.8 (L) 07/23/2017   MCV 85 07/23/2017   PLT 219 07/23/2017     Radiographic Findings: Ct Chest Wo Contrast  Result Date: 08/04/2017 CLINICAL DATA:  Lung cancer EXAM: CT CHEST WITHOUT CONTRAST TECHNIQUE: Multidetector CT imaging of the chest was performed following the standard protocol without IV contrast. COMPARISON:  02/26/2017 FINDINGS: Cardiovascular: The heart size is normal. No pericardial effusion. Coronary artery  calcification is evident. Atherosclerotic calcification is noted in the wall of the thoracic aorta. Mediastinum/Nodes: Right hilar mass measured previously at 4.8 x 6.6 cm now measures 2.8 x 3.9 cm. 14 mm precarinal lymph node measured previously in short axis is now 9 mm. No definite left hilar adenopathy on this noncontrast study. The esophagus has normal imaging features. There is no axillary lymphadenopathy. Lungs/Pleura: Interstitial and nodular opacity in the dependent lower lobes bilaterally has decreased in the interval. No pulmonary edema or pleural effusion. Interstitial and airspace disease posterior right upper lobe may be radiation related. Upper Abdomen: Unremarkable. Musculoskeletal: Bone windows reveal no worrisome lytic or sclerotic osseous lesions. IMPRESSION: 1. Interval decrease in right hilar disease 2. Interval decrease in mediastinal lymphadenopathy. 3. Interval improvement in aeration both posterior lower lobes. 4. Coronary artery and Aortic Atherosclerois (ICD10-170.0) Electronically Signed   By: Misty Stanley M.D.   On: 08/04/2017 15:59    Impression/Plan: 1. 83 yo man with putative stage IIIBT4 N2 lung cancer. He tolerated his split course of radiotherapy very well and has recovered from the side effects.  Recent post-treatment CT Chest shows an excellent response to treatment.  He is scheduled for continued follow up with Dr. Benay Spice, next appointment scheduled for 10/02/17.  I advised that while we are happy to continue to participate in his care if clinically indicated, at this point, we will plan to see him back as needed.  Further treatment recommendations and imaging for disease surveillance will be at the discretion of Dr. Benay Spice.  He seems to have a good understanding of this plan and is in agreement.  He knows to call at any time with any questions or concerns related to his previous radiotherapy.    Nicholos Johns, PA-C

## 2017-08-20 ENCOUNTER — Other Ambulatory Visit: Payer: Self-pay | Admitting: Family Medicine

## 2017-08-27 ENCOUNTER — Other Ambulatory Visit: Payer: Self-pay | Admitting: Family Medicine

## 2017-08-31 DIAGNOSIS — N183 Chronic kidney disease, stage 3 (moderate): Secondary | ICD-10-CM | POA: Diagnosis not present

## 2017-09-01 ENCOUNTER — Other Ambulatory Visit: Payer: PPO

## 2017-09-09 DIAGNOSIS — N183 Chronic kidney disease, stage 3 (moderate): Secondary | ICD-10-CM | POA: Diagnosis not present

## 2017-09-09 DIAGNOSIS — R809 Proteinuria, unspecified: Secondary | ICD-10-CM | POA: Diagnosis not present

## 2017-09-09 DIAGNOSIS — I10 Essential (primary) hypertension: Secondary | ICD-10-CM | POA: Diagnosis not present

## 2017-10-02 ENCOUNTER — Ambulatory Visit (HOSPITAL_BASED_OUTPATIENT_CLINIC_OR_DEPARTMENT_OTHER): Payer: PPO | Admitting: Oncology

## 2017-10-02 ENCOUNTER — Encounter: Payer: Self-pay | Admitting: Oncology

## 2017-10-02 VITALS — BP 138/64 | HR 96 | Temp 97.9°F | Resp 18 | Ht 72.0 in | Wt 201.1 lb

## 2017-10-02 DIAGNOSIS — R590 Localized enlarged lymph nodes: Secondary | ICD-10-CM | POA: Diagnosis not present

## 2017-10-02 DIAGNOSIS — N289 Disorder of kidney and ureter, unspecified: Secondary | ICD-10-CM

## 2017-10-02 DIAGNOSIS — R918 Other nonspecific abnormal finding of lung field: Secondary | ICD-10-CM | POA: Diagnosis not present

## 2017-10-02 DIAGNOSIS — Z923 Personal history of irradiation: Secondary | ICD-10-CM

## 2017-10-02 DIAGNOSIS — C3411 Malignant neoplasm of upper lobe, right bronchus or lung: Secondary | ICD-10-CM

## 2017-10-02 NOTE — Progress Notes (Signed)
  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  He underwent a restaging CT of the chest on 08/04/2017.  A right hilar mass has decreased in size.  A precarinal node is also decreased in size.  Interstitial and airspace disease in the posterior right upper lobe is felt to possibly be radiation related.  He feels well.  Good appetite.  He has an intermittent cough.   Objective:  Vital signs in last 24 hours:  Blood pressure 138/64, pulse (!) 9, temperature 97.9 F (36.6 C), temperature source Oral, resp. rate 18, height 6' (1.829 m), weight 201 lb 1.6 oz (91.2 kg), SpO2 96 %.    HEENT: Neck without mass Lymphatics: No cervical, supraclavicular, or axillary nodes Resp: Lungs with inspiratory rhonchi at the right upper posterior chest, no respiratory distress Cardio: Regular rate and rhythm GI: No hepatomegaly, nontender Vascular: No leg edema     Lab Results:  Lab Results  Component Value Date   WBC 6.0 07/23/2017   HGB 11.7 (L) 07/23/2017   HCT 32.8 (L) 07/23/2017   MCV 85 07/23/2017   PLT 219 07/23/2017   NEUTROABS 4.8 07/23/2017    CMP     Component Value Date/Time   NA 142 07/23/2017 0925   K 4.3 07/23/2017 0925   CL 106 07/23/2017 0925   CO2 19 (L) 07/23/2017 0925   GLUCOSE 74 07/23/2017 0925   GLUCOSE 107 (H) 04/08/2017 0905   BUN 41 (H) 07/23/2017 0925   CREATININE 2.45 (H) 07/23/2017 0925   CALCIUM 9.0 07/23/2017 0925   PROT 6.9 07/23/2017 0925   ALBUMIN 3.9 07/23/2017 0925   AST 12 07/23/2017 0925   ALT 6 07/23/2017 0925   ALKPHOS 86 07/23/2017 0925   BILITOT 0.3 07/23/2017 0925   GFRNONAA 23 (L) 07/23/2017 0925   GFRAA 27 (L) 07/23/2017 0925     Medications: I have reviewed the patient's current medications.  Assessment/Plan: 1. Right hilar mass, mediastinal adenopathy ? CT chest 02/26/2017 with a right hilar/suprahilar mass and enlargement of several lymph  nodes ? PET scan 10-05-2017-hypermetabolic medial right upper lobe mass, hypermetabolic mediastinal lymph nodes, hypermetabolic left parotid nodule ? Bronchoscopy 03/23/2017-normal airways, EBUS biopsy of a right paratracheal node in the right upper lung with bronchial brushings/washings returned nondiagnostic ? Bronchoscopy/EBUSand mediastinoscopy 04/08/2017-multiple cytologies and lymph node biopsies revealed no malignancy  Radiation to the chest 04/22/2017-05/05/2017, 30Gy in 10 fractions , additional 24Gy in 8 fractions completed 06/17/2017  CT chest 08/04/2017- decrease in right hilar mass and mediastinal lymphadenopathy 2. Renal insufficiency 3. 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  He will return for an office visit and restaging chest CT in 2 months.  He will contact us in the interim as needed.  He has received an influenza vaccine.  15 minutes were spent with the patient today.  The majority of the time was used for counseling and coordination of care.  Betsy Coder, MD  10/02/2017  4:39 PM

## 2017-12-03 ENCOUNTER — Telehealth: Payer: Self-pay | Admitting: Oncology

## 2017-12-03 ENCOUNTER — Ambulatory Visit (HOSPITAL_COMMUNITY)
Admission: RE | Admit: 2017-12-03 | Discharge: 2017-12-03 | Disposition: A | Discharge status: Home / Self Care | Payer: PPO | Source: Ambulatory Visit | Attending: Oncology | Admitting: Oncology

## 2017-12-03 ENCOUNTER — Encounter: Payer: Self-pay | Admitting: Oncology

## 2017-12-03 ENCOUNTER — Inpatient Hospital Stay: Payer: PPO | Attending: Oncology | Admitting: Oncology

## 2017-12-03 VITALS — BP 133/69 | HR 81 | Temp 97.8°F | Resp 18 | Ht 72.0 in | Wt 195.1 lb

## 2017-12-03 DIAGNOSIS — C3411 Malignant neoplasm of upper lobe, right bronchus or lung: Secondary | ICD-10-CM | POA: Insufficient documentation

## 2017-12-03 DIAGNOSIS — Z87891 Personal history of nicotine dependence: Secondary | ICD-10-CM | POA: Diagnosis not present

## 2017-12-03 DIAGNOSIS — J90 Pleural effusion, not elsewhere classified: Secondary | ICD-10-CM | POA: Diagnosis not present

## 2017-12-03 DIAGNOSIS — R918 Other nonspecific abnormal finding of lung field: Secondary | ICD-10-CM | POA: Diagnosis not present

## 2017-12-03 DIAGNOSIS — N289 Disorder of kidney and ureter, unspecified: Secondary | ICD-10-CM | POA: Insufficient documentation

## 2017-12-03 DIAGNOSIS — R590 Localized enlarged lymph nodes: Secondary | ICD-10-CM | POA: Insufficient documentation

## 2017-12-03 DIAGNOSIS — I700 Atherosclerosis of aorta: Secondary | ICD-10-CM | POA: Diagnosis not present

## 2017-12-03 MED ORDER — LEVOFLOXACIN 500 MG PO TABS: 500.0000 mg | ORAL_TABLET | Freq: Every day | ORAL | 0 refills | Status: DC

## 2017-12-03 NOTE — Addendum Note (Signed)
Addended by: Sherril Croon on: 12/03/2017 03:50 PM   Modules accepted: Orders

## 2017-12-03 NOTE — Telephone Encounter (Signed)
Scheduled appt per 11/7 los - Gave patient AVS and calender per los.  

## 2017-12-03 NOTE — Progress Notes (Signed)
  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  Ruben reports feeling well.  Ruben has noted an increased cough for the past month.  Ruben clears his throat frequently.  No hemoptysis.  No fever.  Ruben does not feel like eating at times.  Objective:  Vital signs in last 24 hours:  Blood pressure 133/69, pulse 81, temperature 97.8 F (36.6 C), temperature source Oral, resp. rate 18, height 6' (1.829 m), weight 195 lb 1.6 oz (88.5 kg), SpO2 97 %.    HEENT: Neck without mass Lymphatics: No cervical, supraclavicular, or axillary nodes Resp: Rhonchi and wheezing throughout the right chest, no respiratory distress Cardio: Regular rate and rhythm GI: No hepatomegaly, nontender Vascular: No leg edema     Lab Results:  Lab Results  Component Value Date   WBC 6.0 07/23/2017   HGB 11.7 (L) 07/23/2017   HCT 32.8 (L) 07/23/2017   MCV 85 07/23/2017   PLT 219 07/23/2017   NEUTROABS 4.8 07/23/2017    CMP     Component Value Date/Time   NA 142 07/23/2017 0925   K 4.3 07/23/2017 0925   CL 106 07/23/2017 0925   CO2 19 (L) 07/23/2017 0925   GLUCOSE 74 07/23/2017 0925   GLUCOSE 107 (H) 04/08/2017 0905   BUN 41 (H) 07/23/2017 0925   CREATININE 2.45 (H) 07/23/2017 0925   CALCIUM 9.0 07/23/2017 0925   PROT 6.9 07/23/2017 0925   ALBUMIN 3.9 07/23/2017 0925   AST 12 07/23/2017 0925   ALT 6 07/23/2017 0925   ALKPHOS 86 07/23/2017 0925   BILITOT 0.3 07/23/2017 0925   GFRNONAA 23 (L) 07/23/2017 0925   GFRAA 27 (L) 07/23/2017 0925     Imaging:  CT chest 12/03/2017-images reviewed with Dr. Gilford Rile and his son Medications: I have reviewed the patient's current medications.   Assessment/Plan: 1. Right hilar mass, mediastinal adenopathy ? CT chest 02/26/2017 with a right hilar/suprahilar mass and enlargement of several lymph nodes ? PET scan Nov 13, 202018-hypermetabolic medial right upper lobe mass, hypermetabolic  mediastinal lymph nodes, hypermetabolic left parotid nodule ? Bronchoscopy 03/23/2017-normal airways, EBUS biopsy of a right paratracheal node in the right upper lung with bronchial brushings/washings returned nondiagnostic ? Bronchoscopy/EBUSand mediastinoscopy 04/08/2017-multiple cytologies and lymph node biopsies revealed no malignancy  Radiation to the chest 04/22/2017-05/05/2017, 30Gy in 10 fractions, additional 24Gyin 8 fractions completed 06/17/2017  CT chest 08/04/2017- decrease in right hilar mass and mediastinal lymphadenopathy 2. Renal insufficiency 3. 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  I reviewed images from today's chest CT with Dr. Gilford Rile and his son.  The final report is pending.  The right hilar masslike fullness appears to have progressed.  Ruben has a new right pleural effusion and consolidation in the right upper lung.  I suspect his symptoms and the imaging findings are related to progression of lung cancer.  Ruben will complete a course of Levaquin for possible postobstructive pneumonia.  I will contact Dr. Servando Snare to discuss a repeat bronchoscopy to obtain diagnostic tissue.  Dr. Gilford Rile will return for an office visit in 2 weeks.  25 minutes were spent with the patient today.  The majority of the time was used for counseling and coordination of care.  Betsy Coder, MD  12/03/2017  12:35 PM

## 2017-12-04 ENCOUNTER — Telehealth: Payer: Self-pay

## 2017-12-04 NOTE — Telephone Encounter (Addendum)
Called to inform pt of message below. Pt voiced understanding. Requests copy of CT be mailed to his address. Voiced understanding.     ----- Message from Ladell Pier, MD sent at 12/03/2017  7:32 PM EST ----- Please call patient, final reading of chest CT confirms enlargement of chest lymph nodes and the right hilar mass, new small right pleural effusion, consolidation changes in the lung may represent pneumonia or radiation change. Complete the course of Levaquin as prescribed.  I forwarded the report to Dr. Servando Snare and await his recommendation for a biopsy

## 2017-12-04 NOTE — Telephone Encounter (Deleted)
Called to inform pt of message below. Pt voiced understanding. Requests copy of CT be mailed to his address. Voiced understanding.

## 2017-12-07 ENCOUNTER — Other Ambulatory Visit: Payer: Self-pay

## 2017-12-07 ENCOUNTER — Encounter (HOSPITAL_COMMUNITY): Payer: Self-pay | Admitting: *Deleted

## 2017-12-07 ENCOUNTER — Other Ambulatory Visit: Payer: Self-pay | Admitting: *Deleted

## 2017-12-07 DIAGNOSIS — R918 Other nonspecific abnormal finding of lung field: Secondary | ICD-10-CM

## 2017-12-07 NOTE — Progress Notes (Signed)
Spoke with pt for pre-op call. Pt states only cardiac history he has is an irregular heart rate. States he sees Dr. Sallyanne Kuster once a year and states it's not giving him any problems. Pt is a type 2 diabetic. Last A1C was 5.9 on 07/23/17. Pt states he does not check his blood sugar at home.

## 2017-12-07 NOTE — Anesthesia Preprocedure Evaluation (Addendum)
Anesthesia Evaluation  Patient identified by MRN, date of birth, ID band Patient awake    Reviewed: Allergy & Precautions, NPO status , Patient's Chart, lab work & pertinent test results  Airway Mallampati: I       Dental  (+) Lower Dentures, Upper Dentures   Pulmonary former smoker,    Pulmonary exam normal        Cardiovascular hypertension, Pt. on medications Normal cardiovascular exam Rhythm:Regular Rate:Normal     Neuro/Psych negative psych ROS   GI/Hepatic negative GI ROS, Neg liver ROS,   Endo/Other  diabetes  Renal/GU   negative genitourinary   Musculoskeletal negative musculoskeletal ROS (+)   Abdominal Normal abdominal exam  (+)   Peds  Hematology   Anesthesia Other Findings   Reproductive/Obstetrics                            Anesthesia Physical Anesthesia Plan  ASA: III  Anesthesia Plan: General   Post-op Pain Management:    Induction:   PONV Risk Score and Plan: 2 and Ondansetron and Treatment may vary due to age or medical condition  Airway Management Planned: Oral ETT  Additional Equipment:   Intra-op Plan:   Post-operative Plan: Extubation in OR  Informed Consent: I have reviewed the patients History and Physical, chart, labs and discussed the procedure including the risks, benefits and alternatives for the proposed anesthesia with the patient or authorized representative who has indicated his/her understanding and acceptance.   Dental advisory given  Plan Discussed with: CRNA and Surgeon  Anesthesia Plan Comments:        Anesthesia Quick Evaluation

## 2017-12-08 ENCOUNTER — Ambulatory Visit (HOSPITAL_COMMUNITY): Payer: PPO

## 2017-12-08 ENCOUNTER — Ambulatory Visit (HOSPITAL_COMMUNITY)
Admission: RE | Admit: 2017-12-08 | Discharge: 2017-12-08 | Disposition: A | Discharge status: Home / Self Care | Payer: PPO | Source: Ambulatory Visit | Attending: Cardiothoracic Surgery | Admitting: Cardiothoracic Surgery

## 2017-12-08 ENCOUNTER — Ambulatory Visit (HOSPITAL_COMMUNITY): Payer: PPO | Admitting: Anesthesiology

## 2017-12-08 ENCOUNTER — Encounter (HOSPITAL_COMMUNITY): Payer: Self-pay | Admitting: General Practice

## 2017-12-08 ENCOUNTER — Encounter (HOSPITAL_COMMUNITY)
Admission: RE | Disposition: A | Discharge status: Home / Self Care | Payer: Self-pay | Source: Ambulatory Visit | Attending: Cardiothoracic Surgery

## 2017-12-08 DIAGNOSIS — N433 Hydrocele, unspecified: Secondary | ICD-10-CM | POA: Diagnosis not present

## 2017-12-08 DIAGNOSIS — Z8249 Family history of ischemic heart disease and other diseases of the circulatory system: Secondary | ICD-10-CM | POA: Insufficient documentation

## 2017-12-08 DIAGNOSIS — R846 Abnormal cytological findings in specimens from respiratory organs and thorax: Secondary | ICD-10-CM | POA: Diagnosis not present

## 2017-12-08 DIAGNOSIS — E1122 Type 2 diabetes mellitus with diabetic chronic kidney disease: Secondary | ICD-10-CM | POA: Insufficient documentation

## 2017-12-08 DIAGNOSIS — N400 Enlarged prostate without lower urinary tract symptoms: Secondary | ICD-10-CM | POA: Diagnosis not present

## 2017-12-08 DIAGNOSIS — Z87891 Personal history of nicotine dependence: Secondary | ICD-10-CM | POA: Diagnosis not present

## 2017-12-08 DIAGNOSIS — J439 Emphysema, unspecified: Secondary | ICD-10-CM | POA: Diagnosis not present

## 2017-12-08 DIAGNOSIS — I700 Atherosclerosis of aorta: Secondary | ICD-10-CM | POA: Insufficient documentation

## 2017-12-08 DIAGNOSIS — N184 Chronic kidney disease, stage 4 (severe): Secondary | ICD-10-CM | POA: Insufficient documentation

## 2017-12-08 DIAGNOSIS — I129 Hypertensive chronic kidney disease with stage 1 through stage 4 chronic kidney disease, or unspecified chronic kidney disease: Secondary | ICD-10-CM | POA: Insufficient documentation

## 2017-12-08 DIAGNOSIS — K8020 Calculus of gallbladder without cholecystitis without obstruction: Secondary | ICD-10-CM | POA: Insufficient documentation

## 2017-12-08 DIAGNOSIS — K4090 Unilateral inguinal hernia, without obstruction or gangrene, not specified as recurrent: Secondary | ICD-10-CM | POA: Diagnosis not present

## 2017-12-08 DIAGNOSIS — Z923 Personal history of irradiation: Secondary | ICD-10-CM | POA: Insufficient documentation

## 2017-12-08 DIAGNOSIS — D631 Anemia in chronic kidney disease: Secondary | ICD-10-CM | POA: Diagnosis not present

## 2017-12-08 DIAGNOSIS — C3491 Malignant neoplasm of unspecified part of right bronchus or lung: Secondary | ICD-10-CM | POA: Diagnosis not present

## 2017-12-08 DIAGNOSIS — R918 Other nonspecific abnormal finding of lung field: Secondary | ICD-10-CM

## 2017-12-08 DIAGNOSIS — C3411 Malignant neoplasm of upper lobe, right bronchus or lung: Secondary | ICD-10-CM | POA: Diagnosis not present

## 2017-12-08 DIAGNOSIS — I499 Cardiac arrhythmia, unspecified: Secondary | ICD-10-CM | POA: Insufficient documentation

## 2017-12-08 DIAGNOSIS — Z01818 Encounter for other preprocedural examination: Secondary | ICD-10-CM

## 2017-12-08 DIAGNOSIS — Z09 Encounter for follow-up examination after completed treatment for conditions other than malignant neoplasm: Secondary | ICD-10-CM

## 2017-12-08 DIAGNOSIS — R896 Abnormal cytological findings in specimens from other organs, systems and tissues: Secondary | ICD-10-CM | POA: Diagnosis not present

## 2017-12-08 DIAGNOSIS — J984 Other disorders of lung: Secondary | ICD-10-CM | POA: Diagnosis not present

## 2017-12-08 DIAGNOSIS — E785 Hyperlipidemia, unspecified: Secondary | ICD-10-CM | POA: Diagnosis not present

## 2017-12-08 DIAGNOSIS — Z8042 Family history of malignant neoplasm of prostate: Secondary | ICD-10-CM | POA: Insufficient documentation

## 2017-12-08 DIAGNOSIS — C771 Secondary and unspecified malignant neoplasm of intrathoracic lymph nodes: Secondary | ICD-10-CM | POA: Diagnosis not present

## 2017-12-08 DIAGNOSIS — Z841 Family history of disorders of kidney and ureter: Secondary | ICD-10-CM | POA: Diagnosis not present

## 2017-12-08 HISTORY — DX: Malignant (primary) neoplasm, unspecified: C80.1

## 2017-12-08 HISTORY — DX: Pneumonia, unspecified organism: J18.9

## 2017-12-08 HISTORY — PX: VIDEO BRONCHOSCOPY WITH ENDOBRONCHIAL ULTRASOUND: SHX6177

## 2017-12-08 LAB — CBC
HCT: 32.6 % — ABNORMAL LOW (ref 39.0–52.0)
Hemoglobin: 11.1 g/dL — ABNORMAL LOW (ref 13.0–17.0)
MCH: 29.1 pg (ref 26.0–34.0)
MCHC: 34 g/dL (ref 30.0–36.0)
MCV: 85.3 fL (ref 78.0–100.0)
Platelets: 209 10*3/uL (ref 150–400)
RBC: 3.82 MIL/uL — ABNORMAL LOW (ref 4.22–5.81)
RDW: 13.7 % (ref 11.5–15.5)
WBC: 6.4 10*3/uL (ref 4.0–10.5)

## 2017-12-08 LAB — COMPREHENSIVE METABOLIC PANEL
ALT: 11 U/L — ABNORMAL LOW (ref 17–63)
AST: 15 U/L (ref 15–41)
Albumin: 3.3 g/dL — ABNORMAL LOW (ref 3.5–5.0)
Alkaline Phosphatase: 76 U/L (ref 38–126)
Anion gap: 15 (ref 5–15)
BUN: 54 mg/dL — ABNORMAL HIGH (ref 6–20)
CO2: 17 mmol/L — ABNORMAL LOW (ref 22–32)
Calcium: 8.7 mg/dL — ABNORMAL LOW (ref 8.9–10.3)
Chloride: 103 mmol/L (ref 101–111)
Creatinine, Ser: 2.7 mg/dL — ABNORMAL HIGH (ref 0.61–1.24)
GFR calc Af Amer: 23 mL/min — ABNORMAL LOW (ref >60)
GFR calc non Af Amer: 20 mL/min — ABNORMAL LOW (ref >60)
Glucose, Bld: 69 mg/dL (ref 65–99)
Potassium: 3.5 mmol/L (ref 3.5–5.1)
Sodium: 135 mmol/L (ref 135–145)
Total Bilirubin: 0.7 mg/dL (ref 0.3–1.2)
Total Protein: 6.3 g/dL — ABNORMAL LOW (ref 6.5–8.1)

## 2017-12-08 LAB — GLUCOSE, CAPILLARY
Glucose-Capillary: 81 mg/dL (ref 65–99)
Glucose-Capillary: 137 mg/dL — ABNORMAL HIGH (ref 65–99)

## 2017-12-08 LAB — PROTIME-INR
INR: 1.09
Prothrombin Time: 14 seconds (ref 11.4–15.2)

## 2017-12-08 LAB — APTT: aPTT: 29 seconds (ref 24–36)

## 2017-12-08 LAB — HEMOGLOBIN A1C
Hgb A1c MFr Bld: 5.7 % — ABNORMAL HIGH (ref 4.8–5.6)
Mean Plasma Glucose: 116.89 mg/dL

## 2017-12-08 SURGERY — BRONCHOSCOPY, WITH EBUS
Anesthesia: General

## 2017-12-08 MED ORDER — PHENYLEPHRINE 40 MCG/ML (10ML) SYRINGE FOR IV PUSH (FOR BLOOD PRESSURE SUPPORT)
PREFILLED_SYRINGE | INTRAVENOUS | Status: AC
  Filled 2017-12-08: qty 20

## 2017-12-08 MED ORDER — ROCURONIUM BROMIDE 10 MG/ML (PF) SYRINGE
PREFILLED_SYRINGE | INTRAVENOUS | Status: AC
  Filled 2017-12-08: qty 15

## 2017-12-08 MED ORDER — ROCURONIUM BROMIDE 100 MG/10ML IV SOLN
INTRAVENOUS | Status: DC | PRN
  Administered 2017-12-08: 40 mg via INTRAVENOUS
  Administered 2017-12-08: 20 mg via INTRAVENOUS
  Administered 2017-12-08 (×2): 10 mg via INTRAVENOUS

## 2017-12-08 MED ORDER — PROPOFOL 10 MG/ML IV BOLUS
INTRAVENOUS | Status: AC
  Filled 2017-12-08: qty 20

## 2017-12-08 MED ORDER — ONDANSETRON HCL 4 MG/2ML IJ SOLN
INTRAMUSCULAR | Status: AC
  Filled 2017-12-08: qty 2

## 2017-12-08 MED ORDER — FENTANYL CITRATE (PF) 100 MCG/2ML IJ SOLN: 25.0000 ug | INTRAMUSCULAR | Status: DC | PRN

## 2017-12-08 MED ORDER — ONDANSETRON HCL 4 MG/2ML IJ SOLN
INTRAMUSCULAR | Status: DC | PRN
  Administered 2017-12-08: 4 mg via INTRAVENOUS

## 2017-12-08 MED ORDER — SUGAMMADEX SODIUM 200 MG/2ML IV SOLN
INTRAVENOUS | Status: DC | PRN
  Administered 2017-12-08: 200 mg via INTRAVENOUS

## 2017-12-08 MED ORDER — ACETAMINOPHEN 325 MG PO TABS: 325.0000 mg | ORAL_TABLET | ORAL | Status: DC | PRN

## 2017-12-08 MED ORDER — DEXAMETHASONE SODIUM PHOSPHATE 10 MG/ML IJ SOLN
INTRAMUSCULAR | Status: DC | PRN
  Administered 2017-12-08: 5 mg via INTRAVENOUS

## 2017-12-08 MED ORDER — EPHEDRINE SULFATE 50 MG/ML IJ SOLN
INTRAMUSCULAR | Status: DC | PRN
  Administered 2017-12-08: 15 mg via INTRAVENOUS
  Administered 2017-12-08: 5 mg via INTRAVENOUS

## 2017-12-08 MED ORDER — LIDOCAINE HCL (CARDIAC) 20 MG/ML IV SOLN
INTRAVENOUS | Status: DC | PRN
  Administered 2017-12-08: 80 mg via INTRAVENOUS

## 2017-12-08 MED ORDER — OXYCODONE HCL 5 MG PO TABS: 5.0000 mg | ORAL_TABLET | Freq: Once | ORAL | Status: DC | PRN

## 2017-12-08 MED ORDER — LACTATED RINGERS IV SOLN
INTRAVENOUS | Status: DC | PRN
  Administered 2017-12-08: 07:00:00 via INTRAVENOUS

## 2017-12-08 MED ORDER — OXYCODONE HCL 5 MG/5ML PO SOLN: 5.0000 mg | Freq: Once | ORAL | Status: DC | PRN

## 2017-12-08 MED ORDER — SUGAMMADEX SODIUM 200 MG/2ML IV SOLN
INTRAVENOUS | Status: AC
  Filled 2017-12-08: qty 2

## 2017-12-08 MED ORDER — SODIUM CHLORIDE 0.9 % IR SOLN
Status: DC | PRN
  Administered 2017-12-08: 08:00:00

## 2017-12-08 MED ORDER — FENTANYL CITRATE (PF) 100 MCG/2ML IJ SOLN
INTRAMUSCULAR | Status: DC | PRN
  Administered 2017-12-08: 50 ug via INTRAVENOUS
  Administered 2017-12-08: 100 ug via INTRAVENOUS
  Administered 2017-12-08: 50 ug via INTRAVENOUS

## 2017-12-08 MED ORDER — EPINEPHRINE PF 1 MG/ML IJ SOLN
INTRAMUSCULAR | Status: AC
  Filled 2017-12-08: qty 1

## 2017-12-08 MED ORDER — ACETAMINOPHEN 160 MG/5ML PO SOLN: 325.0000 mg | ORAL | Status: DC | PRN

## 2017-12-08 MED ORDER — ONDANSETRON HCL 4 MG/2ML IJ SOLN: 4.0000 mg | Freq: Once | INTRAMUSCULAR | Status: DC | PRN

## 2017-12-08 MED ORDER — PHENYLEPHRINE HCL 10 MG/ML IJ SOLN
INTRAVENOUS | Status: DC | PRN
  Administered 2017-12-08: 30 ug/min via INTRAVENOUS

## 2017-12-08 MED ORDER — FENTANYL CITRATE (PF) 250 MCG/5ML IJ SOLN
INTRAMUSCULAR | Status: AC
  Filled 2017-12-08: qty 5

## 2017-12-08 MED ORDER — ACETAMINOPHEN 10 MG/ML IV SOLN: 1000.0000 mg | Freq: Once | INTRAVENOUS | Status: DC | PRN

## 2017-12-08 MED ORDER — PROPOFOL 10 MG/ML IV BOLUS
INTRAVENOUS | Status: DC | PRN
  Administered 2017-12-08: 120 mg via INTRAVENOUS

## 2017-12-08 MED ORDER — LIDOCAINE 2% (20 MG/ML) 5 ML SYRINGE
INTRAMUSCULAR | Status: AC
  Filled 2017-12-08: qty 5

## 2017-12-08 MED ORDER — EPHEDRINE 5 MG/ML INJ
INTRAVENOUS | Status: AC
  Filled 2017-12-08: qty 10

## 2017-12-08 MED ORDER — DEXAMETHASONE SODIUM PHOSPHATE 10 MG/ML IJ SOLN
INTRAMUSCULAR | Status: AC
  Filled 2017-12-08: qty 1

## 2017-12-08 MED ORDER — DEXTROSE 5 % IV SOLN
1.5000 g | Freq: Once | INTRAVENOUS | Status: AC
  Administered 2017-12-08: 1.5 g via INTRAVENOUS
  Filled 2017-12-08: qty 1.5

## 2017-12-08 MED ORDER — MEPERIDINE HCL 25 MG/ML IJ SOLN: 6.2500 mg | INTRAMUSCULAR | Status: DC | PRN

## 2017-12-08 SURGICAL SUPPLY — 30 items
BALLN ULTRASOUND EBUS (BALLOONS) ×4
BALLOON ULTRASOUND EBUS (BALLOONS) IMPLANT
BRUSH CYTOL CELLEBRITY 1.5X140 (MISCELLANEOUS) IMPLANT
CANISTER SUCT 3000ML PPV (MISCELLANEOUS) ×2 IMPLANT
CONT SPEC 4OZ CLIKSEAL STRL BL (MISCELLANEOUS) ×2 IMPLANT
COVER BACK TABLE 60X90IN (DRAPES) ×2 IMPLANT
COVER DOME SNAP 22 D (MISCELLANEOUS) ×2 IMPLANT
FORCEPS BIOP RJ4 1.8 (CUTTING FORCEPS) IMPLANT
FORCEPS RADIAL JAW LRG 4 PULM (INSTRUMENTS) IMPLANT
GAUZE SPONGE 4X4 12PLY STRL (GAUZE/BANDAGES/DRESSINGS) ×3 IMPLANT
GLOVE BIO SURGEON STRL SZ 6.5 (GLOVE) ×2 IMPLANT
KIT CLEAN ENDO COMPLIANCE (KITS) ×5 IMPLANT
KIT ROOM TURNOVER OR (KITS) ×2 IMPLANT
MARKER SKIN DUAL TIP RULER LAB (MISCELLANEOUS) ×2 IMPLANT
NDL EBUS SONO TIP PENTAX (NEEDLE) ×1 IMPLANT
NDL FILTER BLUNT 18X1 1/2 (NEEDLE) IMPLANT
NEEDLE EBUS SONO TIP PENTAX (NEEDLE) ×2 IMPLANT
NEEDLE FILTER BLUNT 18X 1/2SAF (NEEDLE)
NEEDLE FILTER BLUNT 18X1 1/2 (NEEDLE) IMPLANT
NS IRRIG 1000ML POUR BTL (IV SOLUTION) ×2 IMPLANT
OIL SILICONE PENTAX (PARTS (SERVICE/REPAIRS)) ×2 IMPLANT
PAD ARMBOARD 7.5X6 YLW CONV (MISCELLANEOUS) ×4 IMPLANT
RADIAL JAW LRG 4 PULMONARY (INSTRUMENTS) ×1
SYR 20CC LL (SYRINGE) ×2 IMPLANT
SYR 20ML ECCENTRIC (SYRINGE) ×2 IMPLANT
TOWEL GREEN STERILE (TOWEL DISPOSABLE) ×2 IMPLANT
TOWEL GREEN STERILE FF (TOWEL DISPOSABLE) ×2 IMPLANT
TRAP SPECIMEN MUCOUS 40CC (MISCELLANEOUS) ×2 IMPLANT
TUBE CONNECTING 20X1/4 (TUBING) ×3 IMPLANT
WATER STERILE IRR 1000ML POUR (IV SOLUTION) ×2 IMPLANT

## 2017-12-08 NOTE — Discharge Instructions (Signed)
Flexible Bronchoscopy Bronchoscopy is a procedure used to examine the passageways in the lungs. During the procedure a thin, flexible tool with a lens and camera or eyepiece is passed in your mouth or nose, down the windpipe (trachea), and into the air tubes (bronchi). This tool allows your health care provider to carefully look at your lungs from the inside and take diagnostic samples if needed. Tell a health care provider about:  Allergies to food or medicine.  All medicines you are taking, including blood thinners, vitamins, herbs, eye drops, creams, and over-the-counter medicines.  Any problems you or family members have had with anesthetic medicines.  Any blood disorders you have.  Any surgeries you have had.  Medical conditions you have, including heart disease, diabetes, or kidney problems.  Possibility of pregnancy, if this applies. What are the risks? Generally, this is a safe procedure. However, as with any procedure, problems can occur. Possible problems include:  Collapsed lung (pneumothorax).  Bleeding.  Increased need for oxygen or difficulty breathing after the procedure.  What happens before the procedure? Do not eat or drink anything after midnight on the night before the procedure or as directed by your health care provider. What happens during the procedure?  Relax as much as possible during the procedure.  Medicines may be given to relax you, dry up your secretions, and control coughing.  A numbing medicine (local anesthetic) will be given to numb your mouth, nose, throat, and voice box (larynx). You will be able to breathe normally during the procedure.  Samples of airway secretions may be collected for testing.  If abnormal areas are seen in your airways, tissue samples may be taken for examination under a microscope (biopsy).  If tissue samples are needed from the outer portions of the lung, a type of X-ray called fluoroscopy may be done.  If bleeding  occurs, a drug may be used to stop or decrease the bleeding. What happens after the procedure?  You may receive a chest X-ray following the procedure. This is to make sure the lungs have not collapsed (pneumothorax). This information is not intended to replace advice given to you by your health care provider. Make sure you discuss any questions you have with your health care provider. Document Released: 10/31/2000 Document Revised: 04/10/2016 Document Reviewed: 07/08/2013 Elsevier Interactive Patient Education  2017 [employer].

## 2017-12-08 NOTE — Transfer of Care (Signed)
Immediate Anesthesia Transfer of Care Note  Patient: Ruben Conner  Procedure(s) Performed: VIDEO BRONCHOSCOPY with BRONCHIAL BIOPSIES AND ENDOBRONCHIAL ULTRASOUND WITH TRANSBRONCHIAL AND BRONCHIAL BIOPSIES (N/A )  Patient Location: PACU  Anesthesia Type:General  Level of Consciousness: awake, alert  and patient cooperative  Airway & Oxygen Therapy: Patient Spontanous Breathing  Post-op Assessment: Report given to RN and Post -op Vital signs reviewed and stable  Post vital signs: Reviewed and stable  Last Vitals:  Vitals:   12/08/17 0557 12/08/17 0955  BP: 118/84   Pulse: 67   Resp: 18   Temp: 36.6 C (P) 36.7 C  SpO2: 97%     Last Pain:  Vitals:   12/08/17 0557  TempSrc: Oral      Patients Stated Pain Goal: 3 (75/64/33 2951)  Complications: No apparent anesthesia complications

## 2017-12-08 NOTE — Brief Op Note (Addendum)
12/08/2017  10:21 AM  PATIENT:  [name]  84 y.o. male  PRE-OPERATIVE DIAGNOSIS:  RIGHT LUNG MASS  POST-OPERATIVE DIAGNOSIS:  RIGHT LUNG MASS- final path pending  PROCEDURE:  Procedure(s): VIDEO BRONCHOSCOPY with BRONCHIAL BIOPSIES AND ENDOBRONCHIAL ULTRASOUND WITH TRANSBRONCHIAL AND BRONCHIAL BIOPSIES (N/A)  SURGEON:  Surgeon(s) and Role:    * Grace Isaac, MD - Primary   ANESTHESIA:   general  EBL:  10 mL   BLOOD ADMINISTERED:none  DRAINS: none   LOCAL MEDICATIONS USED:  NONE  SPECIMEN:  Source of Specimen:  right upper lobe , #7, # 10R, # 11R  DISPOSITION OF SPECIMEN:  PATHOLOGY  COUNTS:  YES   DICTATION: .Dragon Dictation  PLAN OF CARE: Discharge to home after PACU  PATIENT DISPOSITION:  PACU - hemodynamically stable.   Delay start of Pharmacological VTE agent (>24hrs) due to surgical blood loss or risk of bleeding: yes

## 2017-12-08 NOTE — Anesthesia Procedure Notes (Signed)
Procedure Name: Intubation Date/Time: 12/08/2017 7:40 AM Performed by: White, Amedeo Plenty, CRNA Pre-anesthesia Checklist: Patient identified, Emergency Drugs available, Suction available and Patient being monitored Patient Re-evaluated:Patient Re-evaluated prior to induction Oxygen Delivery Method: Circle System Utilized Preoxygenation: Pre-oxygenation with 100% oxygen Induction Type: IV induction Ventilation: Mask ventilation without difficulty Laryngoscope Size: Mac and 4 Grade View: Grade III Tube type: Oral Tube size: 8.5 mm Number of attempts: 1 Airway Equipment and Method: Stylet Placement Confirmation: ETT inserted through vocal cords under direct vision,  positive ETCO2 and breath sounds checked- equal and bilateral Secured at: 23 cm Tube secured with: Tape Dental Injury: Teeth and Oropharynx as per pre-operative assessment

## 2017-12-08 NOTE — Anesthesia Postprocedure Evaluation (Signed)
Anesthesia Post Note  Patient: Ruben Conner  Procedure(s) Performed: VIDEO BRONCHOSCOPY with BRONCHIAL BIOPSIES AND ENDOBRONCHIAL ULTRASOUND WITH TRANSBRONCHIAL AND BRONCHIAL BIOPSIES (N/A )     Patient location during evaluation: PACU Anesthesia Type: General Level of consciousness: awake Pain management: pain level controlled Vital Signs Assessment: post-procedure vital signs reviewed and stable Respiratory status: spontaneous breathing Cardiovascular status: stable Postop Assessment: no apparent nausea or vomiting Anesthetic complications: no    Last Vitals:  Vitals:   12/08/17 1010 12/08/17 1026  BP: 122/60 105/60  Pulse: 95 94  Resp: 16 18  Temp:  36.7 C  SpO2: 97% 93%    Last Pain:  Vitals:   12/08/17 1026  TempSrc:   PainSc: 0-No pain   Pain Goal: Patients Stated Pain Goal: 3 (12/08/17 0703)               Charlea Nardo JR,JOHN Mateo Flow

## 2017-12-08 NOTE — H&P (Signed)
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 The patient had undergone bronchoscopy and endobronchial ultrasound with biopsy of the 10 R and 4R lymph nodes on 03/23/2017 by Dr. Elsworth Soho, no diagnostic material was obtained. Patient has now been referred to thoracic surgery for repeat biopsies.  Repeat biopsies were also not definitive, but the patient has undergone radiation treatment to the mediastinum upper lobe on the right- Radiation to the chest 04/22/2017-05/05/2017, 30Gy in 10 fractions, additional 24Gyin 8 fractions completed 06/17/2017  With recent symptoms of increasing cough and wheezing CT scan was performed suggest progression of disease.  Patient was referred by Dr. Benay Spice to consider repeat bronchoscopy ebus for evaluation of the current changes.     He is a long-term smoker stopping in December 2017 or January 2018. He noted feeling sick most of the month of February 2018, with cough and congestion no hemoptysis. This led to a chest x-ray in March and a new right hilar mass was noted. Since he is had a CT of the chest PET scan of the chest and attempted biopsy but without any diagnostic material.  He has a history of hypertension diabetes hyperlipidemia and chronic kidney disease, is followed by Dr. Joelyn Oms  Current Activity/ Functional Status:  Patient is independent with mobility/ambulation, transfers, ADL's, IADL's.   Zubrod Score: At the time of surgery this patient's most appropriate activity status/level should be described as: []     0    Normal activity, no symptoms [x]      1    Restricted in physical strenuous activity but ambulatory, able to do out light work []     2    Ambulatory and capable of self care, unable to do work activities, up and about               >50 % of waking hours                              []     3    Only limited self care, in bed greater than 50% of waking hours []     4    Completely disabled, no self care, confined to bed or chair []     5    Moribund   Past Medical History:  Diagnosis Date  . Cancer ([place])    lung  . Diabetes mellitus without complication ([place])   . Hyperlipidemia   . Hypertension   . Irregular heart rate    Dr. Victorino December note from 04/2017 states bradycardia, LBBB  . Pneumonia   . Renal insufficiency     Past Surgical History:  Procedure Laterality Date  . cyst removed    . ENDOBRONCHIAL ULTRASOUND Bilateral 03/23/2017   Procedure: ENDOBRONCHIAL ULTRASOUND;  Surgeon: Rigoberto Noel, MD;  Location: [facility];  Service: Cardiopulmonary;  Laterality:  Bilateral;  . EYE SURGERY    . MEDIASTINOSCOPY N/A 04/08/2017   Procedure: MEDIASTINOSCOPY;  Surgeon: Grace Isaac, MD;  Location: [facility];  Service: Thoracic;  Laterality: N/A;  . MOUTH SURGERY    . ROTATOR CUFF REPAIR    . tumor remoced from neck    . VIDEO BRONCHOSCOPY WITH ENDOBRONCHIAL ULTRASOUND N/A 04/08/2017   Procedure: VIDEO BRONCHOSCOPY WITH ENDOBRONCHIAL ULTRASOUND;  Surgeon: Grace Isaac, MD;  Location: [facility];  Service: Thoracic;  Laterality: N/A;    Family History  Problem Relation Age of Onset  . Kidney disease Mother   . Heart disease Father   . Cancer Son        prostate  . Suicidality Son     Social History   Social History  . Marital status: Married    Spouse name: N/A  . Number of children: 1  . Years of education: N/A   Occupational History  . Retired Health and safety inspector    Social History Main Topics  . Smoking status: Former Smoker    Packs/day: 0.10    Types: Cigarettes    Quit date: 11/28/2016  . Smokeless tobacco: Never  Used  . Alcohol use No  . Drug use: No  . Sexual activity: Not on file      Social History   Tobacco Use  Smoking Status Former Smoker  . Packs/day: 0.10  . Years: 53.00  . Pack years: 5.30  . Types: Cigarettes  . Last attempt to quit: 11/28/2016  . Years since quitting: 1.0  Smokeless Tobacco Never Used    Social History   Substance and Sexual Activity  Alcohol Use No  . Alcohol/week: 0.0 oz     No Known Allergies  No current facility-administered medications for this encounter.       Review of Systems:     Cardiac Review of Systems: Y or N  Chest Pain [ n   ]  Resting SOB [  n ] Exertional SOB  [ y ]  Orthopnea [ n ]   Pedal Edema [ y  ]    Palpitations [  n] Syncope  [n ]   Presyncope [ n  ]  General Review of Systems: [Y] = yes [  ]=no Constitional: recent weight change [  ];  Wt loss over the last 3 months [   ] anorexia [  ]; fatigue [  ]; nausea [  ]; night sweats [  ]; fever [  ]; or chills [  ];          Dental: poor dentition[  ]; Last Dentist visit:   Eye : blurred vision [  ]; diplopia [   ]; vision changes [  ];  Amaurosis fugax[  ]; Resp: cough [name]  ];  wheezing[y  ];  hemoptysis[n  ]; shortness of breath[ y ]; paroxysmal nocturnal dyspnea[  ]; dyspnea on exertion[  ]; or orthopnea[  ];  GI:  gallstones[  ], vomiting[  ];  dysphagia[  ]; melena[  ];  hematochezia [  ]; heartburn[  ];   Hx of  Colonoscopy[  ]; GU: kidney stones [  ]; hematuria[  ];   dysuria [  ];  nocturia[  ];  history of     obstruction [  ]; urinary frequency [  ]             Skin: rash, swelling[  ];, hair loss[  ];  peripheral edema[  ];  or itching[  ]; Musculosketetal: myalgias[  ];  joint swelling[  ];  joint erythema[  ];  joint pain[  ];  back pain[  ];  Heme/Lymph: bruising[  ];  bleeding[  ];  anemia[  ];  Neuro: TIA[ n ];  headaches[  ];  stroke[n  ];  vertigo[  ];  seizures[n  ];   paresthesias[  ];  difficulty walking[n  ];  Psych:depression[  ]; anxiety[  ];  Endocrine:  diabetes[  ];  thyroid dysfunction[  ];  Immunizations: Flu up to date [  ]; Pneumococcal up to date [  ];  Other:  Physical Exam: BP 118/84   Pulse 67   Temp 97.8 F (36.6 C) (Oral)   Resp 18   Ht 6' (1.829 m)   Wt 195 lb 1.6 oz (88.5 kg)   SpO2 97%   BMI 26.46 kg/m   PHYSICAL EXAMINATION: General appearance: alert and cooperative Head: Normocephalic, without obvious abnormality, atraumatic Neck: no adenopathy, no carotid bruit, no JVD, supple, symmetrical, trachea midline and thyroid not enlarged, symmetric, no tenderness/mass/nodules Lymph nodes: Cervical, supraclavicular, and axillary nodes normal. and On exam I cannot palpate any parotid mass on either side Resp: diminished breath sounds bibasilar Back: symmetric, no curvature. ROM normal. No CVA tenderness. Cardio: regular rate and rhythm, S1, S2 normal, no murmur, click, rub or gallop GI: soft, non-tender; bowel sounds normal; no masses,  no organomegaly Extremities: extremities normal, atraumatic, no cyanosis or edema and Homans sign is negative, no sign of DVT Neurologic: Grossly normal  Diagnostic Studies & Laboratory data:     Recent Radiology Findings:  Dg Chest 2 View  Result Date: 12/08/2017 CLINICAL DATA:  Preoperative for endoscopy. History of right lung cancer. EXAM: CHEST  2 VIEW COMPARISON:  CT chest 12/03/2017.  Chest 04/08/2017. FINDINGS: Normal heart size and pulmonary vascularity. Masslike infiltration in the right suprahilar region corresponding to known lung cancer and probably representing component of radiation change or postobstructive pneumonia. Left lung is clear. Emphysematous changes in both lungs. No pleural effusions. No pneumothorax. Calcification of the aorta. IMPRESSION: Right suprahilar mass and infiltration corresponding to known lung cancer. Emphysematous changes in the lungs. No acute consolidation. Aortic atherosclerosis. Electronically Signed   By: Lucienne Capers M.D.   On: 12/08/2017  06:33  CLINICAL DATA:  Patient with history of right lung cancer. Follow-up evaluation.  EXAM: CT CHEST WITHOUT CONTRAST  TECHNIQUE: Multidetector CT imaging of the chest was performed following the standard protocol without IV contrast.  COMPARISON:  CT chest 08/04/2017.  FINDINGS: Cardiovascular: Normal heart size. Trace pericardial fluid. Thoracic aortic vascular calcifications. Coronary arterial vascular calcifications.  Mediastinum/Nodes: No axillary adenopathy. Interval increase in size of mediastinal lymph nodes including a 1.2 cm right paratracheal lymph node (image 46; series 2), previously 0.7 cm. Interval increase in size of superior mediastinal node measuring 1.2 cm (image 28; series 2), previously 0.9 cm. Interval increase in size of subcarinal lymph node measuring 1.9 cm (image 75; series 2), previously 1.3 cm.  Lungs/Pleura: Central airways are patent. Interval increase in size of right hilar mass measuring 5.2 x 3.4 cm (image 58; series 2), previously 3.9 x 2.8 cm. Interval development of irregular consolidation throughout the right upper, right middle and superior segment right lower lobes. New small right pleural effusion. No pneumothorax. Re demonstrated patchy airspace opacities within the lower lobes bilaterally. Interval development of irregular subpleural consolidation within the medial left upper lobe measuring up to 2.0 cm (image 71; series  5). No pneumothorax. Stable 4 mm nodule in the lingula (image 111; series 5).  Upper Abdomen: No acute process.  Musculoskeletal: Thoracic spine degenerative changes. No aggressive or acute appearing osseous lesions.  IMPRESSION: 1. Interval increase in size of right hilar mass. 2. Interval increase in mediastinal adenopathy. 3. Interval development of extensive consolidation throughout the right lung, predominantly within a perihilar location, potentially representing postradiation pneumonitis  and/or superimposed infection. Recommend attention on follow-up. 4. Subpleural consolidation within the medial left upper lobe may represent an infectious/inflammatory process. Recommend attention on follow-up as metastatic lesion not excluded. 5. Residual nodular opacities within the lower lobes bilaterally. 6.  Aortic Atherosclerosis (ICD10-I70.0).   Electronically Signed   By: Lovey Newcomer M.D.   On: 12/03/2017 14:09    Nm Pet Image Initial (pi) Skull Base To Thigh  Result Date: 03/20/2017 CLINICAL DATA:  Initial treatment strategy for right-sided lung mass. EXAM: NUCLEAR MEDICINE PET SKULL BASE TO THIGH TECHNIQUE: 10.0 mCi F-18 FDG was injected intravenously. Full-ring PET imaging was performed from the skull base to thigh after the radiotracer. CT data was obtained and used for attenuation correction and anatomic localization. FASTING BLOOD GLUCOSE:  Value: 107 mg/dl COMPARISON:  Chest CT 02/26/2017 FINDINGS: NECK Deep left parotid hypermetabolic nodule measures 6 mm and a S.U.V. max of 7.2 on image 19/series 4. No cervical nodal hypermetabolism.  No cervical adenopathy. CHEST Right suprahilar/ upper lobe lung mass with direct extension to or contiguous adenopathy within the right hilum. Involvement of the right-sided mediastinum. This measures 5.6 x 5.0 cm and a S.U.V. max of 8.2 on image 69/series 4. Prevascular node measures 10 mm and a S.U.V. max of 3.9 on image 65/series 4. Hypermetabolic middle mediastinal nodes, including a precarinal node which measures 1.3 cm and a S.U.V. max of 6.2 on image 73/series 4. Mild cardiomegaly with coronary artery atherosclerosis. Centrilobular emphysema. Other chest findings deferred to prior diagnostic CT. ABDOMEN/PELVIS No abdominopelvic nodal or parenchymal hypermetabolism. Mild renal cortical thinning bilaterally. Normal adrenal glands. Multiple small gallstones, without acute cholecystitis. Aortic and branch vessel atherosclerosis. Moderate  prostatomegaly. Left larger than right fat containing inguinal hernias. Large left and moderate right-sided hydrocele. SKELETON No abnormal marrow activity. No focal osseous lesion. Degenerative partial fusion of the bilateral sacroiliac joints. Lumbar facet arthropathy. IMPRESSION: 1. Primary bronchogenic neoplasm with epicenter in the central right upper lobe. Direct extension to versus contiguous adenopathy within the right hilum. Direct extension into the right side of the mediastinum. 2. Thoracic nodal metastasis. 3. Hypermetabolic left parotid nodule is suspicious for a benign or malignant primary neoplasm. 4.  Coronary artery atherosclerosis. Aortic atherosclerosis. 5. Cholelithiasis. Electronically Signed   By: Abigail Miyamoto M.D.   On: May 27, 202018 09:33   CLINICAL DATA:  Follow-up abnormal chest x-ray  EXAM: CT CHEST WITHOUT CONTRAST  TECHNIQUE: Multidetector CT imaging of the chest was performed following the standard protocol without IV contrast. Contrast could not be administered due to elevated creatinine  COMPARISON:  02/11/2017  FINDINGS: Cardiovascular: Somewhat limited due to lack of IV contrast. Aortic calcifications are seen without aneurysmal dilatation. Coronary calcifications are seen. No significant cardiac enlargement is noted.  Mediastinum/Nodes: The thoracic inlet is within normal limits. Scattered mediastinal lymph nodes are identified. The largest of these lies in the precarinal region measuring 13.5 mm in short axis. Emanating from the right hilum superiorly is a soft tissue mass which measures approximately 6.6 by 4.8 cm in greatest AP and transverse dimensions. It abuts the superior vena cava distally  and attenuates the right upper lobe bronchial tree somewhat.  Lungs/Pleura: Left lung is well aerated with mild atelectatic changes. Similar dependent changes are noted in the right lung. The previously described right hilar and suprahilar mass lesion  is identified. A small right lower lobe subpleural nodule is noted which measures 5 mm in greatest dimension. This is best seen on image number 98 of series 4. Partially calcified nodule is noted in the posterior right upper lobe best seen on image number 51. This is adjacent to the suprahilar mass lesion and made the a small extension with mild calcification. This may represent a small partially calcified granuloma.  Upper Abdomen: The adrenal glands are within normal limits. The remainder of the upper abdomen is unremarkable.  Musculoskeletal: Degenerative changes of the thoracic spine are seen. No destructive bony lesions are seen.  IMPRESSION: Changes consistent with right hilar and suprahilar mass lesion consistent with primary pulmonary neoplasm. Associated mediastinal adenopathy is noted Direct visualization and biopsy with bronchoscopy is recommended. PET-CT would be helpful in staging as well.  These results will be called to the ordering clinician or representative by the Radiologist Assistant, and communication documented in the PACS or zVision Dashboard.   Electronically Signed   By: Inez Catalina M.D.   On: 02/27/2017 08:20  I have independently reviewed the above radiology studies  and reviewed the findings with the patient.   Recent Lab Findings: Lab Results  Component Value Date   WBC 6.4 12/08/2017   HGB 11.1 (L) 12/08/2017   HCT 32.6 (L) 12/08/2017   PLT 209 12/08/2017   GLUCOSE 74 07/23/2017   CHOL 137 07/23/2017   TRIG 93 07/23/2017   HDL 36 (L) 07/23/2017   LDLCALC 82 07/23/2017   ALT 6 07/23/2017   AST 12 07/23/2017   NA 142 07/23/2017   K 4.3 07/23/2017   CL 106 07/23/2017   CREATININE 2.45 (H) 07/23/2017   BUN 41 (H) 07/23/2017   CO2 19 (L) 07/23/2017   INR 1.09 12/08/2017   HGBA1C 5.7 (H) 12/08/2017   Chronic Kidney Disease   Stage I     GFR >90  Stage II    GFR 60-89  Stage IIIA GFR 45-59  Stage IIIB GFR 30-44  Stage IV    GFR 15-29  Stage V    GFR  <15  Lab Results  Component Value Date   CREATININE 2.45 (H) 07/23/2017   CrCl cannot be calculated (Patient's most recent lab result is older than the maximum 21 days allowed.). Est gfr 28 -30     Assessment / Plan:    Right upper lobe mass consistent with malignancy with  Invasion of mediastinum and PET positive #7 lymph node, treated Radiation to the chest 04/22/2017-05/05/2017, 30Gy in 10 fractions, additional 24Gyin 8 fractions completed 06/17/2017 but without definitive tissue diagnosis    stage IV chronic kidney disease, with elevated creatinine to 2.4  Diabetes with complications of renal insufficiency Hypertension Hyperlipidemia   The goals risks and alternatives of the planned surgical procedure Procedure(s): VIDEO BRONCHOSCOPY WITH ENDOBRONCHIAL ULTRASOUND (N/A)  have been discussed with the patient in detail. The risks of the procedure including death, infection, stroke, myocardial infarction, bleeding, blood transfusion have all been discussed specifically.  I have quoted [name] a 2% of perioperative mortality and a complication rate as high as 30 %. The patient's questions have been answered.VIOLA PLACERES is willing  to proceed with the planned procedure.  Grace Isaac MD  [address] [place] [address] Office [phone]   Beeper [phone]  12/08/2017 7:31 AM

## 2017-12-09 ENCOUNTER — Encounter (HOSPITAL_COMMUNITY): Payer: Self-pay | Admitting: Cardiothoracic Surgery

## 2017-12-09 NOTE — Op Note (Signed)
NAME:  Ruben Conner, Ruben Conner NO.:  [id number]  MEDICAL RECORD NO.:  [id number]  LOCATION:  [facility]                         FACILITY:  [facility]  PHYSICIAN:  Lanelle Bal, MD    DATE OF BIRTH:  1930/06/22  DATE OF PROCEDURE:  12/08/2017 DATE OF DISCHARGE:                              OPERATIVE REPORT   PREOPERATIVE DIAGNOSIS:  Right lung mass, status post radiation therapy.  POSTOPERATIVE DIAGNOSIS:  Right lung mass, status post radiation therapy.  SURGICAL PROCEDURE:  Repeat bronchoscopy with endobronchial ultrasound and transbronchial biopsies and bronchial biopsies.  SURGEON:  Lanelle Bal, MD.  BRIEF HISTORY:  The patient is an 84 year old male who presented in the spring of 2018 with an enlarging right upper lobe lung mass with mediastinal invasion, clinically graded a 3A carcinoma of the lung, though no definitive tissue diagnosis could be made after 2 attempts at bronchus bronchoscopy and EBUS.  The patient was ultimately treated with radiation without a definitive tissue diagnosis recently and had increasing cough and repeat CT scan suggested enlarging mass versus evidence of postradiation changes.  Repeat bronchoscopy and EBUS were requested by the Oncology Service.  Procedure was explained to the patient and his son in detail and he was agreeable and signed informed consent.  DESCRIPTION OF PROCEDURE:  The patient underwent general endotracheal anesthesia without incident.  Appropriate time-out was performed and we proceeded with a 2.8-mm fiberoptic video bronchoscope and examined the right and left tracheobronchial tree.  The left tracheobronchial tree appeared normal without any endobronchial lesions to the subsegmental level.  In the right tracheobronchial tree, there was some slight narrowing of the takeoff of the right upper lobe bronchus, but as we moved to the subsegmental levels, there was no definite endobronchial lesion, but this area was more  inflamed and narrowed circumferentially slightly.  The middle lobe and lower lobe bronchus were normal appearing.  We then did numerous biopsies and brushings.  Specimen submitted included brushings of the right upper lobe, 2 sets of specimens.  We placed the EBUS scope and identified 10R, 11R, 4R, and 7 lymph nodes and these multiple passes were done at each of these levels and submitted for quick stain.  As the pathologist progressed through the cytology, no definitive malignancy was identified.  However, there were atypical cells on the brushings and a second set of brushings were obtained and also with the multiple biopsies of the right upper lobe bronchus and submitted for permanent section.  The tracheobronchial tree was suctioned of all secretions.  Blood loss was minimal.  The patient was then extubated in the operating room and transferred to the recovery room for postoperative observation.  He tolerated the procedure without obvious complication.     Lanelle Bal, MD     EG/MEDQ  D:  12/09/2017  T:  12/09/2017  Job:  [id number]

## 2017-12-10 ENCOUNTER — Ambulatory Visit: Payer: PPO | Admitting: Cardiothoracic Surgery

## 2017-12-10 LAB — CULTURE, RESPIRATORY W GRAM STAIN: Culture: NO GROWTH

## 2017-12-11 ENCOUNTER — Encounter: Payer: Self-pay | Admitting: *Deleted

## 2017-12-11 NOTE — Progress Notes (Signed)
Oncology Nurse Navigator Documentation  Oncology Nurse Navigator Flowsheets 12/11/2017  Navigator Location [facility]  Navigator Encounter Type Other/per Dr. Benay Spice, I updated him on recent pathology report. No new orders at this time.   Barriers/Navigation Needs Coordination of Care  Interventions Coordination of Care  Coordination of Care Other  Acuity Level 1  Time Spent with Patient 15

## 2017-12-16 ENCOUNTER — Encounter: Payer: Self-pay | Admitting: Oncology

## 2017-12-16 ENCOUNTER — Telehealth: Payer: Self-pay

## 2017-12-16 ENCOUNTER — Inpatient Hospital Stay (HOSPITAL_BASED_OUTPATIENT_CLINIC_OR_DEPARTMENT_OTHER): Payer: PPO | Admitting: Oncology

## 2017-12-16 VITALS — BP 119/67 | HR 51 | Temp 97.6°F | Resp 17 | Ht 72.0 in | Wt 198.6 lb

## 2017-12-16 DIAGNOSIS — R590 Localized enlarged lymph nodes: Secondary | ICD-10-CM

## 2017-12-16 DIAGNOSIS — R918 Other nonspecific abnormal finding of lung field: Secondary | ICD-10-CM

## 2017-12-16 NOTE — Progress Notes (Signed)
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  He reports improvement in his cough after beginning antibiotics 12/03/2017. He was taken to a bronchoscopy and endobronchial ultrasound procedure by Dr. Servando Snare on 12/08/2017.  Examination of the left bronchial tree was unremarkable.  There was narrowing of the subsegmental right upper lobe bronchus without endobronchial tumor.  Multiple biopsies and brushings were obtained including brushings of the right upper lobe, and level 10 R, 11 R, 4R, and 7 lymph nodes.  He had mild hemoptysis following the procedure.  He felt well for a few days following the procedure and then developed a fever.  He took Tylenol and the fever has resolved.  He has felt better over the past several days.   Objective:  Vital signs in last 24 hours:  Blood pressure 119/67, pulse (!) 51, temperature 97.6 F (36.4 C), temperature source Oral, resp. rate 17, height 6' (1.829 m), weight 198 lb 9.6 oz (90.1 kg), SpO2 98 %.    HEENT: Neck without mass Lymphatics: No cervical, supraclavicular, or axillary nodes Resp: Good air movement bilaterally, no wheezing, end inspiratory rhonchi at the upper posterior chest bilaterally, no respiratory distress Cardio: Distant heart sounds, regular rate and rhythm GI: No hepatomegaly Vascular: Trace ankle edema bilaterally      Lab Results:  Lab Results  Component Value Date   WBC 6.4 12/08/2017   HGB 11.1 (L) 12/08/2017   HCT 32.6 (L) 12/08/2017   MCV 85.3 12/08/2017   PLT 209 12/08/2017   NEUTROABS 4.8 07/23/2017    CMP     Component Value Date/Time   NA 135 12/08/2017 0650   NA 142 07/23/2017 0925   K 3.5 12/08/2017 0650   CL 103 12/08/2017 0650   CO2 17 (L) 12/08/2017 0650   GLUCOSE 69 12/08/2017 0650   BUN 54 (H) 12/08/2017 0650   BUN 41 (H) 07/23/2017 0925   CREATININE 2.70 (H) 12/08/2017 0650   CALCIUM 8.7 (L) 12/08/2017 0650    PROT 6.3 (L) 12/08/2017 0650   PROT 6.9 07/23/2017 0925   ALBUMIN 3.3 (L) 12/08/2017 0650   ALBUMIN 3.9 07/23/2017 0925   AST 15 12/08/2017 0650   ALT 11 (L) 12/08/2017 0650   ALKPHOS 76 12/08/2017 0650   BILITOT 0.7 12/08/2017 0650   BILITOT 0.3 07/23/2017 0925   GFRNONAA 20 (L) 12/08/2017 0650   GFRAA 23 (L) 12/08/2017 0650     Medications: I have reviewed the patient's current medications.   Assessment/Plan: 1. Right hilar mass, mediastinal adenopathy ? CT chest 02/26/2017 with a right hilar/suprahilar mass and enlargement of several lymph nodes ? PET scan 2020-11-516-hypermetabolic medial right upper lobe mass, hypermetabolic mediastinal lymph nodes, hypermetabolic left parotid nodule ? Bronchoscopy 03/23/2017-normal airways, EBUS biopsy of a right paratracheal node in the right upper lung with bronchial brushings/washings returned nondiagnostic ? Bronchoscopy/EBUSand mediastinoscopy 04/08/2017-multiple cytologies and lymph node biopsies revealed no malignancy  Radiation to the chest 04/22/2017-05/05/2017, 30Gy in 10 fractions, additional 24Gyin 8 fractions completed 06/17/2017  CT chest 08/04/2017- decrease in right hilar mass and mediastinal lymphadenopathy  CT chest 12/03/2017-increased right hilar mass and mediastinal adenopathy.  Extensive consolidation throughout the right lung, subpleural consolidation in the medial left upper lobe  Bronchoscopy/EBUS 12/06/2017, no endobronchial tumor seen, brushings/biopsies from the right upper lobe and biopsies from multiple lymph nodes revealed "atypical cells ", nondiagnostic of malignancy 2. Renal insufficiency 3. 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  I discussed the bronchoscopy and pathology findings with him.  The biopsy material is nondiagnostic.  He understands we cannot recommend systemic chemotherapy unless we have a definitive diagnosis of malignancy.  I will contact Dr. Servando Snare to discuss  alternate biopsy options.  His case will be presented at the thoracic tumor conference tomorrow.  [name] will return for an office visit in 3 weeks.  Betsy Coder, MD  12/16/2017  1:12 PM

## 2017-12-16 NOTE — Telephone Encounter (Signed)
Printed avs and calender for upcoming appointment. Per 1/30 los 

## 2017-12-17 ENCOUNTER — Telehealth: Payer: Self-pay | Admitting: *Deleted

## 2017-12-17 ENCOUNTER — Encounter: Payer: Self-pay | Admitting: *Deleted

## 2017-12-17 ENCOUNTER — Other Ambulatory Visit: Payer: Self-pay | Admitting: *Deleted

## 2017-12-17 DIAGNOSIS — C3411 Malignant neoplasm of upper lobe, right bronchus or lung: Secondary | ICD-10-CM

## 2017-12-17 NOTE — Telephone Encounter (Signed)
Oncology Nurse Navigator Documentation  Oncology Nurse Navigator Flowsheets 12/17/2017  Navigator Location [facility]  Navigator Encounter Type Telephone/I updated Dr. Benay Spice on cancer conference discussion. Verbal orders received. I called central scheduling for PET scan appt. I called patient to updated him on cancer conference discussion and appts for PET and follow up with Dr. Benay Spice. Patient verbalized understanding of appts and pre-procedure instructions.   Telephone Outgoing Call  Treatment Phase Abnormal Scans  Barriers/Navigation Needs Coordination of Care;Education  Education Other  Interventions Coordination of Care;Education  Coordination of Care Appts  Acuity Level 2  Time Spent with Patient 36

## 2017-12-19 ENCOUNTER — Other Ambulatory Visit: Payer: Self-pay | Admitting: Family Medicine

## 2017-12-21 NOTE — Telephone Encounter (Signed)
Next OV 01/13/18

## 2017-12-25 ENCOUNTER — Encounter (HOSPITAL_COMMUNITY)
Admission: RE | Admit: 2017-12-25 | Discharge: 2017-12-25 | Disposition: A | Discharge status: Home / Self Care | Payer: PPO | Source: Ambulatory Visit | Attending: Oncology | Admitting: Oncology

## 2017-12-25 DIAGNOSIS — C3411 Malignant neoplasm of upper lobe, right bronchus or lung: Secondary | ICD-10-CM | POA: Insufficient documentation

## 2017-12-25 DIAGNOSIS — C3431 Malignant neoplasm of lower lobe, right bronchus or lung: Secondary | ICD-10-CM | POA: Diagnosis not present

## 2017-12-25 LAB — GLUCOSE, CAPILLARY: Glucose-Capillary: 69 mg/dL (ref 65–99)

## 2017-12-25 MED ORDER — FLUDEOXYGLUCOSE F - 18 (FDG) INJECTION
9.8800 | Freq: Once | INTRAVENOUS | Status: AC | PRN
  Administered 2017-12-25: 9.88 via INTRAVENOUS

## 2017-12-28 ENCOUNTER — Encounter: Payer: Self-pay | Admitting: Oncology

## 2017-12-28 ENCOUNTER — Inpatient Hospital Stay: Payer: PPO | Attending: Oncology | Admitting: Oncology

## 2017-12-28 ENCOUNTER — Telehealth: Payer: Self-pay | Admitting: Oncology

## 2017-12-28 VITALS — BP 135/68 | HR 72 | Temp 97.6°F | Resp 19 | Ht 72.0 in | Wt 193.6 lb

## 2017-12-28 DIAGNOSIS — N289 Disorder of kidney and ureter, unspecified: Secondary | ICD-10-CM | POA: Insufficient documentation

## 2017-12-28 DIAGNOSIS — R590 Localized enlarged lymph nodes: Secondary | ICD-10-CM

## 2017-12-28 DIAGNOSIS — Z87891 Personal history of nicotine dependence: Secondary | ICD-10-CM | POA: Diagnosis not present

## 2017-12-28 DIAGNOSIS — R918 Other nonspecific abnormal finding of lung field: Secondary | ICD-10-CM | POA: Diagnosis not present

## 2017-12-28 DIAGNOSIS — C3411 Malignant neoplasm of upper lobe, right bronchus or lung: Secondary | ICD-10-CM | POA: Diagnosis present

## 2017-12-28 NOTE — Telephone Encounter (Signed)
Gave patient avs and calendar with appts per 2/11 los.  °

## 2017-12-28 NOTE — Progress Notes (Signed)
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  He continues to feel better after completing the course of antibiotics.  No significant dyspnea or cough.  Good appetite.  Objective:  Vital signs in last 24 hours:  Blood pressure 135/68, pulse 72, temperature 97.6 F (36.4 C), temperature source Oral, resp. rate 19, height 6' (1.829 m), weight 193 lb 9.6 oz (87.8 kg), SpO2 98 %.    HEENT: Neck without mass Lymphatics: No cervical or supraclavicular nodes Resp: Good air movement bilaterally, clear, no wheezing Cardio: Regular rate and rhythm GI: No hepatomegaly Vascular: No leg edema   Lab Results:  Lab Results  Component Value Date   WBC 6.4 12/08/2017   HGB 11.1 (L) 12/08/2017   HCT 32.6 (L) 12/08/2017   MCV 85.3 12/08/2017   PLT 209 12/08/2017   NEUTROABS 4.8 07/23/2017    CMP     Component Value Date/Time   NA 135 12/08/2017 0650   NA 142 07/23/2017 0925   K 3.5 12/08/2017 0650   CL 103 12/08/2017 0650   CO2 17 (L) 12/08/2017 0650   GLUCOSE 69 12/08/2017 0650   BUN 54 (H) 12/08/2017 0650   BUN 41 (H) 07/23/2017 0925   CREATININE 2.70 (H) 12/08/2017 0650   CALCIUM 8.7 (L) 12/08/2017 0650   PROT 6.3 (L) 12/08/2017 0650   PROT 6.9 07/23/2017 0925   ALBUMIN 3.3 (L) 12/08/2017 0650   ALBUMIN 3.9 07/23/2017 0925   AST 15 12/08/2017 0650   ALT 11 (L) 12/08/2017 0650   ALKPHOS 76 12/08/2017 0650   BILITOT 0.7 12/08/2017 0650   BILITOT 0.3 07/23/2017 0925   GFRNONAA 20 (L) 12/08/2017 0650   GFRAA 23 (L) 12/08/2017 0650    No results found for: CEA1  Lab Results  Component Value Date   INR 1.09 12/08/2017    Imaging:  Nm Pet Image Restag (ps) Skull Base To Thigh  Result Date: 12/26/2017 CLINICAL DATA:  Subsequent treatment strategy for lung cancer. EXAM: NUCLEAR MEDICINE PET SKULL BASE TO THIGH TECHNIQUE: 9.89 mCi F-18 FDG was injected intravenously. Full-ring PET imaging  was performed from the skull base to thigh after the radiotracer. CT data was obtained and used for attenuation correction and anatomic localization. FASTING BLOOD GLUCOSE:  Value: 69 mg/dl COMPARISON:  PET-CT 07-Nov-202018 FINDINGS: NECK: No hypermetabolic lymph nodes in the neck. There are bilateral parotid gland lesions which are hypermetabolic. These are likely bilateral Warthin's tumors. These are stable. Mild diffuse symmetric uptake in the parotid and submandibular glands. CHEST: The right hilar mass measures approximately 5.9 x 3.9 cm and previously measured 5.6 x 5.0 cm. The lesion remains hypermetabolic with SUV max of 52.8. There was previously 8.2. 10.5 mm upper right mediastinal node on image number 63 has an SUV max of 12.7. This previously measured 8 mm and SUV max was 2.9 10 mm subcarinal lymph node on image number 85 has an SUV max of 6.6. This was not previously hypermetabolic but is stable in size. 20 x 10 mm right apical pulmonary lesion is new and has an SUV max of 12.7. Ill-defined 2.5 cm right upper lobe lesion is hypermetabolic with SUV max of 5.9. This could be tumor or infiltrate. New 13 x 11 mm left upper lobe pulmonary lesion medially on image number 28 is hypermetabolic with SUV max of 4.3. Extensive right lower lobe airspace process and right pleural effusion with SUV max approximately 12.  This is suspicious for interstitial spread of tumor and malignant pleural effusion but pneumonia is a possibility. ABDOMEN/PELVIS: No abnormal hypermetabolic activity within the liver, pancreas or spleen. No hypermetabolic lymph nodes in the abdomen or pelvis. SKELETON: No focal hypermetabolic activity to suggest skeletal metastasis. IMPRESSION: 1. PET-CT suggests progressive lung cancer with new hypermetabolic pulmonary lesions bilaterally and progressively hypermetabolic mediastinal lymphadenopathy. 2. Postobstructive pneumonia versus more likely interstitial spread of tumor in the right lower lobe.  Probable malignant right pleural effusion also. 3. Bilateral parotid gland lesions are hypermetabolic and likely Warthin's tumors. 4. Periportal lymph node is now hypermetabolic and concerning for metastasis. 5. Stable small left adrenal gland nodule is indeterminate. Electronically Signed   By: Marijo Sanes M.D.   On: 12/26/2017 09:08    Medications: I have reviewed the patient's current medications.   Assessment/Plan: 1. Right hilar mass, mediastinal adenopathy ? CT chest 02/26/2017 with a right hilar/suprahilar mass and enlargement of several lymph nodes ? PET scan 07-08-202018-hypermetabolic medial right upper lobe mass, hypermetabolic mediastinal lymph nodes, hypermetabolic left parotid nodule ? Bronchoscopy 03/23/2017-normal airways, EBUS biopsy of a right paratracheal node in the right upper lung with bronchial brushings/washings returned nondiagnostic ? Bronchoscopy/EBUSand mediastinoscopy 04/08/2017-multiple cytologies and lymph node biopsies revealed no malignancy  Radiation to the chest 04/22/2017-05/05/2017, 30Gy in 10 fractions, additional 24Gyin 8 fractions completed 06/17/2017  CT chest 08/04/2017- decrease in right hilar mass and mediastinal lymphadenopathy  CT chest 12/03/2017-increased right hilar mass and mediastinal adenopathy.  Extensive consolidation throughout the right lung, subpleural consolidation in the medial left upper lobe  Bronchoscopy/EBUS 12/06/2017, no endobronchial tumor seen, brushings/biopsies from the right upper lobe and biopsies from multiple lymph nodes revealed "atypical cells ", nondiagnostic of malignancy  PET scan 02/21/961- hypermetabolic mediastinal lymphadenopathy, hypermetabolic lung lesions, hypermetabolic periportal node, postobstructive pneumonitis versus tumor spread in the right lung, hypermetabolic right hilar mass 2. Renal insufficiency 3. 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  I reviewed the PET images  with Dr. Gilford Ruben and his son.  He understands the clinical presentation is most consistent with lung cancer.  It is possible the chest findings are related to a different malignancy or a benign process.  I will review the PET images with interventional radiology and Dr. Servando Snare to discuss biopsy options.  We will  contact Dr. Gilford Ruben within the next few days with a biopsy plan.  He will return for an office visit in approximately 2 weeks.  25 minutes were spent with the patient today.  The majority of the time was used for counseling and coordination of care.  Betsy Coder, MD  12/28/2017  9:28 AM

## 2017-12-30 ENCOUNTER — Other Ambulatory Visit: Payer: Self-pay | Admitting: Oncology

## 2017-12-30 DIAGNOSIS — R918 Other nonspecific abnormal finding of lung field: Secondary | ICD-10-CM

## 2017-12-30 NOTE — Progress Notes (Signed)
I have evaluated the recent PET CT. The patient has had negative bronch Bx's with a positive PET CT. We can offer RUL lung mass Bx. PET CT im 77, se 4.

## 2018-01-06 ENCOUNTER — Other Ambulatory Visit: Payer: Self-pay | Admitting: Student

## 2018-01-06 ENCOUNTER — Other Ambulatory Visit: Payer: Self-pay | Admitting: General Surgery

## 2018-01-07 ENCOUNTER — Ambulatory Visit (HOSPITAL_COMMUNITY)
Admission: RE | Admit: 2018-01-07 | Discharge: 2018-01-07 | Disposition: A | Discharge status: Home / Self Care | Payer: PPO | Source: Ambulatory Visit | Attending: Interventional Radiology | Admitting: Interventional Radiology

## 2018-01-07 ENCOUNTER — Ambulatory Visit (HOSPITAL_COMMUNITY)
Admission: RE | Admit: 2018-01-07 | Discharge: 2018-01-07 | Disposition: A | Discharge status: Home / Self Care | Payer: PPO | Source: Ambulatory Visit | Attending: Oncology | Admitting: Oncology

## 2018-01-07 ENCOUNTER — Encounter (HOSPITAL_COMMUNITY): Payer: Self-pay

## 2018-01-07 ENCOUNTER — Ambulatory Visit: Payer: PPO | Admitting: Oncology

## 2018-01-07 DIAGNOSIS — Z9889 Other specified postprocedural states: Secondary | ICD-10-CM | POA: Insufficient documentation

## 2018-01-07 DIAGNOSIS — R918 Other nonspecific abnormal finding of lung field: Secondary | ICD-10-CM | POA: Diagnosis not present

## 2018-01-07 DIAGNOSIS — J189 Pneumonia, unspecified organism: Secondary | ICD-10-CM | POA: Diagnosis not present

## 2018-01-07 DIAGNOSIS — R911 Solitary pulmonary nodule: Secondary | ICD-10-CM | POA: Diagnosis not present

## 2018-01-07 LAB — CBC
HCT: 34 % — ABNORMAL LOW (ref 39.0–52.0)
Hemoglobin: 11.4 g/dL — ABNORMAL LOW (ref 13.0–17.0)
MCH: 29 pg (ref 26.0–34.0)
MCHC: 33.5 g/dL (ref 30.0–36.0)
MCV: 86.5 fL (ref 78.0–100.0)
PLATELETS: 206 10*3/uL (ref 150–400)
RBC: 3.93 MIL/uL — ABNORMAL LOW (ref 4.22–5.81)
RDW: 14.2 % (ref 11.5–15.5)
WBC: 5.3 10*3/uL (ref 4.0–10.5)

## 2018-01-07 LAB — APTT: APTT: 28 s (ref 24–36)

## 2018-01-07 LAB — GLUCOSE, CAPILLARY: GLUCOSE-CAPILLARY: 93 mg/dL (ref 65–99)

## 2018-01-07 LAB — PROTIME-INR
INR: 1.09
Prothrombin Time: 14 seconds (ref 11.4–15.2)

## 2018-01-07 MED ORDER — FENTANYL CITRATE (PF) 100 MCG/2ML IJ SOLN
INTRAMUSCULAR | Status: AC
  Filled 2018-01-07: qty 4

## 2018-01-07 MED ORDER — LIDOCAINE HCL 1 % IJ SOLN
INTRAMUSCULAR | Status: AC
  Filled 2018-01-07: qty 20

## 2018-01-07 MED ORDER — SODIUM CHLORIDE 0.9 % IV SOLN
INTRAVENOUS | Status: DC
  Administered 2018-01-07: 12:00:00 via INTRAVENOUS

## 2018-01-07 MED ORDER — FENTANYL CITRATE (PF) 100 MCG/2ML IJ SOLN
INTRAMUSCULAR | Status: AC | PRN
  Administered 2018-01-07: 50 ug via INTRAVENOUS

## 2018-01-07 MED ORDER — MIDAZOLAM HCL 2 MG/2ML IJ SOLN
INTRAMUSCULAR | Status: AC
  Filled 2018-01-07: qty 4

## 2018-01-07 MED ORDER — MIDAZOLAM HCL 2 MG/2ML IJ SOLN
INTRAMUSCULAR | Status: AC | PRN
  Administered 2018-01-07 (×2): 1 mg via INTRAVENOUS

## 2018-01-07 NOTE — Sedation Documentation (Signed)
Patient is resting comfortably. 

## 2018-01-07 NOTE — H&P (Signed)
Chief Complaint: RUL lung nodule  Referring Physician:Dr. Dominica Severin B. Harvel  Supervising Physician: Jacqulynn Cadet  Patient Status: [facility] - Out-pt  HPI: Ruben Conner is a 84 y.o. male with a history of DM, HTN, renal insufficiency, who is being treated for lung cancer.  He was diagnosed by PET scan last year in May.  He has undergone several rounds of radiation.  He has undergone multiple bronchoscopies and mediastinoscopy by pulmonary and thoracic surgery in the last year for different lymph nodes with all results being nondiagnostic.  In order to finally get a tissue diagnosis as his disease appears to be progressing on recent PET scan, a lung nodule biopsy has been requested.  He presents today for this procedure.    Past Medical History:  Past Medical History:  Diagnosis Date  . Cancer ([place])    lung  . Diabetes mellitus without complication ([place])   . Hyperlipidemia   . Hypertension   . Irregular heart rate    Dr. Victorino December note from 04/2017 states bradycardia, LBBB  . Pneumonia   . Renal insufficiency     Past Surgical History:  Past Surgical History:  Procedure Laterality Date  . cyst removed    . ENDOBRONCHIAL ULTRASOUND Bilateral 03/23/2017   Procedure: ENDOBRONCHIAL ULTRASOUND;  Surgeon: Rigoberto Noel, MD;  Location: [facility];  Service: Cardiopulmonary;  Laterality: Bilateral;  . EYE SURGERY    . MEDIASTINOSCOPY N/A 04/08/2017   Procedure: MEDIASTINOSCOPY;  Surgeon: Grace Isaac, MD;  Location: [place];  Service: Thoracic;  Laterality: N/A;  . MOUTH SURGERY    . ROTATOR CUFF REPAIR    . tumor remoced from neck    . VIDEO BRONCHOSCOPY WITH ENDOBRONCHIAL ULTRASOUND N/A 04/08/2017   Procedure: VIDEO BRONCHOSCOPY WITH ENDOBRONCHIAL ULTRASOUND;  Surgeon: Grace Isaac, MD;  Location: [place];  Service: Thoracic;  Laterality: N/A;  . VIDEO BRONCHOSCOPY WITH ENDOBRONCHIAL ULTRASOUND N/A 12/08/2017   Procedure: VIDEO BRONCHOSCOPY with BRONCHIAL BIOPSIES AND  ENDOBRONCHIAL ULTRASOUND WITH TRANSBRONCHIAL AND BRONCHIAL BIOPSIES;  Surgeon: Grace Isaac, MD;  Location: [facility] OR;  Service: Thoracic;  Laterality: N/A;    Family History:  Family History  Problem Relation Age of Onset  . Kidney disease Mother   . Heart disease Father   . Cancer Son        prostate  . Suicidality Son     Social History:  reports that he quit smoking about 13 months ago. His smoking use included cigarettes. He has a 5.30 pack-year smoking history. he has never used smokeless tobacco. He reports that he does not drink alcohol or use drugs.  Allergies: No Known Allergies  Medications: Medications reviewed in epic  Please HPI for pertinent positives, otherwise complete 10 system ROS negative.  Mallampati Score: MD Evaluation Airway: WNL Heart: WNL Abdomen: WNL Chest/ Lungs: WNL ASA  Classification: 3 Mallampati/Airway Score: One  Physical Exam: BP 126/72   Pulse 98   Temp 97.8 F (36.6 C) (Oral)   Resp 16   Ht 6' (1.829 m)   Wt 196 lb (88.9 kg)   SpO2 100%   BMI 26.58 kg/m  Body mass index is 26.58 kg/m. General: pleasant, WD, WN white male who is laying in bed in NAD HEENT: head is normocephalic, atraumatic.  Sclera are noninjected.  PERRL.  Ears and nose without any masses or lesions.  Mouth is pink and moist Heart: regular, rate, and rhythm.  Normal s1,s2. No obvious murmurs, gallops, or rubs noted.  Palpable radial pulses bilaterally Lungs: CTAB, no wheezes, rhonchi, or rales noted.  Respiratory effort nonlabored Abd: soft, NT, ND, +BS, no masses, hernias, or organomegaly Psych: A&Ox3 with an appropriate affect.   Labs: Results for orders placed or performed during the hospital encounter of 01/07/18 (from the past 48 hour(s))  Glucose, capillary     Status: None   Collection Time: 01/07/18  9:27 AM  Result Value Ref Range   Glucose-Capillary 93 65 - 99 mg/dL  CBC upon arrival     Status: Abnormal   Collection Time: 01/07/18  9:50 AM    Result Value Ref Range   WBC 5.3 4.0 - 10.5 K/uL   RBC 3.93 (L) 4.22 - 5.81 MIL/uL   Hemoglobin 11.4 (L) 13.0 - 17.0 g/dL   HCT 34.0 (L) 39.0 - 52.0 %   MCV 86.5 78.0 - 100.0 fL   MCH 29.0 26.0 - 34.0 pg   MCHC 33.5 30.0 - 36.0 g/dL   RDW 14.2 11.5 - 15.5 %   Platelets 206 150 - 400 K/uL    Comment: Performed at [facility], [place] [address] [place], [place] [address]  APTT upon arrival     Status: None   Collection Time: 01/07/18  9:50 AM  Result Value Ref Range   aPTT 28 24 - 36 seconds    Comment: Performed at [facility] [address], [place], [place] [address]  Protime-INR upon arrival     Status: None   Collection Time: 01/07/18  9:50 AM  Result Value Ref Range   Prothrombin Time 14.0 11.4 - 15.2 seconds   INR 1.09     Comment: Performed at [facility] [address], [place], [place] [address]    Imaging: No results found.  Assessment/Plan 1. RUL lung nodule  Will proceed with a lung biopsy today.  Labs and vitals reviewed.   Risks and benefits discussed with the patient including, but not limited to bleeding, hemoptysis, respiratory failure requiring intubation, infection, pneumothorax requiring chest tube placement, stroke from air embolism or even death.  All of the patient's questions were answered, patient is agreeable to proceed. Consent signed and in chart.   Thank you for this interesting consult.  I greatly enjoyed meeting WENDELIN BRADT and look forward to participating in their care.  A copy of this report was sent to the requesting provider on this date.  Electronically Signed: Henreitta Cea 01/07/2018, 11:31 AM   I spent a total of  30 Minutes   in face to face in clinical consultation, greater than 50% of which was counseling/coordinating care for lung nodule

## 2018-01-07 NOTE — Sedation Documentation (Signed)
Pt alert and talking.  NAD noted.

## 2018-01-07 NOTE — Sedation Documentation (Signed)
Pt had a nosebleed.  Airway remains patent.  Bleeding has stopped. Pt a/o x 4 per his baseline.  Dr. Laurence Ferrari made aware.

## 2018-01-07 NOTE — Procedures (Signed)
Interventional Radiology Procedure Note  Procedure: CT guided biopsy of RUL mass-like consolidation Complications: No immediate Recommendations: - Bedrest until CXR cleared.  Minimize talking, coughing or otherwise straining.  - Follow up 1 hr CXR pending   Signed,  Criselda Peaches, MD

## 2018-01-07 NOTE — Progress Notes (Signed)
Patient has no complaints of pain or otherwise and in stable condition to be discharged with his family.

## 2018-01-13 ENCOUNTER — Ambulatory Visit (INDEPENDENT_AMBULATORY_CARE_PROVIDER_SITE_OTHER): Payer: PPO

## 2018-01-13 ENCOUNTER — Encounter: Payer: Self-pay | Admitting: Family Medicine

## 2018-01-13 ENCOUNTER — Ambulatory Visit (INDEPENDENT_AMBULATORY_CARE_PROVIDER_SITE_OTHER): Payer: PPO | Admitting: Family Medicine

## 2018-01-13 VITALS — BP 135/64 | HR 65 | Temp 96.9°F | Ht 72.0 in | Wt 193.0 lb

## 2018-01-13 DIAGNOSIS — D631 Anemia in chronic kidney disease: Secondary | ICD-10-CM | POA: Diagnosis not present

## 2018-01-13 DIAGNOSIS — M25561 Pain in right knee: Secondary | ICD-10-CM | POA: Diagnosis not present

## 2018-01-13 DIAGNOSIS — M25562 Pain in left knee: Secondary | ICD-10-CM

## 2018-01-13 DIAGNOSIS — M1712 Unilateral primary osteoarthritis, left knee: Secondary | ICD-10-CM | POA: Diagnosis not present

## 2018-01-13 DIAGNOSIS — I10 Essential (primary) hypertension: Secondary | ICD-10-CM

## 2018-01-13 DIAGNOSIS — C3411 Malignant neoplasm of upper lobe, right bronchus or lung: Secondary | ICD-10-CM

## 2018-01-13 DIAGNOSIS — H6123 Impacted cerumen, bilateral: Secondary | ICD-10-CM | POA: Diagnosis not present

## 2018-01-13 DIAGNOSIS — R590 Localized enlarged lymph nodes: Secondary | ICD-10-CM | POA: Diagnosis not present

## 2018-01-13 DIAGNOSIS — E119 Type 2 diabetes mellitus without complications: Secondary | ICD-10-CM | POA: Diagnosis not present

## 2018-01-13 DIAGNOSIS — R9389 Abnormal findings on diagnostic imaging of other specified body structures: Secondary | ICD-10-CM | POA: Diagnosis not present

## 2018-01-13 DIAGNOSIS — E559 Vitamin D deficiency, unspecified: Secondary | ICD-10-CM

## 2018-01-13 DIAGNOSIS — N184 Chronic kidney disease, stage 4 (severe): Secondary | ICD-10-CM | POA: Diagnosis not present

## 2018-01-13 DIAGNOSIS — M1711 Unilateral primary osteoarthritis, right knee: Secondary | ICD-10-CM | POA: Diagnosis not present

## 2018-01-13 NOTE — Progress Notes (Signed)
Subjective:    Patient ID: Ruben Conner, male    DOB: 1930-08-20, 84 y.o.   MRN: [id number]  HPI Pt here for follow up and management of chronic medical problems which includes diabetes and hypertension. He is taking medication regularly.  The patient comes to his regularly scheduled visit.  He has had multiple visits to hospital specialist and has a recent CT guided biopsy which once again was negative for malignancy.  He will see Dr. Malachy Mood tomorrow to get a copy of that information.  He feels that he has had more wheezing issues since the most recent biopsy.  He prefers that we wait on additional lab work and we will wait because he has had so many blood draws at the hospital recently.  The most recent CT scan guided biopsy was once again negative for malignancy.  Several cultures are still pending from the biopsy material.  The patient has an appointment with a hematologist/oncologist tomorrow to get more information about what direction to go into next.  He says the fact of not knowing has been bothering him and I encouraged him to take 1 daily time and be thankful that nothing has shown up at this point in time and to let the doctors worry about what step to go to next to get an answer to the abnormal chest x-ray.  Since the last visit I only see a 2 pound weight drop.  He denies any chest pain or shortness of breath.  He has had some slight congestion recently but no fever.  He denies any trouble with his stomach including nausea vomiting diarrhea blood in the stool or black tarry bowel movements.  He is passing his water as well as can be expected for an 84 year old man.  He has some urgency frequency and occasional incontinence.  He is also having some trouble with both knees.  We will get x-rays of his knees before he leaves the office and I told him to stay away from any anti-inflammatory medicines and only take Tylenol for pain.  He is seeing Dr. Durward Fortes in the past.  The patient requests that  we do no blood work today.  He states that he has had lots of blood work done in the hospital.     Patient Active Problem List   Diagnosis Date Noted  . Primary cancer of right upper lobe of lung ([place])   . Mediastinal lymphadenopathy 03/10/2017  . Anemia in stage 4 chronic kidney disease ([place]) 09/18/2016  . Vitamin D deficiency 09/18/2016  . Sinus bradycardia 08/31/2014  . First degree AV block 08/31/2014  . LBBB (left bundle branch block) 08/31/2014  . HTN (hypertension) 09/20/2013  . Diabetes ([place]) 05/02/2013  . Hyperlipemia 05/02/2013   Outpatient Encounter Medications as of 01/13/2018  Medication Sig  . amLODipine (NORVASC) 5 MG tablet TAKE (1) TABLET TWICE A DAY.  . benazepril (LOTENSIN) 20 MG tablet TAKE (1) TABLET TWICE A DAY.  Marland Kitchen glimepiride (AMARYL) 4 MG tablet TAKE 1 TABLET DAILY  . hydrochlorothiazide (HYDRODIURIL) 25 MG tablet Take 25 mg by mouth every morning.   . niacin (NIASPAN) 500 MG CR tablet Take 500-1,000 mg by mouth 2 (two) times daily. 500 in AM and 1000 in evening  . pioglitazone (ACTOS) 30 MG tablet TAKE 1 TABLET DAILY  . potassium chloride SA (K-DUR,KLOR-CON) 20 MEQ tablet TAKE 1 TABLET DAILY  . travoprost, benzalkonium, (TRAVATAN) 0.004 % ophthalmic solution Place 1 drop into both eyes at bedtime.  No facility-administered encounter medications on file as of 01/13/2018.      Review of Systems  Constitutional: Negative.   HENT: Negative.   Eyes: Negative.   Respiratory: Positive for wheezing.   Cardiovascular: Negative.   Gastrointestinal: Negative.   Endocrine: Negative.   Genitourinary: Negative.   Musculoskeletal: Negative.   Skin: Negative.   Allergic/Immunologic: Negative.   Neurological: Negative.   Hematological: Negative.   Psychiatric/Behavioral: Negative.        Objective:   Physical Exam  Constitutional: He is oriented to person, place, and time. He appears well-developed and well-nourished. No distress.  The patient is pleasant  and alert and looks younger than his stated age  HENT:  Head: Normocephalic and atraumatic.  Mouth/Throat: Oropharynx is clear and moist. No oropharyngeal exudate.  Nasal septal irritation on the right side being greater than the left.  We will do an irrigation today to help remove the cerumen.  Eyes: Conjunctivae and EOM are normal. Pupils are equal, round, and reactive to light. Right eye exhibits no discharge. Left eye exhibits no discharge. No scleral icterus.  Neck: Normal range of motion. Neck supple. No thyromegaly present.  Cardiovascular: Normal rate, regular rhythm, normal heart sounds and intact distal pulses.  No murmur heard. The heart is slightly irregular at 72/min.  Some pedal edema bilaterally  Pulmonary/Chest: Effort normal and breath sounds normal. No respiratory distress. He has no wheezes. He has no rales. He exhibits no tenderness.  This chest had some congestion with deep breathing but I asked the patient to cough and he appeared to have good airway movement after that.  There were no rales.  There is no axillary adenopathy.  Abdominal: Soft. Bowel sounds are normal. He exhibits no mass. There is no tenderness. There is no rebound and no guarding.  No abdominal tenderness masses organ enlargement bruits or inguinal adenopathy  Musculoskeletal: Normal range of motion. He exhibits tenderness. He exhibits no edema.  Joint line tenderness bilaterally in both knees  Lymphadenopathy:    He has no cervical adenopathy.  Neurological: He is alert and oriented to person, place, and time.  Skin: Skin is warm and dry. No rash noted.  Psychiatric: He has a normal mood and affect. His behavior is normal. Judgment and thought content normal.  Nursing note and vitals reviewed.   BP 135/64 (BP Location: Right Arm)   Pulse 65   Temp (!) 96.9 F (36.1 C) (Oral)   Ht 6' (1.829 m)   Wt 193 lb (87.5 kg)   BMI 26.18 kg/m   X-rays of both knees with results pending====       Assessment & Plan:  1. Vitamin D deficiency -The patient is currently not on any replacement.  This would be determined by his nephrologist.  2. Type 2 diabetes mellitus without complication, without long-term current use of insulin ([place]) -The patient does not check his blood sugars regularly at home because he indicates his sugars have been good over a long period of time.  3. Essential hypertension -The blood pressure is good today and he will continue with current treatment  4. Anemia in stage 4 chronic kidney disease ([place]) -Continue to follow-up with nephrology  5. Malignant neoplasm of upper lobe of right lung [facility]) -Surgery and oncology continue to follow-up with pulmonology  6. Mediastinal lymphadenopathy -Continue to follow-up with oncology and Dr. Benay Spice  7. Abnormal chest CT -Continue to follow-up with specialists  8. Acute pain of both knees -X-ray both knees.  Send copies of reports to Dr. Durward Fortes. - DG Knee 1-2 Views Right; Future - DG Knee 1-2 Views Left; Future  Patient Instructions                       Medicare Annual Wellness Visit  [facility] and the medical providers at [facility] strive to bring you the best medical care.  In doing so we not only want to address your current medical conditions and concerns but also to detect new conditions early and prevent illness, disease and health-related problems.    Medicare offers a yearly Wellness Visit which allows our clinical staff to assess your need for preventative services including immunizations, lifestyle education, counseling to decrease risk of preventable diseases and screening for fall risk and other medical concerns.    This visit is provided free of charge (no copay) for all Medicare recipients. The clinical pharmacists at [facility] have begun to conduct these Wellness Visits which will also include a thorough review of all your medications.    As  you primary medical provider recommend that you make an appointment for your Annual Wellness Visit if you have not done so already this year.  You may set up this appointment before you leave today or you may call back ([phone]) and schedule an appointment.  Please make sure when you call that you mention that you are scheduling your Annual Wellness Visit with the clinical pharmacist so that the appointment may be made for the proper length of time.     Continue current medications. Continue good therapeutic lifestyle changes which include good diet and exercise. Fall precautions discussed with patient. If an FOBT was given today- please return it to our front desk. If you are over 51 years old - you may need Prevnar 69 or the adult Pneumonia vaccine.  **Flu shots are available--- please call and schedule a FLU-CLINIC appointment**  After your visit with [place] today you will receive a survey in the mail or online from [employer] regarding your care with [place]. Please take a moment to fill this out. Your feedback is very important to [place] as you can help [place] better understand your patient needs as well as improve your experience and satisfaction. WE CARE ABOUT YOU!!!   Continue to follow-up with cardiology Continue to follow-up with oncology/hematology. Continue to follow-up with surgery depending upon visits from oncology We will call with the results of the x-rays as soon as they become available and make sure that Dr. Durward Fortes gets a copy of the report Take Tylenol as needed for pain    Arrie Senate MD

## 2018-01-13 NOTE — Patient Instructions (Addendum)
Medicare Annual Wellness Visit  [facility] and the medical providers at [facility] strive to bring you the best medical care.  In doing so we not only want to address your current medical conditions and concerns but also to detect new conditions early and prevent illness, disease and health-related problems.    Medicare offers a yearly Wellness Visit which allows our clinical staff to assess your need for preventative services including immunizations, lifestyle education, counseling to decrease risk of preventable diseases and screening for fall risk and other medical concerns.    This visit is provided free of charge (no copay) for all Medicare recipients. The clinical pharmacists at [facility] have begun to conduct these Wellness Visits which will also include a thorough review of all your medications.    As you primary medical provider recommend that you make an appointment for your Annual Wellness Visit if you have not done so already this year.  You may set up this appointment before you leave today or you may call back ([phone]) and schedule an appointment.  Please make sure when you call that you mention that you are scheduling your Annual Wellness Visit with the clinical pharmacist so that the appointment may be made for the proper length of time.     Continue current medications. Continue good therapeutic lifestyle changes which include good diet and exercise. Fall precautions discussed with patient. If an FOBT was given today- please return it to our front desk. If you are over 10 years old - you may need Prevnar 5 or the adult Pneumonia vaccine.  **Flu shots are available--- please call and schedule a FLU-CLINIC appointment**  After your visit with [place] today you will receive a survey in the mail or online from [employer] regarding your care with [place]. Please take a moment to fill this out. Your feedback is very  important to [place] as you can help [place] better understand your patient needs as well as improve your experience and satisfaction. WE CARE ABOUT YOU!!!   Continue to follow-up with cardiology Continue to follow-up with oncology/hematology. Continue to follow-up with surgery depending upon visits from oncology We will call with the results of the x-rays as soon as they become available and make sure that Dr. Durward Fortes gets a copy of the report Take Tylenol as needed for pain

## 2018-01-14 ENCOUNTER — Telehealth: Payer: Self-pay | Admitting: Oncology

## 2018-01-14 ENCOUNTER — Inpatient Hospital Stay (HOSPITAL_BASED_OUTPATIENT_CLINIC_OR_DEPARTMENT_OTHER): Payer: PPO | Admitting: Oncology

## 2018-01-14 VITALS — BP 109/57 | HR 102 | Temp 97.9°F | Resp 17 | Ht 72.0 in | Wt 194.2 lb

## 2018-01-14 DIAGNOSIS — Z923 Personal history of irradiation: Secondary | ICD-10-CM

## 2018-01-14 DIAGNOSIS — R590 Localized enlarged lymph nodes: Secondary | ICD-10-CM

## 2018-01-14 DIAGNOSIS — Z87891 Personal history of nicotine dependence: Secondary | ICD-10-CM

## 2018-01-14 DIAGNOSIS — N289 Disorder of kidney and ureter, unspecified: Secondary | ICD-10-CM | POA: Diagnosis not present

## 2018-01-14 DIAGNOSIS — R918 Other nonspecific abnormal finding of lung field: Secondary | ICD-10-CM | POA: Diagnosis not present

## 2018-01-14 NOTE — Telephone Encounter (Signed)
Scheduled appt per 2/28 los - Gave patient AVS and calender per los.

## 2018-01-14 NOTE — Progress Notes (Signed)
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  He feels well.  No new complaint.  He underwent a CT-guided biopsy of a right upper lung lesion on 01/07/2018.  He reports tolerating the procedure well. The pathology ([id number]) confirmed nonnecrotizing granulomatous inflammation.  No malignancy.  Stains for AFB, GMS, and PAS were negative.  Objective:  Vital signs in last 24 hours:  Blood pressure (!) 109/57, pulse (!) 102, temperature 97.9 F (36.6 C), temperature source Oral, resp. rate 17, height 6' (1.829 m), weight 194 lb 3.2 oz (88.1 kg), SpO2 98 %.    HEENT: Neck without mass Resp: Lungs clear bilaterally, no respiratory distress Cardio: Regular rate and rhythm, distant heart sounds GI: No hepatosplenomegaly, nontender Vascular: No leg edema   Portacath/PICC-without erythema  Lab Results:  Lab Results  Component Value Date   WBC 5.3 01/07/2018   HGB 11.4 (L) 01/07/2018   HCT 34.0 (L) 01/07/2018   MCV 86.5 01/07/2018   PLT 206 01/07/2018   NEUTROABS 4.8 07/23/2017    CMP     Component Value Date/Time   NA 135 12/08/2017 0650   NA 142 07/23/2017 0925   K 3.5 12/08/2017 0650   CL 103 12/08/2017 0650   CO2 17 (L) 12/08/2017 0650   GLUCOSE 69 12/08/2017 0650   BUN 54 (H) 12/08/2017 0650   BUN 41 (H) 07/23/2017 0925   CREATININE 2.70 (H) 12/08/2017 0650   CALCIUM 8.7 (L) 12/08/2017 0650   PROT 6.3 (L) 12/08/2017 0650   PROT 6.9 07/23/2017 0925   ALBUMIN 3.3 (L) 12/08/2017 0650   ALBUMIN 3.9 07/23/2017 0925   AST 15 12/08/2017 0650   ALT 11 (L) 12/08/2017 0650   ALKPHOS 76 12/08/2017 0650   BILITOT 0.7 12/08/2017 0650   BILITOT 0.3 07/23/2017 0925   GFRNONAA 20 (L) 12/08/2017 0650   GFRAA 23 (L) 12/08/2017 0650    No results found for: CEA1  Lab Results  Component Value Date   INR 1.09 01/07/2018    Imaging:  Dg Knee 1-2 Views Left  Result Date: 01/13/2018 CLINICAL  DATA:  Bilateral knee pain EXAM: LEFT KNEE - 1-2 VIEW COMPARISON:  Right knee performed today FINDINGS: Advanced tricompartment degenerative changes with joint space narrowing and spurring, most pronounced in the medial and patellofemoral compartments. No joint effusion. No acute bony abnormality. Specifically, no fracture, subluxation, or dislocation. Vascular calcifications noted. IMPRESSION: Advanced tricompartment degenerative changes. No acute bony abnormality. Electronically Signed   By: Rolm Baptise M.D.   On: 01/13/2018 19:21   Dg Knee 1-2 Views Right  Result Date: 01/13/2018 CLINICAL DATA:  Chronic knee pain.  No known injury. EXAM: RIGHT KNEE - 1-2 VIEW COMPARISON:  None. FINDINGS: Advanced tricompartment degenerative changes, most pronounced in the medial and patellofemoral compartments with joint space narrowing and spurring. No significant joint effusion. No acute bony abnormality. Specifically, no fracture, subluxation, or dislocation. Vascular calcifications noted. IMPRESSION: Advanced tricompartment degenerative changes as above. No acute bony abnormality. Electronically Signed   By: Rolm Baptise M.D.   On: 01/13/2018 19:21    Medications: I have reviewed the patient's current medications.   Assessment/Plan: 1. Right hilar mass, mediastinal adenopathy ? CT chest 02/26/2017 with a right hilar/suprahilar mass and enlargement of several lymph nodes ? PET scan 09-13-2017-hypermetabolic medial right upper lobe mass, hypermetabolic mediastinal lymph nodes, hypermetabolic left parotid nodule ? Bronchoscopy 03/23/2017-normal airways, EBUS biopsy of a right paratracheal node  in the right upper lung with bronchial brushings/washings returned nondiagnostic ? Bronchoscopy/EBUSand mediastinoscopy 04/08/2017-multiple cytologies and lymph node biopsies revealed no malignancy  Radiation to the chest 04/22/2017-05/05/2017, 30Gy in 10 fractions, additional 24Gyin 8 fractions completed  06/17/2017  CT chest 08/04/2017-decrease in right hilar mass and mediastinal lymphadenopathy  CT chest 12/03/2017-increased right hilar mass and mediastinal adenopathy. Extensive consolidation throughout the right lung, subpleural consolidation in the medial left upper lobe  Bronchoscopy/EBUS1/20/2019, no endobronchial tumor seen, brushings/biopsies from the right upper lobe and biopsies from multiple lymph nodes revealed "atypical cells ", nondiagnostic of malignancy  PET scan 03/20/5624- hypermetabolic mediastinal lymphadenopathy, hypermetabolic lung lesions, hypermetabolic periportal node, postobstructive pneumonitis versus tumor spread in the right lung, hypermetabolic right hilar mass  CT biopsy right upper lung mass 01/07/2018-non-necrotizing granulomatous inflammation, AFB and fungal stains negative 2. Renal insufficiency 3. 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  The CT biopsy of the right upper lung masslike consolidation returned negative for malignancy.  He is undergone multiple biopsies without a diagnosis of malignancy.  I discussed the differential diagnosis with [name].  We discussed obtaining a ACE level to look for evidence of sarcoidosis.  He would like to wait on this until he sees pulmonary medicine.  I will make referral to Dr. Elsworth Soho.  He will return for an office visit in 4 months.  I am available to see him sooner as needed.  25 minutes were spent with the patient today.  The majority of the time was used for counseling and coordination of care. Betsy Coder, MD  01/14/2018  12:19 PM

## 2018-01-15 ENCOUNTER — Telehealth: Payer: Self-pay

## 2018-01-15 ENCOUNTER — Other Ambulatory Visit: Payer: Self-pay | Admitting: Family Medicine

## 2018-01-15 NOTE — Telephone Encounter (Signed)
Pt has been scheduled for 01/19/18 at 10:00- okay to use 37min slot per RA. Pt is aware of apt time/date. Nothing further is needed.

## 2018-01-15 NOTE — Telephone Encounter (Signed)
-----   Message from Rigoberto Noel, MD sent at 01/15/2018  6:94 AM EST ----- Certainly can . Michoel Kunin, can we get him in at the next opening as a new consult please?  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  He most recently had a CT biopsy of a masslike area in the right lung which revealed non-necrotizing granulomatous inflammation.  He is now almost 1 year out from presentation with a right hilar mass and mediastinal adenopathy.  I suspect he may not have a malignancy.  Can you see him soon to discuss the differential diagnosis?  Thanks for seeing him  Leroy Sea

## 2018-01-19 ENCOUNTER — Other Ambulatory Visit: Payer: PPO

## 2018-01-19 ENCOUNTER — Ambulatory Visit: Payer: PPO | Admitting: Pulmonary Disease

## 2018-01-19 ENCOUNTER — Encounter: Payer: Self-pay | Admitting: Pulmonary Disease

## 2018-01-19 VITALS — BP 110/70 | HR 101 | Ht 72.0 in | Wt 198.0 lb

## 2018-01-19 DIAGNOSIS — E0822 Diabetes mellitus due to underlying condition with diabetic chronic kidney disease: Secondary | ICD-10-CM

## 2018-01-19 DIAGNOSIS — R590 Localized enlarged lymph nodes: Secondary | ICD-10-CM | POA: Diagnosis not present

## 2018-01-19 MED ORDER — PREDNISONE 5 MG PO TABS: ORAL_TABLET | ORAL | 0 refills | Status: DC

## 2018-01-19 NOTE — Addendum Note (Signed)
Addended by: Valerie Salts on: 01/19/2018 10:48 AM   Modules accepted: Orders

## 2018-01-19 NOTE — Assessment & Plan Note (Signed)
Concern for sugars rising with steroids. He will check his sugars at least 2-3 times a week for the next 2 weeks and call back to report

## 2018-01-19 NOTE — Patient Instructions (Signed)
We discussed biopsy finding of non-necrotizing granulomas and possibly sarcoidosis.  Blood work today. Schedule PFTs.  Start 20 mg of prednisone daily after breakfast ,  call me back in 2 weeks to report  blood sugars  After 1 month, we will try to cut down

## 2018-01-19 NOTE — Assessment & Plan Note (Signed)
We discussed biopsy finding of non-necrotizing granulomas and possibly sarcoidosis. Check ACE level Schedule PFTs.  We will undertake therapeutic trial with steroids-confounding factor would be that radiation pneumonitis would also respond but would expect sarcoidosis to respond to a greater degree  Start 20 mg of prednisone daily after breakfast ,  call me back in 2 weeks to report  blood sugars  After 1 month, we will try to cut down

## 2018-01-19 NOTE — Progress Notes (Signed)
   Subjective:    Patient ID: Ruben Conner, male    DOB: June 09, 1930, 84 y.o.   MRN: [id number]  HPI  Chief Complaint  Patient presents with  . Follow-up    Follow up for lung mass. Patient states his breathing has been ok since last visit. Has been ruled out for having lung cancer or TB. Is here today for further testing.    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  Biopsies were negative, after multidisciplinary conference, due to narrowing of right upper lobe bronchus, he was started on empiric radiation 04/2017. Follow-up imaging showed increased bilateral hilar lymphadenopathy and right upper lobe changes of radiation pneumonitis. He finally underwent CT biopsy of RUL lung mass 01/07/18 >>non-necrotizing granulomatous inflammation  He has remained asymptomatic throughout this, denies cough, hemoptysis or wheezing he has past medical history of hypertension, diabetes and hyperlipidemia and CKD ([place] )   Significant tests/ events reviewed  CT chest without contrast, since his creatinine was high at 2.4 on 02/26/17. This showed a right hilar soft tissue mass 6.64.8 cm which narrowed the superior vena cava and the right upper lobe bronchus. Scattered mediastinal lymph nodes were noted largest precarinal lymph node measuring 13 mm. A right lower lobe subpleural nodule about 5 mm was noted there was also a calcified granuloma in the right upper lobe.  ? PET scan November 04, 202018-hypermetabolic medial right upper lobe mass, hypermetabolic mediastinal lymph nodes, hypermetabolic left parotid nodule ? Bronchoscopy 03/23/2017-normal airways, EBUS biopsy of a right paratracheal node in the right upper lung with bronchial brushings/washings returned nondiagnostic ? Bronchoscopy/EBUSand mediastinoscopy 04/08/2017-multiple cytologies and lymph node biopsies revealed no  malignancy  Bronchoscopy/EBUS1/20/2019, no endobronchial tumor seen, brushings/biopsies from the right upper lobe and biopsies from multiple lymph nodes revealed "atypical cells ", nondiagnostic of malignancy  Past Medical History:  Diagnosis Date  . Cancer ([place])    lung  . Diabetes mellitus without complication ([place])   . Hyperlipidemia   . Hypertension   . Irregular heart rate    Dr. Victorino December note from 04/2017 states bradycardia, LBBB  . Pneumonia   . Renal insufficiency       Review of Systems neg for any significant sore throat, dysphagia, itching, sneezing, nasal congestion or excess/ purulent secretions, fever, chills, sweats, unintended wt loss, pleuritic or exertional cp, hempoptysis, orthopnea pnd or change in chronic leg swelling. Also denies presyncope, palpitations, heartburn, abdominal pain, nausea, vomiting, diarrhea or change in bowel or urinary habits, dysuria,hematuria, rash, arthralgias, visual complaints, headache, numbness weakness or ataxia.     Objective:   Physical Exam  Gen. Pleasant, well-nourished, in no distress ENT - no thrush, no post nasal drip Neck: No JVD, no thyromegaly, no carotid bruits Lungs: no use of accessory muscles, no dullness to percussion, clear without rales or rhonchi  Cardiovascular: Rhythm regular, heart sounds  normal, no murmurs or gallops, no peripheral edema Musculoskeletal: No deformities, no cyanosis or clubbing        Assessment & Plan:

## 2018-01-20 LAB — ANGIOTENSIN CONVERTING ENZYME

## 2018-01-25 ENCOUNTER — Other Ambulatory Visit: Payer: Self-pay | Admitting: Family Medicine

## 2018-01-25 MED ORDER — GLUCOSE BLOOD VI STRP: ORAL_STRIP | 12 refills | Status: DC

## 2018-01-25 NOTE — Telephone Encounter (Signed)
Rx sent to pharmacy   

## 2018-02-02 ENCOUNTER — Telehealth: Payer: Self-pay | Admitting: Pulmonary Disease

## 2018-02-02 NOTE — Telephone Encounter (Signed)
Spoke with the pt and notified of recs per RA  He verbalized understanding

## 2018-02-02 NOTE — Telephone Encounter (Signed)
Sugars seem to be low, tolerating steroids well, Continue 20 mg of prednisone until his next visit with [place] in April

## 2018-02-02 NOTE — Telephone Encounter (Signed)
Spoke with pt, he stated his BS results are: RA please advise.   3/19 57 3/18 51 3/17 56 3/15 52 3/14 61 3/13 53 3/12 121

## 2018-02-23 ENCOUNTER — Ambulatory Visit: Payer: PPO | Admitting: Adult Health

## 2018-02-23 ENCOUNTER — Encounter: Payer: Self-pay | Admitting: Adult Health

## 2018-02-23 ENCOUNTER — Ambulatory Visit (INDEPENDENT_AMBULATORY_CARE_PROVIDER_SITE_OTHER): Payer: PPO | Admitting: Pulmonary Disease

## 2018-02-23 ENCOUNTER — Ambulatory Visit (INDEPENDENT_AMBULATORY_CARE_PROVIDER_SITE_OTHER)
Admission: RE | Admit: 2018-02-23 | Discharge: 2018-02-23 | Disposition: A | Discharge status: Home / Self Care | Payer: PPO | Source: Ambulatory Visit | Attending: Adult Health | Admitting: Adult Health

## 2018-02-23 VITALS — BP 136/70 | HR 70 | Ht 70.0 in | Wt 202.6 lb

## 2018-02-23 DIAGNOSIS — R600 Localized edema: Secondary | ICD-10-CM | POA: Diagnosis not present

## 2018-02-23 DIAGNOSIS — D869 Sarcoidosis, unspecified: Secondary | ICD-10-CM

## 2018-02-23 DIAGNOSIS — C3490 Malignant neoplasm of unspecified part of unspecified bronchus or lung: Secondary | ICD-10-CM | POA: Diagnosis not present

## 2018-02-23 DIAGNOSIS — R609 Edema, unspecified: Secondary | ICD-10-CM | POA: Insufficient documentation

## 2018-02-23 DIAGNOSIS — R590 Localized enlarged lymph nodes: Secondary | ICD-10-CM

## 2018-02-23 DIAGNOSIS — I10 Essential (primary) hypertension: Secondary | ICD-10-CM | POA: Diagnosis not present

## 2018-02-23 LAB — PULMONARY FUNCTION TEST
DL/VA % PRED: 88 %
DL/VA: 4.06 ml/min/mmHg/L
DLCO UNC % PRED: 59 %
DLCO unc: 19.09 ml/min/mmHg
FEF 25-75 Post: 2.51 L/sec
FEF 25-75 Pre: 1.56 L/sec
FEF2575-%Change-Post: 60 %
FEF2575-%PRED-PRE: 92 %
FEF2575-%Pred-Post: 148 %
FEV1-%CHANGE-POST: 11 %
FEV1-%Pred-Post: 99 %
FEV1-%Pred-Pre: 88 %
FEV1-PRE: 2.33 L
FEV1-Post: 2.61 L
FEV1FVC-%Change-Post: 4 %
FEV1FVC-%PRED-PRE: 102 %
FEV6-%Change-Post: 7 %
FEV6-%Pred-Post: 99 %
FEV6-%Pred-Pre: 92 %
FEV6-POST: 3.48 L
FEV6-Pre: 3.25 L
FEV6FVC-%CHANGE-POST: 0 %
FEV6FVC-%PRED-POST: 107 %
FEV6FVC-%Pred-Pre: 107 %
FVC-%CHANGE-POST: 6 %
FVC-%PRED-POST: 92 %
FVC-%Pred-Pre: 86 %
FVC-PRE: 3.26 L
FVC-Post: 3.49 L
POST FEV1/FVC RATIO: 75 %
PRE FEV6/FVC RATIO: 100 %
Post FEV6/FVC ratio: 100 %
Pre FEV1/FVC ratio: 72 %

## 2018-02-23 MED ORDER — PREDNISONE 10 MG PO TABS: 10.0000 mg | ORAL_TABLET | Freq: Every day | ORAL | 1 refills | Status: DC

## 2018-02-23 NOTE — Progress Notes (Signed)
@Patient  ID: Ruben Conner, male    DOB: 05-May-1930, 84 y.o.   MRN: [id number]  Chief Complaint  Patient presents with  . 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  Patient underwent biopsy that returned negative/nondiagnostic.  Patient did have narrowing of the right upper lobe bronchus.  Case was discussed at the multidisciplinary conference and patient was started on empiric radiation June 2018.  Follow-up imaging showed increased bilateral hilar lymphadenopathy and right upper lobe changes of radiation pneumonitis.  Patient underwent CT-guided biopsy of the right upper lung mass January 07, 2018 that showed nonnecrotizing granulomatous inflammation.  TEST  Significant tests/ events reviewed  CT chest without contrast, since his creatinine was high at 2.4 on 02/26/17. This showed a right hilar soft tissue mass 6.64.8 cm which narrowed the superior vena cava and the right upper lobe bronchus. Scattered mediastinal lymph nodes were noted largest precarinal lymph node measuring 13 mm. A right lower lobe subpleural nodule about 5 mm was noted there was also a calcified granuloma in the right upper lobe.  ? PET scan 10-Apr-202018-hypermetabolic medial right upper lobe mass, hypermetabolic mediastinal lymph nodes, hypermetabolic left parotid nodule ? Bronchoscopy 03/23/2017-normal airways, EBUS biopsy of a right paratracheal node in the right upper lung with bronchial brushings/washings returned nondiagnostic ? Bronchoscopy/EBUSand mediastinoscopy 04/08/2017-multiple cytologies and lymph node biopsies revealed no malignancy  Bronchoscopy/EBUS1/20/2019, no endobronchial tumor seen, brushings/biopsies from the right upper lobe and biopsies from multiple lymph nodes revealed "atypical cells ",  nondiagnostic of malignancy     02/23/2018 Follow up : Abnormal CT Chest w/ mediastinal lymphadenopathy Patient returns for a one-month follow-up.  Patient initially presented in April 2018 with abnormal CT chest that showed mediastinal, along with a right hilar mass.  Hilar and precarinal lymphadenopathy patient underwent biopsy that was negative/nondiagnostic.  He did have narrowing of the right bronchus.  Patient was presented at the multidisciplinary clini and empiric radiationc was decided.  Follow-up CT scan showed increase adenopathy.  And right upper lobe changes.  Patient underwent CT-guided biopsy of the right upper lobe.  That came back positive for  Nonnecrotizing granulomas.. Patient was felt to have possible underlying sarcoid and was started on empiric steroids.  ACE level was normal. Started on prednisone 20mg  daily - ran out 3 days ago.  Since last visit patient is feeling okay , no significant change . Has daily cough with intermittent cough/congestion and throat clearing . No dyspnea. Is active and indepenent . 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   PFTs done today were normal with no airflow obstruction or restriction. Except for Diffusing defect FEV1 99%, ratio 75, FVC 92%, DLCO 59%.  11% bronchodilator change.  Mid flow reversibility.  Has noticed ankle swelling since starting prednisone . We discussed a low salt and sweet diet  No orthopnea.       No Known Allergies  Immunization History  Administered Date(s) Administered  . Influenza,inj,Quad PF,6+ Mos 09/18/2016  . Influenza-Unspecified 08/22/2014, 09/05/2015, 09/04/2017    Past Medical History:  Diagnosis Date  . Cancer ([place])    lung  . Diabetes mellitus without complication ([place])   . Hyperlipidemia   . Hypertension   . Irregular heart rate    Dr. Victorino December note from 04/2017 states bradycardia, LBBB  . Pneumonia   . Renal insufficiency  Tobacco History: Social History   Tobacco Use  Smoking Status Former Smoker   . Packs/day: 0.10  . Years: 53.00  . Pack years: 5.30  . Types: Cigarettes  . Last attempt to quit: 11/28/2016  . Years since quitting: 1.2  Smokeless Tobacco Never Used   Counseling given: Not Answered   Outpatient Encounter Medications as of 02/23/2018  Medication Sig  . amLODipine (NORVASC) 5 MG tablet TAKE (1) TABLET TWICE A DAY.  . benazepril (LOTENSIN) 20 MG tablet TAKE (1) TABLET TWICE A DAY.  Marland Kitchen glimepiride (AMARYL) 4 MG tablet TAKE 1 TABLET DAILY  . glucose blood (ONETOUCH VERIO) test strip Twice Daily  . hydrochlorothiazide (HYDRODIURIL) 25 MG tablet Take 25 mg by mouth every morning.   . niacin (NIASPAN) 500 MG CR tablet Take 500-1,000 mg by mouth 2 (two) times daily. 500 in AM and 1000 in evening  . pioglitazone (ACTOS) 30 MG tablet TAKE 1 TABLET DAILY  . potassium chloride SA (K-DUR,KLOR-CON) 20 MEQ tablet TAKE 1 TABLET DAILY  . travoprost, benzalkonium, (TRAVATAN) 0.004 % ophthalmic solution Place 1 drop into both eyes at bedtime.  . predniSONE (DELTASONE) 10 MG tablet Take 1 tablet (10 mg total) by mouth daily with breakfast.  . [DISCONTINUED] predniSONE (DELTASONE) 5 MG tablet Take 4 tablets daily with breakfast. (Patient not taking: Reported on 02/23/2018)   No facility-administered encounter medications on file as of 02/23/2018.      Review of Systems  Constitutional:   No  weight loss, night sweats,  Fevers, chills,  +fatigue, or  lassitude.  HEENT:   No headaches,  Difficulty swallowing,  Tooth/dental problems, or  Sore throat,                No sneezing, itching, ear ache, nasal congestion, post nasal drip,   CV:  No chest pain,  Orthopnea, PND, swelling in lower extremities, anasarca, dizziness, palpitations, syncope.  Neg calf pain  GI  No heartburn, indigestion, abdominal pain, nausea, vomiting, diarrhea, change in bowel habits, loss of appetite, bloody stools.   Resp:    No chest wall deformity  Skin: no rash or lesions.  GU: no dysuria, change in color  of urine, no urgency or frequency.  No flank pain, no hematuria   MS:  No joint pain or swelling.  No decreased range of motion.  No back pain.    Physical Exam  BP 136/70 (BP Location: Right Arm, Cuff Size: Normal)   Pulse 70   Ht 5\' 10"  (1.778 m)   Wt 202 lb 9.6 oz (91.9 kg)   SpO2 100%   BMI 29.07 kg/m   GEN: A/Ox3; pleasant , NAD, elderly    HEENT:  [place]/AT,  EACs-clear, TMs-wnl, NOSE-clear, THROAT-clear, no lesions, no postnasal drip or exudate noted.   NECK:  Supple w/ fair ROM; no JVD; normal carotid impulses w/o bruits; no thyromegaly or nodules palpated; no lymphadenopathy.    RESP  Clear  P & A; w/o, wheezes/ rales/ or rhonchi. no accessory muscle use, no dullness to percussion  CARD:  RRR, no m/r/g, tr -1  peripheral edema, pulses intact, no cyanosis or clubbing. No calf tenderness.  GI:   Soft & nt; nml bowel sounds; no organomegaly or masses detected.   Musco: Warm bil, no deformities or joint swelling noted.   Neuro: alert, no focal deficits noted.    Skin: Warm, no lesions or rashes    Lab Results:  CBC  BMET  BNP with No results found  for: BNP  ProBNP No results found for: PROBNP  Imaging: No results found.   Assessment & Plan:   Mediastinal lymphadenopathy Right hilar mass, with mediastinal adenopathy Status post biopsies that were nondiagnostic/negative with high suspicion of probable cancer status post radiation.  Patient with worsening adenopathy and right upper lobe changes on serial CT follow-up.  Status post CT-guided biopsy of the right upper lobe.  Pathology positive for nonnecrotizing granulomas.  Concerning for sarcoidosis. She has been started on empiric steroids.  Patient has minimum symptoms.  PFT today shows preserved lung function despite heavy smoking and previous radiation patient does have a moderate diffusing defect. Check chest x-ray today lower prednisone dosing to 10 mg daily. Steroid patient education given.  Plan   . Patient Instructions  Restart Prednisone 10 mg daily and hold at this dose  Chest xray today .  Discuss with your family Doctor that ACE inhibitors -Benazepril may aggravate your cough .  Follow up with Dr. Elsworth Soho  In 6- 8 weeks and As needed   Please contact office for sooner follow up if symptoms do not improve or worsen or seek emergency care       Edema Ankle edema-patient denies any orthopnea or increased shortness of breath suspect a side effect of steroid use.  Along with venous insufficiency.  Patient is advised on a low-salt diet.  Keep lower extremities elevated.  Continue on his hydrochlorthiazide.  If swelling worsens will need additional workup.Marland Kitchen  HTN (hypertension) Controlled on current regimen.  Patient does have some daily cough and throat clearing.  Most likely due to underlying lung issue.  However patient is on ACE inhibitor which can aggravate cough.  Advised that if possible might benefit from changing his ACE inhibitor.Rexene Edison, NP 02/23/2018

## 2018-02-23 NOTE — Assessment & Plan Note (Signed)
Controlled on current regimen.  Patient does have some daily cough and throat clearing.  Most likely due to underlying lung issue.  However patient is on ACE inhibitor which can aggravate cough.  Advised that if possible might benefit from changing his ACE inhibitor.Marland Kitchen

## 2018-02-23 NOTE — Assessment & Plan Note (Signed)
Ankle edema-patient denies any orthopnea or increased shortness of breath suspect a side effect of steroid use.  Along with venous insufficiency.  Patient is advised on a low-salt diet.  Keep lower extremities elevated.  Continue on his hydrochlorthiazide.  If swelling worsens will need additional workup.Ruben Conner

## 2018-02-23 NOTE — Patient Instructions (Addendum)
Restart Prednisone 10 mg daily and hold at this dose  Chest xray today .  Discuss with your family Doctor that ACE inhibitors -Benazepril may aggravate your cough .  Follow up with Dr. Elsworth Soho  In 6- 8 weeks and As needed   Please contact office for sooner follow up if symptoms do not improve or worsen or seek emergency care

## 2018-02-23 NOTE — Progress Notes (Signed)
PFT completed 02/23/18  

## 2018-02-23 NOTE — Assessment & Plan Note (Signed)
Right hilar mass, with mediastinal adenopathy Status post biopsies that were nondiagnostic/negative with high suspicion of probable cancer status post radiation.  Patient with worsening adenopathy and right upper lobe changes on serial CT follow-up.  Status post CT-guided biopsy of the right upper lobe.  Pathology positive for nonnecrotizing granulomas.  Concerning for sarcoidosis. She has been started on empiric steroids.  Patient has minimum symptoms.  PFT today shows preserved lung function despite heavy smoking and previous radiation patient does have a moderate diffusing defect. Check chest x-ray today lower prednisone dosing to 10 mg daily. Steroid patient education given.  Plan  . Patient Instructions  Restart Prednisone 10 mg daily and hold at this dose  Chest xray today .  Discuss with your family Doctor that ACE inhibitors -Benazepril may aggravate your cough .  Follow up with Dr. Elsworth Soho  In 6- 8 weeks and As needed   Please contact office for sooner follow up if symptoms do not improve or worsen or seek emergency care

## 2018-03-03 NOTE — Progress Notes (Signed)
Reviewed & agree with plan  

## 2018-03-11 ENCOUNTER — Telehealth: Payer: Self-pay | Admitting: *Deleted

## 2018-03-11 DIAGNOSIS — M25512 Pain in left shoulder: Secondary | ICD-10-CM

## 2018-03-11 NOTE — Telephone Encounter (Signed)
Pt called requesting PT for continued L shoulder pain after fall on 01/24/2018 Pt had previous surgery on shoulder Per Dr Laurance Flatten ok for PT Order entered in epic

## 2018-03-15 ENCOUNTER — Ambulatory Visit: Payer: PPO | Attending: Family Medicine | Admitting: Physical Therapy

## 2018-03-15 ENCOUNTER — Other Ambulatory Visit: Payer: Self-pay

## 2018-03-15 DIAGNOSIS — M6281 Muscle weakness (generalized): Secondary | ICD-10-CM | POA: Diagnosis not present

## 2018-03-15 DIAGNOSIS — M25512 Pain in left shoulder: Secondary | ICD-10-CM | POA: Insufficient documentation

## 2018-03-15 NOTE — Therapy (Signed)
[facility] [place], [place], [address] Phone: [phone]   Fax:  [phone]  Physical Therapy Evaluation  Patient Details  Name: Ruben Conner MRN: [id number] Date of Birth: [birth year] Referring Provider: Redge Gainer, MD   Encounter Date: 03/15/2018  PT End of Session - 03/15/18 1916    Visit Number  1    Number of Visits  12    Date for PT Re-Evaluation  06/13/18    Authorization Type  Progress note every 10th visit; KX modifier after 15th visit    PT Start Time  1300    PT Stop Time  1343    PT Time Calculation (min)  43 min    Activity Tolerance  Patient tolerated treatment well    Behavior During Therapy  [facility] for tasks assessed/performed       Past Medical History:  Diagnosis Date  . Cancer ([place])    lung  . Diabetes mellitus without complication ([place])   . Hyperlipidemia   . Hypertension   . Irregular heart rate    Dr. Victorino December note from 04/2017 states bradycardia, LBBB  . Pneumonia   . Renal insufficiency     Past Surgical History:  Procedure Laterality Date  . cyst removed    . ENDOBRONCHIAL ULTRASOUND Bilateral 03/23/2017   Procedure: ENDOBRONCHIAL ULTRASOUND;  Surgeon: Rigoberto Noel, MD;  Location: [facility];  Service: Cardiopulmonary;  Laterality: Bilateral;  . EYE SURGERY    . MEDIASTINOSCOPY N/A 04/08/2017   Procedure: MEDIASTINOSCOPY;  Surgeon: Grace Isaac, MD;  Location: [facility];  Service: Thoracic;  Laterality: N/A;  . MOUTH SURGERY    . ROTATOR CUFF REPAIR    . tumor remoced from neck    . VIDEO BRONCHOSCOPY WITH ENDOBRONCHIAL ULTRASOUND N/A 04/08/2017   Procedure: VIDEO BRONCHOSCOPY WITH ENDOBRONCHIAL ULTRASOUND;  Surgeon: Grace Isaac, MD;  Location: [facility];  Service: Thoracic;  Laterality: N/A;  . VIDEO BRONCHOSCOPY WITH ENDOBRONCHIAL ULTRASOUND N/A 12/08/2017   Procedure: VIDEO BRONCHOSCOPY with BRONCHIAL BIOPSIES AND ENDOBRONCHIAL ULTRASOUND WITH TRANSBRONCHIAL AND  BRONCHIAL BIOPSIES;  Surgeon: Grace Isaac, MD;  Location: [facility];  Service: Thoracic;  Laterality: N/A;    There were no vitals filed for this visit.   Subjective Assessment - 03/15/18 1911    Subjective  Patient arrives to physical therapy with left shoulder pain after a fall early March 2019. 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 Patient reports a loss of motion and global shoulder pain. Patient stated movements laterally are the most difficult and cause 5-6/10 pain. Patient stated pain can be at a 0/10 with rest and very minimal motion. Patient stated he has tried to perform shoulder exercises provided from previous PT session for RTC tear however patient did not see any improvement. Patient reported difficulties with ADLs, with home activities, and caring for his wife.    Pertinent History  HTN, DM, Bradycardia, h/o sarcodosis    Limitations  Lifting;House hold activities    Diagnostic tests  none    Patient Stated Goals  get rid of pain and move better with more strength    Currently in Pain?  Yes    Pain Score  5     Pain Location  Shoulder    Pain Orientation  Left    Pain Descriptors / Indicators  Sore    Pain Type  Chronic  pain    Pain Onset  More than a month ago    Pain Frequency  Intermittent    Aggravating Factors   lateral movements, reaching    Pain Relieving Factors  rest, no movement    Effect of Pain on Daily Activities  ADLs, caring for wife         [facility] PT Assessment - 03/15/18 0001      Assessment   Medical Diagnosis  Left shoulder pain, unspecified chronicity    Referring Provider  Redge Gainer, MD    Onset Date/Surgical Date  -- March 2019    Hand Dominance  Right    Next MD Visit  May    Prior Therapy  yes, previous Left [place] residence    Living Arrangements  Spouse/significant  other      Prior Function   Level of [place]  Retired      Art therapist   Posture/Postural Control  Postural limitations    Postural Limitations  Rounded Shoulders;Forward head elevated Left shoulder      ROM / Strength   AROM / PROM / Strength  AROM;PROM;Strength      AROM   Overall AROM Comments  L functional IR to T12; L functional ER: unable to touch head; Right shoulder WFL    AROM Assessment Site  Shoulder    Right/Left Shoulder  Left    Left Shoulder Flexion  50 Degrees (+) Pain    Left Shoulder ABduction  70 Degrees  (+) pain    Left Shoulder External Rotation  35 Degrees at 0 degrees abduction      PROM   Overall PROM Comments  (+) pain at end range    PROM Assessment Site  Shoulder    Right/Left Shoulder  Left    Left Shoulder Flexion  115 Degrees    Left Shoulder ABduction  105 Degrees    Left Shoulder External Rotation  70 Degrees      Strength   Strength Assessment Site  Shoulder    Right/Left Shoulder  Left    Left Shoulder Flexion  2+/5 <50% available range    Left Shoulder ABduction  2+/5 <50% available range    Left Shoulder Internal Rotation  3/5    Left Shoulder External Rotation  4-/5      Palpation   Palpation comment  No tenderness to palpation along shoulder complex      Special Tests    Special Tests  Rotator Cuff Impingement;Sacrolliac Tests    Rotator Cuff Impingment tests  Drop Arm test      Drop Arm test   Findings  Positive    Side  Left    Comment  possibly secondary to poor muscle eccentric control secondary to pain                Objective measurements completed on examination: See above findings.              PT Education - 03/15/18 1915    Education provided  Yes    Education Details  scapular retractions, Scaption AAROM with cane, corner stretch    Person(s) Educated  Patient    Methods  Explanation;Demonstration;Handout    Comprehension  Verbalized  understanding;Returned demonstration          PT Long Term Goals - 03/15/18 1931      PT LONG  TERM GOAL #1   Title  Patient will be independent with HEP    Time  6    Period  Weeks    Status  New      PT LONG TERM GOAL #2   Title  Patient will report less than 3/10 pain in left shoulder with ADLs.     Time  6    Period  Weeks    Status  New      PT LONG TERM GOAL #3   Title  Patient will improve left shoulder flexion AROM to 115 or greater to improve functional activities.    Time  6    Period  Weeks    Status  New      PT LONG TERM GOAL #4   Title  Patient will improve L shoulder MMT to 4/5 or greater to improve ability to perform functional activities and care for wife.    Time  6    Period  Weeks    Status  New      PT LONG TERM GOAL #5   Title  Patient will improve L functional ER to top of head in order to wash and style hair.    Time  6    Period  Weeks    Status  New             Plan - 03/15/18 1917    Clinical Impression Statement  Patient is an 84 year old male who presents to physical therapy with decreased left shoulder AROM, strength with associated pain. Patient demonstrated [facility] PROM with slight increase of pain at end range. Patient noted with rounded, forward and elevated left shoulder in sitting and standing. No tenderness to palpation at left shoulder. Patient (+) for codman's drop arm test with pain which may be indicative of a supraspinatus tear or poor muscular eccentric control secondary to pain. Patient may benefit from skilled physical therapy to address deficits and to address patient's goals.     Clinical Presentation  Evolving    Clinical Presentation due to:  not improving    Clinical Decision Making  Moderate    Rehab Potential  Good    PT Frequency  2x / week    PT Duration  6 weeks    PT Treatment/Interventions  Iontophoresis 4mg /ml Dexamethasone;Moist Heat;Traction;Cryotherapy;Electrical Stimulation;Therapeutic  activities;Neuromuscular re-education;Patient/family education;Taping;Manual techniques;Passive range of motion    PT Next Visit Plan  UBE, pulleys, UE ranger, LE strengthening, modalities PRN for pain relief.    PT Home Exercise Plan  scapular retractions, AAROM scaption with cane, corner stretch    Consulted and Agree with Plan of Care  Patient       Patient will benefit from skilled therapeutic intervention in order to improve the following deficits and impairments:  Decreased activity tolerance, Decreased strength, Decreased range of motion, Impaired UE functional use, Postural dysfunction, Pain  Visit Diagnosis: Left shoulder pain, unspecified chronicity  Muscle weakness (generalized)     Problem List Patient Active Problem List   Diagnosis Date Noted  . Edema 02/23/2018  . Primary cancer of right upper lobe of lung ([place])   . Mediastinal lymphadenopathy 03/10/2017  . Anemia in stage 4 chronic kidney disease ([place]) 09/18/2016  . Vitamin D deficiency 09/18/2016  . Sinus bradycardia 08/31/2014  . First degree AV block 08/31/2014  . LBBB (left bundle branch block) 08/31/2014  . HTN (hypertension) 09/20/2013  . Diabetes ([place]) 05/02/2013  . Hyperlipemia 05/02/2013    Gabriela Eves,  PT, DPT 03/15/2018, 7:40 PM  [facility] [place], [place], [address] Phone: [phone]   Fax:  [phone]  Name: JAYQUON THEILER MRN: [id number] Date of Birth: Jan 26, 1930

## 2018-03-16 LAB — HM DIABETES EYE EXAM

## 2018-03-18 ENCOUNTER — Ambulatory Visit: Payer: PPO | Attending: Family Medicine | Admitting: Physical Therapy

## 2018-03-18 DIAGNOSIS — M25512 Pain in left shoulder: Secondary | ICD-10-CM | POA: Diagnosis not present

## 2018-03-18 DIAGNOSIS — M6281 Muscle weakness (generalized): Secondary | ICD-10-CM | POA: Insufficient documentation

## 2018-03-18 NOTE — Therapy (Signed)
[facility] [place], [place], [address] Phone: [phone]   Fax:  [phone]  Physical Therapy Treatment  Patient Details  Name: Ruben Conner MRN: [id number] Date of Birth: [birth year] Referring Provider: Redge Gainer, MD   Encounter Date: 03/18/2018  PT End of Session - 03/18/18 1308    Visit Number  2    Number of Visits  12    Date for PT Re-Evaluation  06/13/18    Authorization Type  Progress note every 10th visit; KX modifier after 15th visit    PT Start Time  1301    PT Stop Time  1355    PT Time Calculation (min)  54 min    Activity Tolerance  Patient tolerated treatment well    Behavior During Therapy  [facility] for tasks assessed/performed       Past Medical History:  Diagnosis Date  . Cancer ([place])    lung  . Diabetes mellitus without complication ([place])   . Hyperlipidemia   . Hypertension   . Irregular heart rate    Dr. Victorino December note from 04/2017 states bradycardia, LBBB  . Pneumonia   . Renal insufficiency     Past Surgical History:  Procedure Laterality Date  . cyst removed    . ENDOBRONCHIAL ULTRASOUND Bilateral 03/23/2017   Procedure: ENDOBRONCHIAL ULTRASOUND;  Surgeon: Rigoberto Noel, MD;  [facility]: [facility];  Service: Cardiopulmonary;  Laterality: Bilateral;  . EYE SURGERY    . MEDIASTINOSCOPY N/A 04/08/2017   Procedure: MEDIASTINOSCOPY;  Surgeon: Grace Isaac, MD;  Location: [place];  Service: Thoracic;  Laterality: N/A;  . MOUTH SURGERY    . ROTATOR CUFF REPAIR    . tumor remoced from neck    . VIDEO BRONCHOSCOPY WITH ENDOBRONCHIAL ULTRASOUND N/A 04/08/2017   Procedure: VIDEO BRONCHOSCOPY WITH ENDOBRONCHIAL ULTRASOUND;  Surgeon: Grace Isaac, MD;  Location: [place];  Service: Thoracic;  Laterality: N/A;  . VIDEO BRONCHOSCOPY WITH ENDOBRONCHIAL ULTRASOUND N/A 12/08/2017   Procedure: VIDEO BRONCHOSCOPY with BRONCHIAL BIOPSIES AND ENDOBRONCHIAL ULTRASOUND WITH TRANSBRONCHIAL AND BRONCHIAL  BIOPSIES;  Surgeon: Grace Isaac, MD;  Location: [place];  Service: Thoracic;  Laterality: N/A;    There were no vitals filed for this visit.  Subjective Assessment - 03/18/18 1307    Subjective  Patient arrives with no new complaints.    Pertinent History  HTN, DM, Bradycardia, h/o sarcodosis    Limitations  Lifting;House hold activities    Diagnostic tests  none    Patient Stated Goals  get rid of pain and move better with more strength    Currently in Pain?  Yes    Pain Score  2     Pain Orientation  Left    Pain Descriptors / Indicators  Sore    Pain Type  Chronic pain    Pain Onset  More than a month ago         [facility] PT Assessment - 03/18/18 0001      Assessment   Medical Diagnosis  Left shoulder pain, unspecified chronicity    Hand Dominance  Right    Next MD Visit  May    Prior Therapy  yes, previous Left RTC                    [facility] Adult PT Treatment/Exercise - 03/18/18 0001      Exercises   Exercises  Shoulder      Shoulder Exercises: Seated   Row  Strengthening;Left;20  reps;Theraband    Theraband Level (Shoulder Row)  Level 1 (Yellow)    Protraction  AAROM;Both;20 reps    External Rotation  AROM;Both;20 reps    Other Seated Exercises  bicep curls x20 2#    Other Seated Exercises  UE ranger flexion x3 minutes, clockwise/counter clockwise x3 minutes      Shoulder Exercises: Standing   Protraction  Left;5 reps;Theraband    Theraband Level (Shoulder Protraction)  Level 1 (Yellow)    Internal Rotation  Strengthening;Left;20 reps;Theraband    Theraband Level (Shoulder Internal Rotation)  Level 1 (Yellow)    Extension  Strengthening;Left;20 reps;Theraband    Theraband Level (Shoulder Extension)  Level 1 (Yellow)      Shoulder Exercises: Pulleys   Flexion  3 minutes      Modalities   Modalities  Moist Heat;Electrical Stimulation      Moist Heat Therapy   Number Minutes Moist Heat  15 Minutes    Moist Heat Location  Shoulder       Electrical Stimulation   Electrical Stimulation Location  Left shoulder    Electrical Stimulation Action  IFC    Electrical Stimulation Parameters  80-150 hz x15 minutes    Electrical Stimulation Goals  Pain                  PT Long Term Goals - 03/15/18 1931      PT LONG TERM GOAL #1   Title  Patient will be independent with HEP    Time  6    Period  Weeks    Status  New      PT LONG TERM GOAL #2   Title  Patient will report less than 3/10 pain in left shoulder with ADLs.     Time  6    Period  Weeks    Status  New      PT LONG TERM GOAL #3   Title  Patient will improve left shoulder flexion AROM to 115 or greater to improve functional activities.    Time  6    Period  Weeks    Status  New      PT LONG TERM GOAL #4   Title  Patient will improve L shoulder MMT to 4/5 or greater to improve ability to perform functional activities and care for wife.    Time  6    Period  Weeks    Status  New      PT LONG TERM GOAL #5   Title  Patient will improve L functional ER to top of head in order to wash and style hair.    Time  6    Period  Weeks    Status  New            Plan - 03/18/18 1512    Clinical Impression Statement  Patient was able to tolerate treatment with multiple rest breaks secondary to pain. MPT and DPT assessed patient's shoulder and found increased atrophy of the left infraspinatus muscle in comparison to right. Patient also found with inability to eccentrically lower left UE from about 45 degreees of abduction without pain. Patient educated goals for therapy are to improve function, decrease pain, and improve strength. Patient reported understanding. No adverse effects noted upon removal of e-stim and moist heat pack.    Clinical Presentation  Evolving    Clinical Decision Making  Moderate    Rehab Potential  Good    PT Frequency  2x / week  PT Duration  6 weeks    PT Treatment/Interventions  Iontophoresis 4mg /ml Dexamethasone;Moist  Heat;Traction;Cryotherapy;Electrical Stimulation;Therapeutic activities;Neuromuscular re-education;Patient/family education;Taping;Manual techniques;Passive range of motion    PT Next Visit Plan  pulleys, UE ranger, LE strengthening, modalities PRN for pain relief.    Consulted and Agree with Plan of Care  Patient       Patient will benefit from skilled therapeutic intervention in order to improve the following deficits and impairments:  Decreased activity tolerance, Decreased strength, Decreased range of motion, Impaired UE functional use, Postural dysfunction, Pain  Visit Diagnosis: Left shoulder pain, unspecified chronicity  Muscle weakness (generalized)     Problem List Patient Active Problem List   Diagnosis Date Noted  . Edema 02/23/2018  . Primary cancer of right upper lobe of lung ([place])   . Mediastinal lymphadenopathy 03/10/2017  . Anemia in stage 4 chronic kidney disease ([place]) 09/18/2016  . Vitamin D deficiency 09/18/2016  . Sinus bradycardia 08/31/2014  . First degree AV block 08/31/2014  . LBBB (left bundle branch block) 08/31/2014  . HTN (hypertension) 09/20/2013  . Diabetes ([place]) 05/02/2013  . Hyperlipemia 05/02/2013    Gabriela Eves, PT, DPT 03/18/2018, 3:21 PM  [facility] [address] [place], [place], [address] Phone: [phone]   Fax:  [phone]  Name: Ruben Conner MRN: [id number] Date of Birth: 05-Feb-1930

## 2018-03-22 ENCOUNTER — Ambulatory Visit: Payer: PPO | Admitting: Physical Therapy

## 2018-03-22 DIAGNOSIS — M6281 Muscle weakness (generalized): Secondary | ICD-10-CM

## 2018-03-22 DIAGNOSIS — M25512 Pain in left shoulder: Secondary | ICD-10-CM | POA: Diagnosis not present

## 2018-03-22 NOTE — Therapy (Signed)
[facility] [place], [place], [address] Phone: [phone]   Fax:  [phone]  Physical Therapy Treatment  Patient Details  Name: Ruben Conner MRN: [id number] Date of Birth: [birth year] Referring Provider: Redge Gainer, MD   Encounter Date: 03/22/2018  PT End of Session - 03/22/18 1303    Visit Number  3    Number of Visits  12    Date for PT Re-Evaluation  06/13/18    Authorization Type  Progress note every 10th visit; KX modifier after 15th visit    PT Start Time  1301    Activity Tolerance  Patient tolerated treatment well    Behavior During Therapy  [facility] for tasks assessed/performed       Past Medical History:  Diagnosis Date  . Cancer ([place])    lung  . Diabetes mellitus without complication ([place])   . Hyperlipidemia   . Hypertension   . Irregular heart rate    Dr. Victorino December note from 04/2017 states bradycardia, LBBB  . Pneumonia   . Renal insufficiency     Past Surgical History:  Procedure Laterality Date  . cyst removed    . ENDOBRONCHIAL ULTRASOUND Bilateral 03/23/2017   Procedure: ENDOBRONCHIAL ULTRASOUND;  Surgeon: Rigoberto Noel, MD;  [facility];  Service: Cardiopulmonary;  Laterality: Bilateral;  . EYE SURGERY    . MEDIASTINOSCOPY N/A 04/08/2017   Procedure: MEDIASTINOSCOPY;  Surgeon: Grace Isaac, MD;  Location: [place];  Service: Thoracic;  Laterality: N/A;  . MOUTH SURGERY    . ROTATOR CUFF REPAIR    . tumor remoced from neck    . VIDEO BRONCHOSCOPY WITH ENDOBRONCHIAL ULTRASOUND N/A 04/08/2017   Procedure: VIDEO BRONCHOSCOPY WITH ENDOBRONCHIAL ULTRASOUND;  Surgeon: Grace Isaac, MD;  Location: [place];  Service: Thoracic;  Laterality: N/A;  . VIDEO BRONCHOSCOPY WITH ENDOBRONCHIAL ULTRASOUND N/A 12/08/2017   Procedure: VIDEO BRONCHOSCOPY with BRONCHIAL BIOPSIES AND ENDOBRONCHIAL ULTRASOUND WITH TRANSBRONCHIAL AND BRONCHIAL BIOPSIES;  Surgeon: Grace Isaac, MD;  Location: [place];   Service: Thoracic;  Laterality: N/A;    There were no vitals filed for this visit.  Subjective Assessment - 03/22/18 1342    Subjective  Patient reported he was able to lift arm out his window while he was at the drive [employer] today.    Pertinent History  HTN, DM, Bradycardia, h/o sarcodosis    Limitations  Lifting;House hold activities    Diagnostic tests  none    Patient Stated Goals  get rid of pain and move better with more strength    Currently in Pain?  No/denies                       [facility] Adult PT Treatment/Exercise - 03/22/18 0001      Shoulder Exercises: Seated   Abduction  AAROM;Left;20 reps with PVC pipe in Scaption plane    Other Seated Exercises  UE ranger flexion, clockwise/counter clockwise x3 minutes each      Shoulder Exercises: Standing   Internal Rotation  Strengthening;Left;20 reps;Theraband    Theraband Level (Shoulder Internal Rotation)  Level 1 (Yellow)    Extension  Strengthening;Left;20 reps;Theraband    Theraband Level (Shoulder Extension)  Level 1 (Yellow)    Row  Strengthening;20 reps;Theraband    Theraband Level (Shoulder Row)  Level 1 (Yellow)      Shoulder Exercises: Pulleys   Flexion  3 minutes      Shoulder Exercises: ROM/Strengthening   UBE (  Upper Arm Bike)  120 RPM x6' (3 fwd. 3 bwd)      Moist Heat Therapy   Number Minutes Moist Heat  15 Minutes    Moist Heat Location  Shoulder      Electrical Stimulation   Electrical Stimulation Location  Left Shoulder    Electrical Stimulation Action  IFC    Electrical Stimulation Parameters  80-150 hz x15 minutes    Electrical Stimulation Goals  Pain                  PT Long Term Goals - 03/15/18 1931      PT LONG TERM GOAL #1   Title  Patient will be independent with HEP    Time  6    Period  Weeks    Status  New      PT LONG TERM GOAL #2   Title  Patient will report less than 3/10 pain in left shoulder with ADLs.     Time  6    Period  Weeks    Status   New      PT LONG TERM GOAL #3   Title  Patient will improve left shoulder flexion AROM to 115 or greater to improve functional activities.    Time  6    Period  Weeks    Status  New      PT LONG TERM GOAL #4   Title  Patient will improve L shoulder MMT to 4/5 or greater to improve ability to perform functional activities and care for wife.    Time  6    Period  Weeks    Status  New      PT LONG TERM GOAL #5   Title  Patient will improve L functional ER to top of head in order to wash and style hair.    Time  6    Period  Weeks    Status  New            Plan - 03/22/18 1338    Clinical Impression Statement  Patient was able to tolerate treatment well with some reports of muscle tightness. Patient unable to perform ER with band at this time secondary to pain. Patient reported some muscle fatigue with AROM with ER; patient was able to complete set with rest. Patient noted with improvement with function and pain levels since the start of PT. Normal response to modalities upon removal.    Clinical Presentation  Evolving    Clinical Decision Making  Moderate    Rehab Potential  Good    PT Frequency  2x / week    PT Duration  6 weeks    PT Treatment/Interventions  Iontophoresis 4mg /ml Dexamethasone;Moist Heat;Traction;Cryotherapy;Electrical Stimulation;Therapeutic activities;Neuromuscular re-education;Patient/family education;Taping;Manual techniques;Passive range of motion    PT Next Visit Plan  pulleys, UE ranger, LE strengthening, modalities PRN for pain relief.    Consulted and Agree with Plan of Care  Patient       Patient will benefit from skilled therapeutic intervention in order to improve the following deficits and impairments:  Decreased activity tolerance, Decreased strength, Decreased range of motion, Impaired UE functional use, Postural dysfunction, Pain  Visit Diagnosis: Left shoulder pain, unspecified chronicity  Muscle weakness (generalized)     Problem  List Patient Active Problem List   Diagnosis Date Noted  . Edema 02/23/2018  . Primary cancer of right upper lobe of lung ([place])   . Mediastinal lymphadenopathy 03/10/2017  . Anemia in stage  4 chronic kidney disease ([place]) 09/18/2016  . Vitamin D deficiency 09/18/2016  . Sinus bradycardia 08/31/2014  . First degree AV block 08/31/2014  . LBBB (left bundle branch block) 08/31/2014  . HTN (hypertension) 09/20/2013  . Diabetes ([place]) 05/02/2013  . Hyperlipemia 05/02/2013    Gabriela Eves, PT, DPT 03/22/2018, 1:48 PM  [facility] [address] [place], [place], [address] Phone: [phone]   Fax:  [phone]  Name: TORETTO TINGLER MRN: [id number] Date of Birth: Feb 09, 1930

## 2018-03-25 ENCOUNTER — Ambulatory Visit: Payer: PPO | Admitting: Physical Therapy

## 2018-03-25 DIAGNOSIS — M6281 Muscle weakness (generalized): Secondary | ICD-10-CM

## 2018-03-25 DIAGNOSIS — M25512 Pain in left shoulder: Secondary | ICD-10-CM | POA: Diagnosis not present

## 2018-03-25 NOTE — Therapy (Signed)
[facility] [place], [place], [address] Phone: [phone]   Fax:  [phone]  Physical Therapy Treatment  Patient Details  Name: Ruben Conner MRN: [id number] Date of Birth: Mar 26, 1930 Referring Provider: Redge Gainer, MD   Encounter Date: 03/25/2018  PT End of Session - 03/25/18 1359    Visit Number  4    Number of Visits  12    Date for PT Re-Evaluation  06/13/18    Authorization Type  Progress note every 10th visit; KX modifier after 15th visit    PT Start Time  1345    PT Stop Time  1433    PT Time Calculation (min)  48 min    Activity Tolerance  Patient tolerated treatment well    Behavior During Therapy  [facility] for tasks assessed/performed       Past Medical History:  Diagnosis Date  . Cancer ([place])    lung  . Diabetes mellitus without complication ([place])   . Hyperlipidemia   . Hypertension   . Irregular heart rate    Dr. Victorino December note from 04/2017 states bradycardia, LBBB  . Pneumonia   . Renal insufficiency     Past Surgical History:  Procedure Laterality Date  . cyst removed    . ENDOBRONCHIAL ULTRASOUND Bilateral 03/23/2017   Procedure: ENDOBRONCHIAL ULTRASOUND;  Surgeon: Rigoberto Noel, MD;  [facility];  Service: Cardiopulmonary;  Laterality: Bilateral;  . EYE SURGERY    . MEDIASTINOSCOPY N/A 04/08/2017   Procedure: MEDIASTINOSCOPY;  Surgeon: Grace Isaac, MD;  Location: [place];  Service: Thoracic;  Laterality: N/A;  . MOUTH SURGERY    . ROTATOR CUFF REPAIR    . tumor remoced from neck    . VIDEO BRONCHOSCOPY WITH ENDOBRONCHIAL ULTRASOUND N/A 04/08/2017   Procedure: VIDEO BRONCHOSCOPY WITH ENDOBRONCHIAL ULTRASOUND;  Surgeon: Grace Isaac, MD;  Location: [place];  Service: Thoracic;  Laterality: N/A;  . VIDEO BRONCHOSCOPY WITH ENDOBRONCHIAL ULTRASOUND N/A 12/08/2017   Procedure: VIDEO BRONCHOSCOPY with BRONCHIAL BIOPSIES AND ENDOBRONCHIAL ULTRASOUND WITH TRANSBRONCHIAL AND BRONCHIAL  BIOPSIES;  Surgeon: Grace Isaac, MD;  Location: [place];  Service: Thoracic;  Laterality: N/A;    There were no vitals filed for this visit.  Subjective Assessment - 03/25/18 1358    Subjective  Patient reported feeling a little sore after last session.     Pertinent History  HTN, DM, Bradycardia, h/o sarcodosis    Limitations  Lifting;House hold activities    Diagnostic tests  none    Patient Stated Goals  get rid of pain and move better with more strength    Currently in Pain?  Yes    Pain Score  1     Pain Orientation  Left    Pain Descriptors / Indicators  Sore    Pain Type  Chronic pain    Pain Onset  More than a month ago    Pain Frequency  Intermittent         [facility] PT Assessment - 03/25/18 0001      Assessment   Medical Diagnosis  Left shoulder pain, unspecified chronicity                   [facility] Adult PT Treatment/Exercise - 03/25/18 0001      Exercises   Exercises  Shoulder      Shoulder Exercises: Seated   Protraction  AAROM;Both;20 reps      Shoulder Exercises: Standing   Protraction  Left;Theraband;20 reps  Theraband Level (Shoulder Protraction)  Level 1 (Yellow)    External Rotation  AROM;Both;20 reps    Row  Strengthening;20 reps;Theraband    Theraband Level (Shoulder Row)  Level 1 (Yellow)    Other Standing Exercises  Cone lifts to bottom shelf x20      Shoulder Exercises: Pulleys   Flexion  5 minutes      Shoulder Exercises: ROM/Strengthening   UBE (Upper Arm Bike)  120 RPM x8' (4 fwd. 4 bwd)    Wall Wash  clockwise and counter clockwise x20    Thumb Tacks         Modalities   Modalities  Moist Heat;Electrical Stimulation      Moist Heat Therapy   Number Minutes Moist Heat  15 Minutes    Moist Heat Location  Shoulder      Electrical Stimulation   Electrical Stimulation Location  left shoulder     Electrical Stimulation Action  IFC    Electrical Stimulation Parameters  80-150 hz x15 min    Electrical Stimulation Goals   Pain                  PT Long Term Goals - 03/15/18 1931      PT LONG TERM GOAL #1   Title  Patient will be independent with HEP    Time  6    Period  Weeks    Status  New      PT LONG TERM GOAL #2   Title  Patient will report less than 3/10 pain in left shoulder with ADLs.     Time  6    Period  Weeks    Status  New      PT LONG TERM GOAL #3   Title  Patient will improve left shoulder flexion AROM to 115 or greater to improve functional activities.    Time  6    Period  Weeks    Status  New      PT LONG TERM GOAL #4   Title  Patient will improve L shoulder MMT to 4/5 or greater to improve ability to perform functional activities and care for wife.    Time  6    Period  Weeks    Status  New      PT LONG TERM GOAL #5   Title  Patient will improve L functional ER to top of head in order to wash and style hair.    Time  6    Period  Weeks    Status  New            Plan - 03/25/18 1418    Clinical Impression Statement  Patient was able to tolerate treatment although reported increased muscle fatigue and soreness. Patient noted with increased muscle fatigue with cone reaching to bottom shelf. Patient noted he still continues to see progress with functional activities but is still limited by pain and soreness. Normal response to modalities upon removal.    Clinical Presentation  Evolving    Clinical Decision Making  Moderate    Rehab Potential  Good    PT Frequency  2x / week    PT Duration  6 weeks    PT Treatment/Interventions  Iontophoresis 4mg /ml Dexamethasone;Moist Heat;Traction;Cryotherapy;Electrical Stimulation;Therapeutic activities;Neuromuscular re-education;Patient/family education;Taping;Manual techniques;Passive range of motion    PT Next Visit Plan  pulleys, UE ranger, LE strengthening, modalities PRN for pain relief.    Consulted and Agree with Plan of Care  Patient  Patient will benefit from skilled therapeutic intervention in order to  improve the following deficits and impairments:  Decreased activity tolerance, Decreased strength, Decreased range of motion, Impaired UE functional use, Postural dysfunction, Pain  Visit Diagnosis: Left shoulder pain, unspecified chronicity  Muscle weakness (generalized)     Problem List Patient Active Problem List   Diagnosis Date Noted  . Edema 02/23/2018  . Primary cancer of right upper lobe of lung ([place])   . Mediastinal lymphadenopathy 03/10/2017  . Anemia in stage 4 chronic kidney disease ([place]) 09/18/2016  . Vitamin D deficiency 09/18/2016  . Sinus bradycardia 08/31/2014  . First degree AV block 08/31/2014  . LBBB (left bundle branch block) 08/31/2014  . HTN (hypertension) 09/20/2013  . Diabetes ([place]) 05/02/2013  . Hyperlipemia 05/02/2013   Gabriela Eves, PT, DPT 03/25/2018, 4:46 PM  [facility] [address] [place], [place], [address] Phone: [phone]   Fax:  [phone]  Name: MILANO ROSEVEAR MRN: [id number] Date of Birth: 02-26-1930

## 2018-03-29 ENCOUNTER — Ambulatory Visit: Payer: PPO | Admitting: Physical Therapy

## 2018-03-29 DIAGNOSIS — M6281 Muscle weakness (generalized): Secondary | ICD-10-CM

## 2018-03-29 DIAGNOSIS — M25512 Pain in left shoulder: Secondary | ICD-10-CM | POA: Diagnosis not present

## 2018-03-29 NOTE — Therapy (Signed)
[facility] [place], [place], [address] Phone: [phone]   Fax:  [phone]  Physical Therapy Treatment  Patient Details  Name: Ruben Conner MRN: [id number] Date of Birth: 26-Dec-1929 Referring Provider: Redge Gainer, MD   Encounter Date: 03/29/2018  PT End of Session - 03/29/18 1447    Visit Number  5    Number of Visits  12    Date for PT Re-Evaluation  06/13/18    Authorization Type  Progress note every 10th visit; KX modifier after 15th visit    PT Start Time  1446 late arrival    PT Stop Time  1524    PT Time Calculation (min)  38 min    Activity Tolerance  Patient tolerated treatment well    Behavior During Therapy  [facility] for tasks assessed/performed       Past Medical History:  Diagnosis Date  . Cancer ([place])    lung  . Diabetes mellitus without complication ([place])   . Hyperlipidemia   . Hypertension   . Irregular heart rate    Dr. Victorino December note from 04/2017 states bradycardia, LBBB  . Pneumonia   . Renal insufficiency     Past Surgical History:  Procedure Laterality Date  . cyst removed    . ENDOBRONCHIAL ULTRASOUND Bilateral 03/23/2017   Procedure: ENDOBRONCHIAL ULTRASOUND;  Surgeon: Rigoberto Noel, MD;  [facility]: [facility];  Service: Cardiopulmonary;  Laterality: Bilateral;  . EYE SURGERY    . MEDIASTINOSCOPY N/A 04/08/2017   Procedure: MEDIASTINOSCOPY;  Surgeon: Grace Isaac, MD;  Location: [place];  Service: Thoracic;  Laterality: N/A;  . MOUTH SURGERY    . ROTATOR CUFF REPAIR    . tumor remoced from neck    . VIDEO BRONCHOSCOPY WITH ENDOBRONCHIAL ULTRASOUND N/A 04/08/2017   Procedure: VIDEO BRONCHOSCOPY WITH ENDOBRONCHIAL ULTRASOUND;  Surgeon: Grace Isaac, MD;  Location: [place];  Service: Thoracic;  Laterality: N/A;  . VIDEO BRONCHOSCOPY WITH ENDOBRONCHIAL ULTRASOUND N/A 12/08/2017   Procedure: VIDEO BRONCHOSCOPY with BRONCHIAL BIOPSIES AND ENDOBRONCHIAL ULTRASOUND WITH TRANSBRONCHIAL  AND BRONCHIAL BIOPSIES;  Surgeon: Grace Isaac, MD;  Location: [place];  Service: Thoracic;  Laterality: N/A;    There were no vitals filed for this visit.      [facility] PT Assessment - 03/29/18 0001      Assessment   Medical Diagnosis  Left shoulder pain, unspecified chronicity                   [facility] Adult PT Treatment/Exercise - 03/29/18 0001      Exercises   Exercises  Shoulder      Shoulder Exercises: Seated   Protraction  AAROM;Both;20 reps    External Rotation  AROM;Both;20 reps    Other Seated Exercises  UE ranger flexion, clockwise/counter clockwise x3 minutes each      Shoulder Exercises: Standing   Other Standing Exercises  Cone lifts to bottom shelf x10    Other Standing Exercises  UE ranger flexion x20, followed by wall ladder x5 to 27      Shoulder Exercises: Pulleys   Flexion  5 minutes      Modalities   Modalities  Moist Heat;Electrical Stimulation      Moist Heat Therapy   Number Minutes Moist Heat  10 Minutes    Moist Heat Location  Shoulder      Electrical Stimulation   Electrical Stimulation Location  Left Shoulder     Electrical Stimulation Action  IFC  Electrical Stimulation Parameters  80-150 hz x15 min    Electrical Stimulation Goals  Pain                  PT Long Term Goals - 03/15/18 1931      PT LONG TERM GOAL #1   Title  Patient will be independent with HEP    Time  6    Period  Weeks    Status  New      PT LONG TERM GOAL #2   Title  Patient will report less than 3/10 pain in left shoulder with ADLs.     Time  6    Period  Weeks    Status  New      PT LONG TERM GOAL #3   Title  Patient will improve left shoulder flexion AROM to 115 or greater to improve functional activities.    Time  6    Period  Weeks    Status  New      PT LONG TERM GOAL #4   Title  Patient will improve L shoulder MMT to 4/5 or greater to improve ability to perform functional activities and care for wife.    Time  6    Period   Weeks    Status  New      PT LONG TERM GOAL #5   Title  Patient will improve L functional ER to top of head in order to wash and style hair.    Time  6    Period  Weeks    Status  New            Plan - 03/29/18 1519    Clinical Impression Statement  Patient was able to tolerate treatment despite reports of muscle fatigue. Patient noted with shoulder elevation compensation during cone reaching secondary to fatigue. Patient overall reports an improvement in function since the start of pysical therapy. Normal response to modalities upon removal.    Clinical Presentation  Evolving    Clinical Decision Making  Moderate    Rehab Potential  Good    PT Frequency  2x / week    PT Duration  6 weeks    PT Treatment/Interventions  Iontophoresis 4mg /ml Dexamethasone;Moist Heat;Traction;Cryotherapy;Electrical Stimulation;Therapeutic activities;Neuromuscular re-education;Patient/family education;Taping;Manual techniques;Passive range of motion    PT Next Visit Plan  pulleys, UE ranger, LE strengthening, modalities PRN for pain relief.    Consulted and Agree with Plan of Care  Patient       Patient will benefit from skilled therapeutic intervention in order to improve the following deficits and impairments:  Decreased activity tolerance, Decreased strength, Decreased range of motion, Impaired UE functional use, Postural dysfunction, Pain  Visit Diagnosis: Left shoulder pain, unspecified chronicity  Muscle weakness (generalized)     Problem List Patient Active Problem List   Diagnosis Date Noted  . Edema 02/23/2018  . Primary cancer of right upper lobe of lung ([place])   . Mediastinal lymphadenopathy 03/10/2017  . Anemia in stage 4 chronic kidney disease ([place]) 09/18/2016  . Vitamin D deficiency 09/18/2016  . Sinus bradycardia 08/31/2014  . First degree AV block 08/31/2014  . LBBB (left bundle branch block) 08/31/2014  . HTN (hypertension) 09/20/2013  . Diabetes ([place]) 05/02/2013  .  Hyperlipemia 05/02/2013     Gabriela Eves, PT, DPT 03/29/2018, 4:32 PM  [facility] [address] [place], [place], [address] Phone: [phone]   Fax:  [phone]  Name: Ruben Conner MRN:  [id number] Date of Birth: Oct 23, 1930

## 2018-04-01 ENCOUNTER — Ambulatory Visit: Payer: PPO | Admitting: Physical Therapy

## 2018-04-01 DIAGNOSIS — M6281 Muscle weakness (generalized): Secondary | ICD-10-CM

## 2018-04-01 DIAGNOSIS — M25512 Pain in left shoulder: Secondary | ICD-10-CM

## 2018-04-01 NOTE — Therapy (Signed)
[facility] [place], [place], [address] Phone: [phone]   Fax:  [phone]  Physical Therapy Treatment  Patient Details  Name: Ruben Conner MRN: [id number] Date of Birth: Feb 10, 1930 Referring Provider: Redge Gainer, MD   Encounter Date: 04/01/2018  PT End of Session - 04/01/18 1306    Visit Number  6    Number of Visits  12    Date for PT Re-Evaluation  06/13/18    Authorization Type  Progress note every 10th visit; KX modifier after 15th visit    PT Start Time  1301    PT Stop Time  1359    PT Time Calculation (min)  58 min    Activity Tolerance  Patient tolerated treatment well    Behavior During Therapy  [facility] for tasks assessed/performed       Past Medical History:  Diagnosis Date  . Cancer ([place])    lung  . Diabetes mellitus without complication ([place])   . Hyperlipidemia   . Hypertension   . Irregular heart rate    Dr. Victorino December note from 04/2017 states bradycardia, LBBB  . Pneumonia   . Renal insufficiency     Past Surgical History:  Procedure Laterality Date  . cyst removed    . ENDOBRONCHIAL ULTRASOUND Bilateral 03/23/2017   Procedure: ENDOBRONCHIAL ULTRASOUND;  Surgeon: Rigoberto Noel, MD;  [facility];  Service: Cardiopulmonary;  Laterality: Bilateral;  . EYE SURGERY    . MEDIASTINOSCOPY N/A 04/08/2017   Procedure: MEDIASTINOSCOPY;  Surgeon: Grace Isaac, MD;  Location: [place];  Service: Thoracic;  Laterality: N/A;  . MOUTH SURGERY    . ROTATOR CUFF REPAIR    . tumor remoced from neck    . VIDEO BRONCHOSCOPY WITH ENDOBRONCHIAL ULTRASOUND N/A 04/08/2017   Procedure: VIDEO BRONCHOSCOPY WITH ENDOBRONCHIAL ULTRASOUND;  Surgeon: Grace Isaac, MD;  Location: [place];  Service: Thoracic;  Laterality: N/A;  . VIDEO BRONCHOSCOPY WITH ENDOBRONCHIAL ULTRASOUND N/A 12/08/2017   Procedure: VIDEO BRONCHOSCOPY with BRONCHIAL BIOPSIES AND ENDOBRONCHIAL ULTRASOUND WITH TRANSBRONCHIAL AND BRONCHIAL  BIOPSIES;  Surgeon: Grace Isaac, MD;  Location: [place];  Service: Thoracic;  Laterality: N/A;    There were no vitals filed for this visit.      [facility] PT Assessment - 04/01/18 0001      Assessment   Medical Diagnosis  Left shoulder pain, unspecified chronicity                   [facility] Adult PT Treatment/Exercise - 04/01/18 0001      Shoulder Exercises: Seated   Protraction  AAROM;Both;20 reps    External Rotation  AROM;Left;20 reps    Other Seated Exercises  --      Shoulder Exercises: Standing   Protraction  Left;20 reps;10 reps;Theraband    Theraband Level (Shoulder Protraction)  Level 1 (Yellow)    External Rotation  Strengthening;Left;10 reps;Theraband    Theraband Level (Shoulder External Rotation)  Level 1 (Yellow)    Row  Strengthening;20 reps;Theraband    Theraband Level (Shoulder Row)  Level 1 (Yellow)    Other Standing Exercises  Cone lifts to bottom shelf 2x10    Other Standing Exercises  UE ranger flexion, cw,ccw x20      Shoulder Exercises: Pulleys   Flexion  5 minutes      Shoulder Exercises: ROM/Strengthening   UBE (Upper Arm Bike)  120 RPM x8' (4 fwd. 4 bwd)      Modalities   Modalities  Moist Heat;Electrical Stimulation      Moist Heat Therapy   Number Minutes Moist Heat  15 Minutes    Moist Heat Location  Shoulder      Electrical Stimulation   Electrical Stimulation Location  Left Shoulder     Electrical Stimulation Action  IFC     Electrical Stimulation Parameters  80-150 hz x15    Electrical Stimulation Goals  Pain                  PT Long Term Goals - 03/15/18 1931      PT LONG TERM GOAL #1   Title  Patient will be independent with HEP    Time  6    Period  Weeks    Status  New      PT LONG TERM GOAL #2   Title  Patient will report less than 3/10 pain in left shoulder with ADLs.     Time  6    Period  Weeks    Status  New      PT LONG TERM GOAL #3   Title  Patient will improve left shoulder flexion  AROM to 115 or greater to improve functional activities.    Time  6    Period  Weeks    Status  New      PT LONG TERM GOAL #4   Title  Patient will improve L shoulder MMT to 4/5 or greater to improve ability to perform functional activities and care for wife.    Time  6    Period  Weeks    Status  New      PT LONG TERM GOAL #5   Title  Patient will improve L functional ER to top of head in order to wash and style hair.    Time  6    Period  Weeks    Status  New            Plan - 04/01/18 1606    Clinical Impression Statement  Patient was able to tolerate treatment with minimal reports of pain. Patient attempted yellow tband horizontal abduction however exercise was terminated secondary to pain. Normal response to modalites upon removal.    Clinical Presentation  Evolving    Clinical Decision Making  Moderate    Rehab Potential  Good    PT Frequency  2x / week    PT Duration  6 weeks    PT Treatment/Interventions  Iontophoresis 4mg /ml Dexamethasone;Moist Heat;Traction;Cryotherapy;Electrical Stimulation;Therapeutic activities;Neuromuscular re-education;Patient/family education;Taping;Manual techniques;Passive range of motion    PT Next Visit Plan  Assess goals next visit pulleys, UE ranger, LE strengthening, modalities PRN for pain relief.    Consulted and Agree with Plan of Care  Patient       Patient will benefit from skilled therapeutic intervention in order to improve the following deficits and impairments:  Decreased activity tolerance, Decreased strength, Decreased range of motion, Impaired UE functional use, Postural dysfunction, Pain  Visit Diagnosis: Left shoulder pain, unspecified chronicity  Muscle weakness (generalized)     Problem List Patient Active Problem List   Diagnosis Date Noted  . Edema 02/23/2018  . Primary cancer of right upper lobe of lung ([place])   . Mediastinal lymphadenopathy 03/10/2017  . Anemia in stage 4 chronic kidney disease ([place])  09/18/2016  . Vitamin D deficiency 09/18/2016  . Sinus bradycardia 08/31/2014  . First degree AV block 08/31/2014  . LBBB (left bundle branch block) 08/31/2014  . HTN (hypertension)  09/20/2013  . Diabetes ([place]) 05/02/2013  . Hyperlipemia 05/02/2013   Gabriela Eves, PT, DPT 04/01/2018, 6:15 PM  [facility] [address] [place], [place], [address] Phone: [phone]   Fax:  [phone]  Name: Ruben Conner MRN: [id number] Date of Birth: 06-Nov-1930

## 2018-04-05 DIAGNOSIS — R809 Proteinuria, unspecified: Secondary | ICD-10-CM | POA: Diagnosis not present

## 2018-04-05 DIAGNOSIS — D869 Sarcoidosis, unspecified: Secondary | ICD-10-CM | POA: Diagnosis not present

## 2018-04-05 DIAGNOSIS — N183 Chronic kidney disease, stage 3 (moderate): Secondary | ICD-10-CM | POA: Diagnosis not present

## 2018-04-05 DIAGNOSIS — I10 Essential (primary) hypertension: Secondary | ICD-10-CM | POA: Diagnosis not present

## 2018-04-05 DIAGNOSIS — N2581 Secondary hyperparathyroidism of renal origin: Secondary | ICD-10-CM | POA: Diagnosis not present

## 2018-04-06 ENCOUNTER — Ambulatory Visit (INDEPENDENT_AMBULATORY_CARE_PROVIDER_SITE_OTHER): Payer: PPO | Admitting: Pulmonary Disease

## 2018-04-06 ENCOUNTER — Ambulatory Visit: Payer: PPO | Admitting: Physical Therapy

## 2018-04-06 ENCOUNTER — Encounter: Payer: Self-pay | Admitting: Pulmonary Disease

## 2018-04-06 DIAGNOSIS — M25512 Pain in left shoulder: Secondary | ICD-10-CM | POA: Diagnosis not present

## 2018-04-06 DIAGNOSIS — D869 Sarcoidosis, unspecified: Secondary | ICD-10-CM | POA: Diagnosis not present

## 2018-04-06 DIAGNOSIS — N184 Chronic kidney disease, stage 4 (severe): Secondary | ICD-10-CM | POA: Insufficient documentation

## 2018-04-06 DIAGNOSIS — M6281 Muscle weakness (generalized): Secondary | ICD-10-CM

## 2018-04-06 NOTE — Therapy (Signed)
[facility] [place], [place], [address] Phone: [phone]   Fax:  [phone]  Physical Therapy Treatment  Patient Details  Name: Ruben Conner MRN: [id number] Date of Birth: [birth year] Referring Provider: Redge Gainer, MD   Encounter Date: 04/06/2018  PT End of Session - 04/06/18 1036    Visit Number  7    Number of Visits  12    Date for PT Re-Evaluation  06/13/18    Authorization Type  Progress note every 10th visit; KX modifier after 15th visit    PT Start Time  1030    PT Stop Time  1123    PT Time Calculation (min)  53 min    Activity Tolerance  Patient tolerated treatment well    Behavior During Therapy  [facility] for tasks assessed/performed       Past Medical History:  Diagnosis Date  . Cancer ([place])    lung  . Diabetes mellitus without complication ([place])   . Hyperlipidemia   . Hypertension   . Irregular heart rate    Dr. Victorino December note from 04/2017 states bradycardia, LBBB  . Pneumonia   . Renal insufficiency     Past Surgical History:  Procedure Laterality Date  . cyst removed    . ENDOBRONCHIAL ULTRASOUND Bilateral 03/23/2017   Procedure: ENDOBRONCHIAL ULTRASOUND;  Surgeon: Rigoberto Noel, MD;  [facility];  Service: Cardiopulmonary;  Laterality: Bilateral;  . EYE SURGERY    . MEDIASTINOSCOPY N/A 04/08/2017   Procedure: MEDIASTINOSCOPY;  Surgeon: Grace Isaac, MD;  Location: [place];  Service: Thoracic;  Laterality: N/A;  . MOUTH SURGERY    . ROTATOR CUFF REPAIR    . tumor remoced from neck    . VIDEO BRONCHOSCOPY WITH ENDOBRONCHIAL ULTRASOUND N/A 04/08/2017   Procedure: VIDEO BRONCHOSCOPY WITH ENDOBRONCHIAL ULTRASOUND;  Surgeon: Grace Isaac, MD;  Location: [place];  Service: Thoracic;  Laterality: N/A;  . VIDEO BRONCHOSCOPY WITH ENDOBRONCHIAL ULTRASOUND N/A 12/08/2017   Procedure: VIDEO BRONCHOSCOPY with BRONCHIAL BIOPSIES AND ENDOBRONCHIAL ULTRASOUND WITH TRANSBRONCHIAL AND BRONCHIAL  BIOPSIES;  Surgeon: Grace Isaac, MD;  Location: [place];  Service: Thoracic;  Laterality: N/A;    There were no vitals filed for this visit.  Subjective Assessment - 04/06/18 1037    Subjective  "I'm pleased at how well my shoulder is coming along."    Pertinent History  HTN, DM, Bradycardia, h/o sarcodosis    Limitations  Lifting;House hold activities    Diagnostic tests  none    Patient Stated Goals  get rid of pain and move better with more strength    Currently in Pain?  Yes    Pain Score  1     Pain Orientation  Left    Pain Descriptors / Indicators  Sore    Pain Type  Chronic pain    Pain Onset  More than a month ago         [facility] PT Assessment - 04/06/18 0001      Assessment   Medical Diagnosis  Left shoulder pain, unspecified chronicity                   [facility] Adult PT Treatment/Exercise - 04/06/18 0001      Exercises   Exercises  Shoulder      Shoulder Exercises: Standing   Protraction  Left;20 reps;10 reps;Theraband    Theraband Level (Shoulder Protraction)  Level 1 (Yellow)    External Rotation  AROM;Left;20 reps;10 reps  x30    Internal Rotation  Strengthening;Left;20 reps;Theraband    Theraband Level (Shoulder Internal Rotation)  Level 1 (Yellow)      Shoulder Exercises: Pulleys   Flexion  5 minutes      Shoulder Exercises: Therapy Ball   Other Therapy Ball Exercises  table ABCs A-Z x1      Shoulder Exercises: ROM/Strengthening   UBE (Upper Arm Bike)  120 RPM x8' (4 fwd. 4 bwd)    Wall Wash  flexion x20      Modalities   Modalities  Moist Heat;Electrical Stimulation      Moist Heat Therapy   Number Minutes Moist Heat  15 Minutes    Moist Heat Location  Shoulder      Electrical Stimulation   Electrical Stimulation Location  Left Shoulder     Electrical Stimulation Action  IFC    Electrical Stimulation Parameters  80-150 hz x15 min    Electrical Stimulation Goals  Pain                  PT Long Term Goals - 03/15/18  1931      PT LONG TERM GOAL #1   Title  Patient will be independent with HEP    Time  6    Period  Weeks    Status  New      PT LONG TERM GOAL #2   Title  Patient will report less than 3/10 pain in left shoulder with ADLs.     Time  6    Period  Weeks    Status  New      PT LONG TERM GOAL #3   Title  Patient will improve left shoulder flexion AROM to 115 or greater to improve functional activities.    Time  6    Period  Weeks    Status  New      PT LONG TERM GOAL #4   Title  Patient will improve L shoulder MMT to 4/5 or greater to improve ability to perform functional activities and care for wife.    Time  6    Period  Weeks    Status  New      PT LONG TERM GOAL #5   Title  Patient will improve L functional ER to top of head in order to wash and style hair.    Time  6    Period  Weeks    Status  New            Plan - 04/06/18 1233    Clinical Impression Statement  Patient was able to complete exercises with minimal reports of muscle fatigue. Patient demonstrated good form with AROM ER with towel tactile cue. No adverse affects noted upon removal of modalities. Patient noted E-stim has helped decrease pain significantly with each PT session.    Clinical Presentation  Evolving    Clinical Decision Making  Moderate    Rehab Potential  Good    PT Frequency  2x / week    PT Duration  6 weeks    PT Treatment/Interventions  Iontophoresis 4mg /ml Dexamethasone;Moist Heat;Traction;Cryotherapy;Electrical Stimulation;Therapeutic activities;Neuromuscular re-education;Patient/family education;Taping;Manual techniques;Passive range of motion    PT Next Visit Plan  Assess goals next visit pulleys, UE ranger, LE strengthening, modalities PRN for pain relief.    Consulted and Agree with Plan of Care  Patient       Patient will benefit from skilled therapeutic intervention in order to improve the following deficits and  impairments:  Decreased activity tolerance, Decreased strength,  Decreased range of motion, Impaired UE functional use, Postural dysfunction, Pain  Visit Diagnosis: Left shoulder pain, unspecified chronicity  Muscle weakness (generalized)     Problem List Patient Active Problem List   Diagnosis Date Noted  . Edema 02/23/2018  . Primary cancer of right upper lobe of lung ([place])   . Mediastinal lymphadenopathy 03/10/2017  . Anemia in stage 4 chronic kidney disease ([place]) 09/18/2016  . Vitamin D deficiency 09/18/2016  . Sinus bradycardia 08/31/2014  . First degree AV block 08/31/2014  . LBBB (left bundle branch block) 08/31/2014  . HTN (hypertension) 09/20/2013  . Diabetes ([place]) 05/02/2013  . Hyperlipemia 05/02/2013    Gabriela Eves, PT, DPT 04/06/2018, 12:36 PM  [facility] [address] [place], [place], [address] Phone: [phone]   Fax:  [phone]  Name: KENAN MOODIE MRN: [id number] Date of Birth: 11/25/1929

## 2018-04-06 NOTE — Assessment & Plan Note (Signed)
Started on torsemide which hopefully will decrease his edema and improve his shortness of breath too

## 2018-04-06 NOTE — Progress Notes (Signed)
   Subjective:    Patient ID: Ruben Conner, male    DOB: 1930/07/22, 84 y.o.   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 Follow-up imaging showed increased bilateral hilar lymphadenopathy and right upper lobe changes of radiation pneumonitis. He finally underwent CT biopsy of RUL lung mass 01/07/18 >>non-necrotizing granulomatous inflammation  He has past medical history of hypertension, diabetes and hyperlipidemia and CKD4 Joelyn Oms ) He was initially on 20 mg of prednisone and on last visit 02/2018 he was again restarted on 10 mg of prednisone since he had self discontinued. He now has increasing bipedal edema.  He also reports increased cough and wheezing.  Torsemide has just been started by renal.  He remains on benazepril on review of his medications and Actos Is compliant with 10 mg of prednisone   Significant tests/ events reviewed  CT chest without contrast, since his creatinine was high at 2.4 on 02/26/17. This showed a right hilar soft tissue mass 6.64.8 cm which narrowed the superior vena cava and the right upper lobe bronchus. Scattered mediastinal lymph nodes were noted largest precarinal lymph node measuring 13 mm. A right lower lobe subpleural nodule about 5 mm was noted there was also a calcified granuloma in the right upper lobe.  ? PET scan 2020/09/2817-hypermetabolic medial right upper lobe mass, hypermetabolic mediastinal lymph nodes, hypermetabolic left parotid nodule ? Bronchoscopy 03/23/2017-normal airways, EBUS biopsy of a right paratracheal node in the right upper lung with bronchial brushings/washings returned nondiagnostic ? Bronchoscopy/EBUSand mediastinoscopy 04/08/2017-multiple cytologies and  lymph node biopsies revealed no malignancy  Bronchoscopy/EBUS1/20/2019, no endobronchial tumor seen, brushings/biopsies from the right upper lobe and biopsies from multiple lymph nodes revealed "atypical cells ", nondiagnostic of malignancy   PFTs 02/2018 >> no airflow obstruction or restriction. Except for Diffusing defect  FEV1 99%, ratio 75, FVC 92%, DLCO 59%.  11% bronchodilator change.  Mid flow reversibility.  Past Medical History:  Diagnosis Date  . Cancer ([place])    lung  . Diabetes mellitus without complication ([place])   . Hyperlipidemia   . Hypertension   . Irregular heart rate    Dr. Victorino December note from 04/2017 states bradycardia, LBBB  . Pneumonia   . Renal insufficiency      Review of Systems neg for any significant sore throat, dysphagia, itching, sneezing, nasal congestion or excess/ purulent secretions, fever, chills, sweats, unintended wt loss, pleuritic or exertional cp, hempoptysis, orthopnea pnd    Also denies presyncope, palpitations, heartburn, abdominal pain, nausea, vomiting, diarrhea or change in bowel or urinary habits, dysuria,hematuria, rash, arthralgias, visual complaints, headache, numbness weakness or ataxia.     Objective:   Physical Exam  Gen. Pleasant, well-nourished, in no distress ENT - no thrush, no post nasal drip Neck: No JVD, no thyromegaly, no carotid bruits Lungs: no use of accessory muscles, no dullness to percussion, clear without rales or rhonchi  Cardiovascular: Rhythm regular, heart sounds  normal, no murmurs or gallops, 2+ peripheral edema Musculoskeletal: No deformities, no cyanosis or clubbing        Assessment & Plan:

## 2018-04-06 NOTE — Assessment & Plan Note (Signed)
Change from benazepril to ARB, will defer to renal for this since he has CKD advanced already

## 2018-04-06 NOTE — Assessment & Plan Note (Signed)
CT chest without contrast -Based on this we will decide about continuing the prednisone, meanwhile continue 10 mg

## 2018-04-06 NOTE — Patient Instructions (Signed)
CT chest without contrast -Based on this we will decide about continuing the prednisone.  Discussed with Dr. Joelyn Oms about changing from benazepril to another medication because of its effect on the lungs

## 2018-04-08 ENCOUNTER — Ambulatory Visit: Payer: PPO | Admitting: *Deleted

## 2018-04-08 DIAGNOSIS — M25512 Pain in left shoulder: Secondary | ICD-10-CM | POA: Diagnosis not present

## 2018-04-08 DIAGNOSIS — M6281 Muscle weakness (generalized): Secondary | ICD-10-CM

## 2018-04-08 NOTE — Therapy (Signed)
[facility] [place], [place], [address] Phone: [phone]   Fax:  [phone]  Physical Therapy Treatment  Patient Details  Name: Ruben Conner MRN: [id number] Date of Birth: February 28, 1930 Referring Provider: Redge Gainer, MD   Encounter Date: 04/08/2018  PT End of Session - 04/08/18 1355    Visit Number  8    Number of Visits  12    Date for PT Re-Evaluation  06/13/18    Authorization Type  Progress note every 10th visit; KX modifier after 15th visit    PT Start Time  1346    PT Stop Time  1435    PT Time Calculation (min)  49 min       Past Medical History:  Diagnosis Date  . Cancer ([place])    lung  . Diabetes mellitus without complication ([place])   . Hyperlipidemia   . Hypertension   . Irregular heart rate    Dr. Victorino December note from 04/2017 states bradycardia, LBBB  . Pneumonia   . Renal insufficiency     Past Surgical History:  Procedure Laterality Date  . cyst removed    . ENDOBRONCHIAL ULTRASOUND Bilateral 03/23/2017   Procedure: ENDOBRONCHIAL ULTRASOUND;  Surgeon: Rigoberto Noel, MD;  Location: [facility];  Service: Cardiopulmonary;  Laterality: Bilateral;  . EYE SURGERY    . MEDIASTINOSCOPY N/A 04/08/2017   Procedure: MEDIASTINOSCOPY;  Surgeon: Grace Isaac, MD;  Location: [facility];  Service: Thoracic;  Laterality: N/A;  . MOUTH SURGERY    . ROTATOR CUFF REPAIR    . tumor remoced from neck    . VIDEO BRONCHOSCOPY WITH ENDOBRONCHIAL ULTRASOUND N/A 04/08/2017   Procedure: VIDEO BRONCHOSCOPY WITH ENDOBRONCHIAL ULTRASOUND;  Surgeon: Grace Isaac, MD;  Location: [facility];  Service: Thoracic;  Laterality: N/A;  . VIDEO BRONCHOSCOPY WITH ENDOBRONCHIAL ULTRASOUND N/A 12/08/2017   Procedure: VIDEO BRONCHOSCOPY with BRONCHIAL BIOPSIES AND ENDOBRONCHIAL ULTRASOUND WITH TRANSBRONCHIAL AND BRONCHIAL BIOPSIES;  Surgeon: Grace Isaac, MD;  Location: [facility];  Service: Thoracic;  Laterality: N/A;    There were  no vitals filed for this visit.  Subjective Assessment - 04/08/18 1349    Subjective  "I'm pleased at how well my shoulder is coming along."    Pertinent History  HTN, DM, Bradycardia, h/o sarcodosis    Limitations  Lifting;House hold activities    Diagnostic tests  none    Patient Stated Goals  get rid of pain and move better with more strength    Currently in Pain?  Yes    Pain Score  2     Pain Orientation  Left    Pain Descriptors / Indicators  Sore    Pain Type  Chronic pain    Pain Onset  More than a month ago    Pain Frequency  Intermittent                       [facility] Adult PT Treatment/Exercise - 04/08/18 0001      Exercises   Exercises  Shoulder      Shoulder Exercises: Standing   Protraction  Left;20 reps;10 reps;Theraband    Theraband Level (Shoulder Protraction)  Level 1 (Yellow)    External Rotation  AROM;Left;20 reps;10 reps x30    Internal Rotation  Strengthening;Left;20 reps;Theraband    Theraband Level (Shoulder Internal Rotation)  Level 1 (Yellow)    Row  Strengthening;20 reps;Theraband    Theraband Level (Shoulder Row)  Level 1 (Yellow)  Shoulder Exercises: Pulleys   Flexion  5 minutes      Shoulder Exercises: Therapy Ball   Other Therapy Ball Exercises  table ABCs A-Z x1      Shoulder Exercises: ROM/Strengthening   UBE (Upper Arm Bike)  120 RPM x8' (4 fwd. 4 bwd)    Wall Wash  flexion x20      Modalities   Modalities  Moist Heat;Electrical Stimulation      Moist Heat Therapy   Number Minutes Moist Heat  15 Minutes    Moist Heat Location  Shoulder      Electrical Stimulation   Electrical Stimulation Location  Left Shoulder IFC x 15 mins 80-150hz     Electrical Stimulation Goals  Pain                  PT Long Term Goals - 04/08/18 1410      PT LONG TERM GOAL #1   Title  Patient will be independent with HEP    Time  6    Period  Weeks    Status  On-going      PT LONG TERM GOAL #2   Title  Patient will report  less than 3/10 pain in left shoulder with ADLs.     Time  6    Period  Weeks    Status  On-going      PT LONG TERM GOAL #3   Title  Patient will improve left shoulder flexion AROM to 115 or greater to improve functional activities.    Time  6    Period  Weeks    Status  On-going      PT LONG TERM GOAL #4   Title  Patient will improve L shoulder MMT to 4/5 or greater to improve ability to perform functional activities and care for wife.    Period  Weeks    Status  On-going      PT LONG TERM GOAL #5   Title  Patient will improve L functional ER to top of head in order to wash and style hair.    Time  6    Period  Weeks    Status  Achieved            Plan - 04/08/18 1409    Clinical Impression Statement  Pt arrived today doing fairly well with decreased pain in LT shldr 70% better. He was able to perform RW4 exs as well as UBE without complaints. He was able to meet LTG for ER and using LT UE to help wash hair, but unable to meet LTG for elevation of LT UE. Greatest challenge is antigravity elevation only 60 degrees today.    Clinical Presentation  Evolving    Rehab Potential  Good    PT Frequency  2x / week    PT Duration  6 weeks    PT Next Visit Plan  Assess goals next visit pulleys, UE ranger, LE strengthening, modalities PRN for pain relief.    PT Home Exercise Plan  scapular retractions, AAROM scaption with cane, corner stretch    Consulted and Agree with Plan of Care  Patient       Patient will benefit from skilled therapeutic intervention in order to improve the following deficits and impairments:  Decreased activity tolerance, Decreased strength, Decreased range of motion, Impaired UE functional use, Postural dysfunction, Pain  Visit Diagnosis: Left shoulder pain, unspecified chronicity  Muscle weakness (generalized)     Problem List Patient Active  Problem List   Diagnosis Date Noted  . Sarcoidosis 04/06/2018  . CKD (chronic kidney disease) stage 4, GFR  15-29 ml/min (HCC) 04/06/2018  . Edema 02/23/2018  . Mediastinal lymphadenopathy 03/10/2017  . Anemia in stage 4 chronic kidney disease ([place]) 09/18/2016  . Vitamin D deficiency 09/18/2016  . Sinus bradycardia 08/31/2014  . First degree AV block 08/31/2014  . LBBB (left bundle branch block) 08/31/2014  . HTN (hypertension) 09/20/2013  . Diabetes ([place]) 05/02/2013  . Hyperlipemia 05/02/2013    Jacory Kamel,CHRIS, PTA 04/08/2018, 3:07 PM  [facility] [address] [place], [place], [address] Phone: [phone]   Fax:  [phone]  Name: Ruben Conner MRN: [id number] Date of Birth: 10/23/1930

## 2018-04-13 ENCOUNTER — Encounter: Payer: Self-pay | Admitting: Physical Therapy

## 2018-04-13 ENCOUNTER — Ambulatory Visit: Payer: PPO | Admitting: Physical Therapy

## 2018-04-13 DIAGNOSIS — M25512 Pain in left shoulder: Secondary | ICD-10-CM

## 2018-04-13 DIAGNOSIS — M6281 Muscle weakness (generalized): Secondary | ICD-10-CM

## 2018-04-13 NOTE — Therapy (Signed)
[facility] [place], [place], [address] Phone: [phone]   Fax:  [phone]  Physical Therapy Treatment  Patient Details  Name: Ruben Conner MRN: [id number] Date of Birth: 03/12/1930 Referring Provider: Redge Gainer, MD   Encounter Date: 04/13/2018  PT End of Session - 04/13/18 1545    Visit Number  9    Number of Visits  12    Date for PT Re-Evaluation  06/13/18    Authorization Type  Progress note every 10th visit; KX modifier after 15th visit    PT Start Time  1518    PT Stop Time  1605    PT Time Calculation (min)  47 min    Activity Tolerance  Patient tolerated treatment well    Behavior During Therapy  [facility] for tasks assessed/performed       Past Medical History:  Diagnosis Date  . Cancer ([place])    lung  . Diabetes mellitus without complication ([place])   . Hyperlipidemia   . Hypertension   . Irregular heart rate    Dr. Victorino December note from 04/2017 states bradycardia, LBBB  . Pneumonia   . Renal insufficiency     Past Surgical History:  Procedure Laterality Date  . cyst removed    . ENDOBRONCHIAL ULTRASOUND Bilateral 03/23/2017   Procedure: ENDOBRONCHIAL ULTRASOUND;  Surgeon: Rigoberto Noel, MD;  [facility];  Service: Cardiopulmonary;  Laterality: Bilateral;  . EYE SURGERY    . MEDIASTINOSCOPY N/A 04/08/2017   Procedure: MEDIASTINOSCOPY;  Surgeon: Grace Isaac, MD;  Location: [facility];  Service: Thoracic;  Laterality: N/A;  . MOUTH SURGERY    . ROTATOR CUFF REPAIR    . tumor remoced from neck    . VIDEO BRONCHOSCOPY WITH ENDOBRONCHIAL ULTRASOUND N/A 04/08/2017   Procedure: VIDEO BRONCHOSCOPY WITH ENDOBRONCHIAL ULTRASOUND;  Surgeon: Grace Isaac, MD;  Location: [facility];  Service: Thoracic;  Laterality: N/A;  . VIDEO BRONCHOSCOPY WITH ENDOBRONCHIAL ULTRASOUND N/A 12/08/2017   Procedure: VIDEO BRONCHOSCOPY with BRONCHIAL BIOPSIES AND ENDOBRONCHIAL ULTRASOUND WITH TRANSBRONCHIAL AND BRONCHIAL  BIOPSIES;  Surgeon: Grace Isaac, MD;  Location: [facility];  Service: Thoracic;  Laterality: N/A;    There were no vitals filed for this visit.  Subjective Assessment - 04/13/18 1518    Subjective  Reports soreness with his shoulder but tryingt to do more with his shoulder. Reports that he thought he would have more strength by now.    Pertinent History  HTN, DM, Bradycardia, h/o sarcodosis    Limitations  Lifting;House hold activities    Diagnostic tests  none    Patient Stated Goals  get rid of pain and move better with more strength    Currently in Pain?  Yes    Pain Score  2     Pain Location  Shoulder    Pain Orientation  Left    Pain Descriptors / Indicators  Sore;Nagging    Pain Type  Chronic pain    Pain Onset  More than a month ago    Pain Frequency  Intermittent         [facility] PT Assessment - 04/13/18 0001      Assessment   Medical Diagnosis  Left shoulder pain, unspecified chronicity    Hand Dominance  Right    Prior Therapy  yes, previous Left RTC                    [facility] Adult PT Treatment/Exercise - 04/13/18 0001  Shoulder Exercises: Supine   Flexion  AAROM;Both;20 reps AAROM from PTA for LUE    Other Supine Exercises  L elbow flexion 2# x20 reps      Shoulder Exercises: Standing   External Rotation  Strengthening;Left;20 reps;Theraband Max tactile cues with VCs for proper technique    Theraband Level (Shoulder External Rotation)  Level 1 (Yellow)    Internal Rotation  Strengthening;Left;20 reps;Theraband    Theraband Level (Shoulder Internal Rotation)  Level 1 (Yellow)    Extension  Strengthening;Left;20 reps;Theraband    Theraband Level (Shoulder Extension)  Level 1 (Yellow)    Row  Strengthening;20 reps;Theraband    Theraband Level (Shoulder Row)  Level 1 (Yellow)    Other Standing Exercises  Wall slides x17 reps d/t fatigue/discomfort    Other Standing Exercises  L shoulder CW and CCW circles x20 reps each      Shoulder Exercises:  Pulleys   Flexion  5 minutes      Shoulder Exercises: ROM/Strengthening   UBE (Upper Arm Bike)  120 RPM x6' (3 fwd. 3 bwd)      Modalities   Modalities  Moist Heat;Electrical Stimulation      Moist Heat Therapy   Number Minutes Moist Heat  15 Minutes    Moist Heat Location  Shoulder      Electrical Stimulation   Electrical Stimulation Location  L shoulder    Electrical Stimulation Action  IFC    Electrical Stimulation Parameters  1-10 hz x15 min    Electrical Stimulation Goals  Pain                  PT Long Term Goals - 04/08/18 1410      PT LONG TERM GOAL #1   Title  Patient will be independent with HEP    Time  6    Period  Weeks    Status  On-going      PT LONG TERM GOAL #2   Title  Patient will report less than 3/10 pain in left shoulder with ADLs.     Time  6    Period  Weeks    Status  On-going      PT LONG TERM GOAL #3   Title  Patient will improve left shoulder flexion AROM to 115 or greater to improve functional activities.    Time  6    Period  Weeks    Status  On-going      PT LONG TERM GOAL #4   Title  Patient will improve L shoulder MMT to 4/5 or greater to improve ability to perform functional activities and care for wife.    Period  Weeks    Status  On-going      PT LONG TERM GOAL #5   Title  Patient will improve L functional ER to top of head in order to wash and style hair.    Time  6    Period  Weeks    Status  Achieved            Plan - 04/13/18 1622    Clinical Impression Statement  Patient tolerated today's treatment fairly well as he arrived with increased R shoulder soreness. Patient required max assist with ER due to weakness and lack of proper technique. Patient limited with muscle fatigue in LUE with wall slides and wall wash. AAROM of LUE into flexion in supine completed along with assist from PTA due to weakness. Normal modalities response noted following removal of  the modalities.    Rehab Potential  Good    PT  Frequency  2x / week    PT Duration  6 weeks    PT Treatment/Interventions  Iontophoresis 4mg /ml Dexamethasone;Moist Heat;Traction;Cryotherapy;Electrical Stimulation;Therapeutic activities;Neuromuscular re-education;Patient/family education;Taping;Manual techniques;Passive range of motion    PT Next Visit Plan  Assess goals next visit pulleys, UE ranger, LE strengthening, modalities PRN for pain relief.    PT Home Exercise Plan  scapular retractions, AAROM scaption with cane, corner stretch    Consulted and Agree with Plan of Care  Patient       Patient will benefit from skilled therapeutic intervention in order to improve the following deficits and impairments:  Decreased activity tolerance, Decreased strength, Decreased range of motion, Impaired UE functional use, Postural dysfunction, Pain  Visit Diagnosis: Left shoulder pain, unspecified chronicity  Muscle weakness (generalized)     Problem List Patient Active Problem List   Diagnosis Date Noted  . Sarcoidosis 04/06/2018  . CKD (chronic kidney disease) stage 4, GFR 15-29 ml/min (HCC) 04/06/2018  . Edema 02/23/2018  . Mediastinal lymphadenopathy 03/10/2017  . Anemia in stage 4 chronic kidney disease ([place]) 09/18/2016  . Vitamin D deficiency 09/18/2016  . Sinus bradycardia 08/31/2014  . First degree AV block 08/31/2014  . LBBB (left bundle branch block) 08/31/2014  . HTN (hypertension) 09/20/2013  . Diabetes ([place]) 05/02/2013  . Hyperlipemia 05/02/2013    Standley Brooking, PTA 04/13/2018, 4:39 PM  [facility] [address] [place], [place], [address] Phone: [phone]   Fax:  [phone]  Name: Ruben Conner MRN: [id number] Date of Birth: 12-28-1929

## 2018-04-14 ENCOUNTER — Other Ambulatory Visit: Payer: Self-pay | Admitting: Family Medicine

## 2018-04-14 NOTE — Telephone Encounter (Signed)
Next Ov 07/13/18

## 2018-04-15 ENCOUNTER — Ambulatory Visit (HOSPITAL_COMMUNITY)
Admission: RE | Admit: 2018-04-15 | Discharge: 2018-04-15 | Disposition: A | Discharge status: Home / Self Care | Payer: PPO | Source: Ambulatory Visit | Attending: Pulmonary Disease | Admitting: Pulmonary Disease

## 2018-04-15 ENCOUNTER — Encounter: Payer: Self-pay | Admitting: Physical Therapy

## 2018-04-15 ENCOUNTER — Ambulatory Visit: Payer: PPO | Admitting: Physical Therapy

## 2018-04-15 DIAGNOSIS — R918 Other nonspecific abnormal finding of lung field: Secondary | ICD-10-CM | POA: Diagnosis not present

## 2018-04-15 DIAGNOSIS — M25512 Pain in left shoulder: Secondary | ICD-10-CM

## 2018-04-15 DIAGNOSIS — I700 Atherosclerosis of aorta: Secondary | ICD-10-CM | POA: Insufficient documentation

## 2018-04-15 DIAGNOSIS — D869 Sarcoidosis, unspecified: Secondary | ICD-10-CM

## 2018-04-15 DIAGNOSIS — M6281 Muscle weakness (generalized): Secondary | ICD-10-CM

## 2018-04-15 NOTE — Therapy (Signed)
[facility] [place], [place], [address] Phone: [phone]   Fax:  [phone]  Physical Therapy Treatment  Patient Details  Name: Ruben Conner MRN: [id number] Date of Birth: 1930-04-03 Referring Provider: Redge Gainer, MD   Encounter Date: 04/15/2018  PT End of Session - 04/15/18 1422    Visit Number  10    Number of Visits  12    Date for PT Re-Evaluation  06/13/18    Authorization Type  Progress note every 10th visit; KX modifier after 15th visit    PT Start Time  1345    PT Stop Time  1436    PT Time Calculation (min)  51 min    Activity Tolerance  Patient tolerated treatment well    Behavior During Therapy  [facility] for tasks assessed/performed       Past Medical History:  Diagnosis Date  . Cancer ([place])    lung  . Diabetes mellitus without complication ([place])   . Hyperlipidemia   . Hypertension   . Irregular heart rate    Dr. Victorino December note from 04/2017 states bradycardia, LBBB  . Pneumonia   . Renal insufficiency     Past Surgical History:  Procedure Laterality Date  . cyst removed    . ENDOBRONCHIAL ULTRASOUND Bilateral 03/23/2017   Procedure: ENDOBRONCHIAL ULTRASOUND;  Surgeon: Rigoberto Noel, MD;  [facility];  Service: Cardiopulmonary;  Laterality: Bilateral;  . EYE SURGERY    . MEDIASTINOSCOPY N/A 04/08/2017   Procedure: MEDIASTINOSCOPY;  Surgeon: Grace Isaac, MD;  Location: [place] [facility];  Service: Thoracic;  Laterality: N/A;  . MOUTH SURGERY    . ROTATOR CUFF REPAIR    . tumor remoced from neck    . VIDEO BRONCHOSCOPY WITH ENDOBRONCHIAL ULTRASOUND N/A 04/08/2017   Procedure: VIDEO BRONCHOSCOPY WITH ENDOBRONCHIAL ULTRASOUND;  Surgeon: Grace Isaac, MD;  Location: [facility];  Service: Thoracic;  Laterality: N/A;  . VIDEO BRONCHOSCOPY WITH ENDOBRONCHIAL ULTRASOUND N/A 12/08/2017   Procedure: VIDEO BRONCHOSCOPY with BRONCHIAL BIOPSIES AND ENDOBRONCHIAL ULTRASOUND WITH TRANSBRONCHIAL AND  BRONCHIAL BIOPSIES;  Surgeon: Grace Isaac, MD;  Location: [facility];  Service: Thoracic;  Laterality: N/A;    There were no vitals filed for this visit.  Subjective Assessment - 04/15/18 1346    Subjective  Patient arrived with some ongoing soreness yet improvement overall    Pertinent History  HTN, DM, Bradycardia, h/o sarcodosis    Limitations  Lifting;House hold activities    Diagnostic tests  none    Patient Stated Goals  get rid of pain and move better with more strength    Currently in Pain?  Yes    Pain Score  2     Pain Location  Shoulder    Pain Orientation  Left    Pain Descriptors / Indicators  Sore;Discomfort    Pain Type  Chronic pain    Pain Onset  More than a month ago    Pain Frequency  Intermittent    Aggravating Factors   reaching    Pain Relieving Factors  at rest         [facility] PT Assessment - 04/15/18 0001      ROM / Strength   AROM / PROM / Strength  AROM      AROM   AROM Assessment Site  Shoulder    Right/Left Shoulder  Left    Left Shoulder Extension  110 Degrees  [facility] Adult PT Treatment/Exercise - 04/15/18 0001      Shoulder Exercises: Supine   Protraction  Strengthening;Both;20 reps;Limitations;Other (comment)    Protraction Limitations  cane for AAROM For flexion then chest press 2x10 each    Other Supine Exercises  ABC's x1 rep      Shoulder Exercises: Seated   Elevation  Strengthening;Both;Limitations;Other (comment) 2x10    Elevation Limitations  with cane for assist      Shoulder Exercises: Standing   External Rotation  Limitations    Theraband Level (Shoulder External Rotation)  Other (comment) attempted yellow    External Rotation Limitations  unable to perform due to weakness    Extension  Strengthening;Left;20 reps;Theraband    Theraband Level (Shoulder Extension)  Level 2 (Red)    Row  Strengthening;20 reps;Theraband    Theraband Level (Shoulder Row)  Level 1 (Yellow)    Other Standing Exercises   wall side with right UE for assist 2x10      Shoulder Exercises: Pulleys   Flexion  5 minutes      Shoulder Exercises: ROM/Strengthening   UBE (Upper Arm Bike)  120 RPM x6' (3 fwd. 3 bwd)      Moist Heat Therapy   Number Minutes Moist Heat  15 Minutes    Moist Heat Location  Shoulder      Electrical Stimulation   Electrical Stimulation Location  L shoulder    Electrical Stimulation Action  IFC    Electrical Stimulation Parameters  1-10hz  x92min    Electrical Stimulation Goals  Pain      Manual Therapy   Manual Therapy  Passive ROM    Manual therapy comments  manual PROM then rhythmis stabs for left shoulder then eccentric lowring assit required                  PT Long Term Goals - 04/15/18 1348      PT LONG TERM GOAL #1   Title  Patient will be independent with HEP    Time  6    Period  Weeks    Status  On-going      PT LONG TERM GOAL #2   Title  Patient will report less than 3/10 pain in left shoulder with ADLs.     Time  6    Period  Weeks    Status  On-going      PT LONG TERM GOAL #3   Title  Patient will improve left shoulder flexion AROM to 115 or greater to improve functional activities.    Time  6    Status  On-going AROM 110 degrees 04/15/18      PT LONG TERM GOAL #4   Title  Patient will improve L shoulder MMT to 4/5 or greater to improve ability to perform functional activities and care for wife.    Time  6    Period  Weeks    Status  On-going      PT LONG TERM GOAL #5   Title  Patient will improve L functional ER to top of head in order to wash and style hair.    Time  6    Period  Weeks    Status  Achieved            Plan - 04/15/18 1424    Clinical Impression Statement  Patient tolerated treatment well today. Patient has improved with ROM and reported decreased discomfort overall. Patient has limitations with ER and flexion movements due  to pain and weakness. Today focused on eccentrics and spine to sitting prgression to help  strengthen within limitations. Goals ongoing at this time.     Rehab Potential  Good    PT Frequency  2x / week    PT Duration  6 weeks    PT Treatment/Interventions  Iontophoresis 4mg /ml Dexamethasone;Moist Heat;Traction;Cryotherapy;Electrical Stimulation;Therapeutic activities;Neuromuscular re-education;Patient/family education;Taping;Manual techniques;Passive range of motion    PT Next Visit Plan  cont with POC with strengthening (eccentrics with manual assist for ER/Flexion, pulleys, UE ranger, modalities PRN for pain relief.    Consulted and Agree with Plan of Care  Patient       Patient will benefit from skilled therapeutic intervention in order to improve the following deficits and impairments:  Decreased activity tolerance, Decreased strength, Decreased range of motion, Impaired UE functional use, Postural dysfunction, Pain  Visit Diagnosis: Left shoulder pain, unspecified chronicity  Muscle weakness (generalized)     Problem List Patient Active Problem List   Diagnosis Date Noted  . Sarcoidosis 04/06/2018  . CKD (chronic kidney disease) stage 4, GFR 15-29 ml/min (HCC) 04/06/2018  . Edema 02/23/2018  . Mediastinal lymphadenopathy 03/10/2017  . Anemia in stage 4 chronic kidney disease ([place]) 09/18/2016  . Vitamin D deficiency 09/18/2016  . Sinus bradycardia 08/31/2014  . First degree AV block 08/31/2014  . LBBB (left bundle branch block) 08/31/2014  . HTN (hypertension) 09/20/2013  . Diabetes ([place]) 05/02/2013  . Hyperlipemia 05/02/2013    Shanika Levings P , PTA 04/15/2018, 2:38 PM  [facility] [address] [place], [place], [address] Phone: [phone]   Fax:  [phone]  Name: Ruben Conner MRN: [id number] Date of Birth: 1930-05-09

## 2018-04-19 ENCOUNTER — Ambulatory Visit: Payer: PPO | Attending: Family Medicine | Admitting: Physical Therapy

## 2018-04-19 DIAGNOSIS — M6281 Muscle weakness (generalized): Secondary | ICD-10-CM | POA: Insufficient documentation

## 2018-04-19 DIAGNOSIS — M25512 Pain in left shoulder: Secondary | ICD-10-CM | POA: Insufficient documentation

## 2018-04-19 NOTE — Therapy (Signed)
[facility] [place], [place], [address] Phone: [phone]   Fax:  [phone]  Physical Therapy Treatment  Patient Details  Name: Ruben Conner MRN: [id number] Date of Birth: 01-02-1930 Referring Provider: Redge Gainer, MD   Encounter Date: 04/19/2018  PT End of Session - 04/19/18 1308    Visit Number  11    Number of Visits  18    Date for PT Re-Evaluation  06/13/18    Authorization Type  Progress note every 10th visit; KX modifier after 15th visit    PT Start Time  1300    PT Stop Time  1354    PT Time Calculation (min)  54 min    Activity Tolerance  Patient tolerated treatment well    Behavior During Therapy  [facility] for tasks assessed/performed       Past Medical History:  Diagnosis Date  . Cancer ([place])    lung  . Diabetes mellitus without complication ([place])   . Hyperlipidemia   . Hypertension   . Irregular heart rate    Dr. Victorino December note from 04/2017 states bradycardia, LBBB  . Pneumonia   . Renal insufficiency     Past Surgical History:  Procedure Laterality Date  . cyst removed    . ENDOBRONCHIAL ULTRASOUND Bilateral 03/23/2017   Procedure: ENDOBRONCHIAL ULTRASOUND;  Surgeon: Rigoberto Noel, MD;  Location: [facility];  Service: Cardiopulmonary;  Laterality: Bilateral;  . EYE SURGERY    . MEDIASTINOSCOPY N/A 04/08/2017   Procedure: MEDIASTINOSCOPY;  Surgeon: Grace Isaac, MD;  Location: [employer];  Service: Thoracic;  Laterality: N/A;  . MOUTH SURGERY    . ROTATOR CUFF REPAIR    . tumor remoced from neck    . VIDEO BRONCHOSCOPY WITH ENDOBRONCHIAL ULTRASOUND N/A 04/08/2017   Procedure: VIDEO BRONCHOSCOPY WITH ENDOBRONCHIAL ULTRASOUND;  Surgeon: Grace Isaac, MD;  Location: [place];  Service: Thoracic;  Laterality: N/A;  . VIDEO BRONCHOSCOPY WITH ENDOBRONCHIAL ULTRASOUND N/A 12/08/2017   Procedure: VIDEO BRONCHOSCOPY with BRONCHIAL BIOPSIES AND ENDOBRONCHIAL ULTRASOUND WITH TRANSBRONCHIAL AND BRONCHIAL  BIOPSIES;  Surgeon: Grace Isaac, MD;  Location: [place];  Service: Thoracic;  Laterality: N/A;    There were no vitals filed for this visit.  Subjective Assessment - 04/19/18 1313    Subjective  "My shoulder is not there yet."    Pertinent History  HTN, DM, Bradycardia, h/o sarcodosis    Limitations  Lifting;House hold activities    Diagnostic tests  none    Patient Stated Goals  get rid of pain and move better with more strength    Currently in Pain?  Yes    Pain Score  2     Pain Orientation  Left    Pain Descriptors / Indicators  Sore;Discomfort    Pain Type  Chronic pain    Pain Onset  More than a month ago         [facility] PT Assessment - 04/19/18 0001      Assessment   Medical Diagnosis  Left shoulder pain, unspecified chronicity      ROM / Strength   AROM / PROM / Strength  AROM      AROM   Left Shoulder Flexion  120 Degrees in supine    Left Shoulder ABduction  55 Degrees in supine    Left Shoulder External Rotation  55 Degrees in 45 degrees of abd      Strength   Left Shoulder Flexion  2+/5  Left Shoulder ABduction  2+/5    Left Shoulder Internal Rotation  3+/5    Left Shoulder External Rotation  4-/5                   [facility] Adult PT Treatment/Exercise - 04/19/18 0001      Shoulder Exercises: Supine   Protraction  Strengthening;Both;20 reps;Limitations;Other (comment)    Protraction Limitations  cane for AAROM    Other Supine Exercises  ABC's with Cane x1 rep      Shoulder Exercises: Standing   External Rotation  Strengthening;20 reps;Theraband    Theraband Level (Shoulder External Rotation)  Level 1 (Yellow)    External Rotation Limitations  VC and TC for form    Internal Rotation  Strengthening;Left;20 reps;Theraband    Theraband Level (Shoulder Internal Rotation)  Level 1 (Yellow)    Extension  Strengthening;Left;20 reps;Theraband    Theraband Level (Shoulder Extension)  Level 1 (Yellow)    Row  Strengthening;20 reps;Theraband     Theraband Level (Shoulder Row)  Level 1 (Yellow)      Shoulder Exercises: Pulleys   Flexion  5 minutes      Shoulder Exercises: ROM/Strengthening   UBE (Upper Arm Bike)  120 RPM x8' (4 fwd. 4 bwd)      Acupuncturist Location  L shoulder    Electrical Stimulation Action  IFC    Electrical Stimulation Parameters  1-10 hz x15 min    Electrical Stimulation Goals  Pain                  PT Long Term Goals - 04/15/18 1348      PT LONG TERM GOAL #1   Title  Patient will be independent with HEP    Time  6    Period  Weeks    Status  On-going      PT LONG TERM GOAL #2   Title  Patient will report less than 3/10 pain in left shoulder with ADLs.     Time  6    Period  Weeks    Status  On-going      PT LONG TERM GOAL #3   Title  Patient will improve left shoulder flexion AROM to 115 or greater to improve functional activities.    Time  6    Status  On-going AROM 110 degrees 04/15/18      PT LONG TERM GOAL #4   Title  Patient will improve L shoulder MMT to 4/5 or greater to improve ability to perform functional activities and care for wife.    Time  6    Period  Weeks    Status  On-going      PT LONG TERM GOAL #5   Title  Patient will improve L functional ER to top of head in order to wash and style hair.    Time  6    Period  Weeks    Status  Achieved            Plan - 04/19/18 1340    Clinical Impression Statement  Patient was able to complete treatment well however patient is still limited with AROM secondary to pain. Patient required tactile cuing and verbal cuing for proper form with Tband ER. Patient overall has made improvement with flexion; ABD and ER AROM is still limited secondary to pain. Normal response to modalities upon removal. Recert sent to MD for more visits.    Clinical Presentation  Evolving    Clinical Decision Making  Moderate    Rehab Potential  Good    PT Frequency  2x / week    PT Duration  6 weeks     PT Treatment/Interventions  Iontophoresis 4mg /ml Dexamethasone;Moist Heat;Traction;Cryotherapy;Electrical Stimulation;Therapeutic activities;Neuromuscular re-education;Patient/family education;Taping;Manual techniques;Passive range of motion    PT Next Visit Plan  cont with POC with strengthening (eccentrics with manual assist for ER/Flexion,) pulleys, UE ranger, modalities PRN for pain relief.    Consulted and Agree with Plan of Care  Patient       Patient will benefit from skilled therapeutic intervention in order to improve the following deficits and impairments:  Decreased activity tolerance, Decreased strength, Decreased range of motion, Impaired UE functional use, Postural dysfunction, Pain  Visit Diagnosis: Left shoulder pain, unspecified chronicity - Plan: PT plan of care cert/re-cert  Muscle weakness (generalized) - Plan: PT plan of care cert/re-cert     Problem List Patient Active Problem List   Diagnosis Date Noted  . Sarcoidosis 04/06/2018  . CKD (chronic kidney disease) stage 4, GFR 15-29 ml/min (HCC) 04/06/2018  . Edema 02/23/2018  . Mediastinal lymphadenopathy 03/10/2017  . Anemia in stage 4 chronic kidney disease ([place]) 09/18/2016  . Vitamin D deficiency 09/18/2016  . Sinus bradycardia 08/31/2014  . First degree AV block 08/31/2014  . LBBB (left bundle branch block) 08/31/2014  . HTN (hypertension) 09/20/2013  . Diabetes ([place]) 05/02/2013  . Hyperlipemia 05/02/2013    Gabriela Eves, PT, DPT 04/19/2018, 2:13 PM  [facility] [address] [place], [place], [address] Phone: [phone]   Fax:  [phone]  Name: VANDER KUEKER MRN: [id number] Date of Birth: July 10, 1930

## 2018-04-22 ENCOUNTER — Ambulatory Visit: Payer: PPO | Admitting: Physical Therapy

## 2018-04-22 ENCOUNTER — Other Ambulatory Visit: Payer: Self-pay | Admitting: Adult Health

## 2018-04-22 DIAGNOSIS — M6281 Muscle weakness (generalized): Secondary | ICD-10-CM

## 2018-04-22 DIAGNOSIS — M25512 Pain in left shoulder: Secondary | ICD-10-CM

## 2018-04-22 NOTE — Therapy (Signed)
[facility] [place], [place], [address] Phone: [phone]   Fax:  [phone]  Physical Therapy Treatment  Patient Details  Name: Ruben Conner MRN: [id number] Date of Birth: [birth year] Referring Provider: Redge Gainer, MD   Encounter Date: 04/22/2018  PT End of Session - 04/22/18 1350    Visit Number  12    Number of Visits  18    Date for PT Re-Evaluation  06/13/18    Authorization Type  Progress note every 10th visit; KX modifier after 15th visit    PT Start Time  1345    PT Stop Time  1436    PT Time Calculation (min)  51 min    Activity Tolerance  Patient tolerated treatment well    Behavior During Therapy  [facility] for tasks assessed/performed       Past Medical History:  Diagnosis Date  . Cancer ([place])    lung  . Diabetes mellitus without complication ([place])   . Hyperlipidemia   . Hypertension   . Irregular heart rate    Dr. Victorino December note from 04/2017 states bradycardia, LBBB  . Pneumonia   . Renal insufficiency     Past Surgical History:  Procedure Laterality Date  . cyst removed    . ENDOBRONCHIAL ULTRASOUND Bilateral 03/23/2017   Procedure: ENDOBRONCHIAL ULTRASOUND;  Surgeon: Rigoberto Noel, MD;  Location: [facility];  Service: Cardiopulmonary;  Laterality: Bilateral;  . EYE SURGERY    . MEDIASTINOSCOPY N/A 04/08/2017   Procedure: MEDIASTINOSCOPY;  Surgeon: Grace Isaac, MD;  Location: [place];  Service: Thoracic;  Laterality: N/A;  . MOUTH SURGERY    . ROTATOR CUFF REPAIR    . tumor remoced from neck    . VIDEO BRONCHOSCOPY WITH ENDOBRONCHIAL ULTRASOUND N/A 04/08/2017   Procedure: VIDEO BRONCHOSCOPY WITH ENDOBRONCHIAL ULTRASOUND;  Surgeon: Grace Isaac, MD;  Location: [place];  Service: Thoracic;  Laterality: N/A;  . VIDEO BRONCHOSCOPY WITH ENDOBRONCHIAL ULTRASOUND N/A 12/08/2017   Procedure: VIDEO BRONCHOSCOPY with BRONCHIAL BIOPSIES AND ENDOBRONCHIAL ULTRASOUND WITH TRANSBRONCHIAL AND BRONCHIAL  BIOPSIES;  Surgeon: Grace Isaac, MD;  Location: [place];  Service: Thoracic;  Laterality: N/A;    There were no vitals filed for this visit.                    [facility] Adult PT Treatment/Exercise - 04/22/18 0001      Shoulder Exercises: Supine   Protraction  Strengthening;Both;20 reps;Limitations;Other (comment)    Protraction Limitations  cane for AAROM      Shoulder Exercises: Standing   Protraction  Left;20 reps;10 reps;Theraband    Theraband Level (Shoulder Protraction)  Level 1 (Yellow)    Flexion  AAROM;Left;20 reps    Row  Strengthening;20 reps;Theraband    Theraband Level (Shoulder Row)  Level 1 (Yellow)    Other Standing Exercises  wall ladder x10 to 30    Other Standing Exercises  behind back IR with cane x20      Shoulder Exercises: Pulleys   Flexion  5 minutes      Shoulder Exercises: ROM/Strengthening   UBE (Upper Arm Bike)  120 RPM x8' (4 fwd. 4 bwd)    Wall Wash  flexion, clockwise, counterclock wise x20      Moist Heat Therapy   Number Minutes Moist Heat  15 Minutes    Moist Heat Location  Shoulder      Electrical Stimulation   Electrical Stimulation Location  L shoulder  Electrical Stimulation Action  IFC    Electrical Stimulation Parameters  1-10 hz x15    Electrical Stimulation Goals  Pain                  PT Long Term Goals - 04/15/18 1348      PT LONG TERM GOAL #1   Title  Patient will be independent with HEP    Time  6    Period  Weeks    Status  On-going      PT LONG TERM GOAL #2   Title  Patient will report less than 3/10 pain in left shoulder with ADLs.     Time  6    Period  Weeks    Status  On-going      PT LONG TERM GOAL #3   Title  Patient will improve left shoulder flexion AROM to 115 or greater to improve functional activities.    Time  6    Status  On-going AROM 110 degrees 04/15/18      PT LONG TERM GOAL #4   Title  Patient will improve L shoulder MMT to 4/5 or greater to improve ability to  perform functional activities and care for wife.    Time  6    Period  Weeks    Status  On-going      PT LONG TERM GOAL #5   Title  Patient will improve L functional ER to top of head in order to wash and style hair.    Time  6    Period  Weeks    Status  Achieved            Plan - 04/22/18 1643    Clinical Impression Statement  Patient was able to complete progression of exercises with minimal reports of pain. Patient continues to have difficulties with ER strength noted by 2+/5 MMT strength. Continue with strengthening exercises to improve functional tasks. Normal response to modalities upon removal.    Clinical Presentation  Evolving    Clinical Decision Making  Moderate    Rehab Potential  Good    PT Frequency  2x / week    PT Duration  6 weeks    PT Treatment/Interventions  Iontophoresis 4mg /ml Dexamethasone;Moist Heat;Traction;Cryotherapy;Electrical Stimulation;Therapeutic activities;Neuromuscular re-education;Patient/family education;Taping;Manual techniques;Passive range of motion    PT Next Visit Plan  cont with POC with strengthening (eccentrics with manual assist for ER/Flexion,) pulleys, UE ranger, modalities PRN for pain relief.    Consulted and Agree with Plan of Care  Patient       Patient will benefit from skilled therapeutic intervention in order to improve the following deficits and impairments:  Decreased activity tolerance, Decreased strength, Decreased range of motion, Impaired UE functional use, Postural dysfunction, Pain  Visit Diagnosis: Left shoulder pain, unspecified chronicity  Muscle weakness (generalized)     Problem List Patient Active Problem List   Diagnosis Date Noted  . Sarcoidosis 04/06/2018  . CKD (chronic kidney disease) stage 4, GFR 15-29 ml/min (HCC) 04/06/2018  . Edema 02/23/2018  . Mediastinal lymphadenopathy 03/10/2017  . Anemia in stage 4 chronic kidney disease ([place]) 09/18/2016  . Vitamin D deficiency 09/18/2016  . Sinus  bradycardia 08/31/2014  . First degree AV block 08/31/2014  . LBBB (left bundle branch block) 08/31/2014  . HTN (hypertension) 09/20/2013  . Diabetes ([place]) 05/02/2013  . Hyperlipemia 05/02/2013   Gabriela Eves, PT, DPT 04/22/2018, 4:52 PM  [facility] [place],  [place], [address] Phone: [phone]   Fax:  [phone]  Name: Ruben Conner MRN: [id number] Date of Birth: June 03, 1930

## 2018-04-22 NOTE — Telephone Encounter (Signed)
On last AVS it stated that based off of the Ct we would decided if we would continue with predisone or not.  Dr. Elsworth Soho please advise. Thank you

## 2018-04-23 NOTE — Telephone Encounter (Signed)
Please refill and give him 90 tablets Please ensure follow-up office visit in 2 months

## 2018-04-26 ENCOUNTER — Encounter: Payer: Self-pay | Admitting: Physical Therapy

## 2018-04-26 ENCOUNTER — Ambulatory Visit: Payer: PPO | Admitting: Physical Therapy

## 2018-04-26 DIAGNOSIS — M25512 Pain in left shoulder: Secondary | ICD-10-CM | POA: Diagnosis not present

## 2018-04-26 DIAGNOSIS — M6281 Muscle weakness (generalized): Secondary | ICD-10-CM

## 2018-04-26 NOTE — Therapy (Signed)
[facility] [place], [place], [address] Phone: [phone]   Fax:  [phone]  Physical Therapy Treatment  Patient Details  Name: Ruben Conner MRN: [id number] Date of Birth: 1929-11-23 Referring Provider: Redge Gainer, MD   Encounter Date: 04/26/2018  PT End of Session - 04/26/18 1300    Visit Number  13    Number of Visits  18    Date for PT Re-Evaluation  06/13/18    Authorization Type  Progress note every 10th visit; KX modifier after 15th visit    PT Start Time  1302    PT Stop Time  1348    PT Time Calculation (min)  46 min    Activity Tolerance  Patient tolerated treatment well    Behavior During Therapy  [facility] for tasks assessed/performed       Past Medical History:  Diagnosis Date  . Cancer ([place])    lung  . Diabetes mellitus without complication ([place])   . Hyperlipidemia   . Hypertension   . Irregular heart rate    Dr. Victorino December note from 04/2017 states bradycardia, LBBB  . Pneumonia   . Renal insufficiency     Past Surgical History:  Procedure Laterality Date  . cyst removed    . ENDOBRONCHIAL ULTRASOUND Bilateral 03/23/2017   Procedure: ENDOBRONCHIAL ULTRASOUND;  Surgeon: Rigoberto Noel, MD;  [facility]: [facility];  Service: Cardiopulmonary;  Laterality: Bilateral;  . EYE SURGERY    . MEDIASTINOSCOPY N/A 04/08/2017   Procedure: MEDIASTINOSCOPY;  Surgeon: Grace Isaac, MD;  Location: [place];  Service: Thoracic;  Laterality: N/A;  . MOUTH SURGERY    . ROTATOR CUFF REPAIR    . tumor remoced from neck    . VIDEO BRONCHOSCOPY WITH ENDOBRONCHIAL ULTRASOUND N/A 04/08/2017   Procedure: VIDEO BRONCHOSCOPY WITH ENDOBRONCHIAL ULTRASOUND;  Surgeon: Grace Isaac, MD;  Location: [place];  Service: Thoracic;  Laterality: N/A;  . VIDEO BRONCHOSCOPY WITH ENDOBRONCHIAL ULTRASOUND N/A 12/08/2017   Procedure: VIDEO BRONCHOSCOPY with BRONCHIAL BIOPSIES AND ENDOBRONCHIAL ULTRASOUND WITH TRANSBRONCHIAL AND  BRONCHIAL BIOPSIES;  Surgeon: Grace Isaac, MD;  Location: [place];  Service: Thoracic;  Laterality: N/A;    There were no vitals filed for this visit.  Subjective Assessment - 04/26/18 1300    Subjective  Reports that he is using his shoulder a lot more and has had some soreness.    Pertinent History  HTN, DM, Bradycardia, h/o sarcodosis    Limitations  Lifting;House hold activities    Diagnostic tests  none    Patient Stated Goals  get rid of pain and move better with more strength    Currently in Pain?  Yes    Pain Score  3     Pain Location  Shoulder    Pain Orientation  Left    Pain Descriptors / Indicators  Sore    Pain Type  Chronic pain    Pain Onset  More than a month ago         [facility] PT Assessment - 04/26/18 0001      Assessment   Medical Diagnosis  Left shoulder pain, unspecified chronicity    Hand Dominance  Right    Next MD Visit  May    Prior Therapy  yes, previous Left RTC                    [facility] Adult PT Treatment/Exercise - 04/26/18 0001      Shoulder Exercises: Pulleys  Flexion  5 minutes      Shoulder Exercises: ROM/Strengthening   UBE (Upper Arm Bike)  90 RPM x8' (4 fwd. 4 bwd)    Wall Wash  Flexion, CW and CCW circles x10 reps; limited by pain      Modalities   Modalities  Electrical Stimulation;Moist Heat;Ultrasound      Moist Heat Therapy   Number Minutes Moist Heat  15 Minutes    Moist Heat Location  Shoulder      Electrical Stimulation   Electrical Stimulation Location  L shoulder    Electrical Stimulation Action  IFC    Electrical Stimulation Parameters  1-10 h x15 min    Electrical Stimulation Goals  Pain      Ultrasound   Ultrasound Location  L posterior shoulder     Ultrasound Parameters  1.2 w/cm2, 100%, 1 mhz x10 min    Ultrasound Goals  Pain                  PT Long Term Goals - 04/15/18 1348      PT LONG TERM GOAL #1   Title  Patient will be independent with HEP    Time  6    Period  Weeks     Status  On-going      PT LONG TERM GOAL #2   Title  Patient will report less than 3/10 pain in left shoulder with ADLs.     Time  6    Period  Weeks    Status  On-going      PT LONG TERM GOAL #3   Title  Patient will improve left shoulder flexion AROM to 115 or greater to improve functional activities.    Time  6    Status  On-going AROM 110 degrees 04/15/18      PT LONG TERM GOAL #4   Title  Patient will improve L shoulder MMT to 4/5 or greater to improve ability to perform functional activities and care for wife.    Time  6    Period  Weeks    Status  On-going      PT LONG TERM GOAL #5   Title  Patient will improve L functional ER to top of head in order to wash and style hair.    Time  6    Period  Weeks    Status  Achieved            Plan - 04/26/18 1344    Clinical Impression Statement  Patient arrived in clinic with reports of increased L shoulder soreness for unknown reason. Patient limited with therex session secondary to increased L shoulder pain. [place] completed to L posterior cuff and shoulder region as patient correlated soreness posterior L shoulder. No complaints during conservative aspect of treatment. Normal modalities response noted following removal of the modalities.    Rehab Potential  Good    PT Frequency  2x / week    PT Duration  6 weeks    PT Treatment/Interventions  Iontophoresis 4mg /ml Dexamethasone;Moist Heat;Traction;Cryotherapy;Electrical Stimulation;Therapeutic activities;Neuromuscular re-education;Patient/family education;Taping;Manual techniques;Passive range of motion    PT Next Visit Plan  cont with POC with strengthening (eccentrics with manual assist for ER/Flexion,) pulleys, UE ranger, modalities PRN for pain relief.    PT Home Exercise Plan  scapular retractions, AAROM scaption with cane, corner stretch    Consulted and Agree with Plan of Care  Patient       Patient will benefit from skilled therapeutic  intervention in order to improve  the following deficits and impairments:  Decreased activity tolerance, Decreased strength, Decreased range of motion, Impaired UE functional use, Postural dysfunction, Pain  Visit Diagnosis: Left shoulder pain, unspecified chronicity  Muscle weakness (generalized)     Problem List Patient Active Problem List   Diagnosis Date Noted  . Sarcoidosis 04/06/2018  . CKD (chronic kidney disease) stage 4, GFR 15-29 ml/min (HCC) 04/06/2018  . Edema 02/23/2018  . Mediastinal lymphadenopathy 03/10/2017  . Anemia in stage 4 chronic kidney disease ([place]) 09/18/2016  . Vitamin D deficiency 09/18/2016  . Sinus bradycardia 08/31/2014  . First degree AV block 08/31/2014  . LBBB (left bundle branch block) 08/31/2014  . HTN (hypertension) 09/20/2013  . Diabetes ([place]) 05/02/2013  . Hyperlipemia 05/02/2013    Standley Brooking, PTA 04/26/2018, 2:36 PM  [facility] [address] [place], [place], [address] Phone: [phone]   Fax:  [phone]  Name: Ruben Conner MRN: [id number] Date of Birth: 1930-01-24

## 2018-04-29 ENCOUNTER — Ambulatory Visit: Payer: PPO | Admitting: *Deleted

## 2018-04-29 DIAGNOSIS — M6281 Muscle weakness (generalized): Secondary | ICD-10-CM

## 2018-04-29 DIAGNOSIS — M25512 Pain in left shoulder: Secondary | ICD-10-CM

## 2018-04-29 NOTE — Therapy (Signed)
[facility] [place], [place], [address] Phone: [phone]   Fax:  [phone]  Physical Therapy Treatment  Patient Details  Name: Ruben Conner MRN: [id number] Date of Birth: 02-28-1930 Referring Provider: Redge Gainer, MD   Encounter Date: 04/29/2018  PT End of Session - 04/29/18 1303    Visit Number  14    Number of Visits  18    Date for PT Re-Evaluation  06/13/18    Authorization Type  Progress note every 10th visit; KX modifier after 15th visit    PT Start Time  1300    PT Stop Time  1351    PT Time Calculation (min)  51 min       Past Medical History:  Diagnosis Date  . Cancer ([place])    lung  . Diabetes mellitus without complication ([place])   . Hyperlipidemia   . Hypertension   . Irregular heart rate    Dr. Victorino December note from 04/2017 states bradycardia, LBBB  . Pneumonia   . Renal insufficiency     Past Surgical History:  Procedure Laterality Date  . cyst removed    . ENDOBRONCHIAL ULTRASOUND Bilateral 03/23/2017   Procedure: ENDOBRONCHIAL ULTRASOUND;  Surgeon: Rigoberto Noel, MD;  Location: [facility];  Service: Cardiopulmonary;  Laterality: Bilateral;  . EYE SURGERY    . MEDIASTINOSCOPY N/A 04/08/2017   Procedure: MEDIASTINOSCOPY;  Surgeon: Grace Isaac, MD;  Location: [facility];  Service: Thoracic;  Laterality: N/A;  . MOUTH SURGERY    . ROTATOR CUFF REPAIR    . tumor remoced from neck    . VIDEO BRONCHOSCOPY WITH ENDOBRONCHIAL ULTRASOUND N/A 04/08/2017   Procedure: VIDEO BRONCHOSCOPY WITH ENDOBRONCHIAL ULTRASOUND;  Surgeon: Grace Isaac, MD;  Location: [facility];  Service: Thoracic;  Laterality: N/A;  . VIDEO BRONCHOSCOPY WITH ENDOBRONCHIAL ULTRASOUND N/A 12/08/2017   Procedure: VIDEO BRONCHOSCOPY with BRONCHIAL BIOPSIES AND ENDOBRONCHIAL ULTRASOUND WITH TRANSBRONCHIAL AND BRONCHIAL BIOPSIES;  Surgeon: Grace Isaac, MD;  Location: [place];  Service: Thoracic;  Laterality: N/A;    There were  no vitals filed for this visit.  Subjective Assessment - 04/29/18 1302    Subjective  Reports that he is using his shoulder a lot more and has had some soreness. Doing better after last Rx.    Pertinent History  HTN, DM, Bradycardia, h/o sarcodosis    Limitations  Lifting;House hold activities    Diagnostic tests  none    Patient Stated Goals  get rid of pain and move better with more strength    Currently in Pain?  Yes    Pain Orientation  Left    Pain Descriptors / Indicators  Sore    Pain Type  Chronic pain    Pain Onset  More than a month ago    Pain Frequency  Intermittent                       [facility] Adult PT Treatment/Exercise - 04/29/18 0001      Shoulder Exercises: Standing   Extension  Strengthening;Left;20 reps;Theraband;10 reps    Theraband Level (Shoulder Extension)  Level 1 (Yellow)      Shoulder Exercises: Pulleys   Flexion  5 minutes      Shoulder Exercises: ROM/Strengthening   UBE (Upper Arm Bike)  90 RPM x8' (4 fwd. 4 bwd)      Modalities   Modalities  Electrical Stimulation;Moist Heat;Ultrasound      Moist Heat Therapy  Number Minutes Moist Heat  15 Minutes      Electrical Stimulation   Electrical Stimulation Location  L shoulder IFC x 15 mins 80-150hz     Electrical Stimulation Goals  Pain      Manual Therapy   Manual therapy comments  manual flexion AAROM then eccentric lowring assit required    Passive ROM  PROM/AAROM for LT shldr flexion F/B Rhythmic  stab at 100 degrees flexion.Sinclair Ship  Flexion in standing      Attempted ER yellow tband , but unable to perform good technique            PT Long Term Goals - 04/15/18 1348      PT LONG TERM GOAL #1   Title  Patient will be independent with HEP    Time  6    Period  Weeks    Status  On-going      PT LONG TERM GOAL #2   Title  Patient will report less than 3/10 pain in left shoulder with ADLs.     Time  6    Period  Weeks    Status  On-going      PT LONG TERM GOAL  #3   Title  Patient will improve left shoulder flexion AROM to 115 or greater to improve functional activities.    Time  6    Status  On-going AROM 110 degrees 04/15/18      PT LONG TERM GOAL #4   Title  Patient will improve L shoulder MMT to 4/5 or greater to improve ability to perform functional activities and care for wife.    Time  6    Period  Weeks    Status  On-going      PT LONG TERM GOAL #5   Title  Patient will improve L functional ER to top of head in order to wash and style hair.    Time  6    Period  Weeks    Status  Achieved            Plan - 04/29/18 1341    Clinical Impression Statement  Pt arrived today doing better with decreased LT shldr pain after last Rx. He did wellwith pulleys and other AAROM exs, but still unable to actively elevate LT UE antigravity through  available ROM.. In standing pt was able to raise LT UE to 70 degrees and still unable to flex in supine as well.  ER with yellow tband was attempted, but ER is still very weak 3/5, and 3-/5 for flexion. PROM to 130 degrees in flexion and 70 degrees for ER.Marland Kitchen Pt did do well with Rhythmic stab for elevation and IR/ER.    Clinical Presentation  Evolving    Clinical Decision Making  Moderate    Rehab Potential  Good    PT Frequency  2x / week    PT Duration  6 weeks    PT Treatment/Interventions  Iontophoresis 4mg /ml Dexamethasone;Moist Heat;Traction;Cryotherapy;Electrical Stimulation;Therapeutic activities;Neuromuscular re-education;Patient/family education;Taping;Manual techniques;Passive range of motion    PT Next Visit Plan  cont with POC with strengthening (eccentrics with manual assist for ER/Flexion,) pulleys, UE ranger, modalities PRN for pain relief.    PT Home Exercise Plan  scapular retractions, AAROM scaption with cane, corner stretch    Consulted and Agree with Plan of Care  Patient       Patient will benefit from skilled therapeutic intervention in order to improve the following deficits and  impairments:  Decreased activity tolerance,  Decreased strength, Decreased range of motion, Impaired UE functional use, Postural dysfunction, Pain  Visit Diagnosis: Left shoulder pain, unspecified chronicity  Muscle weakness (generalized)     Problem List Patient Active Problem List   Diagnosis Date Noted  . Sarcoidosis 04/06/2018  . CKD (chronic kidney disease) stage 4, GFR 15-29 ml/min (HCC) 04/06/2018  . Edema 02/23/2018  . Mediastinal lymphadenopathy 03/10/2017  . Anemia in stage 4 chronic kidney disease ([place]) 09/18/2016  . Vitamin D deficiency 09/18/2016  . Sinus bradycardia 08/31/2014  . First degree AV block 08/31/2014  . LBBB (left bundle branch block) 08/31/2014  . HTN (hypertension) 09/20/2013  . Diabetes ([place]) 05/02/2013  . Hyperlipemia 05/02/2013    Isao Seltzer,CHRIS, PTA 04/29/2018, 1:51 PM  [facility] [place], [place], [address] Phone: [phone]   Fax:  [phone]  Name: GIULIO BERTINO MRN: [id number] Date of Birth: 09-23-1930

## 2018-05-03 ENCOUNTER — Encounter: Payer: Self-pay | Admitting: Physical Therapy

## 2018-05-03 ENCOUNTER — Ambulatory Visit: Payer: PPO | Admitting: Physical Therapy

## 2018-05-03 DIAGNOSIS — M6281 Muscle weakness (generalized): Secondary | ICD-10-CM

## 2018-05-03 DIAGNOSIS — M25512 Pain in left shoulder: Secondary | ICD-10-CM

## 2018-05-03 NOTE — Therapy (Signed)
[facility] [place], [place], [address] Phone: [phone]   Fax:  [phone]  Physical Therapy Treatment  Patient Details  Name: Ruben Conner MRN: [id number] Date of Birth: 08/06/1930 Referring Provider: Redge Gainer, MD   Encounter Date: 05/03/2018  PT End of Session - 05/03/18 1303    Visit Number  15    Number of Visits  18    Date for PT Re-Evaluation  06/13/18    Authorization Type  Progress note every 10th visit; KX modifier after 15th visit    PT Start Time  1300    PT Stop Time  1349    PT Time Calculation (min)  49 min    Activity Tolerance  Patient tolerated treatment well    Behavior During Therapy  [facility] for tasks assessed/performed       Past Medical History:  Diagnosis Date  . Cancer ([place])    lung  . Diabetes mellitus without complication ([place])   . Hyperlipidemia   . Hypertension   . Irregular heart rate    Dr. Victorino December note from 04/2017 states bradycardia, LBBB  . Pneumonia   . Renal insufficiency     Past Surgical History:  Procedure Laterality Date  . cyst removed    . ENDOBRONCHIAL ULTRASOUND Bilateral 03/23/2017   Procedure: ENDOBRONCHIAL ULTRASOUND;  Surgeon: Rigoberto Noel, MD;  Location: [facility];  Service: Cardiopulmonary;  Laterality: Bilateral;  . EYE SURGERY    . MEDIASTINOSCOPY N/A 04/08/2017   Procedure: MEDIASTINOSCOPY;  Surgeon: Grace Isaac, MD;  Location: [facility];  Service: Thoracic;  Laterality: N/A;  . MOUTH SURGERY    . ROTATOR CUFF REPAIR    . tumor remoced from neck    . VIDEO BRONCHOSCOPY WITH ENDOBRONCHIAL ULTRASOUND N/A 04/08/2017   Procedure: VIDEO BRONCHOSCOPY WITH ENDOBRONCHIAL ULTRASOUND;  Surgeon: Grace Isaac, MD;  Location: [facility];  Service: Thoracic;  Laterality: N/A;  . VIDEO BRONCHOSCOPY WITH ENDOBRONCHIAL ULTRASOUND N/A 12/08/2017   Procedure: VIDEO BRONCHOSCOPY with BRONCHIAL BIOPSIES AND ENDOBRONCHIAL ULTRASOUND WITH TRANSBRONCHIAL AND  BRONCHIAL BIOPSIES;  Surgeon: Grace Isaac, MD;  Location: [facility];  Service: Thoracic;  Laterality: N/A;    There were no vitals filed for this visit.  Subjective Assessment - 05/03/18 1303    Subjective  Patient reports improvements in left shoulder motion however still reports soreness with activities.    Pertinent History  HTN, DM, Bradycardia, h/o sarcodosis    Limitations  Lifting;House hold activities    Diagnostic tests  none    Patient Stated Goals  get rid of pain and move better with more strength    Currently in Pain?  Yes    Pain Score  2     Pain Location  Shoulder    Pain Orientation  Left    Pain Descriptors / Indicators  Sore    Pain Type  Chronic pain    Pain Onset  More than a month ago         [facility] PT Assessment - 05/03/18 0001      Assessment   Medical Diagnosis  Left shoulder pain, unspecified chronicity    Hand Dominance  Right    Next MD Visit  May    Prior Therapy  yes, previous Left RTC                    [facility] Adult PT Treatment/Exercise - 05/03/18 0001      Shoulder Exercises: Supine  Protraction  Strengthening;Both;20 reps;Limitations;Other (comment)    Protraction Limitations  PVC for AAROM    Flexion  AAROM;Both;20 reps;Limitations    Flexion Limitations  PVC for AAROM    Diagonals  AAROM;Left;20 reps;Limitations PVC for AAROM      Shoulder Exercises: Standing   Other Standing Exercises  cone lifts to bottom shelf x20 attempted with mug but unable      Shoulder Exercises: Pulleys   Flexion  5 minutes      Shoulder Exercises: ROM/Strengthening   UBE (Upper Arm Bike)  90 RPM x8' (4 fwd. 4 bwd)    Ranger  flexion, circles, clockwise, counterclock wise x20 each      Modalities   Modalities  Electrical Stimulation;Moist Heat;Ultrasound      Moist Heat Therapy   Number Minutes Moist Heat  15 Minutes    Moist Heat Location  Shoulder      Electrical Stimulation   Electrical Stimulation Location  L shoulder     Electrical Stimulation Action  IFC    Electrical Stimulation Parameters  80-150 hz x15 min    Electrical Stimulation Goals  Pain      Manual Therapy   Manual Therapy  Passive ROM    Passive ROM   Rhythmic stab at 100 degrees flexion with assist for eccentric control lowering                  PT Long Term Goals - 04/15/18 1348      PT LONG TERM GOAL #1   Title  Patient will be independent with HEP    Time  6    Period  Weeks    Status  On-going      PT LONG TERM GOAL #2   Title  Patient will report less than 3/10 pain in left shoulder with ADLs.     Time  6    Period  Weeks    Status  On-going      PT LONG TERM GOAL #3   Title  Patient will improve left shoulder flexion AROM to 115 or greater to improve functional activities.    Time  6    Status  On-going AROM 110 degrees 04/15/18      PT LONG TERM GOAL #4   Title  Patient will improve L shoulder MMT to 4/5 or greater to improve ability to perform functional activities and care for wife.    Time  6    Period  Weeks    Status  On-going      PT LONG TERM GOAL #5   Title  Patient will improve L functional ER to top of head in order to wash and style hair.    Time  6    Period  Weeks    Status  Achieved            Plan - 05/03/18 1359    Clinical Impression Statement  Patient was able to tolerate treatment well despite mininal reports of pain. Patient continues to have some difficulties with ADLs secondary to pain but has reported significant improvements since start of physical therapy. Patient and PT discussed finishing remaining visits then DC with emphasis on HEP. Patient reported agreement. Normal response to modalities upon removal.    Clinical Presentation  Evolving    Clinical Decision Making  Moderate    Rehab Potential  Good    PT Frequency  2x / week    PT Duration  6 weeks  PT Treatment/Interventions  Iontophoresis 4mg /ml Dexamethasone;Moist Heat;Traction;Cryotherapy;Electrical  Stimulation;Therapeutic activities;Neuromuscular re-education;Patient/family education;Taping;Manual techniques;Passive range of motion    PT Next Visit Plan  cont with POC with strengthening (eccentrics with manual assist for ER/Flexion,) pulleys, UE ranger, modalities PRN for pain relief.    Consulted and Agree with Plan of Care  Patient       Patient will benefit from skilled therapeutic intervention in order to improve the following deficits and impairments:  Decreased activity tolerance, Decreased strength, Decreased range of motion, Impaired UE functional use, Postural dysfunction, Pain  Visit Diagnosis: Left shoulder pain, unspecified chronicity  Muscle weakness (generalized)     Problem List Patient Active Problem List   Diagnosis Date Noted  . Sarcoidosis 04/06/2018  . CKD (chronic kidney disease) stage 4, GFR 15-29 ml/min (HCC) 04/06/2018  . Edema 02/23/2018  . Mediastinal lymphadenopathy 03/10/2017  . Anemia in stage 4 chronic kidney disease ([place]) 09/18/2016  . Vitamin D deficiency 09/18/2016  . Sinus bradycardia 08/31/2014  . First degree AV block 08/31/2014  . LBBB (left bundle branch block) 08/31/2014  . HTN (hypertension) 09/20/2013  . Diabetes ([place]) 05/02/2013  . Hyperlipemia 05/02/2013   Gabriela Eves, PT, DPT 05/03/2018, 8:11 PM  [facility] [address] [place], [place], [address] Phone: [phone]   Fax:  [phone]  Name: Ruben Conner MRN: [id number] Date of Birth: 07/12/1930

## 2018-05-06 ENCOUNTER — Ambulatory Visit: Payer: PPO | Admitting: Physical Therapy

## 2018-05-06 ENCOUNTER — Other Ambulatory Visit: Payer: PPO

## 2018-05-06 DIAGNOSIS — Z7689 Persons encountering health services in other specified circumstances: Secondary | ICD-10-CM | POA: Diagnosis not present

## 2018-05-06 DIAGNOSIS — N183 Chronic kidney disease, stage 3 (moderate): Secondary | ICD-10-CM | POA: Diagnosis not present

## 2018-05-10 ENCOUNTER — Encounter: Payer: PPO | Admitting: Physical Therapy

## 2018-05-11 ENCOUNTER — Ambulatory Visit (INDEPENDENT_AMBULATORY_CARE_PROVIDER_SITE_OTHER): Payer: PPO | Admitting: Family Medicine

## 2018-05-11 ENCOUNTER — Encounter: Payer: Self-pay | Admitting: Family Medicine

## 2018-05-11 VITALS — BP 138/73 | HR 88 | Temp 97.7°F | Ht 72.0 in | Wt 201.0 lb

## 2018-05-11 DIAGNOSIS — R42 Dizziness and giddiness: Secondary | ICD-10-CM | POA: Diagnosis not present

## 2018-05-11 NOTE — Progress Notes (Signed)
BP 138/73   Pulse 88   Temp 97.7 F (36.5 C) (Oral)   Ht 6' (1.829 m)   Wt 201 lb (91.2 kg)   BMI 27.26 kg/m    Subjective:    Patient ID: Ruben Conner, male    DOB: [birth year], 84 y.o.   MRN: [id number]  HPI: Ruben Conner is a 84 y.o. male presenting on 05/11/2018 for Feels like he is "in a fog" since changes in medication (nephrologist recently took him off amlodipine, benazepril, Hctz, potassium, torsemide and Travaprost)   HPI Weakness and lightheadedness Patient comes in today describing weakness and lightheadedness and feels like he is in a fog.  He did see his nephrologist just a couple weeks ago who made some changes to some of his medications.  Some of the changes he made were to add torsemide to help with his significant swelling in lower extremities and took him off amlodipine benazepril and hydrochlorothiazide.  He also added potassium to help with the losses due to the torsemide.  Patient says since this time his blood pressure has been running in the 100s over 60s and 90s over 50s.  He was initially started on the torsemide because of significant swelling and weight gain in his lower extremities which has improved.  He just feels like he has a significantly amount of decreased energy where he cannot do the normal things that he usually does.  Relevant past medical, surgical, family and social history reviewed and updated as indicated. Interim medical history since our last visit reviewed. Allergies and medications reviewed and updated.  Review of Systems  Constitutional: Positive for fatigue. Negative for chills and fever.  Respiratory: Negative for shortness of breath and wheezing.   Cardiovascular: Positive for leg swelling. Negative for chest pain.  Musculoskeletal: Negative for back pain and gait problem.  Skin: Negative for rash.  Neurological: Positive for dizziness, weakness and light-headedness.  All other systems reviewed and are negative.   Per HPI  unless specifically indicated above   Allergies as of 05/11/2018   No Known Allergies     Medication List        Accurate as of 05/11/18  1:36 PM. Always use your most recent med list.          amLODipine 5 MG tablet Commonly known as:  NORVASC TAKE (1) TABLET TWICE A DAY.   benazepril 20 MG tablet Commonly known as:  LOTENSIN TAKE (1) TABLET TWICE A DAY.   glimepiride 4 MG tablet Commonly known as:  AMARYL TAKE 1 TABLET DAILY   glucose blood test strip Commonly known as:  ONETOUCH VERIO Twice Daily   hydrochlorothiazide 25 MG tablet Commonly known as:  HYDRODIURIL Take 25 mg by mouth every morning.   niacin 500 MG CR tablet Commonly known as:  NIASPAN Take 500-1,000 mg by mouth 2 (two) times daily. 500 in AM and 1000 in evening   pioglitazone 30 MG tablet Commonly known as:  ACTOS TAKE 1 TABLET DAILY   potassium chloride SA 20 MEQ tablet Commonly known as:  K-DUR,KLOR-CON TAKE 1 TABLET DAILY   predniSONE 10 MG tablet Commonly known as:  DELTASONE TAKE (1) TABLET DAILY WITH BREAKFAST.   torsemide 20 MG tablet Commonly known as:  DEMADEX Take 20 mg by mouth daily. Take 2 pills in the morning for total of 40mg    travoprost (benzalkonium) 0.004 % ophthalmic solution Commonly known as:  TRAVATAN Place 1 drop into both eyes at bedtime.   Vitamin  D3 5000 units Caps Take by mouth.          Objective:    BP 138/73   Pulse 88   Temp 97.7 F (36.5 C) (Oral)   Ht 6' (1.829 m)   Wt 201 lb (91.2 kg)   BMI 27.26 kg/m   Wt Readings from Last 3 Encounters:  05/11/18 201 lb (91.2 kg)  04/06/18 209 lb 12.8 oz (95.2 kg)  02/23/18 202 lb 9.6 oz (91.9 kg)    Physical Exam  Constitutional: He is oriented to person, place, and time. He appears well-developed and well-nourished. No distress.  Eyes: Conjunctivae are normal. No scleral icterus.  Neck: Neck supple. No thyromegaly present.  Cardiovascular: Normal rate, regular rhythm, normal heart sounds and  intact distal pulses.  No murmur heard. Pulmonary/Chest: Effort normal and breath sounds normal. No respiratory distress. He has no wheezes. He has no rales.  Musculoskeletal: Normal range of motion. He exhibits edema (Trace).  Lymphadenopathy:    He has no cervical adenopathy.  Neurological: He is alert and oriented to person, place, and time. Coordination normal.  Skin: Skin is warm and dry. No rash noted. He is not diaphoretic.  Psychiatric: He has a normal mood and affect. His behavior is normal.  Nursing note and vitals reviewed.  EKG: Left bundle branch block, no significant changes from his previous EKG which was performed on 02/22/2016 patient has P waves so even though the EKG shows possible A. fib but does not look like he is actually in A. fib    Assessment & Plan:   Problem List Items Addressed This Visit    None    Visit Diagnoses    Lightheadedness    -  Primary   Relevant Orders   EKG 12-Lead (Completed)   ECHOCARDIOGRAM COMPLETE (Completed)       Follow up plan: Return if symptoms worsen or fail to improve.  Counseling provided for all of the vaccine components Orders Placed This Encounter  Procedures  . EKG 12-Lead  . ECHOCARDIOGRAM COMPLETE    Caryl Pina, MD [facility] 05/11/2018, 1:36 PM

## 2018-05-12 ENCOUNTER — Ambulatory Visit: Payer: PPO | Admitting: Family Medicine

## 2018-05-12 ENCOUNTER — Ambulatory Visit (HOSPITAL_COMMUNITY)
Admission: RE | Admit: 2018-05-12 | Discharge: 2018-05-12 | Disposition: A | Discharge status: Home / Self Care | Payer: PPO | Source: Ambulatory Visit | Attending: Family Medicine | Admitting: Family Medicine

## 2018-05-12 DIAGNOSIS — I447 Left bundle-branch block, unspecified: Secondary | ICD-10-CM | POA: Diagnosis not present

## 2018-05-12 DIAGNOSIS — D869 Sarcoidosis, unspecified: Secondary | ICD-10-CM | POA: Diagnosis not present

## 2018-05-12 DIAGNOSIS — R42 Dizziness and giddiness: Secondary | ICD-10-CM | POA: Diagnosis not present

## 2018-05-12 DIAGNOSIS — E785 Hyperlipidemia, unspecified: Secondary | ICD-10-CM | POA: Diagnosis not present

## 2018-05-12 DIAGNOSIS — I10 Essential (primary) hypertension: Secondary | ICD-10-CM | POA: Diagnosis not present

## 2018-05-12 DIAGNOSIS — R001 Bradycardia, unspecified: Secondary | ICD-10-CM | POA: Diagnosis not present

## 2018-05-12 DIAGNOSIS — E119 Type 2 diabetes mellitus without complications: Secondary | ICD-10-CM | POA: Diagnosis not present

## 2018-05-12 DIAGNOSIS — I081 Rheumatic disorders of both mitral and tricuspid valves: Secondary | ICD-10-CM | POA: Diagnosis not present

## 2018-05-12 DIAGNOSIS — I440 Atrioventricular block, first degree: Secondary | ICD-10-CM | POA: Insufficient documentation

## 2018-05-12 NOTE — Progress Notes (Signed)
*  PRELIMINARY RESULTS* Echocardiogram 2D Echocardiogram has been performed.  Ruben Conner 05/12/2018, 3:05 PM

## 2018-05-13 ENCOUNTER — Inpatient Hospital Stay: Payer: PPO | Attending: Oncology | Admitting: Oncology

## 2018-05-13 ENCOUNTER — Ambulatory Visit: Payer: PPO | Admitting: Physical Therapy

## 2018-05-13 ENCOUNTER — Encounter: Payer: Self-pay | Admitting: Physical Therapy

## 2018-05-13 VITALS — BP 132/72 | HR 100 | Temp 98.6°F | Resp 18 | Ht 72.0 in | Wt 201.2 lb

## 2018-05-13 DIAGNOSIS — J8410 Pulmonary fibrosis, unspecified: Secondary | ICD-10-CM | POA: Diagnosis not present

## 2018-05-13 DIAGNOSIS — Z87891 Personal history of nicotine dependence: Secondary | ICD-10-CM | POA: Insufficient documentation

## 2018-05-13 DIAGNOSIS — M25512 Pain in left shoulder: Secondary | ICD-10-CM

## 2018-05-13 DIAGNOSIS — R590 Localized enlarged lymph nodes: Secondary | ICD-10-CM

## 2018-05-13 DIAGNOSIS — M6281 Muscle weakness (generalized): Secondary | ICD-10-CM

## 2018-05-13 NOTE — Therapy (Addendum)
[name], [place], [address] Phone: [phone]   Fax:  [phone]  Physical Therapy Treatment/Discharge  Patient Details  Name: Ruben Conner MRN: [id number] Date of Birth: 12-17-1929 Referring Provider: Redge Gainer, MD   Encounter Date: 05/13/2018  PT End of Session - 05/13/18 0950    Visit Number  16    Number of Visits  18    Date for PT Re-Evaluation  06/13/18    Authorization Type  Progress note every 10th visit; KX modifier after 15th visit    PT Start Time  0948    PT Stop Time  1029    PT Time Calculation (min)  41 min    Activity Tolerance  Patient tolerated treatment well    Behavior During Therapy  [facility] for tasks assessed/performed       Past Medical History:  Diagnosis Date  . Cancer ([place])    lung  . Diabetes mellitus without complication ([place])   . Hyperlipidemia   . Hypertension   . Irregular heart rate    Dr. Victorino December note from 04/2017 states bradycardia, LBBB  . Pneumonia   . Renal insufficiency     Past Surgical History:  Procedure Laterality Date  . cyst removed    . ENDOBRONCHIAL ULTRASOUND Bilateral 03/23/2017   Procedure: ENDOBRONCHIAL ULTRASOUND;  Surgeon: Rigoberto Noel, MD;  Location: [facility];  Service: Cardiopulmonary;  Laterality: Bilateral;  . EYE SURGERY    . MEDIASTINOSCOPY N/A 04/08/2017   Procedure: MEDIASTINOSCOPY;  Surgeon: Grace Isaac, MD;  Location: [facility];  Service: Thoracic;  Laterality: N/A;  . MOUTH SURGERY    . ROTATOR CUFF REPAIR    . tumor remoced from neck    . VIDEO BRONCHOSCOPY WITH ENDOBRONCHIAL ULTRASOUND N/A 04/08/2017   Procedure: VIDEO BRONCHOSCOPY WITH ENDOBRONCHIAL ULTRASOUND;  Surgeon: Grace Isaac, MD;  Location: [facility];  Service: Thoracic;  Laterality: N/A;  . VIDEO BRONCHOSCOPY WITH ENDOBRONCHIAL ULTRASOUND N/A 12/08/2017   Procedure: VIDEO BRONCHOSCOPY with BRONCHIAL BIOPSIES AND ENDOBRONCHIAL ULTRASOUND WITH TRANSBRONCHIAL  AND BRONCHIAL BIOPSIES;  Surgeon: Grace Isaac, MD;  Location: [facility];  Service: Thoracic;  Laterality: N/A;    There were no vitals filed for this visit.  Subjective Assessment - 05/13/18 0947    Subjective  Reports that he had a rough weekend and was diagnosed with CHF and is extremely weak. Wishes for HEP and DC.    Pertinent History  HTN, DM, Bradycardia, h/o sarcodosis    Limitations  Lifting;House hold activities    Diagnostic tests  none    Patient Stated Goals  get rid of pain and move better with more strength    Currently in Pain?  No/denies         [facility] PT Assessment - 05/13/18 0001      Assessment   Medical Diagnosis  Left shoulder pain, unspecified chronicity    Hand Dominance  Right    Prior Therapy  yes, previous Left RTC       ROM / Strength   AROM / PROM / Strength  AROM      AROM   Overall AROM   Deficits    AROM Assessment Site  Shoulder    Right/Left Shoulder  Left    Left Shoulder Flexion  25 Degrees                   [facility] Adult PT Treatment/Exercise - 05/13/18 0001      Shoulder  Exercises: Seated   Row  AROM;Both;Other (comment) 3x10 reps    External Rotation  AROM;Both;Limitations 3x10 reps    External Rotation Limitations       Flexion  AAROM;Left;15 reps      Shoulder Exercises: Pulleys   Flexion  3 minutes      Shoulder Exercises: Therapy Ball   Flexion  Left;25 reps    Other Therapy Ball Exercises  L circles CW and CCW x30 reps each      Modalities   Modalities  Electrical Stimulation;Vasopneumatic      Electrical Stimulation   Electrical Stimulation Location  L shoulder    Electrical Stimulation Action  IFC    Electrical Stimulation Parameters  80-150 hz x15 min    Electrical Stimulation Goals  Pain      Vasopneumatic   Number Minutes Vasopneumatic   15 minutes    Vasopnuematic Location   Shoulder    Vasopneumatic Pressure  Low    Vasopneumatic Temperature   34             PT Education - 05/13/18  1019    Education provided  Yes    Education Details  HEP- AAROM flexion, table slides into flexion, table circles, scapular retraction, seater ER with written instructions.    Person(s) Educated  Patient    Methods  Explanation;Demonstration;Verbal cues;Handout    Comprehension  Verbalized understanding          PT Long Term Goals - 05/13/18 1024      PT LONG TERM GOAL #1   Title  Patient will be independent with HEP    Time  6    Period  Weeks    Status  Achieved      PT LONG TERM GOAL #2   Title  Patient will report less than 3/10 pain in left shoulder with ADLs.     Time  6    Period  Weeks    Status  Achieved      PT LONG TERM GOAL #3   Title  Patient will improve left shoulder flexion AROM to 115 or greater to improve functional activities.    Time  6    Status  Not Met AROM 110 degrees 04/15/18      PT LONG TERM GOAL #4   Title  Patient will improve L shoulder MMT to 4/5 or greater to improve ability to perform functional activities and care for wife.    Time  6    Period  Weeks    Status  Unable to assess      PT LONG TERM GOAL #5   Title  Patient will improve L functional ER to top of head in order to wash and style hair.    Time  6    Period  Weeks    Status  Achieved            Plan - 05/13/18 1037    Clinical Impression Statement  Patient tolerated today's treatment fair as he arrived very fatigued and had a recent diagnosis of CHF per patient report. Patient encouraged to rest as he needed due to fatigue. Exercises for HEP completed today in sitting due to the fatigue along with less exercises. Normal modalities response noted following removal of the modalities. Education for new HEP provided by PTA for patient with education regarding parameters and technique. Patient heavily encouraged to rest with fatigue regardless of parameters due to CHF. L shoulder MMT not assessed today secondary  to fatigue and patient highly limited due to fatigue.    Rehab  Potential  Good    PT Frequency  2x / week    PT Duration  6 weeks    PT Treatment/Interventions  Iontophoresis 77m/ml Dexamethasone;Moist Heat;Traction;Cryotherapy;Electrical Stimulation;Therapeutic activities;Neuromuscular re-education;Patient/family education;Taping;Manual techniques;Passive range of motion    PT Next Visit Plan  D/C summary required.    PT Home Exercise Plan  Provided in HEP section.    Consulted and Agree with Plan of Care  Patient       Patient will benefit from skilled therapeutic intervention in order to improve the following deficits and impairments:  Decreased activity tolerance, Decreased strength, Decreased range of motion, Impaired UE functional use, Postural dysfunction, Pain  Visit Diagnosis: Left shoulder pain, unspecified chronicity  Muscle weakness (generalized)     Problem List Patient Active Problem List   Diagnosis Date Noted  . Sarcoidosis 04/06/2018  . CKD (chronic kidney disease) stage 4, GFR 15-29 ml/min (HCC) 04/06/2018  . Edema 02/23/2018  . Mediastinal lymphadenopathy 03/10/2017  . Anemia in stage 4 chronic kidney disease ([place] 09/18/2016  . Vitamin D deficiency 09/18/2016  . Sinus bradycardia 08/31/2014  . First degree AV block 08/31/2014  . LBBB (left bundle branch block) 08/31/2014  . HTN (hypertension) 09/20/2013  . Diabetes ([place] 05/02/2013  . Hyperlipemia 05/02/2013    PHYSICAL THERAPY DISCHARGE SUMMARY  Visits from Start of Care: 16  Current functional level related to goals / functional outcomes: See above   Remaining deficits: See goals   Education / Equipment: HEP Plan: Patient agrees to discharge.  Patient goals were partially met. Patient is being discharged due to lack of progress.  ?????    KGabriela Eves PT, DPT   KStandley Brooking PDelaware06/27/19 10:50 AM   [facility] [address] [address] [place] [address]Phone: [phone]  Fax:   [phone] Name: JYOREL REDDERMRN: [id number] of Birth: [birth year]

## 2018-05-13 NOTE — Progress Notes (Signed)
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  He is now followed by Dr. [name] for management of the granulomatous inflammation noted on biopsy of a right lung mass.  He was treated with prednisone for probable sarcoidosis.  He reports developing emotional lability, insomnia, and swelling while on prednisone.  The prednisone dose has been tapered.  His cough has improved. A chest CT on 04/16/2018 revealed a stable right paratracheal mass.  He was recently diagnosed with congestive heart failure.  He is scheduled to see cardiology. He complains of feeling lightheaded.  Peripheral edema has improved.  He has a good appetite.  Objective:  Vital signs in last 24 hours:  There were no vitals taken for this visit.    HEENT: Neck without mass Lymphatics: No cervical, supraclavicular, or axillary nodes Resp: Lungs clear bilaterally Cardio: Irregular GI: No hepatosplenomegaly Vascular: Trace lower extremity edema bilaterally    Lab Results:  Lab Results  Component Value Date   WBC 5.3 01/07/2018   HGB 11.4 (L) 01/07/2018   HCT 34.0 (L) 01/07/2018   MCV 86.5 01/07/2018   PLT 206 01/07/2018   NEUTROABS 4.8 07/23/2017    CMP  Lab Results  Component Value Date   NA 135 12/08/2017   K 3.5 12/08/2017   CL 103 12/08/2017   CO2 17 (L) 12/08/2017   GLUCOSE 69 12/08/2017   BUN 54 (H) 12/08/2017   CREATININE 2.70 (H) 12/08/2017   CALCIUM 8.7 (L) 12/08/2017   PROT 6.3 (L) 12/08/2017   ALBUMIN 3.3 (L) 12/08/2017   AST 15 12/08/2017   ALT 11 (L) 12/08/2017   ALKPHOS 76 12/08/2017   BILITOT 0.7 12/08/2017   GFRNONAA 20 (L) 12/08/2017   GFRAA 23 (L) 12/08/2017     Medications: I have reviewed the patient's current medications.   Assessment/Plan: 1. Right hilar mass, mediastinal adenopathy ? CT chest 02/26/2017 with a right hilar/suprahilar mass and enlargement of several lymph  nodes ? PET scan 06/11/2017-hypermetabolic medial right upper lobe mass, hypermetabolic mediastinal lymph nodes, hypermetabolic left parotid nodule ? Bronchoscopy 03/23/2017-normal airways, EBUS biopsy of a right paratracheal node in the right upper lung with bronchial brushings/washings returned nondiagnostic ? Bronchoscopy/EBUSand mediastinoscopy 04/08/2017-multiple cytologies and lymph node biopsies revealed no malignancy  Radiation to the chest 04/22/2017-05/05/2017, 30Gy in 10 fractions, additional 24Gyin 8 fractions completed 06/17/2017  CT chest 08/04/2017-decrease in right hilar mass and mediastinal lymphadenopathy  CT chest 12/03/2017-increased right hilar mass and mediastinal adenopathy. Extensive consolidation throughout the right lung, subpleural consolidation in the medial left upper lobe  Bronchoscopy/EBUS1/20/2019, no endobronchial tumor seen, brushings/biopsies from the right upper lobe and biopsies from multiple lymph nodes revealed "atypical cells ", nondiagnostic of malignancy  PET scan 03/20/980- hypermetabolic mediastinal lymphadenopathy, hypermetabolic lung lesions, hypermetabolic periportal node, postobstructive pneumonitis versus tumor spread in the right lung, hypermetabolic right hilar mass  CT biopsy right upper lung mass 01/07/2018-non-necrotizing granulomatous inflammation, AFB and fungal stains negative 2. Renal insufficiency 3. 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  Multiple biopsies were nondiagnostic of malignancy.  A biopsy on 01/07/2018 revealed granulomatous inflammation.  He is now followed by pulmonary medicine for probable sarcoidosis, though a ACE level was normal. 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 [name] in pulmonary medicine.  I am available to see him in the future as needed.  He will continue follow-up with  nephrology and cardiology.   Betsy Coder, MD  05/13/2018    1:51 PM

## 2018-05-17 ENCOUNTER — Other Ambulatory Visit: Payer: PPO

## 2018-05-17 DIAGNOSIS — N183 Chronic kidney disease, stage 3 (moderate): Secondary | ICD-10-CM | POA: Diagnosis not present

## 2018-05-18 ENCOUNTER — Ambulatory Visit (INDEPENDENT_AMBULATORY_CARE_PROVIDER_SITE_OTHER): Payer: PPO | Admitting: Family Medicine

## 2018-05-18 ENCOUNTER — Telehealth: Payer: Self-pay | Admitting: Cardiovascular Disease

## 2018-05-18 ENCOUNTER — Encounter: Payer: Self-pay | Admitting: Family Medicine

## 2018-05-18 VITALS — BP 119/78 | HR 93 | Temp 97.3°F | Ht 72.0 in | Wt 206.0 lb

## 2018-05-18 DIAGNOSIS — E1122 Type 2 diabetes mellitus with diabetic chronic kidney disease: Secondary | ICD-10-CM

## 2018-05-18 DIAGNOSIS — R931 Abnormal findings on diagnostic imaging of heart and coronary circulation: Secondary | ICD-10-CM | POA: Diagnosis not present

## 2018-05-18 DIAGNOSIS — N183 Chronic kidney disease, stage 3 (moderate): Secondary | ICD-10-CM | POA: Diagnosis not present

## 2018-05-18 DIAGNOSIS — R531 Weakness: Secondary | ICD-10-CM | POA: Diagnosis not present

## 2018-05-18 DIAGNOSIS — D631 Anemia in chronic kidney disease: Secondary | ICD-10-CM | POA: Diagnosis not present

## 2018-05-18 DIAGNOSIS — N184 Chronic kidney disease, stage 4 (severe): Secondary | ICD-10-CM | POA: Diagnosis not present

## 2018-05-18 DIAGNOSIS — I499 Cardiac arrhythmia, unspecified: Secondary | ICD-10-CM

## 2018-05-18 DIAGNOSIS — R42 Dizziness and giddiness: Secondary | ICD-10-CM | POA: Diagnosis not present

## 2018-05-18 DIAGNOSIS — R609 Edema, unspecified: Secondary | ICD-10-CM

## 2018-05-18 MED ORDER — APIXABAN 2.5 MG PO TABS: 2.5000 mg | ORAL_TABLET | Freq: Two times a day (BID) | ORAL | 2 refills | Status: DC

## 2018-05-18 NOTE — Progress Notes (Signed)
Subjective:    Patient ID: Ruben Conner, male    DOB: 1930-03-25, 84 y.o.   MRN: [id number]  HPI Patient here today for follow up on CHF. He complains of edema, weakness and light-headedness.  The patient has several issues going on.  He is complaining of lightheadedness weakness and edema.  He saw the nephrologist recently and several medicines were discontinued including amlodipine HCTZ and benazepril.  He was subsequently placed on torsemide.  He was also discontinued Actos and Niaspan.  He has an upcoming appointment with a cardiologist on July 8 and a future visit scheduled with the pulmonologist.  He also had an echocardiogram which had an ejection fraction of 35 to 40%.  We will make sure that we get a BMP and CBC today for this patient.  His creatinine has been running between 2.37 and 2.70.  His hemoglobin has been in the 11 range.  The patient stopped his Demadex several days ago.  He is still having some edema.  It was much better when he took the [facility] but he was so weak and had somewhat shortness of breath that he had to stop it.  He also has stopped his Actos.  He is no longer on amlodipine benazepril or HCTZ.  He did have the ejection fraction checked and this was 35 to 40%.  The patient today denies any chest pain.  He does have weakness and shortness of breath and feels like his heart is just pounding in side at certain times during the day.  And relating this history to me he is very tearful.  I know his primary goal was to be there for his wife.  He is passing his water without problems and not having any GI complaints.   Patient Active Problem List   Diagnosis Date Noted  . Sarcoidosis 04/06/2018  . CKD (chronic kidney disease) stage 4, GFR 15-29 ml/min (HCC) 04/06/2018  . Edema 02/23/2018  . Mediastinal lymphadenopathy 03/10/2017  . Anemia in stage 4 chronic kidney disease ([place]) 09/18/2016  . Vitamin D deficiency 09/18/2016  . Sinus bradycardia 08/31/2014  . First degree  AV block 08/31/2014  . LBBB (left bundle branch block) 08/31/2014  . HTN (hypertension) 09/20/2013  . Diabetes ([place]) 05/02/2013  . Hyperlipemia 05/02/2013   Outpatient Encounter Medications as of 05/18/2018  Medication Sig  . Cholecalciferol (VITAMIN D3) 5000 units CAPS Take by mouth.  Marland Kitchen glimepiride (AMARYL) 4 MG tablet TAKE 1 TABLET DAILY  . glucose blood (ONETOUCH VERIO) test strip Twice Daily  . predniSONE (DELTASONE) 10 MG tablet TAKE (1) TABLET DAILY WITH BREAKFAST.  . [DISCONTINUED] amLODipine (NORVASC) 5 MG tablet TAKE (1) TABLET TWICE A DAY. (Patient not taking: Reported on 05/11/2018)  . [DISCONTINUED] benazepril (LOTENSIN) 20 MG tablet TAKE (1) TABLET TWICE A DAY. (Patient not taking: Reported on 05/11/2018)  . [DISCONTINUED] hydrochlorothiazide (HYDRODIURIL) 25 MG tablet Take 25 mg by mouth every morning.   . [DISCONTINUED] niacin (NIASPAN) 500 MG CR tablet Take 500-1,000 mg by mouth 2 (two) times daily. 500 in AM and 1000 in evening  . [DISCONTINUED] pioglitazone (ACTOS) 30 MG tablet TAKE 1 TABLET DAILY  . [DISCONTINUED] potassium chloride SA (K-DUR,KLOR-CON) 20 MEQ tablet TAKE 1 TABLET DAILY (Patient not taking: Reported on 05/11/2018)  . [DISCONTINUED] torsemide (DEMADEX) 20 MG tablet Take 20 mg by mouth daily. Take 2 pills in the morning for total of 45m  . [DISCONTINUED] travoprost, benzalkonium, (TRAVATAN) 0.004 % ophthalmic solution Place 1 drop into both eyes  at bedtime.   No facility-administered encounter medications on file as of 05/18/2018.       Review of Systems  Constitutional: Negative.   HENT: Negative.   Eyes: Negative.   Respiratory: Negative.   Cardiovascular: Positive for leg swelling.       Elevated HR   Gastrointestinal: Negative.   Endocrine: Negative.   Genitourinary: Negative.   Musculoskeletal: Negative.   Skin: Negative.   Allergic/Immunologic: Negative.   Neurological: Positive for weakness and light-headedness.  Hematological: Negative.     Psychiatric/Behavioral: The patient is nervous/anxious.        Objective:   Physical Exam  Constitutional: He is oriented to person, place, and time. He appears well-developed and well-nourished. He appears distressed.  HENT:  Head: Normocephalic and atraumatic.  Right Ear: External ear normal.  Left Ear: External ear normal.  Nose: Nose normal.  Mouth/Throat: Oropharynx is clear and moist. No oropharyngeal exudate.  Eyes: Pupils are equal, round, and reactive to light. Conjunctivae and EOM are normal. Right eye exhibits no discharge. Left eye exhibits no discharge. No scleral icterus.  Neck: Normal range of motion. Neck supple. No thyromegaly present.  No bruits or thyromegaly  Cardiovascular: Normal rate and normal heart sounds.  No murmur heard. The heart is irregular irregular at 84 to 96 bpm  Pulmonary/Chest: Effort normal and breath sounds normal. He has no wheezes. He has no rales.  Abdominal: Soft. Bowel sounds are normal. He exhibits no mass. There is no tenderness.  Musculoskeletal: Normal range of motion. He exhibits edema. He exhibits no tenderness.  There is 1+ pedal edema bilaterally.  Lymphadenopathy:    He has no cervical adenopathy.  Neurological: He is alert and oriented to person, place, and time. He has normal reflexes. No cranial nerve deficit.  Skin: Skin is warm and dry. No rash noted.  Psychiatric: His behavior is normal. Judgment and thought content normal.  Patient's mood and affect is somewhat emotional and depressed today worrying about his condition because of his inability to take care of his wife as well.  He does have someone that comes and helps them and sits with them and I have encouraged him to have that person come in daily until we get his medical situation worked out so that he feels more confident about his current medical circumstances.  Nursing note and vitals reviewed.   BP 119/78 (BP Location: Left Arm)   Pulse 93   Temp (!) 97.3 F  (36.3 C) (Oral)   Ht 6' (1.829 m)   Wt 206 lb (93.4 kg)   SpO2 97%   BMI 27.94 kg/m   EKG with results pending====     Assessment & Plan:  1. Lightheadedness -EKG - BMP8+EGFR - CBC with Differential/Platelet - EKG 12-Lead - Thyroid Panel With TSH  2. Weakness - BMP8+EGFR - CBC with Differential/Platelet - EKG 12-Lead - Thyroid Panel With TSH  3. Edema, unspecified type - BMP8+EGFR - CBC with Differential/Platelet - EKG 12-Lead  4. Decreased cardiac ejection fraction -Appointment with cardiology in 1 week  5. Anemia in stage 4 chronic kidney disease ([place]) -Continue to follow-up with nephrology  6. Type 2 diabetes mellitus with stage 3 chronic kidney disease, without long-term current use of insulin (HCC) Blood sugar this morning was 123 at home but in the evening it seems to run higher around 250.  7.  Irregular heartbeat -EKG--- the EKG showed frequent multiform ventricular ectopy and possibly atrial fib. -We will discuss this with his cardiologist  before he leaves the office.  Patient Instructions  Please try to get someone to come in and stay with you and your wife for the next few days until we can sort out the path were going to take with the symptoms you have had recently. You do have the appointment with the cardiologist on July 8 and we need to keep that We will call you with the results of the BMP CBC and thyroid as soon as those results become available and make sure that the heart doctor gets a copy of that along with the nephrologist. My suggestion is that you get back on the [facility] but only take 120 mg p.o.  I would take this for 2 days in a row and then reduce it to just Monday Wednesday and Friday until you see the cardiologist. With your ejection fraction being as low as it is it may mean that you will need a pacemaker.  Arrie Senate MD

## 2018-05-18 NOTE — Addendum Note (Signed)
Addended by: Zannie Cove on: 05/18/2018 10:43 AM   Modules accepted: Orders

## 2018-05-18 NOTE — Patient Instructions (Signed)
Please try to get someone to come in and stay with you and your wife for the next few days until we can sort out the path were going to take with the symptoms you have had recently. You do have the appointment with the cardiologist on July 8 and we need to keep that We will call you with the results of the BMP CBC and thyroid as soon as those results become available and make sure that the heart doctor gets a copy of that along with the nephrologist. My suggestion is that you get back on the [facility] but only take 120 mg p.o.  I would take this for 2 days in a row and then reduce it to just Monday Wednesday and Friday until you see the cardiologist. With your ejection fraction being as low as it is it may mean that you will need a pacemaker.

## 2018-05-19 ENCOUNTER — Other Ambulatory Visit: Payer: Self-pay | Admitting: *Deleted

## 2018-05-19 DIAGNOSIS — E876 Hypokalemia: Secondary | ICD-10-CM

## 2018-05-19 DIAGNOSIS — R710 Precipitous drop in hematocrit: Secondary | ICD-10-CM

## 2018-05-19 LAB — BMP8+EGFR
BUN/Creatinine Ratio: 18 (ref 10–24)
BUN: 37 mg/dL — ABNORMAL HIGH (ref 8–27)
CO2: 26 mmol/L (ref 20–29)
Calcium: 8.8 mg/dL (ref 8.6–10.2)
Chloride: 97 mmol/L (ref 96–106)
Creatinine, Ser: 2.1 mg/dL — ABNORMAL HIGH (ref 0.76–1.27)
GFR calc non Af Amer: 28 mL/min/{1.73_m2} — ABNORMAL LOW (ref >59)
GFR, EST AFRICAN AMERICAN: 32 mL/min/{1.73_m2} — AB (ref >59)
Glucose: 185 mg/dL — ABNORMAL HIGH (ref 65–99)
POTASSIUM: 3.1 mmol/L — AB (ref 3.5–5.2)
SODIUM: 140 mmol/L (ref 134–144)

## 2018-05-19 LAB — CBC WITH DIFFERENTIAL/PLATELET
BASOS: 0 %
Basophils Absolute: 0 10*3/uL (ref 0.0–0.2)
EOS (ABSOLUTE): 0.1 10*3/uL (ref 0.0–0.4)
Eos: 1 %
Hematocrit: 29.7 % — ABNORMAL LOW (ref 37.5–51.0)
Hemoglobin: 10.2 g/dL — ABNORMAL LOW (ref 13.0–17.7)
Immature Grans (Abs): 0.1 10*3/uL (ref 0.0–0.1)
Immature Granulocytes: 1 %
Lymphocytes Absolute: 0.4 10*3/uL — ABNORMAL LOW (ref 0.7–3.1)
Lymphs: 4 %
MCH: 29.3 pg (ref 26.6–33.0)
MCHC: 34.3 g/dL (ref 31.5–35.7)
MCV: 85 fL (ref 79–97)
MONOS ABS: 0.5 10*3/uL (ref 0.1–0.9)
Monocytes: 5 %
NEUTROS ABS: 8.8 10*3/uL — AB (ref 1.4–7.0)
Neutrophils: 89 %
PLATELETS: 261 10*3/uL (ref 150–450)
RBC: 3.48 x10E6/uL — ABNORMAL LOW (ref 4.14–5.80)
RDW: 13.9 % (ref 12.3–15.4)
WBC: 9.8 10*3/uL (ref 3.4–10.8)

## 2018-05-19 LAB — THYROID PANEL WITH TSH
Free Thyroxine Index: 2.8 (ref 1.2–4.9)
T3 Uptake Ratio: 34 % (ref 24–39)
T4, Total: 8.1 ug/dL (ref 4.5–12.0)
TSH: 0.823 u[IU]/mL (ref 0.450–4.500)

## 2018-05-21 ENCOUNTER — Other Ambulatory Visit: Payer: PPO

## 2018-05-21 DIAGNOSIS — N183 Chronic kidney disease, stage 3 (moderate): Secondary | ICD-10-CM | POA: Diagnosis not present

## 2018-05-24 ENCOUNTER — Ambulatory Visit (INDEPENDENT_AMBULATORY_CARE_PROVIDER_SITE_OTHER): Payer: PPO | Admitting: Cardiovascular Disease

## 2018-05-24 ENCOUNTER — Ambulatory Visit (INDEPENDENT_AMBULATORY_CARE_PROVIDER_SITE_OTHER): Payer: PPO | Admitting: Pulmonary Disease

## 2018-05-24 ENCOUNTER — Encounter: Payer: Self-pay | Admitting: Pulmonary Disease

## 2018-05-24 ENCOUNTER — Encounter: Payer: Self-pay | Admitting: Cardiovascular Disease

## 2018-05-24 ENCOUNTER — Other Ambulatory Visit: Payer: PPO

## 2018-05-24 VITALS — BP 122/54 | HR 92 | Ht 72.0 in | Wt 208.6 lb

## 2018-05-24 DIAGNOSIS — I429 Cardiomyopathy, unspecified: Secondary | ICD-10-CM | POA: Diagnosis not present

## 2018-05-24 DIAGNOSIS — I447 Left bundle-branch block, unspecified: Secondary | ICD-10-CM

## 2018-05-24 DIAGNOSIS — I5021 Acute systolic (congestive) heart failure: Secondary | ICD-10-CM | POA: Diagnosis not present

## 2018-05-24 DIAGNOSIS — N184 Chronic kidney disease, stage 4 (severe): Secondary | ICD-10-CM

## 2018-05-24 DIAGNOSIS — R710 Precipitous drop in hematocrit: Secondary | ICD-10-CM

## 2018-05-24 DIAGNOSIS — I481 Persistent atrial fibrillation: Secondary | ICD-10-CM

## 2018-05-24 DIAGNOSIS — I10 Essential (primary) hypertension: Secondary | ICD-10-CM

## 2018-05-24 DIAGNOSIS — E7800 Pure hypercholesterolemia, unspecified: Secondary | ICD-10-CM | POA: Diagnosis not present

## 2018-05-24 DIAGNOSIS — E876 Hypokalemia: Secondary | ICD-10-CM | POA: Diagnosis not present

## 2018-05-24 DIAGNOSIS — D869 Sarcoidosis, unspecified: Secondary | ICD-10-CM | POA: Diagnosis not present

## 2018-05-24 DIAGNOSIS — I4819 Other persistent atrial fibrillation: Secondary | ICD-10-CM

## 2018-05-24 MED ORDER — TORSEMIDE 20 MG PO TABS: ORAL_TABLET | ORAL | 3 refills | Status: DC

## 2018-05-24 NOTE — Patient Instructions (Signed)
Dr Sallyanne Kuster has recommended making the following medication changes: 1. TAKE Torsemide 20 mg daily  Weigh daily  HOLD Torsemide if your weight is less than 200 pounds 2. INCREASE Potassium to 40 mEq daily  Your physician recommends that you schedule a follow-up appointment in 2 weeks with Roderic Palau, NP in the [facility].  Dr Sallyanne Kuster recommends that you schedule a follow-up appointment first available.      [facility] [address] [place] [address] Dept: [phone] Loc: [phone]  Ruben Conner  05/24/2018  You are scheduled for a TEE Cardioversion on Friday, July 12th, 2019 with Dr Sallyanne Kuster. Please arrive at the [facility] (Main Entrance A) at [facility]: [address] [place], [place] [address] at 9:30 am.  DIET: Nothing to eat or drink after midnight except a sip of water with medications (see medication instructions below)  Medication Instructions: Hold Glimepiride  Continue your anticoagulant: Eliquis You will need to continue your anticoagulant after your procedure until you are told by your provider that it is safe to stop   Labs: Your lab work will be done at the hospital prior to your procedure - you will need to arrive 1  hours ahead of your procedure  You must have a responsible person to drive you home and stay in the waiting area during your procedure. Failure to do so could result in cancellation.  Bring your insurance cards.  *Special Note: Every effort is made to have your procedure done on time. Occasionally there are emergencies that occur at the hospital that may cause delays. Please be patient if a delay does occur.

## 2018-05-24 NOTE — Assessment & Plan Note (Addendum)
Decrease to 5 mg of prednisone daily after breakfast. Okay to take Delsym 5 by mouth twice daily as needed for cough.  Based on last imaging, appears to be stable.  Changes in the right upper lobe are related to edition pneumonitis

## 2018-05-24 NOTE — H&P (View-Only) (Signed)
Patient ID: Ruben Conner, male   DOB: 02/18/1930, 84 y.o.   MRN: [id number]    Cardiology Office Note    Date:  05/24/2018   ID:  Ruben, Conner 1930-10-21, MRN [id number]  PCP:  Chipper Herb, MD  Cardiologist:   Sanda Klein, MD   No chief complaint on file.   History of Present Illness:  Ruben Conner "[name]" is a 84 y.o. male with long-standing asymptomatic bradycardia, left bundle branch block and prolonged AV conduction time, pulmonary and mediastinal sarcoidosis, who recently developed persistent atrial fibrillation and acute heart failure. He has systemic hypertension and controlled type 2 diabetes mellitus on oral anti-diabetics. He did not have any known structural heart disease prior to the last few months.  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 The abnormality was first seen on a chest x-ray performed in February 2018, following a protracted case of upper respiratory infection and bronchitis. It was hypermetabolic on PET scan, but attempts to obtain a tissue diagnosis with both bronchoscopy and mediastinoscopy were unsuccessful.  After radiation therapy he developed worsening bilateral hilar lymphadenopathy and right upper lobe radiation pneumonitis.  Repeat attempts at biopsying the abnormality in February 2019 have led to a diagnosis of sarcoidosis, based on the presence of nonnecrotizing granulomas.  Pulmonary function tests did not show evidence of significant obstruction or restriction, but diffusion capacity was decreased to 59%.  He was started on steroid therapy.  Because a lot of problems with emotional instability, insomnia and lower extremity edema and dyspnea.  He developed a lot of problems with bilateral pedal edema as well as increased cough and wheezing.  He was started on oral diuretics by Dr. Joelyn Oms, who follows him for CKD stage IV.  Actos  was discontinued.  On increased doses of torsemide, the edema resolved, but that he developed extreme weakness and had to stop it.  His ACE inhibitor and hydrochlorothiazide have been stopped.  Main has been in the 2-2 0.7 range.  June 26 he underwent an echocardiogram because of the swelling.  This showed moderate left ventricular hypertrophy and a reduction in LVEF to 35-40%.  Retrospectively, looking over that study he was in atrial fibrillation at the time, although this was not explicitly reported.  July 2 he followed up with Dr. Laurance Flatten and ECG performed at that time showed clear evidence of atrial fibrillation with controlled ventricular response.  He is not receiving any AV blocking agents.  Apixaban was started at the time.  On a lower dose of steroids and feels that his emotional symptoms are improving, although he has severe pedal edema.  He has had to buy new shoes to sizes bigger and even these are getting tight.  He does not have frank orthopnea or PND.  He denies any chest discomfort.  He is only occasionally aware of palpitations.  He has not had syncope, but does describe lightheadedness.  He has not had seizures.  His weight has fluctuated a lot recently, but it is fairly clear that his usual weight is under 200 pounds.  He weighs almost 9 pounds more than that today.  He remains concerned by the progressive dementia of his wife, Ruben Conner, who is also my patient.  Many of the decisions he takes for his own health care are impacted by the fact that he wants to survive to take care of her.  Past Medical History:  Diagnosis Date  . Cancer ([place])    lung  . Diabetes mellitus without complication ([place])   . Hyperlipidemia   . Hypertension   . Irregular heart rate    Dr. Victorino December note from 04/2017 states bradycardia, LBBB  . Pneumonia   . Renal insufficiency     Past Surgical History:  Procedure Laterality Date  . cyst removed    . ENDOBRONCHIAL ULTRASOUND Bilateral 03/23/2017    Procedure: ENDOBRONCHIAL ULTRASOUND;  Surgeon: Rigoberto Noel, MD;  Location: [facility];  Service: Cardiopulmonary;  Laterality: Bilateral;  . EYE SURGERY    . MEDIASTINOSCOPY N/A 04/08/2017   Procedure: MEDIASTINOSCOPY;  Surgeon: Grace Isaac, MD;  Location: [facility];  Service: Thoracic;  Laterality: N/A;  . MOUTH SURGERY    . ROTATOR CUFF REPAIR    . tumor remoced from neck    . VIDEO BRONCHOSCOPY WITH ENDOBRONCHIAL ULTRASOUND N/A 04/08/2017   Procedure: VIDEO BRONCHOSCOPY WITH ENDOBRONCHIAL ULTRASOUND;  Surgeon: Grace Isaac, MD;  Location: [facility];  Service: Thoracic;  Laterality: N/A;  . VIDEO BRONCHOSCOPY WITH ENDOBRONCHIAL ULTRASOUND N/A 12/08/2017   Procedure: VIDEO BRONCHOSCOPY with BRONCHIAL BIOPSIES AND ENDOBRONCHIAL ULTRASOUND WITH TRANSBRONCHIAL AND BRONCHIAL BIOPSIES;  Surgeon: Grace Isaac, MD;  [facility];  Service: Thoracic;  Laterality: N/A;    Current Medications: Outpatient Medications Prior to Visit  Medication Sig Dispense Refill  . apixaban (ELIQUIS) 2.5 MG TABS tablet Take 1 tablet (2.5 mg total) by mouth 2 (two) times daily. 60 tablet 2  . Cholecalciferol (VITAMIN D3) 5000 units CAPS Take by mouth.    Marland Kitchen glimepiride (AMARYL) 4 MG tablet TAKE 1 TABLET DAILY 30 tablet 3  . glucose blood (ONETOUCH VERIO) test strip Twice Daily 100 each 12  . potassium chloride SA (K-DUR,KLOR-CON) 20 MEQ tablet Take 40 mEq by mouth daily.    . predniSONE (DELTASONE) 10 MG tablet TAKE (1) TABLET DAILY WITH BREAKFAST. 90 tablet 0   No facility-administered medications prior to visit.      Allergies:   Patient has no known allergies.   Social History   Socioeconomic History  . Marital status: Married    Spouse name: Not on file  . Number of children: 1  . Years of education: Not on file  . Highest education level: Not on file  Occupational History  . Occupation: Retired Advice worker  . Financial resource strain: Not on file  . Food insecurity:     Worry: Not on file    Inability: Not on file  . Transportation needs:    Medical: Not on file    Non-medical: Not on file  Tobacco Use  . Smoking status: Former Smoker    Packs/day: 0.10    Years: 53.00    Pack years: 5.30    Types: Cigarettes    Last attempt to quit: 11/28/2016    Years since quitting: 1.4  . Smokeless tobacco: Never Used  Substance and Sexual Activity  . Alcohol use: No    Alcohol/week: 0.0 oz  . Drug use: No  . Sexual activity: Never  Lifestyle  . Physical activity:    Days per week: Not on file    Minutes per session: Not on file  . Stress: Not on file  Relationships  . Social connections:    Talks on phone: Not on file    Gets together: Not on file    Attends religious service: Not on file    Active member  of club or organization: Not on file    Attends meetings of clubs or organizations: Not on file    Relationship status: Not on file  Other Topics Concern  . Not on file  Social History Narrative  . Not on file     Family History:  The patient's family history includes Cancer in his son; Heart disease in his father; Kidney disease in his mother; Suicidality in his son.   ROS:   Please see the history of present illness.    ROS All other systems reviewed and are negative.   PHYSICAL EXAM:   VS:  BP (!) 122/54   Pulse 92   Ht 6' (1.829 m)   Wt 208 lb 9.6 oz (94.6 kg)   BMI 28.29 kg/m     General: Alert, oriented x3, no distress Head: no evidence of trauma, PERRL, EOMI, no exophtalmos or lid lag, no myxedema, no xanthelasma; normal ears, nose and oropharynx Neck: 8-9 cm elevation in jugular venous pulsations and marked hepatojugular reflux; brisk carotid pulses without delay and no carotid bruits Chest: clear to auscultation, no signs of consolidation by percussion or palpation, normal fremitus, symmetrical and full respiratory excursions Cardiovascular: normal position and quality of the apical impulse, irregular rhythm, normal first and  split second heart sounds, no murmurs, rubs or gallops Abdomen: Appears mildly distended, no masses by palpation, no abnormal pulsatility or arterial bruits, normal bowel sounds, probable hepatomegaly Extremities: no clubbing, cyanosis; symmetrical 3+ pitting ankle and pedal edema; 2+ radial, ulnar and brachial pulses bilaterally; unable to palpate pedal pulses due to edema Neurological: grossly nonfocal Psych: Normal mood and affect, a little more emotional than his usual calm and cool self   Wt Readings from Last 3 Encounters:  05/24/18 208 lb 9.6 oz (94.6 kg)  05/24/18 207 lb 9.6 oz (94.2 kg)  05/18/18 206 lb (93.4 kg)    Studies/Labs Reviewed:   EKG:  EKG is ordered today.  It shows atrial fibrillation with occasional PVCs, nonspecific intraventricular conduction delay 122 ms, QTC 486 ms  Recent Labs: 12/08/2017: ALT 11 05/18/2018: BUN 37; Creatinine, Ser 2.10; Hemoglobin 10.2; Platelets 261; Potassium 3.1; Sodium 140; TSH 0.823   Lipid Panel    Component Value Date/Time   CHOL 137 07/23/2017 0925   TRIG 93 07/23/2017 0925   TRIG 108 07/06/2014 0936   HDL 36 (L) 07/23/2017 0925   HDL 35 (L) 07/06/2014 0936   CHOLHDL 3.8 07/23/2017 0925   LDLCALC 82 07/23/2017 0925   LDLCALC 83 07/06/2014 0936     ASSESSMENT:    1. Persistent atrial fibrillation ([place])   2. Acute systolic heart failure (HCC)   3. Cardiomyopathy, unspecified type ([place])   4. LBBB (left bundle branch block)   5. Essential hypertension   6. CKD (chronic kidney disease) stage 4, GFR 15-29 ml/min (HCC)   7. Hypercholesterolemia     PLAN:  In order of problems listed above:  1. AFib: Anticoagulation was started last week (dose adjusted for age and renal function). CHADSVasc 5 (age 22, HTN, DM, HF). He is spontaneously rate controlled without medications, but the arrhythmia is associated with heart failure decompensation.  It is hard to say which came first.  He would benefit from return to normal rhythm.  The  earliest we have been able to schedule him for a TEE guided cardioversion is Friday, July 12, due to the limited availability of Anesthesiology. This procedure has been fully reviewed with the patient and written informed consent  has been obtained.  2. CHF: He is clearly hypervolemic today.  I have asked him to increase his dose of torsemide to 20 mg every day.  (At this point he was supposed to be on 20 mg on Monday Wednesday and Friday only).  We will continue on daily torsemide as long as his weight is over 200 pounds, skipping it on days when he is under 200 pounds.  Hopefully this we will keep a balance between heart failure and kidney failure.  His most recent potassium was 3.1 so I also asked him to increase his potassium chloride supplement to 40 mEq daily, between now and the procedure on Friday we will recheck electrolytes and renal function parameters.  If he develops worsening shortness of breath he should be admitted to the hospital.  He prefers at this point to stay at home and reports that he is only mildly symptomatic at rest.  Medications to treat congestive heart failure will be very limited.  Beta-blockers cannot be safely administered unless he receives a pacemaker.  RAAS inhibitors are contraindicated with his degree of kidney failure. 3. Cardiomyopathy: The cause of the cardiomyopathy is not immediately clear, but need to keep in mind the possibility that is also related to sarcoidosis.  Tachycardia cardiomyopathy is unlikely since he is spontaneously rate controlled.  Coronary disease is obviously a concern.  He is elderly and has risk factors, but chest pain has never been a complaint and he is not known to have coronary disease.  Aortic and coronary artery vascular calcifications are clearly present on his CT chest, but not unexpected in an 84 year old.  He is not a great candidate for either invasive coronary angiography or coronary CT angiography due to advanced chronic kidney disease.   We will probably schedule him for [facility] once heart failure is compensated and atrial fibrillation has been cardioverted.  Cardiac MRI will be the optimal diagnostic test for possible cardiac sarcoidosis, but he should not receive gadolinium with his renal function. 4. 1st deg AV block and LBBB: These were present before, when he also had marked sinus bradycardia.  He will not be able to tolerate beta-blockers or antiarrhythmic medications without first implanting a pacemaker.  If her pacemaker is needed, he needs a cardiac resynchronization device due to pre-existing IVCD and high likelihood that he will require ventricular pacing.  Conduction abnormalities could be age-related, although sarcoidosis can also cause them. 5. HTN: Well controlled.  Avoid medications with negative chronotropic effect. 6. CKD 4: This was presumed to be secondary to diabetes and hypertension, but Dr. Joelyn Oms reports in his notes that retrospectively he now wonders whether there may have been a connection with a diagnosis of sarcoidosis.  Monitor renal function and potassium level carefully while we adjust medications. 7. HLP: Target LDL under 100 due to diabetes mellitus, under 70 if we discover significant CAD. Very close to target on current medical regimen per labs from September 2018.    Medication Adjustments/Labs and Tests Ordered: Current medicines are reviewed at length with the patient today.  Concerns regarding medicines are outlined above.  Medication changes, Labs and Tests ordered today are listed in the Patient Instructions below. Patient Instructions  Dr Sallyanne Kuster has recommended making the following medication changes: 1. TAKE Torsemide 20 mg daily  Weigh daily  HOLD Torsemide if your weight is less than 200 pounds 2. INCREASE Potassium to 40 mEq daily  Your physician recommends that you schedule a follow-up appointment in 2 weeks with Butch Penny  Kayleen Memos, NP in the [facility].  Dr  Sallyanne Kuster recommends that you schedule a follow-up appointment first available.      [facility] [address] [place] [address] Dept: [phone] Loc: [phone]  AMONTE BROOKOVER  05/24/2018  You are scheduled for a TEE Cardioversion on Friday, July 12th, 2019 with Dr Sallyanne Kuster. Please arrive at the [facility] (Main Entrance A) at [facility]: [address] [place], [place] [address] at 9:30 am.  DIET: Nothing to eat or drink after midnight except a sip of water with medications (see medication instructions below)  Medication Instructions: Hold Glimepiride  Continue your anticoagulant: Eliquis You will need to continue your anticoagulant after your procedure until you are told by your provider that it is safe to stop   Labs: Your lab work will be done at the hospital prior to your procedure - you will need to arrive 1  hours ahead of your procedure  You must have a responsible person to drive you home and stay in the waiting area during your procedure. Failure to do so could result in cancellation.  Bring your insurance cards.  *Special Note: Every effort is made to have your procedure done on time. Occasionally there are emergencies that occur at the hospital that may cause delays. Please be patient if a delay does occur.      Signed, Sanda Klein, MD  05/24/2018 7:27 PM    [address], [place], [place]  [address] Phone: [phone]; Fax: [phone]

## 2018-05-24 NOTE — Patient Instructions (Signed)
Decrease to 5 mg of prednisone daily after breakfast. Okay to take Delsym 5 by mouth twice daily as needed for cough.  Drs. Croitoru & Sanford to clarify dose of torsemide

## 2018-05-24 NOTE — Progress Notes (Signed)
   Subjective:    Patient ID: Ruben Conner, male    DOB: 04/29/1930, 84 y.o.   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 Follow-up imaging showed increased bilateral hilar lymphadenopathy and right upper lobe changes of radiation pneumonitis. He finally underwentCT biopsy ofRUL lung mass 01/07/18 >>non-necrotizing granulomatous inflammation  PMH of hypertension, diabetes and hyperlipidemia and CKD4 Joelyn Oms ) He was initially on 20 mg of prednisone and  02/2018 restarted on 10 mg of prednisone  He developed increased pedal edema, drop 20 to 13 pounds with torsemide but developed dizziness and this was stopped.  Echo showed cardiomyopathy with EF 35% he has an appointment with cardiology for today.  He is back on torsemide every other day but pedal edema has returned. He reports a dry cough and intermittent wheezing Is compliant with 10 mg of prednisone   Significant tests/ events reviewed  CT chest without contrast >> right hilar soft tissue mass 6.64.8 cm which narrowed the superior vena cava and the right upper lobe bronchus. Scattered mediastinal lymph nodes were noted largest precarinal lymph node measuring 13 mm. A right lower lobe subpleural nodule about 5 mm was noted there was also a calcified granuloma in the right upper lobe.  ? PET scan 04/26/202018-hypermetabolic medial right upper lobe mass, hypermetabolic mediastinal lymph nodes, hypermetabolic left parotid nodule ? Bronchoscopy 03/23/2017-normal airways, EBUS biopsy of a right paratracheal node in the right upper lung with bronchial brushings/washings returned nondiagnostic ? Bronchoscopy/EBUSand mediastinoscopy 04/08/2017-multiple cytologies and  lymph node biopsies revealed no malignancy  Bronchoscopy/EBUS1/20/2019, no endobronchial tumor seen, brushings/biopsies from the right upper lobe and biopsies from multiple lymph nodes revealed "atypical cells ", nondiagnostic of malignancy  CT chest 03/2018 >> stable right paratracheal mass, radiation changes right upper lobe, bilateral lower lobe groundglass stable compared to 11/2017   PFTs 02/2018 >> no airflow obstruction or restriction.Except for Diffusing defect FEV1 99%, ratio 75, FVC 92%, DLCO 59%. 11% bronchodilator change. Mid flow reversibility.  Review of Systems neg for any significant sore throat, dysphagia, itching, sneezing, nasal congestion or excess/ purulent secretions, fever, chills, sweats, unintended wt loss, pleuritic or exertional cp, hempoptysis, orthopnea pnd or change in chronic leg swelling.   Also denies presyncope, palpitations, heartburn, abdominal pain, nausea, vomiting, diarrhea or change in bowel or urinary habits, dysuria,hematuria, rash, arthralgias, visual complaints, headache, numbness weakness or ataxia.     Objective:   Physical Exam  Gen. Pleasant, well-nourished, in no distress ENT - no thrush, no post nasal drip Neck: No JVD, no thyromegaly, no carotid bruits Lungs: no use of accessory muscles, no dullness to percussion, clear without rales or rhonchi  Cardiovascular: Rhythm regular, heart sounds  normal, no murmurs or gallops, 2+  peripheral edema Musculoskeletal: No deformities, no cyanosis or clubbing        Assessment & Plan:

## 2018-05-24 NOTE — Progress Notes (Signed)
Patient ID: Ruben Conner, male   DOB: 24-Aug-1930, 84 y.o.   MRN: [id number]    Cardiology Office Note    Date:  05/24/2018   ID:  Ruben Conner, Ruben Conner 08/23/1930, MRN [id number]  PCP:  Chipper Herb, MD  Cardiologist:   Sanda Klein, MD   No chief complaint on file.   History of Present Illness:  Ruben Conner "Ruben Conner" is a 84 y.o. male with long-standing asymptomatic bradycardia, left bundle branch block and prolonged AV conduction time, pulmonary and mediastinal sarcoidosis, who recently developed persistent atrial fibrillation and acute heart failure. He has systemic hypertension and controlled type 2 diabetes mellitus on oral anti-diabetics. He did not have any known structural heart disease prior to the last few months.  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 The abnormality was first seen on a chest x-ray performed in February 2018, following a protracted case of upper respiratory infection and bronchitis. It was hypermetabolic on PET scan, but attempts to obtain a tissue diagnosis with both bronchoscopy and mediastinoscopy were unsuccessful.  After radiation therapy he developed worsening bilateral hilar lymphadenopathy and right upper lobe radiation pneumonitis.  Repeat attempts at biopsying the abnormality in February 2019 have led to a diagnosis of sarcoidosis, based on the presence of nonnecrotizing granulomas.  Pulmonary function tests did not show evidence of significant obstruction or restriction, but diffusion capacity was decreased to 59%.  He was started on steroid therapy.  Because a lot of problems with emotional instability, insomnia and lower extremity edema and dyspnea.  He developed a lot of problems with bilateral pedal edema as well as increased cough and wheezing.  He was started on oral diuretics by Dr. Joelyn Oms, who follows him for CKD stage IV.  Actos  was discontinued.  On increased doses of torsemide, the edema resolved, but that he developed extreme weakness and had to stop it.  His ACE inhibitor and hydrochlorothiazide have been stopped.  Main has been in the 2-2 0.7 range.  June 26 he underwent an echocardiogram because of the swelling.  This showed moderate left ventricular hypertrophy and a reduction in LVEF to 35-40%.  Retrospectively, looking over that study he was in atrial fibrillation at the time, although this was not explicitly reported.  July 2 he followed up with Dr. Laurance Flatten and ECG performed at that time showed clear evidence of atrial fibrillation with controlled ventricular response.  He is not receiving any AV blocking agents.  Apixaban was started at the time.  On a lower dose of steroids and feels that his emotional symptoms are improving, although he has severe pedal edema.  He has had to buy new shoes to sizes bigger and even these are getting tight.  He does not have frank orthopnea or PND.  He denies any chest discomfort.  He is only occasionally aware of palpitations.  He has not had syncope, but does describe lightheadedness.  He has not had seizures.  His weight has fluctuated a lot recently, but it is fairly clear that his usual weight is under 200 pounds.  He weighs almost 9 pounds more than that today.  He remains concerned by the progressive dementia of his wife, Ruben Conner, who is also my patient.  Many of the decisions he takes for his own health care are impacted by the fact that he wants to survive to take care of her.  Past Medical History:  Diagnosis Date  . Cancer ([place])    lung  . Diabetes mellitus without complication ([place])   . Hyperlipidemia   . Hypertension   . Irregular heart rate    Dr. Victorino December note from 04/2017 states bradycardia, LBBB  . Pneumonia   . Renal insufficiency     Past Surgical History:  Procedure Laterality Date  . cyst removed    . ENDOBRONCHIAL ULTRASOUND Bilateral 03/23/2017    Procedure: ENDOBRONCHIAL ULTRASOUND;  Surgeon: Rigoberto Noel, MD;  Location: [facility];  Service: Cardiopulmonary;  Laterality: Bilateral;  . EYE SURGERY    . MEDIASTINOSCOPY N/A 04/08/2017   Procedure: MEDIASTINOSCOPY;  Surgeon: Grace Isaac, MD;  Location: [facility];  Service: Thoracic;  Laterality: N/A;  . MOUTH SURGERY    . ROTATOR CUFF REPAIR    . tumor remoced from neck    . VIDEO BRONCHOSCOPY WITH ENDOBRONCHIAL ULTRASOUND N/A 04/08/2017   Procedure: VIDEO BRONCHOSCOPY WITH ENDOBRONCHIAL ULTRASOUND;  Surgeon: Grace Isaac, MD;  Location: [facility];  Service: Thoracic;  Laterality: N/A;  . VIDEO BRONCHOSCOPY WITH ENDOBRONCHIAL ULTRASOUND N/A 12/08/2017   Procedure: VIDEO BRONCHOSCOPY with BRONCHIAL BIOPSIES AND ENDOBRONCHIAL ULTRASOUND WITH TRANSBRONCHIAL AND BRONCHIAL BIOPSIES;  Surgeon: Grace Isaac, MD;  Location: [facility];  Service: Thoracic;  Laterality: N/A;    Current Medications: Outpatient Medications Prior to Visit  Medication Sig Dispense Refill  . apixaban (ELIQUIS) 2.5 MG TABS tablet Take 1 tablet (2.5 mg total) by mouth 2 (two) times daily. 60 tablet 2  . Cholecalciferol (VITAMIN D3) 5000 units CAPS Take by mouth.    Marland Kitchen glimepiride (AMARYL) 4 MG tablet TAKE 1 TABLET DAILY 30 tablet 3  . glucose blood (ONETOUCH VERIO) test strip Twice Daily 100 each 12  . potassium chloride SA (K-DUR,KLOR-CON) 20 MEQ tablet Take 40 mEq by mouth daily.    . predniSONE (DELTASONE) 10 MG tablet TAKE (1) TABLET DAILY WITH BREAKFAST. 90 tablet 0   No facility-administered medications prior to visit.      Allergies:   Patient has no known allergies.   Social History   Socioeconomic History  . Marital status: Married    Spouse name: Not on file  . Number of children: 1  . Years of education: Not on file  . Highest education level: Not on file  Occupational History  . Occupation: Retired Advice worker  . Financial resource strain: Not on file  . Food insecurity:     Worry: Not on file    Inability: Not on file  . Transportation needs:    Medical: Not on file    Non-medical: Not on file  Tobacco Use  . Smoking status: Former Smoker    Packs/day: 0.10    Years: 53.00    Pack years: 5.30    Types: Cigarettes    Last attempt to quit: 11/28/2016    Years since quitting: 1.4  . Smokeless tobacco: Never Used  Substance and Sexual Activity  . Alcohol use: No    Alcohol/week: 0.0 oz  . Drug use: No  . Sexual activity: Never  Lifestyle  . Physical activity:    Days per week: Not on file    Minutes per session: Not on file  . Stress: Not on file  Relationships  . Social connections:    Talks on phone: Not on file    Gets together: Not on file    Attends religious service: Not on file    Active member  of club or organization: Not on file    Attends meetings of clubs or organizations: Not on file    Relationship status: Not on file  Other Topics Concern  . Not on file  Social History Narrative  . Not on file     Family History:  The patient's family history includes Cancer in his son; Heart disease in his father; Kidney disease in his mother; Suicidality in his son.   ROS:   Please see the history of present illness.    ROS All other systems reviewed and are negative.   PHYSICAL EXAM:   VS:  BP (!) 122/54   Pulse 92   Ht 6' (1.829 m)   Wt 208 lb 9.6 oz (94.6 kg)   BMI 28.29 kg/m     General: Alert, oriented x3, no distress Head: no evidence of trauma, PERRL, EOMI, no exophtalmos or lid lag, no myxedema, no xanthelasma; normal ears, nose and oropharynx Neck: 8-9 cm elevation in jugular venous pulsations and marked hepatojugular reflux; brisk carotid pulses without delay and no carotid bruits Chest: clear to auscultation, no signs of consolidation by percussion or palpation, normal fremitus, symmetrical and full respiratory excursions Cardiovascular: normal position and quality of the apical impulse, irregular rhythm, normal first and  split second heart sounds, no murmurs, rubs or gallops Abdomen: Appears mildly distended, no masses by palpation, no abnormal pulsatility or arterial bruits, normal bowel sounds, probable hepatomegaly Extremities: no clubbing, cyanosis; symmetrical 3+ pitting ankle and pedal edema; 2+ radial, ulnar and brachial pulses bilaterally; unable to palpate pedal pulses due to edema Neurological: grossly nonfocal Psych: Normal mood and affect, a little more emotional than his usual calm and cool self   Wt Readings from Last 3 Encounters:  05/24/18 208 lb 9.6 oz (94.6 kg)  05/24/18 207 lb 9.6 oz (94.2 kg)  05/18/18 206 lb (93.4 kg)    Studies/Labs Reviewed:   EKG:  EKG is ordered today.  It shows atrial fibrillation with occasional PVCs, nonspecific intraventricular conduction delay 122 ms, QTC 486 ms  Recent Labs: 12/08/2017: ALT 11 05/18/2018: BUN 37; Creatinine, Ser 2.10; Hemoglobin 10.2; Platelets 261; Potassium 3.1; Sodium 140; TSH 0.823   Lipid Panel    Component Value Date/Time   CHOL 137 07/23/2017 0925   TRIG 93 07/23/2017 0925   TRIG 108 07/06/2014 0936   HDL 36 (L) 07/23/2017 0925   HDL 35 (L) 07/06/2014 0936   CHOLHDL 3.8 07/23/2017 0925   LDLCALC 82 07/23/2017 0925   LDLCALC 83 07/06/2014 0936     ASSESSMENT:    1. Persistent atrial fibrillation ([place])   2. Acute systolic heart failure (HCC)   3. Cardiomyopathy, unspecified type ([place])   4. LBBB (left bundle branch block)   5. Essential hypertension   6. CKD (chronic kidney disease) stage 4, GFR 15-29 ml/min (HCC)   7. Hypercholesterolemia     PLAN:  In order of problems listed above:  1. AFib: Anticoagulation was started last week (dose adjusted for age and renal function). CHADSVasc 5 (age 73, HTN, DM, HF). He is spontaneously rate controlled without medications, but the arrhythmia is associated with heart failure decompensation.  It is hard to say which came first.  He would benefit from return to normal rhythm.  The  earliest we have been able to schedule him for a TEE guided cardioversion is Friday, July 12, due to the limited availability of Anesthesiology. This procedure has been fully reviewed with the patient and written informed consent  has been obtained.  2. CHF: He is clearly hypervolemic today.  I have asked him to increase his dose of torsemide to 20 mg every day.  (At this point he was supposed to be on 20 mg on Monday Wednesday and Friday only).  We will continue on daily torsemide as long as his weight is over 200 pounds, skipping it on days when he is under 200 pounds.  Hopefully this we will keep a balance between heart failure and kidney failure.  His most recent potassium was 3.1 so I also asked him to increase his potassium chloride supplement to 40 mEq daily, between now and the procedure on Friday we will recheck electrolytes and renal function parameters.  If he develops worsening shortness of breath he should be admitted to the hospital.  He prefers at this point to stay at home and reports that he is only mildly symptomatic at rest.  Medications to treat congestive heart failure will be very limited.  Beta-blockers cannot be safely administered unless he receives a pacemaker.  RAAS inhibitors are contraindicated with his degree of kidney failure. 3. Cardiomyopathy: The cause of the cardiomyopathy is not immediately clear, but need to keep in mind the possibility that is also related to sarcoidosis.  Tachycardia cardiomyopathy is unlikely since he is spontaneously rate controlled.  Coronary disease is obviously a concern.  He is elderly and has risk factors, but chest pain has never been a complaint and he is not known to have coronary disease.  Aortic and coronary artery vascular calcifications are clearly present on his CT chest, but not unexpected in an 84 year old.  He is not a great candidate for either invasive coronary angiography or coronary CT angiography due to advanced chronic kidney disease.   We will probably schedule him for [facility] once heart failure is compensated and atrial fibrillation has been cardioverted.  Cardiac MRI will be the optimal diagnostic test for possible cardiac sarcoidosis, but he should not receive gadolinium with his renal function. 4. 1st deg AV block and LBBB: These were present before, when he also had marked sinus bradycardia.  He will not be able to tolerate beta-blockers or antiarrhythmic medications without first implanting a pacemaker.  If her pacemaker is needed, he needs a cardiac resynchronization device due to pre-existing IVCD and high likelihood that he will require ventricular pacing.  Conduction abnormalities could be age-related, although sarcoidosis can also cause them. 5. HTN: Well controlled.  Avoid medications with negative chronotropic effect. 6. CKD 4: This was presumed to be secondary to diabetes and hypertension, but Dr. Joelyn Oms reports in his notes that retrospectively he now wonders whether there may have been a connection with a diagnosis of sarcoidosis.  Monitor renal function and potassium level carefully while we adjust medications. 7. HLP: Target LDL under 100 due to diabetes mellitus, under 70 if we discover significant CAD. Very close to target on current medical regimen per labs from September 2018.    Medication Adjustments/Labs and Tests Ordered: Current medicines are reviewed at length with the patient today.  Concerns regarding medicines are outlined above.  Medication changes, Labs and Tests ordered today are listed in the Patient Instructions below. Patient Instructions  Dr Sallyanne Kuster has recommended making the following medication changes: 1. TAKE Torsemide 20 mg daily  Weigh daily  HOLD Torsemide if your weight is less than 200 pounds 2. INCREASE Potassium to 40 mEq daily  Your physician recommends that you schedule a follow-up appointment in 2 weeks with Butch Penny  Kayleen Memos, NP in the [facility].  Dr  Sallyanne Kuster recommends that you schedule a follow-up appointment first available.      [facility] [address] [place] [address] Dept: [phone] Loc: [phone]  Ruben Conner  05/24/2018  You are scheduled for a TEE Cardioversion on Friday, July 12th, 2019 with Dr Sallyanne Kuster. Please arrive at the [facility] (Main Entrance A) at [facility]: [address] [place], [place] [address] at 9:30 am.  DIET: Nothing to eat or drink after midnight except a sip of water with medications (see medication instructions below)  Medication Instructions: Hold Glimepiride  Continue your anticoagulant: Eliquis You will need to continue your anticoagulant after your procedure until you are told by your provider that it is safe to stop   Labs: Your lab work will be done at the hospital prior to your procedure - you will need to arrive 1  hours ahead of your procedure  You must have a responsible person to drive you home and stay in the waiting area during your procedure. Failure to do so could result in cancellation.  Bring your insurance cards.  *Special Note: Every effort is made to have your procedure done on time. Occasionally there are emergencies that occur at the hospital that may cause delays. Please be patient if a delay does occur.      Signed, Sanda Klein, MD  05/24/2018 7:27 PM    [place], [place], [place]  [address] Phone: [phone]; Fax: [phone]

## 2018-05-25 LAB — CBC WITH DIFFERENTIAL/PLATELET
Basophils Absolute: 0 10*3/uL (ref 0.0–0.2)
Basos: 0 %
EOS (ABSOLUTE): 0.1 10*3/uL (ref 0.0–0.4)
EOS: 2 %
HEMATOCRIT: 31.2 % — AB (ref 37.5–51.0)
HEMOGLOBIN: 10.4 g/dL — AB (ref 13.0–17.7)
IMMATURE GRANS (ABS): 0.1 10*3/uL (ref 0.0–0.1)
IMMATURE GRANULOCYTES: 1 %
LYMPHS ABS: 0.8 10*3/uL (ref 0.7–3.1)
LYMPHS: 10 %
MCH: 29.5 pg (ref 26.6–33.0)
MCHC: 33.3 g/dL (ref 31.5–35.7)
MCV: 89 fL (ref 79–97)
MONOCYTES: 7 %
Monocytes Absolute: 0.6 10*3/uL (ref 0.1–0.9)
Neutrophils Absolute: 6.8 10*3/uL (ref 1.4–7.0)
Neutrophils: 80 %
Platelets: 302 10*3/uL (ref 150–450)
RBC: 3.52 x10E6/uL — AB (ref 4.14–5.80)
RDW: 14.1 % (ref 12.3–15.4)
WBC: 8.4 10*3/uL (ref 3.4–10.8)

## 2018-05-25 LAB — BMP8+EGFR
BUN/Creatinine Ratio: 17 (ref 10–24)
BUN: 34 mg/dL — AB (ref 8–27)
CO2: 22 mmol/L (ref 20–29)
CREATININE: 2 mg/dL — AB (ref 0.76–1.27)
Calcium: 8.7 mg/dL (ref 8.6–10.2)
Chloride: 101 mmol/L (ref 96–106)
GFR calc Af Amer: 34 mL/min/{1.73_m2} — ABNORMAL LOW (ref >59)
GFR calc non Af Amer: 30 mL/min/{1.73_m2} — ABNORMAL LOW (ref >59)
GLUCOSE: 244 mg/dL — AB (ref 65–99)
Potassium: 3.9 mmol/L (ref 3.5–5.2)
Sodium: 141 mmol/L (ref 134–144)

## 2018-05-26 ENCOUNTER — Other Ambulatory Visit: Payer: Self-pay | Admitting: Cardiovascular Disease

## 2018-05-26 DIAGNOSIS — I4819 Other persistent atrial fibrillation: Secondary | ICD-10-CM

## 2018-05-28 ENCOUNTER — Ambulatory Visit (HOSPITAL_BASED_OUTPATIENT_CLINIC_OR_DEPARTMENT_OTHER): Payer: PPO

## 2018-05-28 ENCOUNTER — Ambulatory Visit (HOSPITAL_COMMUNITY): Payer: PPO | Admitting: Certified Registered Nurse Anesthetist

## 2018-05-28 ENCOUNTER — Encounter (HOSPITAL_COMMUNITY)
Admission: RE | Disposition: A | Discharge status: Home / Self Care | Payer: Self-pay | Source: Ambulatory Visit | Attending: Cardiovascular Disease

## 2018-05-28 ENCOUNTER — Ambulatory Visit (HOSPITAL_COMMUNITY)
Admission: RE | Admit: 2018-05-28 | Discharge: 2018-05-28 | Disposition: A | Discharge status: Home / Self Care | Payer: PPO | Source: Ambulatory Visit | Attending: Cardiovascular Disease | Admitting: Cardiovascular Disease

## 2018-05-28 ENCOUNTER — Encounter (HOSPITAL_COMMUNITY): Payer: Self-pay | Admitting: *Deleted

## 2018-05-28 DIAGNOSIS — I4892 Unspecified atrial flutter: Secondary | ICD-10-CM | POA: Diagnosis not present

## 2018-05-28 DIAGNOSIS — G4700 Insomnia, unspecified: Secondary | ICD-10-CM | POA: Insufficient documentation

## 2018-05-28 DIAGNOSIS — I5021 Acute systolic (congestive) heart failure: Secondary | ICD-10-CM | POA: Diagnosis not present

## 2018-05-28 DIAGNOSIS — Z7952 Long term (current) use of systemic steroids: Secondary | ICD-10-CM | POA: Diagnosis not present

## 2018-05-28 DIAGNOSIS — I440 Atrioventricular block, first degree: Secondary | ICD-10-CM | POA: Insufficient documentation

## 2018-05-28 DIAGNOSIS — N184 Chronic kidney disease, stage 4 (severe): Secondary | ICD-10-CM | POA: Insufficient documentation

## 2018-05-28 DIAGNOSIS — D860 Sarcoidosis of lung: Secondary | ICD-10-CM | POA: Diagnosis not present

## 2018-05-28 DIAGNOSIS — I429 Cardiomyopathy, unspecified: Secondary | ICD-10-CM | POA: Insufficient documentation

## 2018-05-28 DIAGNOSIS — I130 Hypertensive heart and chronic kidney disease with heart failure and stage 1 through stage 4 chronic kidney disease, or unspecified chronic kidney disease: Secondary | ICD-10-CM | POA: Diagnosis not present

## 2018-05-28 DIAGNOSIS — Z87891 Personal history of nicotine dependence: Secondary | ICD-10-CM | POA: Insufficient documentation

## 2018-05-28 DIAGNOSIS — Z8249 Family history of ischemic heart disease and other diseases of the circulatory system: Secondary | ICD-10-CM | POA: Insufficient documentation

## 2018-05-28 DIAGNOSIS — Z7901 Long term (current) use of anticoagulants: Secondary | ICD-10-CM | POA: Insufficient documentation

## 2018-05-28 DIAGNOSIS — I340 Nonrheumatic mitral (valve) insufficiency: Secondary | ICD-10-CM | POA: Diagnosis not present

## 2018-05-28 DIAGNOSIS — E7800 Pure hypercholesterolemia, unspecified: Secondary | ICD-10-CM | POA: Diagnosis not present

## 2018-05-28 DIAGNOSIS — F603 Borderline personality disorder: Secondary | ICD-10-CM | POA: Insufficient documentation

## 2018-05-28 DIAGNOSIS — I481 Persistent atrial fibrillation: Secondary | ICD-10-CM | POA: Insufficient documentation

## 2018-05-28 DIAGNOSIS — E1122 Type 2 diabetes mellitus with diabetic chronic kidney disease: Secondary | ICD-10-CM | POA: Insufficient documentation

## 2018-05-28 DIAGNOSIS — R001 Bradycardia, unspecified: Secondary | ICD-10-CM | POA: Diagnosis not present

## 2018-05-28 DIAGNOSIS — I4891 Unspecified atrial fibrillation: Secondary | ICD-10-CM | POA: Diagnosis not present

## 2018-05-28 DIAGNOSIS — I447 Left bundle-branch block, unspecified: Secondary | ICD-10-CM | POA: Diagnosis not present

## 2018-05-28 DIAGNOSIS — I4819 Other persistent atrial fibrillation: Secondary | ICD-10-CM

## 2018-05-28 HISTORY — PX: TEE WITHOUT CARDIOVERSION: SHX5443

## 2018-05-28 SURGERY — ECHOCARDIOGRAM, TRANSESOPHAGEAL
Anesthesia: General

## 2018-05-28 MED ORDER — SODIUM CHLORIDE 0.9 % IV SOLN: INTRAVENOUS | Status: DC

## 2018-05-28 MED ORDER — AMIODARONE HCL 200 MG PO TABS: 200.0000 mg | ORAL_TABLET | Freq: Every day | ORAL | 3 refills | Status: DC

## 2018-05-28 MED ORDER — SODIUM CHLORIDE 0.9 % IV SOLN
INTRAVENOUS | Status: DC
  Administered 2018-05-28: 10:00:00 via INTRAVENOUS

## 2018-05-28 MED ORDER — HYDROCORTISONE 1 % EX CREA
1.0000 "application " | TOPICAL_CREAM | Freq: Three times a day (TID) | CUTANEOUS | Status: DC | PRN
  Filled 2018-05-28: qty 28

## 2018-05-28 MED ORDER — LACTATED RINGERS IV SOLN
INTRAVENOUS | Status: DC
  Administered 2018-05-28: 10:00:00 via INTRAVENOUS

## 2018-05-28 MED ORDER — PROPOFOL 500 MG/50ML IV EMUL
INTRAVENOUS | Status: DC | PRN
  Administered 2018-05-28: 50 ug/kg/min via INTRAVENOUS

## 2018-05-28 MED ORDER — PROPOFOL 10 MG/ML IV BOLUS
INTRAVENOUS | Status: DC | PRN
  Administered 2018-05-28 (×2): 20 mg via INTRAVENOUS

## 2018-05-28 NOTE — Discharge Instructions (Signed)

## 2018-05-28 NOTE — Interval H&P Note (Signed)
History and Physical Interval Note:  05/28/2018 9:30 AM  Ruben Conner  has presented today for surgery, with the diagnosis of A-FIB  The various methods of treatment have been discussed with the patient and family. After consideration of risks, benefits and other options for treatment, the patient has consented to  Procedure(s): TRANSESOPHAGEAL ECHOCARDIOGRAM (TEE) (N/A) CARDIOVERSION (N/A) as a surgical intervention .  The patient's history has been reviewed, patient examined, no change in status, stable for surgery.  I have reviewed the patient's chart and labs.  Questions were answered to the patient's satisfaction.     Viana Sleep

## 2018-05-28 NOTE — Anesthesia Postprocedure Evaluation (Signed)
Anesthesia Post Note  Patient: Ruben Conner, Ruben Conner  Procedure(s) Performed: TRANSESOPHAGEAL ECHOCARDIOGRAM (TEE) (N/A )     Patient location during evaluation: PACU Anesthesia Type: General Level of consciousness: awake and alert Pain management: pain level controlled Vital Signs Assessment: post-procedure vital signs reviewed and stable Respiratory status: spontaneous breathing, nonlabored ventilation, respiratory function stable and patient connected to nasal cannula oxygen Cardiovascular status: blood pressure returned to baseline and stable Postop Assessment: no apparent nausea or vomiting Anesthetic complications: no    Last Vitals:  Vitals:   05/28/18 1115 05/28/18 1125  BP: (!) 152/78 (!) 153/76  Pulse: 90 83  Resp: (!) 24 19  Temp:    SpO2: 99% 98%    Last Pain:  Vitals:   05/28/18 1047  TempSrc: Oral  PainSc: 0-No pain                 Montez Hageman

## 2018-05-28 NOTE — Progress Notes (Addendum)
Discussed the findings on TEE in detail with the patient.  There is evidence of severe cardiomyopathy with global hypokinesis and ejection fraction of 30%.  There is severe-lateral wall dyssynchrony related to left bundle branch block (which is a long-standing abnormality). No clear evidence of regional wall motion abnormalities to suggest coronary insufficiency, although this is highly likely in an elderly gentleman with diabetes mellitus.  ECG is nondiagnostic for ischemia due to conduction abnormality.  During his short stay in the hospital today his rhythm repeatedly changed between atrial fibrillation with spontaneously controlled ventricular response and sinus rhythm/mild sinus bradycardia with very long first-degree AV block. In the past, beta-blockers had to be stopped due to severe sinus bradycardia.  He has had a left bundle branch block for many years, but only recently developed heart failure.  He has never had angina pectoris.  The recent diagnosis of pulmonary sarcoidosis raises the possibility that he has an infiltrative cardiomyopathy.  Dr. Gilford Rile has advanced chronic kidney disease, with a GFR bordering on stage IV (30 mL/min).  His nephrologist is Dr. Joelyn Oms.  From Dr. Thomes Dinning point of view, the most important goal is to keep him healthy enough to care for his wife [name], disabled due to a previous stroke, multiple cardiovascular problems and moderate dementia.  Keeping this in mind, his focus is primarily on interventions that would assure that he has good functional status and avoid the risk of disability.  These considerations would outweigh discussions regarding procedures that could potentially provide more longevity, but at high risk of complications and disability.  I think he will benefit most from improved left ventricular systolic function via cardiac resynchronization therapy with a biventricular pacemaker.  This can be done with a low-dose of iodinated contrast that  hopefully will not put his renal function at great risk.  I have tentatively scheduled him to meet with Dr. Cristopher Peru on Monday morning, to go ahead with implantation of a CRT-P device.  Considering his age and his priorities, defibrillator therapy is not indicated.  The procedure, its pros and cons and possible complications, including lead dislodgment, need for reoperation, contrast nephrotoxicity and renal failure, bleeding, pneumothorax and need for chest tube, hardware infection and need for removal, even small discussed in detail with him.  He understands and agrees to proceed.  As far as trying to establish whether or not he has coronary disease, we will pursue a very conservative approach.  Plan to schedule him for [facility] after he has had a chance for the pacemaker leads to become stable so, that he can lift his arms above his head for the imaging study.  In order to justify cardiac catheterization and increased risk for renal failure, there would have to be substantial evidence of reversible ischemia.  Similarly, can consider cardiac MRI to see if there is evidence of cardiac sarcoidosis, although this will probably have little impact on the choice of therapy.   Sanda Klein, MD, [facility] [phone] office [phone] pager

## 2018-05-28 NOTE — Anesthesia Preprocedure Evaluation (Signed)
Anesthesia Evaluation  Patient identified by MRN, date of birth, ID band Patient awake    Reviewed: Allergy & Precautions, NPO status , Patient's Chart, lab work & pertinent test results  Airway Mallampati: I       Dental  (+) Lower Dentures, Upper Dentures   Pulmonary former smoker,    Pulmonary exam normal        Cardiovascular hypertension, Pt. on medications Normal cardiovascular exam+ dysrhythmias Atrial Fibrillation  Rhythm:Regular Rate:Normal     Neuro/Psych negative psych ROS   GI/Hepatic negative GI ROS, Neg liver ROS,   Endo/Other  diabetes  Renal/GU Renal InsufficiencyRenal disease  negative genitourinary   Musculoskeletal negative musculoskeletal ROS (+)   Abdominal Normal abdominal exam  (+)   Peds  Hematology   Anesthesia Other Findings   Reproductive/Obstetrics                             Anesthesia Physical  Anesthesia Plan  ASA: III  Anesthesia Plan: General   Post-op Pain Management:    Induction:   PONV Risk Score and Plan: 2 and Ondansetron and Treatment may vary due to age or medical condition  Airway Management Planned: Mask and Nasal Cannula  Additional Equipment:   Intra-op Plan:   Post-operative Plan:   Informed Consent: I have reviewed the patients History and Physical, chart, labs and discussed the procedure including the risks, benefits and alternatives for the proposed anesthesia with the patient or authorized representative who has indicated his/her understanding and acceptance.   Dental advisory given  Plan Discussed with: CRNA and Surgeon  Anesthesia Plan Comments:         Anesthesia Quick Evaluation

## 2018-05-28 NOTE — Progress Notes (Signed)
  Echocardiogram Echocardiogram Transesophageal has been performed.  Ruben Conner M 05/28/2018, 12:16 PM

## 2018-05-28 NOTE — Op Note (Signed)
INDICATIONS: atrial fibrillation  PROCEDURE:   Informed consent was obtained prior to the procedure. The risks, benefits and alternatives for the procedure were discussed and the patient comprehended these risks.  Risks include, but are not limited to, cough, sore throat, vomiting, nausea, somnolence, esophageal and stomach trauma or perforation, bleeding, low blood pressure, aspiration, pneumonia, infection, trauma to the teeth and death.    After a procedural time-out, the oropharynx was anesthetized with 20% benzocaine spray.   During this procedure the patient was administered IV propofol, Dr. Marcell Barlow. The transesophageal probe was inserted in the esophagus and stomach without difficulty and multiple views were obtained.  The patient was kept under observation until the patient left the procedure room.  The patient left the procedure room in stable condition.   Agitated microbubble saline contrast was administered.  COMPLICATIONS:    There were no immediate complications.  FINDINGS:  Severely depressed left ventricular systolic function. EF 25%. Mild MR No LA thrombus. Converted to NSR spontaneously.  After TEE, ECG and telemetry show frequent rhythm changes from atrial fibrillation with controlled ventricular rate 80-90s and sinus rhythm around 60 bpm with long first-degree AV block.  Broad left bundle branch block, QRS 150 ms.  RECOMMENDATIONS:    Start antiarrhythmics to prevent arrhythmia recurrence. Only safe option is amiodarone. Will use low dose due to conduction system disease and refer for opinion re: CRT-P. Evaluate cause of cardiomyopathy. Options are limited by CKD.  Will be going for a biventricular pacemaker on Monday the 15th.  We will be giving him a very small amount of plan Lexiscan Myoview and only proceed to cardiac cath if there is convincing evidence of reversible ischemia.  Time Spent Directly with the Patient:  30 minutes   Ruben Conner 05/28/2018,  10:40 AM

## 2018-05-28 NOTE — Transfer of Care (Signed)
Immediate Anesthesia Transfer of Care Note  Patient: Ruben Conner  Procedure(s) Performed: TRANSESOPHAGEAL ECHOCARDIOGRAM (TEE) (N/A )  Patient Location: PACU  Anesthesia Type:MAC  Level of Consciousness: drowsy and patient cooperative  Airway & Oxygen Therapy: Patient Spontanous Breathing and Patient connected to nasal cannula oxygen  Post-op Assessment: Report given to RN, Post -op Vital signs reviewed and stable and Patient moving all extremities X 4  Post vital signs: Reviewed and stable  Last Vitals:  Vitals Value Taken Time  BP    Temp    Pulse    Resp    SpO2      Last Pain:  Vitals:   05/28/18 1003  TempSrc: Oral  PainSc: 0-No pain         Complications: No apparent anesthesia complications

## 2018-05-30 ENCOUNTER — Encounter (HOSPITAL_COMMUNITY): Payer: Self-pay | Admitting: Cardiovascular Disease

## 2018-05-31 ENCOUNTER — Other Ambulatory Visit: Payer: Self-pay

## 2018-05-31 ENCOUNTER — Encounter (HOSPITAL_COMMUNITY): Payer: Self-pay | Admitting: Internal Medicine

## 2018-05-31 ENCOUNTER — Ambulatory Visit (HOSPITAL_COMMUNITY)
Admission: RE | Disposition: A | Discharge status: Home / Self Care | Payer: Self-pay | Source: Ambulatory Visit | Attending: Internal Medicine

## 2018-05-31 ENCOUNTER — Ambulatory Visit (HOSPITAL_COMMUNITY)
Admission: RE | Admit: 2018-05-31 | Discharge: 2018-06-01 | Disposition: A | Discharge status: Home / Self Care | Payer: PPO | Source: Ambulatory Visit | Attending: Internal Medicine | Admitting: Internal Medicine

## 2018-05-31 DIAGNOSIS — E1122 Type 2 diabetes mellitus with diabetic chronic kidney disease: Secondary | ICD-10-CM | POA: Insufficient documentation

## 2018-05-31 DIAGNOSIS — I5042 Chronic combined systolic (congestive) and diastolic (congestive) heart failure: Secondary | ICD-10-CM | POA: Diagnosis present

## 2018-05-31 DIAGNOSIS — I447 Left bundle-branch block, unspecified: Secondary | ICD-10-CM | POA: Diagnosis present

## 2018-05-31 DIAGNOSIS — Z87891 Personal history of nicotine dependence: Secondary | ICD-10-CM | POA: Diagnosis not present

## 2018-05-31 DIAGNOSIS — I130 Hypertensive heart and chronic kidney disease with heart failure and stage 1 through stage 4 chronic kidney disease, or unspecified chronic kidney disease: Secondary | ICD-10-CM | POA: Insufficient documentation

## 2018-05-31 DIAGNOSIS — I481 Persistent atrial fibrillation: Secondary | ICD-10-CM | POA: Insufficient documentation

## 2018-05-31 DIAGNOSIS — E7800 Pure hypercholesterolemia, unspecified: Secondary | ICD-10-CM | POA: Insufficient documentation

## 2018-05-31 DIAGNOSIS — Z923 Personal history of irradiation: Secondary | ICD-10-CM | POA: Diagnosis not present

## 2018-05-31 DIAGNOSIS — I5022 Chronic systolic (congestive) heart failure: Secondary | ICD-10-CM | POA: Diagnosis not present

## 2018-05-31 DIAGNOSIS — D869 Sarcoidosis, unspecified: Secondary | ICD-10-CM | POA: Diagnosis not present

## 2018-05-31 DIAGNOSIS — Z7901 Long term (current) use of anticoagulants: Secondary | ICD-10-CM | POA: Diagnosis not present

## 2018-05-31 DIAGNOSIS — G4700 Insomnia, unspecified: Secondary | ICD-10-CM | POA: Diagnosis not present

## 2018-05-31 DIAGNOSIS — I428 Other cardiomyopathies: Secondary | ICD-10-CM | POA: Diagnosis not present

## 2018-05-31 DIAGNOSIS — I5023 Acute on chronic systolic (congestive) heart failure: Secondary | ICD-10-CM | POA: Diagnosis not present

## 2018-05-31 DIAGNOSIS — N184 Chronic kidney disease, stage 4 (severe): Secondary | ICD-10-CM | POA: Insufficient documentation

## 2018-05-31 DIAGNOSIS — F0390 Unspecified dementia without behavioral disturbance: Secondary | ICD-10-CM | POA: Diagnosis not present

## 2018-05-31 DIAGNOSIS — Z950 Presence of cardiac pacemaker: Secondary | ICD-10-CM

## 2018-05-31 DIAGNOSIS — I4430 Unspecified atrioventricular block: Secondary | ICD-10-CM | POA: Diagnosis not present

## 2018-05-31 DIAGNOSIS — Z7984 Long term (current) use of oral hypoglycemic drugs: Secondary | ICD-10-CM | POA: Insufficient documentation

## 2018-05-31 HISTORY — PX: BIV PACEMAKER INSERTION CRT-P: EP1199

## 2018-05-31 LAB — GLUCOSE, CAPILLARY
GLUCOSE-CAPILLARY: 159 mg/dL — AB (ref 70–99)
Glucose-Capillary: 141 mg/dL — ABNORMAL HIGH (ref 70–99)

## 2018-05-31 LAB — SURGICAL PCR SCREEN
MRSA, PCR: NEGATIVE
Staphylococcus aureus: NEGATIVE

## 2018-05-31 SURGERY — BIV PACEMAKER INSERTION CRT-P
Anesthesia: LOCAL

## 2018-05-31 MED ORDER — IOPAMIDOL (ISOVUE-370) INJECTION 76%
INTRAVENOUS | Status: DC | PRN
  Administered 2018-05-31: 46 mL

## 2018-05-31 MED ORDER — MIDAZOLAM HCL 5 MG/5ML IJ SOLN
INTRAMUSCULAR | Status: AC
  Filled 2018-05-31: qty 5

## 2018-05-31 MED ORDER — SODIUM CHLORIDE 0.9 % IV SOLN
INTRAVENOUS | Status: AC
  Filled 2018-05-31: qty 2

## 2018-05-31 MED ORDER — ACETAMINOPHEN 500 MG PO TABS: 1000.0000 mg | ORAL_TABLET | Freq: Four times a day (QID) | ORAL | Status: DC | PRN

## 2018-05-31 MED ORDER — SODIUM CHLORIDE 0.9 % IV SOLN: 80.0000 mg | INTRAVENOUS | Status: DC

## 2018-05-31 MED ORDER — AMIODARONE HCL 200 MG PO TABS
200.0000 mg | ORAL_TABLET | Freq: Every day | ORAL | Status: DC
  Administered 2018-06-01: 200 mg via ORAL
  Filled 2018-05-31: qty 1

## 2018-05-31 MED ORDER — CEFAZOLIN SODIUM-DEXTROSE 2-4 GM/100ML-% IV SOLN
2.0000 g | INTRAVENOUS | Status: AC
  Administered 2018-05-31: 2 g via INTRAVENOUS

## 2018-05-31 MED ORDER — SODIUM CHLORIDE 0.9 % IV SOLN
INTRAVENOUS | Status: DC
  Administered 2018-05-31: 10:00:00 via INTRAVENOUS

## 2018-05-31 MED ORDER — IOPAMIDOL (ISOVUE-370) INJECTION 76%
INTRAVENOUS | Status: AC
  Filled 2018-05-31: qty 50

## 2018-05-31 MED ORDER — TORSEMIDE 20 MG PO TABS
40.0000 mg | ORAL_TABLET | Freq: Every day | ORAL | Status: DC
  Administered 2018-06-01: 40 mg via ORAL
  Filled 2018-05-31: qty 2

## 2018-05-31 MED ORDER — CEFAZOLIN SODIUM-DEXTROSE 1-4 GM/50ML-% IV SOLN
1.0000 g | Freq: Two times a day (BID) | INTRAVENOUS | Status: AC
  Administered 2018-05-31 – 2018-06-01 (×2): 1 g via INTRAVENOUS
  Filled 2018-05-31 (×2): qty 50

## 2018-05-31 MED ORDER — FENTANYL CITRATE (PF) 100 MCG/2ML IJ SOLN
INTRAMUSCULAR | Status: DC | PRN
  Administered 2018-05-31 (×6): 12.5 ug via INTRAVENOUS

## 2018-05-31 MED ORDER — FENTANYL CITRATE (PF) 100 MCG/2ML IJ SOLN
INTRAMUSCULAR | Status: AC
  Filled 2018-05-31: qty 2

## 2018-05-31 MED ORDER — MUPIROCIN 2 % EX OINT
1.0000 "application " | TOPICAL_OINTMENT | Freq: Once | CUTANEOUS | Status: AC
  Administered 2018-05-31: 1 via TOPICAL

## 2018-05-31 MED ORDER — POTASSIUM CHLORIDE CRYS ER 20 MEQ PO TBCR
40.0000 meq | EXTENDED_RELEASE_TABLET | Freq: Every day | ORAL | Status: DC
  Administered 2018-06-01: 40 meq via ORAL
  Filled 2018-05-31: qty 2

## 2018-05-31 MED ORDER — METOPROLOL TARTRATE 5 MG/5ML IV SOLN
INTRAVENOUS | Status: DC | PRN
  Administered 2018-05-31: 5 mg via INTRAVENOUS

## 2018-05-31 MED ORDER — HEPARIN (PORCINE) IN NACL 1000-0.9 UT/500ML-% IV SOLN
INTRAVENOUS | Status: DC | PRN
  Administered 2018-05-31: 500 mL

## 2018-05-31 MED ORDER — ACETAMINOPHEN 325 MG PO TABS: 325.0000 mg | ORAL_TABLET | ORAL | Status: DC | PRN

## 2018-05-31 MED ORDER — MUPIROCIN 2 % EX OINT
TOPICAL_OINTMENT | CUTANEOUS | Status: AC
  Filled 2018-05-31: qty 22

## 2018-05-31 MED ORDER — ONDANSETRON HCL 4 MG/2ML IJ SOLN: 4.0000 mg | Freq: Four times a day (QID) | INTRAMUSCULAR | Status: DC | PRN

## 2018-05-31 MED ORDER — LIDOCAINE HCL (PF) 1 % IJ SOLN
INTRAMUSCULAR | Status: DC | PRN
  Administered 2018-05-31: 60 mL

## 2018-05-31 MED ORDER — CHLORHEXIDINE GLUCONATE 4 % EX LIQD: 60.0000 mL | Freq: Once | CUTANEOUS | Status: DC

## 2018-05-31 MED ORDER — VITAMIN D3 25 MCG (1000 UNIT) PO TABS
5000.0000 [IU] | ORAL_TABLET | Freq: Every evening | ORAL | Status: DC
  Administered 2018-05-31: 5000 [IU] via ORAL
  Filled 2018-05-31 (×2): qty 5

## 2018-05-31 MED ORDER — HEPARIN (PORCINE) IN NACL 1000-0.9 UT/500ML-% IV SOLN
INTRAVENOUS | Status: AC
  Filled 2018-05-31: qty 500

## 2018-05-31 MED ORDER — CEFAZOLIN SODIUM-DEXTROSE 2-4 GM/100ML-% IV SOLN
INTRAVENOUS | Status: AC
  Filled 2018-05-31: qty 100

## 2018-05-31 MED ORDER — METOPROLOL TARTRATE 5 MG/5ML IV SOLN
INTRAVENOUS | Status: AC
  Filled 2018-05-31: qty 5

## 2018-05-31 MED ORDER — LIDOCAINE HCL 1 % IJ SOLN
INTRAMUSCULAR | Status: AC
  Filled 2018-05-31: qty 60

## 2018-05-31 MED ORDER — MIDAZOLAM HCL 5 MG/5ML IJ SOLN
INTRAMUSCULAR | Status: DC | PRN
  Administered 2018-05-31 (×6): 1 mg via INTRAVENOUS

## 2018-05-31 SURGICAL SUPPLY — 18 items
CABLE SURGICAL S-101-97-12 (CABLE) ×2 IMPLANT
CATH CPS DIRECT 135 DS2C020 (CATHETERS) ×2 IMPLANT
CATH HEXAPOLAR DAMATO 6F (CATHETERS) ×1 IMPLANT
CPS IMPLANT KIT 410190 (MISCELLANEOUS) ×1 IMPLANT
KIT ESSENTIALS PG (KITS) ×1 IMPLANT
LEAD ATTAIN PERFORM ST 4398-88 (Lead) ×1 IMPLANT
LEAD TENDRIL MRI 52CM LPA1200M (Lead) ×1 IMPLANT
LEAD TENDRIL MRI 58CM LPA1200M (Lead) ×1 IMPLANT
PACEMAKER QUDR ALLR CRT PM3562 (Pacemaker) IMPLANT
PAD DEFIB LIFELINK (PAD) ×1 IMPLANT
PMKR QUADRA ALLURE CRT PM3562 (Pacemaker) ×2 IMPLANT
SHIELD RADPAD SCOOP 12X17 (MISCELLANEOUS) ×1 IMPLANT
SLITTER 6232ADJ (MISCELLANEOUS) ×1 IMPLANT
SLITTER UNIVERSAL DS2A003 (MISCELLANEOUS) ×1 IMPLANT
TRAY PACEMAKER INSERTION (PACKS) ×2 IMPLANT
WIRE HI TORQ VERSACORE-J 145CM (WIRE) ×1 IMPLANT
WIRE LUGE 182CM (WIRE) ×1 IMPLANT
WIRE MAILMAN 182CM (WIRE) ×2 IMPLANT

## 2018-05-31 NOTE — H&P (Signed)
Cardiology Office Note    Date:  05/24/2018   ID:  Y[place], Pacella 05-22-1930, MRN [id number]  PCP:  Chipper Herb, MD  Cardiologist:   Sanda Klein, MD   No chief complaint on file.   History of Present Illness:  Ruben Conner "[name]" is a 84 y.o. male with long-standing asymptomatic bradycardia, left bundle branch block and prolonged AV conduction time, pulmonary and mediastinal sarcoidosis, who recently developed persistent atrial fibrillation and acute heart failure. He has systemic hypertension and controlled type 2 diabetes mellitus on oral anti-diabetics. He did not have any known structural heart disease prior to the last few months.  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 The abnormality was first seen on a chest x-ray performed in February 2018, following a protracted case of upper respiratory infection and bronchitis. It was hypermetabolic on PET scan, but attempts to obtain a tissue diagnosis with both bronchoscopy and mediastinoscopy were unsuccessful.  After radiation therapy he developed worsening bilateral hilar lymphadenopathy and right upper lobe radiation pneumonitis.  Repeat attempts at biopsying the abnormality in February 2019 have led to a diagnosis of sarcoidosis, based on the presence of nonnecrotizing granulomas.  Pulmonary function tests did not show evidence of significant obstruction or restriction, but diffusion capacity was decreased to 59%.  He was started on steroid therapy.  Because a lot of problems with emotional instability, insomnia and lower extremity edema and dyspnea.  He developed a lot of problems with bilateral pedal edema as well as increased cough and wheezing.  He was started on oral diuretics by Dr. Joelyn Oms, who follows him for CKD stage IV.  Actos was discontinued.  On increased doses of torsemide, the edema  resolved, but that he developed extreme weakness and had to stop it.  His ACE inhibitor and hydrochlorothiazide have been stopped.  Main has been in the 2-2 0.7 range.  June 26 he underwent an echocardiogram because of the swelling.  This showed moderate left ventricular hypertrophy and a reduction in LVEF to 35-40%.  Retrospectively, looking over that study he was in atrial fibrillation at the time, although this was not explicitly reported.  July 2 he followed up with Dr. Laurance Flatten and ECG performed at that time showed clear evidence of atrial fibrillation with controlled ventricular response.  He is not receiving any AV blocking agents.  Apixaban was started at the time.  On a lower dose of steroids and feels that his emotional symptoms are improving, although he has severe pedal edema.  He has had to buy new shoes to sizes bigger and even these are getting tight.  He does not have frank orthopnea or PND.  He denies any chest discomfort.  He is only occasionally aware of palpitations.  He has not had syncope, but does describe lightheadedness.  He has not had seizures.  His weight has fluctuated a lot recently, but it is fairly clear that his usual weight is under 200 pounds.  He weighs almost 9 pounds more than that today.  He remains concerned by the progressive dementia of his wife, Ruben Conner, who is also my patient.  Many of the decisions he takes for his own health care are impacted by the fact that he wants to survive to take care of her.      Past Medical History:  Diagnosis Date  . Cancer ([place])  lung  . Diabetes mellitus without complication ([place])   . Hyperlipidemia   . Hypertension   . Irregular heart rate    Dr. Victorino December note from 04/2017 states bradycardia, LBBB  . Pneumonia   . Renal insufficiency          Past Surgical History:  Procedure Laterality Date  . cyst removed    . ENDOBRONCHIAL ULTRASOUND Bilateral 03/23/2017   Procedure: ENDOBRONCHIAL ULTRASOUND;   Surgeon: Rigoberto Noel, MD;  Location: [facility];  Service: Cardiopulmonary;  Laterality: Bilateral;  . EYE SURGERY    . MEDIASTINOSCOPY N/A 04/08/2017   Procedure: MEDIASTINOSCOPY;  Surgeon: Grace Isaac, MD;  Location: [facility];  Service: Thoracic;  Laterality: N/A;  . MOUTH SURGERY    . ROTATOR CUFF REPAIR    . tumor remoced from neck    . VIDEO BRONCHOSCOPY WITH ENDOBRONCHIAL ULTRASOUND N/A 04/08/2017   Procedure: VIDEO BRONCHOSCOPY WITH ENDOBRONCHIAL ULTRASOUND;  Surgeon: Grace Isaac, MD;  Location: [facility];  Service: Thoracic;  Laterality: N/A;  . VIDEO BRONCHOSCOPY WITH ENDOBRONCHIAL ULTRASOUND N/A 12/08/2017   Procedure: VIDEO BRONCHOSCOPY with BRONCHIAL BIOPSIES AND ENDOBRONCHIAL ULTRASOUND WITH TRANSBRONCHIAL AND BRONCHIAL BIOPSIES;  Surgeon: Grace Isaac, MD;  Location: [facility] OR;  Service: Thoracic;  Laterality: N/A;    Current Medications:       Outpatient Medications Prior to Visit  Medication Sig Dispense Refill  . apixaban (ELIQUIS) 2.5 MG TABS tablet Take 1 tablet (2.5 mg total) by mouth 2 (two) times daily. 60 tablet 2  . Cholecalciferol (VITAMIN D3) 5000 units CAPS Take by mouth.    Marland Kitchen glimepiride (AMARYL) 4 MG tablet TAKE 1 TABLET DAILY 30 tablet 3  . glucose blood (ONETOUCH VERIO) test strip Twice Daily 100 each 12  . potassium chloride SA (K-DUR,KLOR-CON) 20 MEQ tablet Take 40 mEq by mouth daily.    . predniSONE (DELTASONE) 10 MG tablet TAKE (1) TABLET DAILY WITH BREAKFAST. 90 tablet 0   No facility-administered medications prior to visit.      Allergies:   Patient has no known allergies.   Social History        Socioeconomic History  . Marital status: Married    Spouse name: Not on file  . Number of children: 1  . Years of education: Not on file  . Highest education level: Not on file  Occupational History  . Occupation: Retired Advice worker  . Financial resource strain: Not on file  . Food insecurity:     Worry: Not on file    Inability: Not on file  . Transportation needs:    Medical: Not on file    Non-medical: Not on file  Tobacco Use  . Smoking status: Former Smoker    Packs/day: 0.10    Years: 53.00    Pack years: 5.30    Types: Cigarettes    Last attempt to quit: 11/28/2016    Years since quitting: 1.4  . Smokeless tobacco: Never Used  Substance and Sexual Activity  . Alcohol use: No    Alcohol/week: 0.0 oz  . Drug use: No  . Sexual activity: Never  Lifestyle  . Physical activity:    Days per week: Not on file    Minutes per session: Not on file  . Stress: Not on file  Relationships  . Social connections:    Talks on phone: Not on file    Gets together: Not on file    Attends religious service: Not on file  Active member of club or organization: Not on file    Attends meetings of clubs or organizations: Not on file    Relationship status: Not on file  Other Topics Concern  . Not on file  Social History Narrative  . Not on file     Family History:  The patient's family history includes Cancer in his son; Heart disease in his father; Kidney disease in his mother; Suicidality in his son.   ROS:   Please see the history of present illness.    ROS All other systems reviewed and are negative.   PHYSICAL EXAM:   VS:  BP (!) 122/54   Pulse 92   Ht 6' (1.829 m)   Wt 208 lb 9.6 oz (94.6 kg)   BMI 28.29 kg/m     General: Alert, oriented x3, no distress Head: no evidence of trauma, PERRL, EOMI, no exophtalmos or lid lag, no myxedema, no xanthelasma; normal ears, nose and oropharynx Neck: 8-9 cm elevation in jugular venous pulsations and marked hepatojugular reflux; brisk carotid pulses without delay and no carotid bruits Chest: clear to auscultation, no signs of consolidation by percussion or palpation, normal fremitus, symmetrical and full respiratory excursions Cardiovascular: normal position and quality of the apical  impulse, irregular rhythm, normal first and split second heart sounds, no murmurs, rubs or gallops Abdomen: Appears mildly distended, no masses by palpation, no abnormal pulsatility or arterial bruits, normal bowel sounds, probable hepatomegaly Extremities: no clubbing, cyanosis; symmetrical 3+ pitting ankle and pedal edema; 2+ radial, ulnar and brachial pulses bilaterally; unable to palpate pedal pulses due to edema Neurological: grossly nonfocal Psych: Normal mood and affect, a little more emotional than his usual calm and cool self      Wt Readings from Last 3 Encounters:  05/24/18 208 lb 9.6 oz (94.6 kg)  05/24/18 207 lb 9.6 oz (94.2 kg)  05/18/18 206 lb (93.4 kg)    Studies/Labs Reviewed:   EKG:  EKG is ordered today.  It shows atrial fibrillation with occasional PVCs, nonspecific intraventricular conduction delay 122 ms, QTC 486 ms  Recent Labs: 12/08/2017: ALT 11 05/18/2018: BUN 37; Creatinine, Ser 2.10; Hemoglobin 10.2; Platelets 261; Potassium 3.1; Sodium 140; TSH 0.823   Lipid Panel Labs(Brief)          Component Value Date/Time   CHOL 137 07/23/2017 0925   TRIG 93 07/23/2017 0925   TRIG 108 07/06/2014 0936   HDL 36 (L) 07/23/2017 0925   HDL 35 (L) 07/06/2014 0936   CHOLHDL 3.8 07/23/2017 0925   LDLCALC 82 07/23/2017 0925   LDLCALC 83 07/06/2014 0936       ASSESSMENT:    1. Persistent atrial fibrillation ([place])   2. Acute systolic heart failure (HCC)   3. Cardiomyopathy, unspecified type ([place])   4. LBBB (left bundle branch block)   5. Essential hypertension   6. CKD (chronic kidney disease) stage 4, GFR 15-29 ml/min (HCC)   7. Hypercholesterolemia     PLAN:  In order of problems listed above:  1. AFib: Anticoagulation was started last week (dose adjusted for age and renal function). CHADSVasc 5 (age 70, HTN, DM, HF). He is spontaneously rate controlled without medications, but the arrhythmia is associated with heart failure decompensation.   It is hard to say which came first.  He would benefit from return to normal rhythm.  The earliest we have been able to schedule him for a TEE guided cardioversion is Friday, July 12, due to the limited availability of  Anesthesiology. This procedure has been fully reviewed with the patient and written informed consent has been obtained.  2. CHF: He is clearly hypervolemic today.  I have asked him to increase his dose of torsemide to 20 mg every day.  (At this point he was supposed to be on 20 mg on Monday Wednesday and Friday only).  We will continue on daily torsemide as long as his weight is over 200 pounds, skipping it on days when he is under 200 pounds.  Hopefully this we will keep a balance between heart failure and kidney failure.  His most recent potassium was 3.1 so I also asked him to increase his potassium chloride supplement to 40 mEq daily, between now and the procedure on Friday we will recheck electrolytes and renal function parameters.  If he develops worsening shortness of breath he should be admitted to the hospital.  He prefers at this point to stay at home and reports that he is only mildly symptomatic at rest.  Medications to treat congestive heart failure will be very limited.  Beta-blockers cannot be safely administered unless he receives a pacemaker.  RAAS inhibitors are contraindicated with his degree of kidney failure. 3. Cardiomyopathy: The cause of the cardiomyopathy is not immediately clear, but need to keep in mind the possibility that is also related to sarcoidosis.  Tachycardia cardiomyopathy is unlikely since he is spontaneously rate controlled.  Coronary disease is obviously a concern.  He is elderly and has risk factors, but chest pain has never been a complaint and he is not known to have coronary disease.  Aortic and coronary artery vascular calcifications are clearly present on his CT chest, but not unexpected in an 84 year old.  He is not a great candidate for either invasive  coronary angiography or coronary CT angiography due to advanced chronic kidney disease.  We will probably schedule him for [facility] once heart failure is compensated and atrial fibrillation has been cardioverted.  Cardiac MRI will be the optimal diagnostic test for possible cardiac sarcoidosis, but he should not receive gadolinium with his renal function. 4. 1st deg AV block and LBBB: These were present before, when he also had marked sinus bradycardia.  He will not be able to tolerate beta-blockers or antiarrhythmic medications without first implanting a pacemaker.  If her pacemaker is needed, he needs a cardiac resynchronization device due to pre-existing IVCD and high likelihood that he will require ventricular pacing.  Conduction abnormalities could be age-related, although sarcoidosis can also cause them. 5. HTN: Well controlled.  Avoid medications with negative chronotropic effect. 6. CKD 4: This was presumed to be secondary to diabetes and hypertension, but Dr. Joelyn Oms reports in his notes that retrospectively he now wonders whether there may have been a connection with a diagnosis of sarcoidosis.  Monitor renal function and potassium level carefully while we adjust medications. 7. HLP: Target LDL under 100 due to diabetes mellitus, under 70 if we discover significant CAD. Very close to target on current medical regimen per labs from September 2018.    Medication Adjustments/Labs and Tests Ordered: Current medicines are reviewed at length with the patient today.  Concerns regarding medicines are outlined above.  Medication changes, Labs and Tests ordered today are listed in the Patient Instructions below. Patient Instructions  Dr Sallyanne Kuster has recommended making the following medication changes: 1. TAKE Torsemide 20 mg daily             Weigh daily  HOLD Torsemide if your weight is less than 200 pounds 2. INCREASE Potassium to 40 mEq daily  Your physician recommends that  you schedule a follow-up appointment in 2 weeks with Roderic Palau, NP in the [facility].  Dr Sallyanne Kuster recommends that you schedule a follow-up appointment first available.      [facility] [address] [place] [address] Dept: [phone] Loc: [phone]  SHRAVAN SALAHUDDIN                     05/24/2018  You are scheduled for a TEE Cardioversion on Friday, July 12th, 2019 with Dr Sallyanne Kuster. Please arrive at the [facility] (Main Entrance A) at [facility]: [address] [place], [place] [address] at 9:30 am.  DIET: Nothing to eat or drink after midnight except a sip of water with medications (see medication instructions below)  Medication Instructions: Hold Glimepiride  Continue your anticoagulant: Eliquis You will need to continue your anticoagulant after your procedure until you are told by your provider that it is safe to stop   Labs: Your lab work will be done at the hospital prior to your procedure - you will need to arrive 1  hours ahead of your procedure  You must have a responsible person to drive you home and stay in the waiting area during your procedure. Failure to do so could result in cancellation.  Bring your insurance cards.  *Special Note: Every effort is made to have your procedure done on time. Occasionally there are emergencies that occur at the hospital that may cause delays. Please be patient if a delay does occur.      Signed, Sanda Klein, MD  05/24/2018 7:27 PM    [place], [place], [place]  [address] Phone: [phone]; Fax: [phone]   EP Attending  Patient seen and examined. Agree with above. The patient is admitted for biv ppm insertion. He has chronic systolic heart failure and has LBBB. He also has PAF. I have discussed the  indications/risks/benefits/goals/expectations of the procedure and he wishes to proceed.  Mikle Bosworth.D.

## 2018-05-31 NOTE — Discharge Instructions (Signed)
° ° °  Supplemental Discharge Instructions for  Pacemaker/Defibrillator Patients  Activity No heavy lifting or vigorous activity with your left/right arm for 6 to 8 weeks.  Do not raise your left/right arm above your head for one week.  Gradually raise your affected arm as drawn below.             06/04/18                     06/05/18                    06/06/18                   06/07/18              __  NO DRIVING for  1 week   ; you may begin driving on 2/48/25 .  WOUND CARE - Keep the wound area clean and dry.  Do not get this area wet for one week. No showers for one week; you may shower on  06/07/18 . - The tape/steri-strips on your wound will fall off; do not pull them off.  No bandage is needed on the site.  DO  NOT apply any creams, oils, or ointments to the wound area. - If you notice any drainage or discharge from the wound, any swelling or bruising at the site, or you develop a fever > 101? F after you are discharged home, call the office at once.  Special Instructions - You are still able to use cellular telephones; use the ear opposite the side where you have your pacemaker/defibrillator.  Avoid carrying your cellular phone near your device. - When traveling through airports, show security personnel your identification card to avoid being screened in the metal detectors.  Ask the security personnel to use the hand wand. - Avoid arc welding equipment, MRI testing (magnetic resonance imaging), TENS units (transcutaneous nerve stimulators).  Call the office for questions about other devices. - Avoid electrical appliances that are in poor condition or are not properly grounded. - Microwave ovens are safe to be near or to operate.  Additional information for defibrillator patients should your device go off: - If your device goes off ONCE and you feel fine afterward, notify the device clinic nurses. - If your device goes off ONCE and you do not feel well afterward, call 911. - If your  device goes off TWICE, call 911. - If your device goes off THREE times in one day, call 911.  DO NOT DRIVE YOURSELF OR A FAMILY MEMBER WITH A DEFIBRILLATOR TO THE HOSPITAL--CALL 911.

## 2018-06-01 ENCOUNTER — Telehealth: Payer: Self-pay | Admitting: Pulmonary Disease

## 2018-06-01 ENCOUNTER — Ambulatory Visit (HOSPITAL_COMMUNITY): Payer: PPO

## 2018-06-01 DIAGNOSIS — Z950 Presence of cardiac pacemaker: Secondary | ICD-10-CM | POA: Diagnosis not present

## 2018-06-01 DIAGNOSIS — R911 Solitary pulmonary nodule: Secondary | ICD-10-CM

## 2018-06-01 DIAGNOSIS — N184 Chronic kidney disease, stage 4 (severe): Secondary | ICD-10-CM | POA: Diagnosis not present

## 2018-06-01 DIAGNOSIS — I428 Other cardiomyopathies: Secondary | ICD-10-CM | POA: Diagnosis not present

## 2018-06-01 DIAGNOSIS — D869 Sarcoidosis, unspecified: Secondary | ICD-10-CM | POA: Diagnosis not present

## 2018-06-01 DIAGNOSIS — I130 Hypertensive heart and chronic kidney disease with heart failure and stage 1 through stage 4 chronic kidney disease, or unspecified chronic kidney disease: Secondary | ICD-10-CM | POA: Diagnosis not present

## 2018-06-01 DIAGNOSIS — F0390 Unspecified dementia without behavioral disturbance: Secondary | ICD-10-CM | POA: Diagnosis not present

## 2018-06-01 DIAGNOSIS — I447 Left bundle-branch block, unspecified: Secondary | ICD-10-CM | POA: Diagnosis not present

## 2018-06-01 DIAGNOSIS — I5022 Chronic systolic (congestive) heart failure: Secondary | ICD-10-CM | POA: Diagnosis not present

## 2018-06-01 DIAGNOSIS — I481 Persistent atrial fibrillation: Secondary | ICD-10-CM | POA: Diagnosis not present

## 2018-06-01 DIAGNOSIS — G4700 Insomnia, unspecified: Secondary | ICD-10-CM | POA: Diagnosis not present

## 2018-06-01 DIAGNOSIS — E7800 Pure hypercholesterolemia, unspecified: Secondary | ICD-10-CM | POA: Diagnosis not present

## 2018-06-01 DIAGNOSIS — I5023 Acute on chronic systolic (congestive) heart failure: Secondary | ICD-10-CM | POA: Diagnosis not present

## 2018-06-01 DIAGNOSIS — R918 Other nonspecific abnormal finding of lung field: Secondary | ICD-10-CM

## 2018-06-01 DIAGNOSIS — I4430 Unspecified atrioventricular block: Secondary | ICD-10-CM | POA: Diagnosis not present

## 2018-06-01 DIAGNOSIS — E1122 Type 2 diabetes mellitus with diabetic chronic kidney disease: Secondary | ICD-10-CM | POA: Diagnosis not present

## 2018-06-01 MED ORDER — CARVEDILOL 3.125 MG PO TABS: 3.1250 mg | ORAL_TABLET | Freq: Two times a day (BID) | ORAL | 11 refills | Status: AC

## 2018-06-01 MED FILL — Gentamicin in Saline Inj 0.8 MG/ML: INTRAVENOUS | Qty: 100 | Status: AC

## 2018-06-01 NOTE — Progress Notes (Signed)
Pt has orders to be discharged. Discharge instructions given and pt has no additional questions at this time. Medication regimen reviewed and pt educated. Pt verbalized understanding and has no additional questions. Telemetry box removed. IV removed and site in good condition. Pt stable and waiting for transportation. 

## 2018-06-01 NOTE — Discharge Summary (Addendum)
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  Admit date: 05/31/2018 Discharge date: 06/01/2018  Primary Care Physician: Chipper Herb, MD  Primary Cardiologist: Dr. Sallyanne Kuster Electrophysiologist: new to Dr. Lovena Le  Primary Discharge Diagnosis:  1. CM     Chronic CHF (systolic) 2. LBBB  Secondary Discharge Diagnosis:  1. Persistent AFib     CHA2DS2Vasc is 5, on Eliquis 2. DM 3. HTN 4. bradycardia 5. CKD (IV) 6. Sarcoid     Pulm/mediatinal, known RUL mass by notes  No Known Allergies   Procedures This Admission:  1.  Implantation of a SJM CRT-PPM on 05/31/18 by Dr Lovena Le.  The patient received a  There were no immediate post procedure complications. 2.  CXR on 06/01/18 demonstrated no pneumothorax status post device implantation.   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  Past medical history includes baseline bradycardia, conduction system disease, CM, LBBB.   Risks, benefits, and alternatives to PPM implantation were reviewed with the patient who wished to proceed.   Hospital Course:  The patient was admitted and underwent implantation of a CRT-P with details as outlined above.  He was monitored on telemetry overnight which demonstrated SR/V pacing, and AV paced rhythm.  Left chest was without hematoma or ecchymosis.  The device was interrogated and found to be functioning normally.  CXR was obtained and demonstrated no pneumothorax status post device implantation.  Wound care, arm mobility, and restrictions were reviewed with the patient.  The patient feels well, denies any rest SOB, CXR without pleural effusion, no edema, does not appear grossly overloaded, no CP, he was examined and considered stable for discharge to home.   Will add low dose coreg to his regime for his CM,  his exam/CXR appears compensated at this time.  He is taking a few of his meds differently then written, he reports this has been at the instruction of his doctors, he will continue his home meds as he is without changes.  Discussed cxr with the patient, he will make Dr. Elsworth Soho aware of his CXR done today for any for review, though the patient reports when he saw him earlier this month, pulmonary-wise was felt to be very stable.   Physical Exam: Vitals:   06/01/18 0500 06/01/18 0548 06/01/18 0845 06/01/18 1116  BP: (!) 144/81 (!) 144/81 139/60 127/74  Pulse: 71 73 75 82  Resp:  19 18 17   Temp: 98.3 F (36.8 C) 98.3 F (36.8 C) 98.1 F (36.7 C) 98.2 F (36.8 C)  TempSrc: Oral Oral Oral Oral  SpO2: 94% 96% 96% 96%  Weight: 199 lb (90.3 kg)     Height:        GEN- The patient is well appearing, alert and oriented x 3 today.   HEENT: normocephalic, atraumatic; sclera clear, conjunctiva pink; hearing intact; oropharynx clear; neck supple, no JVP Lungs- CTA b/l, normal work of breathing.  No wheezes, rales, rhonchi Heart- RRR, no murmurs, rubs or gallops GI- soft, non-tender, non-distended Extremities- no clubbing, cyanosis, or edema MS- no significant deformity or atrophy Skin- warm and dry, no rash or lesion, left chest without hematoma/ecchymosis Psych- euthymic mood, full affect Neuro- no gross deficits   Labs:   Lab Results  Component Value Date   WBC 8.4 05/24/2018   HGB 10.4 (L) 05/24/2018  HCT 31.2 (L) 05/24/2018   MCV 89 05/24/2018   PLT 302 05/24/2018   No results for input(s): NA, K, CL, CO2, BUN, CREATININE, CALCIUM, PROT, BILITOT, ALKPHOS, ALT, AST, GLUCOSE in the last 168 hours.  Invalid input(s): LABALBU  Discharge Medications:  Allergies as of 06/01/2018   No Known Allergies     Medication List    TAKE these medications   acetaminophen 500 MG tablet Commonly known as:  TYLENOL Take 1,000 mg by mouth every 6 (six) hours as needed for moderate pain or  headache.   amiodarone 200 MG tablet Commonly known as:  PACERONE Take 1 tablet (200 mg total) by mouth daily.   apixaban 2.5 MG Tabs tablet Commonly known as:  ELIQUIS Take 1 tablet (2.5 mg total) by mouth 2 (two) times daily. Notes to patient:  Resume on Friday 06/04/18 morning dose   carvedilol 3.125 MG tablet Commonly known as:  COREG Take 1 tablet (3.125 mg total) by mouth 2 (two) times daily.   glimepiride 4 MG tablet Commonly known as:  AMARYL TAKE 1 TABLET DAILY What changed:    how much to take  how to take this  when to take this Notes to patient:  Continue to take as you have been instructed by the prescribing doctor   glucose blood test strip Commonly known as:  ONETOUCH VERIO Twice Daily   MACULAR HEALTH FORMULA PO Take 1 capsule by mouth daily.   potassium chloride SA 20 MEQ tablet Commonly known as:  K-DUR,KLOR-CON Take 40 mEq by mouth every morning.   predniSONE 10 MG tablet Commonly known as:  DELTASONE TAKE (1) TABLET DAILY WITH BREAKFAST. What changed:  See the new instructions. Notes to patient:  Continue as you have been instructed by the prescribing doctor   torsemide 20 MG tablet Commonly known as:  DEMADEX Take 1 tablet (20 mg total) by mouth daily as directed. What changed:    how much to take  how to take this  when to take this  additional instructions Notes to patient:  Continue to take this medicine as y ou have been instructed by the prescribing doctor   Vitamin D3 5000 units Caps Take 5,000 Units by mouth every evening.       Disposition:  Home  Discharge Instructions    Diet - low sodium heart healthy   Complete by:  As directed    Increase activity slowly   Complete by:  As directed      Follow-up Information    [facility] Follow up on 06/09/2018.   Specialty:  Cardiology Why:  1:30PM Contact information: [address] [id number] [place]       Sanda Klein, MD Follow up on 08/09/2018.   Specialty:  Cardiology Why:  3:40PM Contact information: [address] [place] [address] [phone]        [facility] Follow up on 06/10/2018.   Specialty:  Cardiology Why:  10:00AM, wound check visit Contact information: [address], [address] [place]       Evans Lance, MD Follow up on 09/08/2018.   Specialty:  Cardiology Why:  9:00AM Contact information: [address] [place] [address] [phone]           Duration of Discharge Encounter: Greater than 30 minutes including physician time.  Venetia Night, PA-C 06/01/2018 11:52 AM  EP Attending  Patient seen and examined. Agree with above. The patient is s/p Biv PPM and his device has been interogated under my direct supervision and is working normally. CXR looks good. He will be discharged home with the usual followup.   Mikle Bosworth.D.

## 2018-06-01 NOTE — Telephone Encounter (Signed)
Will route message to RA to call Dr. Lovena Le.

## 2018-06-01 NOTE — Consult Note (Signed)
   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 Met patient at bedside for information regarding [facility].  He accepted the brochure, 24 hour nurse advise line information.  Patient is s/p pacemaker implant. States no needs. For questions contact:   Natividad Brood, RN BSN [facility] Liaison  [phone] business mobile phone Toll free office [phone]

## 2018-06-02 NOTE — Telephone Encounter (Signed)
Attempted to contact pt. I did not receive an answer. There was no option for me to leave a message. Will try back.  

## 2018-06-02 NOTE — Telephone Encounter (Signed)
Have reviewed his chest x-ray with right upper lobe opacity looks slight worse. Please schedule follow-up CT chest without contrast for mid August. Will discuss with Dr. Lovena Le as needed

## 2018-06-02 NOTE — Telephone Encounter (Signed)
Attempted to call patient today regarding below message. I did not receive an answer at time of call. I have left a voicemail message for pt to return call. X1  pt requests RA to call Dr. Lovena Le in regards to a recent CXR. Dr. Tanna Furry number is [phone]  RA please advise

## 2018-06-03 NOTE — Telephone Encounter (Signed)
Attempted to call ptp. I did not receive an answer. I have left a message for pt to return our call.

## 2018-06-03 NOTE — Telephone Encounter (Signed)
Pt is returning call. Cb is [phone].

## 2018-06-03 NOTE — Telephone Encounter (Signed)
Called spoke with patient Advised of cxr results/recs as stated by RA below Pt okay with proceeding with CT Chest in mid-August Order placed Pt aware he will receive a call once results are available  Nothing further needed at this time; will sign off

## 2018-06-07 ENCOUNTER — Telehealth: Payer: Self-pay | Admitting: Family Medicine

## 2018-06-07 ENCOUNTER — Telehealth: Payer: Self-pay | Admitting: Cardiovascular Disease

## 2018-06-07 MED ORDER — POTASSIUM CHLORIDE CRYS ER 20 MEQ PO TBCR: 40.0000 meq | EXTENDED_RELEASE_TABLET | ORAL | 3 refills | Status: DC

## 2018-06-07 NOTE — Telephone Encounter (Signed)
New Message:        *STAT* If patient is at the pharmacy, call can be transferred to refill team.   1. Which medications need to be refilled? (please list name of each medication and dose if known) potassium chloride SA (K-DUR,KLOR-CON) 20 MEQ tablet  2. Which pharmacy/location (including street and city if local pharmacy) is medication to be sent to?[place], [place]  3. Do they need a 30 day or 90 day supply? 65    Pt states he will be out of this medication today.

## 2018-06-09 ENCOUNTER — Ambulatory Visit (HOSPITAL_COMMUNITY)
Admission: RE | Admit: 2018-06-09 | Discharge: 2018-06-09 | Disposition: A | Discharge status: Home / Self Care | Payer: PPO | Source: Ambulatory Visit | Attending: Nurse Practitioner | Admitting: Nurse Practitioner

## 2018-06-09 ENCOUNTER — Encounter (HOSPITAL_COMMUNITY): Payer: Self-pay | Admitting: Nurse Practitioner

## 2018-06-09 VITALS — BP 152/78 | HR 65 | Ht 72.0 in | Wt 199.0 lb

## 2018-06-09 DIAGNOSIS — Z950 Presence of cardiac pacemaker: Secondary | ICD-10-CM | POA: Insufficient documentation

## 2018-06-09 DIAGNOSIS — R9431 Abnormal electrocardiogram [ECG] [EKG]: Secondary | ICD-10-CM | POA: Insufficient documentation

## 2018-06-09 DIAGNOSIS — I447 Left bundle-branch block, unspecified: Secondary | ICD-10-CM | POA: Diagnosis not present

## 2018-06-09 DIAGNOSIS — E785 Hyperlipidemia, unspecified: Secondary | ICD-10-CM | POA: Insufficient documentation

## 2018-06-09 DIAGNOSIS — Z79899 Other long term (current) drug therapy: Secondary | ICD-10-CM | POA: Insufficient documentation

## 2018-06-09 DIAGNOSIS — I440 Atrioventricular block, first degree: Secondary | ICD-10-CM | POA: Insufficient documentation

## 2018-06-09 DIAGNOSIS — Z8249 Family history of ischemic heart disease and other diseases of the circulatory system: Secondary | ICD-10-CM | POA: Diagnosis not present

## 2018-06-09 DIAGNOSIS — I110 Hypertensive heart disease with heart failure: Secondary | ICD-10-CM | POA: Diagnosis not present

## 2018-06-09 DIAGNOSIS — Z7984 Long term (current) use of oral hypoglycemic drugs: Secondary | ICD-10-CM | POA: Diagnosis not present

## 2018-06-09 DIAGNOSIS — Z87891 Personal history of nicotine dependence: Secondary | ICD-10-CM | POA: Diagnosis not present

## 2018-06-09 DIAGNOSIS — Z7901 Long term (current) use of anticoagulants: Secondary | ICD-10-CM | POA: Diagnosis not present

## 2018-06-09 DIAGNOSIS — N289 Disorder of kidney and ureter, unspecified: Secondary | ICD-10-CM | POA: Diagnosis not present

## 2018-06-09 DIAGNOSIS — I4891 Unspecified atrial fibrillation: Secondary | ICD-10-CM | POA: Diagnosis not present

## 2018-06-09 DIAGNOSIS — I509 Heart failure, unspecified: Secondary | ICD-10-CM | POA: Insufficient documentation

## 2018-06-09 DIAGNOSIS — Z7952 Long term (current) use of systemic steroids: Secondary | ICD-10-CM | POA: Diagnosis not present

## 2018-06-09 DIAGNOSIS — E119 Type 2 diabetes mellitus without complications: Secondary | ICD-10-CM | POA: Diagnosis not present

## 2018-06-10 ENCOUNTER — Ambulatory Visit (INDEPENDENT_AMBULATORY_CARE_PROVIDER_SITE_OTHER): Payer: PPO | Admitting: *Deleted

## 2018-06-10 DIAGNOSIS — R001 Bradycardia, unspecified: Secondary | ICD-10-CM | POA: Diagnosis not present

## 2018-06-10 DIAGNOSIS — I447 Left bundle-branch block, unspecified: Secondary | ICD-10-CM | POA: Diagnosis not present

## 2018-06-10 DIAGNOSIS — Z950 Presence of cardiac pacemaker: Secondary | ICD-10-CM

## 2018-06-10 DIAGNOSIS — I5022 Chronic systolic (congestive) heart failure: Secondary | ICD-10-CM

## 2018-06-10 LAB — CUP PACEART INCLINIC DEVICE CHECK
Battery Voltage: 3.01 V
Date Time Interrogation Session: 20190725121211
Implantable Lead Implant Date: 20190715
Implantable Lead Implant Date: 20190715
Implantable Lead Location: 753858
Implantable Lead Location: 753859
Implantable Lead Model: 4398
Implantable Pulse Generator Implant Date: 20190715
Lead Channel Impedance Value: 400 Ohm
Lead Channel Impedance Value: 462.5 Ohm
Lead Channel Pacing Threshold Amplitude: 0.5 V
Lead Channel Pacing Threshold Amplitude: 1.25 V
Lead Channel Pacing Threshold Pulse Width: 0.5 ms
Lead Channel Pacing Threshold Pulse Width: 0.5 ms
Lead Channel Setting Pacing Amplitude: 3.5 V
Lead Channel Setting Pacing Amplitude: 3.5 V
Lead Channel Setting Pacing Pulse Width: 0.5 ms
Lead Channel Setting Sensing Sensitivity: 2 mV
MDC IDC LEAD IMPLANT DT: 20190715
MDC IDC LEAD LOCATION: 753860
MDC IDC MSMT BATTERY REMAINING LONGEVITY: 45 mo
MDC IDC MSMT LEADCHNL RA PACING THRESHOLD AMPLITUDE: 0.75 V
MDC IDC MSMT LEADCHNL RA PACING THRESHOLD PULSEWIDTH: 0.5 ms
MDC IDC MSMT LEADCHNL RA SENSING INTR AMPL: 2.8 mV
MDC IDC MSMT LEADCHNL RV IMPEDANCE VALUE: 575 Ohm
MDC IDC MSMT LEADCHNL RV SENSING INTR AMPL: 12 mV
MDC IDC SET LEADCHNL RA PACING AMPLITUDE: 3.5 V
MDC IDC SET LEADCHNL RV PACING PULSEWIDTH: 0.5 ms
MDC IDC STAT BRADY RA PERCENT PACED: 51 %
Pulse Gen Serial Number: 9460801

## 2018-06-10 NOTE — Addendum Note (Signed)
Encounter addended by: Sherran Needs, NP on: 06/10/2018 1:28 PM  Actions taken: Sign clinical note

## 2018-06-10 NOTE — Progress Notes (Addendum)
Primary Care Physician: Chipper Herb, MD Referring Physician:Dr. 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 Lovena Le, that was stared on amiodarone 200 mg daily for afib by Dr.Croitoru, around 2 weeks ago. He reports that he is doing well. Steri strips intact over pacer site. Device interrogated today and per [facility] with [facility], he has some nonsustained a tach, but no  afib/flutter seen. Being paced 90-95% of the time. Is tolerating amiodarone.   Today, he denies symptoms of palpitations, chest pain, shortness of breath, orthopnea, PND, lower extremity edema, dizziness, presyncope, syncope, or neurologic sequela. + chronic foot edema.The patient is tolerating medications without difficulties and is otherwise without complaint today.   Past Medical History:  Diagnosis Date  . Cancer ([place] [facility])    lung  . Diabetes mellitus without complication ([place])   . Hyperlipidemia   . Hypertension   . Irregular heart rate    Dr. Victorino December note from 04/2017 states bradycardia, LBBB  . Pneumonia   . Renal insufficiency    Past Surgical History:  Procedure Laterality Date  . BIV PACEMAKER INSERTION CRT-P N/A 05/31/2018   Procedure: BIV PACEMAKER INSERTION CRT-P;  Surgeon: Evans Lance, MD;  Location: [facility];  Service: Cardiovascular;  Laterality: N/A;  . cyst removed    . ENDOBRONCHIAL ULTRASOUND Bilateral 03/23/2017   Procedure: ENDOBRONCHIAL ULTRASOUND;  Surgeon: Rigoberto Noel, MD;  Location: [facility];  Service: Cardiopulmonary;  Laterality: Bilateral;  . EYE SURGERY    . MEDIASTINOSCOPY N/A 04/08/2017   Procedure: MEDIASTINOSCOPY;  Surgeon: Grace Isaac, MD;  Location: [facility];  Service: Thoracic;  Laterality: N/A;  . MOUTH SURGERY    . ROTATOR CUFF REPAIR    . TEE WITHOUT CARDIOVERSION N/A 05/28/2018   Procedure: TRANSESOPHAGEAL ECHOCARDIOGRAM (TEE);  Surgeon: Sanda Klein, MD;  Location: [facility];  Service: Cardiovascular;  Laterality: N/A;  . tumor remoced from neck    . VIDEO BRONCHOSCOPY WITH ENDOBRONCHIAL ULTRASOUND N/A 04/08/2017   Procedure: VIDEO BRONCHOSCOPY WITH ENDOBRONCHIAL ULTRASOUND;  Surgeon: Grace Isaac, MD;  Location: [facility];  Service: Thoracic;  Laterality: N/A;  . VIDEO BRONCHOSCOPY WITH ENDOBRONCHIAL ULTRASOUND N/A 12/08/2017   Procedure: VIDEO BRONCHOSCOPY with BRONCHIAL BIOPSIES AND ENDOBRONCHIAL ULTRASOUND WITH TRANSBRONCHIAL AND BRONCHIAL BIOPSIES;  Surgeon: Grace Isaac, MD;  Location: [facility];  Service: Thoracic;  Laterality: N/A;    Current Outpatient Medications  Medication Sig Dispense Refill  . acetaminophen (TYLENOL) 500 MG tablet Take 1,000 mg by mouth every 6 (six) hours as needed for moderate pain or headache.    Marland Kitchen amiodarone (PACERONE) 200 MG tablet Take 1 tablet (200 mg total) by mouth daily. 30 tablet 3  . apixaban (ELIQUIS) 2.5 MG TABS tablet Take 1 tablet (2.5 mg total) by mouth 2 (two) times daily. 60 tablet 2  . carvedilol (COREG) 3.125 MG tablet Take 1 tablet (3.125 mg total) by mouth 2 (two) times daily. 60 tablet 11  . Cholecalciferol (VITAMIN D3) 5000 units CAPS Take 5,000 Units by mouth every evening.     Marland Kitchen glimepiride (AMARYL) 4 MG tablet TAKE 1 TABLET DAILY (Patient taking differently: Take 4 mg by mouth daily in the morning) 30 tablet 3  . glucose blood (ONETOUCH VERIO) test strip Twice Daily 100 each 12  . Multiple Vitamins-Minerals (MACULAR HEALTH FORMULA PO) Take 1 capsule by mouth  daily.    . potassium chloride SA (K-DUR,KLOR-CON) 20 MEQ tablet Take 2 tablets (40 mEq total) by mouth every morning. 90 tablet 3  . predniSONE (DELTASONE) 10 MG tablet TAKE (1) TABLET DAILY WITH BREAKFAST. (Patient taking differently: TAKE 5 MG BY MOUTH DAILY WITH BREAKFAST.) 90 tablet 0  . torsemide (DEMADEX) 20 MG tablet Take 1 tablet (20 mg total) by mouth daily as directed. (Patient taking differently: Take 40 mg  by mouth every morning. ) 90 tablet 3   No current facility-administered medications for this encounter.     No Known Allergies  Social History   Socioeconomic History  . Marital status: Married    Spouse name: Not on file  . Number of children: 1  . Years of education: Not on file  . Highest education level: Not on file  Occupational History  . Occupation: Retired Advice worker  . Financial resource strain: Not on file  . Food insecurity:    Worry: Not on file    Inability: Not on file  . Transportation needs:    Medical: Not on file    Non-medical: Not on file  Tobacco Use  . Smoking status: Former Smoker    Packs/day: 0.10    Years: 53.00    Pack years: 5.30    Types: Cigarettes    Last attempt to quit: 11/28/2016    Years since quitting: 1.5  . Smokeless tobacco: Never Used  Substance and Sexual Activity  . Alcohol use: No    Alcohol/week: 0.0 oz  . Drug use: No  . Sexual activity: Never  Lifestyle  . Physical activity:    Days per week: Not on file    Minutes per session: Not on file  . Stress: Not on file  Relationships  . Social connections:    Talks on phone: Not on file    Gets together: Not on file    Attends religious service: Not on file    Active member of club or organization: Not on file    Attends meetings of clubs or organizations: Not on file    Relationship status: Not on file  . Intimate partner violence:    Fear of current or ex partner: Not on file    Emotionally abused: Not on file    Physically abused: Not on file    Forced sexual activity: Not on file  Other Topics Concern  . Not on file  Social History Narrative  . Not on file    Family History  Problem Relation Age of Onset  . Kidney disease Mother   . Heart disease Father   . Cancer Son        prostate  . Suicidality Son     ROS- All systems are reviewed and negative except as per the HPI above  Physical Exam: Vitals:   06/09/18 1332  BP: (!) 152/78    Pulse: 65  Weight: 199 lb (90.3 kg)  Height: 6' (1.829 m)   Wt Readings from Last 3 Encounters:  06/09/18 199 lb (90.3 kg)  06/01/18 199 lb (90.3 kg)  05/24/18 208 lb 9.6 oz (94.6 kg)    Labs: Lab Results  Component Value Date   NA 141 05/24/2018   K 3.9 05/24/2018   CL 101 05/24/2018   CO2 22 05/24/2018   GLUCOSE 244 (H) 05/24/2018   BUN 34 (H) 05/24/2018   CREATININE 2.00 (H) 05/24/2018   CALCIUM 8.7 05/24/2018   PHOS 3.5 01/16/2016  Lab Results  Component Value Date   INR 1.09 01/07/2018   Lab Results  Component Value Date   CHOL 137 07/23/2017   HDL 36 (L) 07/23/2017   LDLCALC 82 07/23/2017   TRIG 93 07/23/2017     GEN- The patient is well appearing, alert and oriented x 3 today.   Head- normocephalic, atraumatic Eyes-  Sclera clear, conjunctiva pink Ears- hearing intact Oropharynx- clear Neck- supple, no JVP Lymph- no cervical lymphadenopathy Lungs- Clear to ausculation bilaterally, normal work of breathing Heart- Regular rate and rhythm, no murmurs, rubs or gallops, PMI not laterally displaced, PPm site wtin intact steri strips and no signs infection0 GI- soft, NT, ND, + BS Extremities- no clubbing, cyanosis, or + edema bilaterally, ankle/feet area MS- no significant deformity or atrophy Skin- no rash or lesion Psych- euthymic mood, full affect Neuro- strength and sensation are intact  EKG-v paced rhythm at 65 bpm, qrs int 142 ms, qtc 480 ms Epic records reviewed Device interrogated per [employer], [facility], and no afib/ flutter noted, some non sustained atrial tach, paced 90-95% of the time. interrogation faxed to device clinic       Assessment and Plan: 1. LBBB/CHF Pt received a BiV PPM/ICD 7/15 Site with steri strips intact  2. Afib On amiodarone 200 mg daily No evidence of afib or flutter today  3. CHF Chronic foot edema, but wt stable Continue carvedilol Continue torsemide as needed  F/u with device clinic tomorrow for wound  check, Dr. Loletha Grayer. 9/23, Dr. Lovena Le 10/23    Butch Penny C. Wyndell Cardiff, [facility] [address] [place], [place] [address] [phone]

## 2018-06-10 NOTE — Progress Notes (Signed)
Wound check appointment. Steri-strips removed. Wound without redness or edema. Incision edges approximated, wound well healed. Normal device function. Thresholds, sensing, and impedances consistent with implant measurements. Device programmed at 3.5V for extra safety margin until 3 month visit. Histogram distribution appropriate for patient and level of activity. 7 AMS episodes (<1%), EGMs appear possible conducted AT with long PR interval, but cannot fully exclude ventricular arrhythmia or other SVT due to low P-wave amplitude, longest 29min 50sec; patient asymptomatic with episodes, denies dizziness or syncope. Patient educated about wound care, arm mobility, lifting restrictions, and Merlin monitor. ROV with GT on 09/08/18.

## 2018-06-21 ENCOUNTER — Other Ambulatory Visit: Payer: PPO

## 2018-06-21 DIAGNOSIS — I10 Essential (primary) hypertension: Secondary | ICD-10-CM

## 2018-06-21 DIAGNOSIS — E785 Hyperlipidemia, unspecified: Secondary | ICD-10-CM

## 2018-06-21 DIAGNOSIS — D631 Anemia in chronic kidney disease: Secondary | ICD-10-CM

## 2018-06-21 DIAGNOSIS — N184 Chronic kidney disease, stage 4 (severe): Secondary | ICD-10-CM | POA: Diagnosis not present

## 2018-06-21 DIAGNOSIS — E559 Vitamin D deficiency, unspecified: Secondary | ICD-10-CM

## 2018-06-21 DIAGNOSIS — N183 Chronic kidney disease, stage 3 unspecified: Secondary | ICD-10-CM

## 2018-06-21 DIAGNOSIS — E1122 Type 2 diabetes mellitus with diabetic chronic kidney disease: Secondary | ICD-10-CM | POA: Diagnosis not present

## 2018-06-21 LAB — BAYER DCA HB A1C WAIVED: HB A1C: 6 % (ref <7.0)

## 2018-06-22 LAB — CBC WITH DIFFERENTIAL/PLATELET
BASOS ABS: 0 10*3/uL (ref 0.0–0.2)
Basos: 1 %
EOS (ABSOLUTE): 0.2 10*3/uL (ref 0.0–0.4)
Eos: 2 %
Hematocrit: 31.3 % — ABNORMAL LOW (ref 37.5–51.0)
Hemoglobin: 10.4 g/dL — ABNORMAL LOW (ref 13.0–17.7)
Immature Grans (Abs): 0.1 10*3/uL (ref 0.0–0.1)
Immature Granulocytes: 1 %
LYMPHS ABS: 0.4 10*3/uL — AB (ref 0.7–3.1)
Lymphs: 6 %
MCH: 29.8 pg (ref 26.6–33.0)
MCHC: 33.2 g/dL (ref 31.5–35.7)
MCV: 90 fL (ref 79–97)
Monocytes Absolute: 0.3 10*3/uL (ref 0.1–0.9)
Monocytes: 4 %
NEUTROS ABS: 6.8 10*3/uL (ref 1.4–7.0)
Neutrophils: 86 %
PLATELETS: 203 10*3/uL (ref 150–450)
RBC: 3.49 x10E6/uL — ABNORMAL LOW (ref 4.14–5.80)
RDW: 15.4 % (ref 12.3–15.4)
WBC: 7.9 10*3/uL (ref 3.4–10.8)

## 2018-06-22 LAB — BMP8+EGFR
BUN / CREAT RATIO: 14 (ref 10–24)
BUN: 31 mg/dL — ABNORMAL HIGH (ref 8–27)
CALCIUM: 8.9 mg/dL (ref 8.6–10.2)
CHLORIDE: 101 mmol/L (ref 96–106)
CO2: 23 mmol/L (ref 20–29)
Creatinine, Ser: 2.17 mg/dL — ABNORMAL HIGH (ref 0.76–1.27)
GFR calc non Af Amer: 27 mL/min/{1.73_m2} — ABNORMAL LOW (ref >59)
GFR, EST AFRICAN AMERICAN: 31 mL/min/{1.73_m2} — AB (ref >59)
Glucose: 257 mg/dL — ABNORMAL HIGH (ref 65–99)
POTASSIUM: 4.6 mmol/L (ref 3.5–5.2)
Sodium: 141 mmol/L (ref 134–144)

## 2018-06-22 LAB — HEPATIC FUNCTION PANEL
ALBUMIN: 3.5 g/dL (ref 3.5–4.7)
ALK PHOS: 65 IU/L (ref 39–117)
ALT: 10 IU/L (ref 0–44)
AST: 14 IU/L (ref 0–40)
BILIRUBIN, DIRECT: 0.12 mg/dL (ref 0.00–0.40)
Bilirubin Total: 0.3 mg/dL (ref 0.0–1.2)
TOTAL PROTEIN: 5.7 g/dL — AB (ref 6.0–8.5)

## 2018-06-22 LAB — LIPID PANEL
CHOLESTEROL TOTAL: 143 mg/dL (ref 100–199)
Chol/HDL Ratio: 3.3 ratio (ref 0.0–5.0)
HDL: 44 mg/dL (ref >39)
LDL Calculated: 79 mg/dL (ref 0–99)
Triglycerides: 100 mg/dL (ref 0–149)
VLDL Cholesterol Cal: 20 mg/dL (ref 5–40)

## 2018-06-22 LAB — VITAMIN D 25 HYDROXY (VIT D DEFICIENCY, FRACTURES): Vit D, 25-Hydroxy: 45.3 ng/mL (ref 30.0–100.0)

## 2018-06-22 NOTE — Addendum Note (Signed)
Encounter addended by: Sherran Needs, NP on: 06/22/2018 11:16 AM  Actions taken: LOS modified

## 2018-07-02 ENCOUNTER — Ambulatory Visit (HOSPITAL_COMMUNITY)
Admission: RE | Admit: 2018-07-02 | Discharge: 2018-07-02 | Disposition: A | Discharge status: Home / Self Care | Payer: PPO | Source: Ambulatory Visit | Attending: Pulmonary Disease | Admitting: Pulmonary Disease

## 2018-07-02 DIAGNOSIS — I313 Pericardial effusion (noninflammatory): Secondary | ICD-10-CM | POA: Diagnosis not present

## 2018-07-02 DIAGNOSIS — R911 Solitary pulmonary nodule: Secondary | ICD-10-CM | POA: Diagnosis not present

## 2018-07-02 DIAGNOSIS — I700 Atherosclerosis of aorta: Secondary | ICD-10-CM | POA: Insufficient documentation

## 2018-07-02 DIAGNOSIS — R918 Other nonspecific abnormal finding of lung field: Secondary | ICD-10-CM

## 2018-07-13 ENCOUNTER — Ambulatory Visit (INDEPENDENT_AMBULATORY_CARE_PROVIDER_SITE_OTHER): Payer: PPO | Admitting: Family Medicine

## 2018-07-13 ENCOUNTER — Encounter: Payer: Self-pay | Admitting: Family Medicine

## 2018-07-13 VITALS — BP 158/76 | HR 60 | Temp 97.6°F | Ht 72.0 in | Wt 210.0 lb

## 2018-07-13 DIAGNOSIS — E1122 Type 2 diabetes mellitus with diabetic chronic kidney disease: Secondary | ICD-10-CM

## 2018-07-13 DIAGNOSIS — R931 Abnormal findings on diagnostic imaging of heart and coronary circulation: Secondary | ICD-10-CM

## 2018-07-13 DIAGNOSIS — R531 Weakness: Secondary | ICD-10-CM | POA: Diagnosis not present

## 2018-07-13 DIAGNOSIS — N183 Chronic kidney disease, stage 3 unspecified: Secondary | ICD-10-CM

## 2018-07-13 DIAGNOSIS — N400 Enlarged prostate without lower urinary tract symptoms: Secondary | ICD-10-CM | POA: Diagnosis not present

## 2018-07-13 DIAGNOSIS — E119 Type 2 diabetes mellitus without complications: Secondary | ICD-10-CM | POA: Diagnosis not present

## 2018-07-13 DIAGNOSIS — E559 Vitamin D deficiency, unspecified: Secondary | ICD-10-CM | POA: Diagnosis not present

## 2018-07-13 DIAGNOSIS — R710 Precipitous drop in hematocrit: Secondary | ICD-10-CM

## 2018-07-13 DIAGNOSIS — I739 Peripheral vascular disease, unspecified: Secondary | ICD-10-CM | POA: Diagnosis not present

## 2018-07-13 DIAGNOSIS — I10 Essential (primary) hypertension: Secondary | ICD-10-CM

## 2018-07-13 LAB — URINALYSIS, COMPLETE
Bilirubin, UA: NEGATIVE
KETONES UA: NEGATIVE
Leukocytes, UA: NEGATIVE
Nitrite, UA: NEGATIVE
SPEC GRAV UA: 1.025 (ref 1.005–1.030)
Urobilinogen, Ur: 0.2 mg/dL (ref 0.2–1.0)
pH, UA: 6 (ref 5.0–7.5)

## 2018-07-13 LAB — MICROSCOPIC EXAMINATION
BACTERIA UA: NONE SEEN
EPITHELIAL CELLS (NON RENAL): NONE SEEN /HPF (ref 0–10)
Renal Epithel, UA: NONE SEEN /hpf
WBC, UA: NONE SEEN /hpf (ref 0–5)

## 2018-07-13 NOTE — Patient Instructions (Addendum)
Medicare Annual Wellness Visit  [facility] and the medical providers at [facility] strive to bring you the best medical care.  In doing so we not only want to address your current medical conditions and concerns but also to detect new conditions early and prevent illness, disease and health-related problems.    Medicare offers a yearly Wellness Visit which allows our clinical staff to assess your need for preventative services including immunizations, lifestyle education, counseling to decrease risk of preventable diseases and screening for fall risk and other medical concerns.    This visit is provided free of charge (no copay) for all Medicare recipients. The clinical pharmacists at [facility] have begun to conduct these Wellness Visits which will also include a thorough review of all your medications.    As you primary medical provider recommend that you make an appointment for your Annual Wellness Visit if you have not done so already this year.  You may set up this appointment before you leave today or you may call back ([phone]) and schedule an appointment.  Please make sure when you call that you mention that you are scheduling your Annual Wellness Visit with the clinical pharmacist so that the appointment may be made for the proper length of time.     Continue current medications. Continue good therapeutic lifestyle changes which include good diet and exercise. Fall precautions discussed with patient. If an FOBT was given today- please return it to our front desk. If you are over 3 years old - you may need Prevnar 18 or the adult Pneumonia vaccine.  **Flu shots are available--- please call and schedule a FLU-CLINIC appointment**  After your visit with [place] today you will receive a survey in the mail or online from [employer] regarding your care with [place]. Please take a moment to fill this out. Your feedback is very  important to [place] as you can help [place] better understand your patient needs as well as improve your experience and satisfaction. WE CARE ABOUT YOU!!!   The patient will continue to follow-up with his cardiologist his nephrologist and his pulmonologist and any future CT scans will be ordered by the pulmonologist.  He will be given a copy of the most recent CT report. Continue vitamin D replacement pending directions from nephrology Follow-up with audiology as planned

## 2018-07-13 NOTE — Progress Notes (Signed)
Subjective:    Patient ID: Ruben Conner, DDS, male    DOB: 1930-09-19, 84 y.o.   MRN: [id number]  HPI Pt here for follow up and management of chronic medical problems which includes hypertension and diabetes. He is taking medication regularly.  The patient has had a lot of medical issues over the past year.  He has had persistent atrial fibrillation bradycardia and subsequently a pacemaker.  He is been diagnosed with what was thought to be lung cancer but no diagnoses were ever made with tissue because all tissue samples were negative for cancer and subsequently a diagnosis of sarcoidosis was made.  He is being followed by the cardiologist regularly.  Today as usual he has no complaints.  He also has chronic kidney disease and is followed by Dr. Joelyn Oms.  The oncologist no longer sees him.  He is being followed by Dr. Elsworth Soho for the sarcoidosis and continues to be followed by Dr. Lovena Le and Croitoru for his heart.  He has diabetes.  He needs a rectal exam today.  He has had lab work done and we will review this with him during his visit today.  The recent CT of the chest as of 816 showed a grossly unchanged low right paratracheal mass.  There was an increase in the sharply marginated consolidation within the right lung which was thought to be evolving postradiation changes.  There was an interval increase in the size of the subpleural right lower lobe nodule.  We will leave the follow-up of this to the pulmonologist.  We will give the patient a copy of this report.  The patient's blood work was good as far as his cholesterol was concern his LDL-C was good at 79 triglycerides good at 100 and total cholesterol is good at 143.  A1c was good at 6.0 even though the blood sugar was elevated at 257 and his creatinine was elevated at 2.17.  All of the electrolytes including potassium were good.  The CBC has a hemoglobin which has remained low at about 10.4 and a white blood cell count that is normal at 7.7 with an  adequate platelet count.  The vitamin D continues to run low.  We will leave any vitamin D treatment up to his nephrologist.  All of the liver function tests were within normal limits except the total protein was slightly decreased at 5.7.  All of this lab work was reviewed with the patient during the visit today.     Patient Active Problem List   Diagnosis Date Noted  . Chronic systolic heart failure ([place]) 05/31/2018  . Acute systolic heart failure ([place]) 05/24/2018  . Persistent atrial fibrillation ([place]) 05/24/2018  . Sarcoidosis 04/06/2018  . CKD (chronic kidney disease) stage 4, GFR 15-29 ml/min (HCC) 04/06/2018  . Edema 02/23/2018  . Mediastinal lymphadenopathy 03/10/2017  . Anemia in stage 4 chronic kidney disease ([place]) 09/18/2016  . Vitamin D deficiency 09/18/2016  . Sinus bradycardia 08/31/2014  . First degree AV block 08/31/2014  . LBBB (left bundle branch block) 08/31/2014  . Essential hypertension 09/20/2013  . Type 2 diabetes mellitus with stage 3 chronic kidney disease, without long-term current use of insulin ([place]) 05/02/2013  . Hypercholesterolemia 05/02/2013   Outpatient Encounter Medications as of 07/13/2018  Medication Sig  . acetaminophen (TYLENOL) 500 MG tablet Take 1,000 mg by mouth every 6 (six) hours as needed for moderate pain or headache.  Marland Kitchen amiodarone (PACERONE) 200 MG tablet Take 1 tablet (200 mg total) by  mouth daily.  Marland Kitchen apixaban (ELIQUIS) 2.5 MG TABS tablet Take 1 tablet (2.5 mg total) by mouth 2 (two) times daily.  . carvedilol (COREG) 3.125 MG tablet Take 1 tablet (3.125 mg total) by mouth 2 (two) times daily.  . Cholecalciferol (VITAMIN D3) 5000 units CAPS Take 5,000 Units by mouth every evening.   Marland Kitchen glimepiride (AMARYL) 4 MG tablet TAKE 1 TABLET DAILY (Patient taking differently: Take 4 mg by mouth daily in the morning)  . glucose blood (ONETOUCH VERIO) test strip Twice Daily  . Multiple Vitamins-Minerals (MACULAR HEALTH FORMULA PO) Take 1 capsule by  mouth daily.  . potassium chloride SA (K-DUR,KLOR-CON) 20 MEQ tablet Take 2 tablets (40 mEq total) by mouth every morning.  . predniSONE (DELTASONE) 10 MG tablet TAKE (1) TABLET DAILY WITH BREAKFAST. (Patient taking differently: TAKE 5 MG BY MOUTH DAILY WITH BREAKFAST.)  . torsemide (DEMADEX) 20 MG tablet Take 1 tablet (20 mg total) by mouth daily as directed. (Patient taking differently: Take 40 mg by mouth every morning. )   No facility-administered encounter medications on file as of 07/13/2018.      Review of Systems  Constitutional: Negative.   HENT: Negative.   Eyes: Negative.   Respiratory: Negative.   Cardiovascular: Negative.   Gastrointestinal: Negative.   Endocrine: Negative.   Genitourinary: Negative.   Musculoskeletal: Negative.   Skin: Negative.   Allergic/Immunologic: Negative.   Neurological: Negative.   Hematological: Negative.   Psychiatric/Behavioral: Negative.        Objective:   Physical Exam  Constitutional: He is oriented to person, place, and time. He appears well-developed and well-nourished. No distress.  The patient is pleasant and relaxed and continues to try to maintain his good health as possible to take care of his wife  HENT:  Head: Normocephalic and atraumatic.  Right Ear: External ear normal.  Left Ear: External ear normal.  Nose: Nose normal.  Mouth/Throat: Oropharynx is clear and moist. No oropharyngeal exudate.  Eyes: Pupils are equal, round, and reactive to light. Conjunctivae and EOM are normal. Right eye exhibits no discharge. Left eye exhibits no discharge. No scleral icterus.  Neck: Normal range of motion. Neck supple. No thyromegaly present.  No bruits thyromegaly or anterior cervical adenopathy  Cardiovascular: Normal rate, regular rhythm and normal heart sounds.  No murmur heard. The heart is regular at 60/min there is pedal pulses left worse than the right.  There is pedal edema bilaterally no pretibial edema of significance.    Pulmonary/Chest: Effort normal and breath sounds normal. He has no wheezes. He has no rales. He exhibits no tenderness.  Minimal chest congestion which cleared with coughing.  No axillary adenopathy  Abdominal: Soft. Bowel sounds are normal. He exhibits no mass. There is no tenderness. There is no rebound and no guarding.  No liver or spleen enlargement no epigastric tenderness no inguinal adenopathy and no bruit  Genitourinary:  Genitourinary Comments: Will do rectal exam at next visit.  Musculoskeletal: Normal range of motion. He exhibits edema.  Lymphadenopathy:    He has no cervical adenopathy.  Neurological: He is alert and oriented to person, place, and time. He has normal reflexes. No cranial nerve deficit.  Skin: Skin is warm and dry. No rash noted.  Psychiatric: He has a normal mood and affect. His behavior is normal. Judgment and thought content normal.  Patient's mood and affect and behavior are normal for him.  Nursing note and vitals reviewed.   BP (!) 158/76   Pulse 60  Temp 97.6 F (36.4 C) (Oral)   Ht 6' (1.829 m)   Wt 210 lb (95.3 kg)   BMI 28.48 kg/m        Assessment & Plan:  1. Type 2 diabetes mellitus with stage 4 chronic kidney disease, without long-term current use of insulin (HCC) -T need to watch diet closely and try to get more exercise - Microalbumin / creatinine urine ratio  2. Vitamin D deficiency -Continue vitamin D replacement pending directions from nephrology  3. Essential hypertension -The blood pressure is elevated today on 2 occasions.  The patient does take his Demadex but not every day.  We will let the cardiologist and nephrologist make decisions about how much of the Demadex he should be taking and when.  4. Benign prostatic hyperplasia, unspecified whether lower urinary tract symptoms present - Urinalysis, Complete  5. Decreased hemoglobin -This is stable and most likely related to chronic kidney disease.  6. Decreased cardiac  ejection fraction -Continue to follow-up with cardiology  7. Weakness -This is most likely multifactorial secondary to medicines chronic kidney disease with anemia and his heart condition and lung condition.  8. Type 2 diabetes mellitus without complication, without long-term current use of insulin ([place]) -Continue with current treatment and as aggressive therapeutic lifestyle changes as possible  9. Peripheral vascular insufficiency (HCC) -Walk is much as possible to improve circulation  Patient Instructions                       Medicare Annual Wellness Visit  [facility] and the medical providers at [facility] strive to bring you the best medical care.  In doing so we not only want to address your current medical conditions and concerns but also to detect new conditions early and prevent illness, disease and health-related problems.    Medicare offers a yearly Wellness Visit which allows our clinical staff to assess your need for preventative services including immunizations, lifestyle education, counseling to decrease risk of preventable diseases and screening for fall risk and other medical concerns.    This visit is provided free of charge (no copay) for all Medicare recipients. The clinical pharmacists at [facility] have begun to conduct these Wellness Visits which will also include a thorough review of all your medications.    As you primary medical provider recommend that you make an appointment for your Annual Wellness Visit if you have not done so already this year.  You may set up this appointment before you leave today or you may call back ([phone]) and schedule an appointment.  Please make sure when you call that you mention that you are scheduling your Annual Wellness Visit with the clinical pharmacist so that the appointment may be made for the proper length of time.     Continue current medications. Continue good therapeutic  lifestyle changes which include good diet and exercise. Fall precautions discussed with patient. If an FOBT was given today- please return it to our front desk. If you are over 52 years old - you may need Prevnar 48 or the adult Pneumonia vaccine.  **Flu shots are available--- please call and schedule a FLU-CLINIC appointment**  After your visit with [place] today you will receive a survey in the mail or online from [employer] regarding your care with [place]. Please take a moment to fill this out. Your feedback is very important to [place] as you can help [place] better understand your patient needs as  well as improve your experience and satisfaction. WE CARE ABOUT YOU!!!   The patient will continue to follow-up with his cardiologist his nephrologist and his pulmonologist and any future CT scans will be ordered by the pulmonologist.  He will be given a copy of the most recent CT report. Continue vitamin D replacement pending directions from nephrology Follow-up with audiology as planned  Arrie Senate MD

## 2018-07-15 ENCOUNTER — Other Ambulatory Visit: Payer: Self-pay

## 2018-07-15 DIAGNOSIS — R8290 Unspecified abnormal findings in urine: Secondary | ICD-10-CM

## 2018-07-15 LAB — MICROALBUMIN / CREATININE URINE RATIO
CREATININE, UR: 52.5 mg/dL
Microalb/Creat Ratio: 2439 mg/g creat — ABNORMAL HIGH (ref 0.0–30.0)
Microalbumin, Urine: 1280.5 ug/mL

## 2018-08-04 ENCOUNTER — Ambulatory Visit (INDEPENDENT_AMBULATORY_CARE_PROVIDER_SITE_OTHER): Payer: PPO | Admitting: Pulmonary Disease

## 2018-08-04 ENCOUNTER — Encounter: Payer: Self-pay | Admitting: Pulmonary Disease

## 2018-08-04 DIAGNOSIS — D869 Sarcoidosis, unspecified: Secondary | ICD-10-CM | POA: Diagnosis not present

## 2018-08-04 DIAGNOSIS — I5022 Chronic systolic (congestive) heart failure: Secondary | ICD-10-CM

## 2018-08-04 NOTE — Assessment & Plan Note (Signed)
Torsemide has been increased to twice daily due to edema

## 2018-08-04 NOTE — Progress Notes (Signed)
   Subjective:    Patient ID: Ruben Conner, DDS, male    DOB: 27-Apr-1930, 84 y.o.   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 Follow-up imaging showed increased bilateral hilar lymphadenopathy and right upper lobe changes of radiation pneumonitis. He finally hadCT biopsy ofRUL lung mass 01/07/18 >>non-necrotizing granulomatous inflammation  PMH of hypertension, diabetes and hyperlipidemia and CKD4([place] ) He was initially on 20 mg of prednisone and  02/2018 restarted on 10 mg of prednisone  He also has nonischemic cardiomyopathy with EF 35% and required an AICD placement since his last visit. He has  been tapered to 5 mg of prednisone and is tolerating that.  A dry cough persists.  Delsym seems to provide relief Pedal edema has increased and he has increased his torsemide to twice daily    Significant tests/ events reviewed  CT chest without contrast >> right hilar soft tissue mass 6.64.8 cm which narrowed the superior vena cava and the right upper lobe bronchus. Scattered mediastinal lymph nodes were noted largest precarinal lymph node measuring 13 mm. A right lower lobe subpleural nodule about 5 mm was noted there was also a calcified granuloma in the right upper lobe.  ? PET scan 2020-03-917-hypermetabolic medial right upper lobe mass, hypermetabolic mediastinal lymph nodes, hypermetabolic left parotid nodule ? Bronchoscopy 03/23/2017-normal airways, EBUS biopsy of a right paratracheal node in the right upper lung with bronchial brushings/washings returned nondiagnostic ? Bronchoscopy/EBUSand mediastinoscopy 04/08/2017-multiple cytologies and lymph node biopsies revealed no  malignancy  Bronchoscopy/EBUS1/20/2019, no endobronchial tumor seen, brushings/biopsies from the right upper lobe and biopsies from multiple lymph nodes revealed "atypical cells ", nondiagnostic of malignancy  CT chest 03/2018 >> stable right paratracheal mass, radiation changes right upper lobe, bilateral lower lobe groundglass stable compared to 11/2017   PFTs4/2019 >>no airflow obstruction or restriction.Except for Diffusing defect FEV1 99%, ratio 75, FVC 92%, DLCO 59%. 11% bronchodilator change. Mid flow reversibility.  Review of Systems neg for any significant sore throat, dysphagia, itching, sneezing, nasal congestion or excess/ purulent secretions, fever, chills, sweats, unintended wt loss, pleuritic or exertional cp, hempoptysis, orthopnea pnd or change in chronic leg swelling. Also denies presyncope, palpitations, heartburn, abdominal pain, nausea, vomiting, diarrhea or change in bowel or urinary habits, dysuria,hematuria, rash, arthralgias, visual complaints, headache, numbness weakness or ataxia.     Objective:   Physical Exam Gen. [facility], elderly,well-nourished, in no distress ENT - no thrush, no post nasal drip Neck: No JVD, no thyromegaly, no carotid bruits Lungs: no use of accessory muscles, no dullness to percussion, clear without rales or rhonchi  Cardiovascular: Rhythm regular, heart sounds  normal, no murmurs or gallops, 1+ peripheral edema Musculoskeletal: No deformities, no cyanosis or clubbing         Assessment & Plan:

## 2018-08-04 NOTE — Patient Instructions (Signed)
Stay on 5 mg of prednisone. We will send in refills as needed CT scan appears stable

## 2018-08-04 NOTE — Assessment & Plan Note (Signed)
Stay on 5 mg of prednisone-since cough is persistent We will send in refills as needed CT scan appears stable  He will take flu shot with PCP

## 2018-08-05 ENCOUNTER — Other Ambulatory Visit: Payer: PPO

## 2018-08-05 ENCOUNTER — Encounter: Payer: Self-pay | Admitting: Cardiovascular Disease

## 2018-08-05 DIAGNOSIS — N183 Chronic kidney disease, stage 3 (moderate): Secondary | ICD-10-CM | POA: Diagnosis not present

## 2018-08-09 ENCOUNTER — Encounter: Payer: Self-pay | Admitting: Cardiovascular Disease

## 2018-08-09 ENCOUNTER — Ambulatory Visit (INDEPENDENT_AMBULATORY_CARE_PROVIDER_SITE_OTHER): Payer: PPO | Admitting: Cardiovascular Disease

## 2018-08-09 VITALS — BP 162/81 | HR 76 | Ht 72.0 in | Wt 215.6 lb

## 2018-08-09 DIAGNOSIS — I10 Essential (primary) hypertension: Secondary | ICD-10-CM | POA: Diagnosis not present

## 2018-08-09 DIAGNOSIS — N184 Chronic kidney disease, stage 4 (severe): Secondary | ICD-10-CM

## 2018-08-09 DIAGNOSIS — I5042 Chronic combined systolic (congestive) and diastolic (congestive) heart failure: Secondary | ICD-10-CM

## 2018-08-09 DIAGNOSIS — I429 Cardiomyopathy, unspecified: Secondary | ICD-10-CM

## 2018-08-09 DIAGNOSIS — I480 Paroxysmal atrial fibrillation: Secondary | ICD-10-CM | POA: Diagnosis not present

## 2018-08-09 DIAGNOSIS — E7800 Pure hypercholesterolemia, unspecified: Secondary | ICD-10-CM | POA: Diagnosis not present

## 2018-08-09 DIAGNOSIS — Z95810 Presence of automatic (implantable) cardiac defibrillator: Secondary | ICD-10-CM

## 2018-08-09 MED ORDER — POTASSIUM CHLORIDE CRYS ER 20 MEQ PO TBCR: EXTENDED_RELEASE_TABLET | ORAL | 3 refills | Status: DC

## 2018-08-09 MED ORDER — TORSEMIDE 20 MG PO TABS: ORAL_TABLET | ORAL | 3 refills | Status: DC

## 2018-08-09 NOTE — Patient Instructions (Addendum)
Your physician recommends that you weigh, daily, at the same time every day, and in the same amount of clothing. Please record your daily weights on the handout provided and bring it to your next appointment.  Your physician has recommended making the following medication changes: 1. TAKE Torsemide once daily based on your daily weight If weight is 203 pounds or more take 80 mg (4 tablets) If weight is 197-203 pounds take 40 mg (2 tablets) If weight is less than 197 pounds take 20 mg (1 tablet) 2. TAKE Potassium once daily based on your daily weight If weight is 203 pounds or more take 60 mEq (3 tablets) If weight is 197-203 pounds take 40 mEq (2 tablets) If weight is less than 197 pounds take 20 mEq (1 tablet)  Your physician recommends that you schedule a follow-up appointment in 2-3 weeks with a NP/PA.  Dr Sallyanne Kuster recommends that you schedule a follow-up appointment in 3 months.  If you need a refill on your cardiac medications before your next appointment, please call your pharmacy.   We will be enrolling you in monthly remote monitoring with Sharman Cheek, RN.

## 2018-08-09 NOTE — Progress Notes (Signed)
Patient ID: Ruben Conner, DDS, male   DOB: 06/24/1930, 84 y.o.   MRN: [id number]    Cardiology Office Note    Date:  08/10/2018   ID:  Ruben Conner, DDS, DOB Jun 07, 1930, MRN [id number]  PCP:  Chipper Herb, MD  Cardiologist:   Sanda Klein, MD   No chief complaint on file.   History of Present Illness:  Ruben Conner, DDS "Ruben Conner" is a 84 y.o. male with long-standing asymptomatic bradycardia, left bundle branch block and prolonged AV conduction time, pulmonary and mediastinal sarcoidosis, who recently developed persistent atrial fibrillation and acute heart failure. He has systemic hypertension and controlled type 2 diabetes mellitus on oral anti-diabetics.  2019 he developed depressed left ventricular systolic function (EF 88%) and on May 31, 2018 he underwent implantation of a dual-chamber CRT-P device ([employer], Dr. Lovena Le) and was subsequently started on amiodarone.  He had marked improvement in functional status and reduction in edema following implantation of the biventricular device.  He did best with a weight between 197 and 203 pounds.  Over the last month or so he has gradually regained weight and has developed substantial pedal edema, although his breathing is still pretty good.  Not have orthopnea or PND.  He denies any angina and has not had dizziness or syncope.  He has occasional "knocking" in the posterior left chest which appears to be positional and is highly suggestive of diaphragmatic stimulation. His blood pressure has been recently elevated, typical systolic at home has been in the mid 140s.  His device shows that he is fairly active, and average of 2 hours/day.  The thoracic impedance has shown a steady decline over the last 3 weeks during which he has been "wet".  He has 34% atrial pacing and 98% biventricular pacing.  There has been no atrial fibrillation and no ventricular tachycardia.  The coronary sinus lead is programmed unipolar D1-can.  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 The abnormality was first seen on a chest x-ray performed in February 2018, following a protracted case of upper respiratory infection and bronchitis. It was hypermetabolic on PET scan, but attempts to obtain a tissue diagnosis with both bronchoscopy and mediastinoscopy were unsuccessful.  After radiation therapy he developed worsening bilateral hilar lymphadenopathy and right upper lobe radiation pneumonitis.  Repeat attempts at biopsying the abnormality in February 2019 have led to a diagnosis of sarcoidosis, based on the presence of nonnecrotizing granulomas.  Pulmonary function tests did not show evidence of significant obstruction or restriction, but diffusion capacity was decreased to 59%.  He was started on steroid therapy.    He sees Dr. Joelyn Oms for CKD stage IV. Creatinine has been in the 2-2.7 range.  He remains concerned by the progressive dementia of his wife, [name], who is also my patient.  Many of the decisions he takes for his own health care are impacted by the fact that he wants to survive to take care of her.  Past Medical History:  Diagnosis Date  . Cancer ([place])    lung  . Diabetes mellitus without complication ([place])   . Hyperlipidemia   . Hypertension   . Irregular heart rate    Dr. Victorino December note from 04/2017 states bradycardia, LBBB  . Pneumonia   . Renal insufficiency     Past Surgical History:  Procedure Laterality Date  . BIV PACEMAKER INSERTION CRT-P  N/A 05/31/2018   Procedure: BIV PACEMAKER INSERTION CRT-P;  Surgeon: Evans Lance, MD;  Location: [facility];  Service: Cardiovascular;  Laterality: N/A;  . cyst removed    . ENDOBRONCHIAL ULTRASOUND Bilateral 03/23/2017   Procedure: ENDOBRONCHIAL ULTRASOUND;  Surgeon: Rigoberto Noel, MD;  Location: [facility];  Service: Cardiopulmonary;  Laterality: Bilateral;  . EYE  SURGERY    . MEDIASTINOSCOPY N/A 04/08/2017   Procedure: MEDIASTINOSCOPY;  Surgeon: Grace Isaac, MD;  Location: [facility];  Service: Thoracic;  Laterality: N/A;  . MOUTH SURGERY    . ROTATOR CUFF REPAIR    . TEE WITHOUT CARDIOVERSION N/A 05/28/2018   Procedure: TRANSESOPHAGEAL ECHOCARDIOGRAM (TEE);  Surgeon: Sanda Klein, MD;  Location: [facility];  Service: Cardiovascular;  Laterality: N/A;  . tumor remoced from neck    . VIDEO BRONCHOSCOPY WITH ENDOBRONCHIAL ULTRASOUND N/A 04/08/2017   Procedure: VIDEO BRONCHOSCOPY WITH ENDOBRONCHIAL ULTRASOUND;  Surgeon: Grace Isaac, MD;  Location: [facility];  Service: Thoracic;  Laterality: N/A;  . VIDEO BRONCHOSCOPY WITH ENDOBRONCHIAL ULTRASOUND N/A 12/08/2017   Procedure: VIDEO BRONCHOSCOPY with BRONCHIAL BIOPSIES AND ENDOBRONCHIAL ULTRASOUND WITH TRANSBRONCHIAL AND BRONCHIAL BIOPSIES;  Surgeon: Grace Isaac, MD;  Location: [facility];  Service: Thoracic;  Laterality: N/A;    Current Medications: Outpatient Medications Prior to Visit  Medication Sig Dispense Refill  . acetaminophen (TYLENOL) 500 MG tablet Take 1,000 mg by mouth every 6 (six) hours as needed for moderate pain or headache.    Marland Kitchen amiodarone (PACERONE) 200 MG tablet Take 1 tablet (200 mg total) by mouth daily. 30 tablet 3  . apixaban (ELIQUIS) 2.5 MG TABS tablet Take 1 tablet (2.5 mg total) by mouth 2 (two) times daily. 60 tablet 2  . carvedilol (COREG) 3.125 MG tablet Take 1 tablet (3.125 mg total) by mouth 2 (two) times daily. 60 tablet 11  . Cholecalciferol (VITAMIN D3) 5000 units CAPS Take 5,000 Units by mouth every evening.     Marland Kitchen glimepiride (AMARYL) 4 MG tablet TAKE 1 TABLET DAILY (Patient taking differently: Take 4 mg by mouth daily in the morning) 30 tablet 3  . glucose blood (ONETOUCH VERIO) test strip Twice Daily 100 each 12  . Multiple Vitamins-Minerals (MACULAR HEALTH FORMULA PO) Take 1 capsule by mouth daily.    . predniSONE (DELTASONE) 10 MG tablet TAKE (1) TABLET  DAILY WITH BREAKFAST. (Patient taking differently: TAKE 5 MG BY MOUTH DAILY WITH BREAKFAST.) 90 tablet 0  . potassium chloride SA (K-DUR,KLOR-CON) 20 MEQ tablet Take 2 tablets (40 mEq total) by mouth every morning. 90 tablet 3  . torsemide (DEMADEX) 20 MG tablet Take 40 mg by mouth daily.    Marland Kitchen torsemide (DEMADEX) 20 MG tablet Take 1 tablet (20 mg total) by mouth daily as directed. (Patient taking differently: Take 40 mg by mouth every morning. ) 90 tablet 3   No facility-administered medications prior to visit.      Allergies:   Patient has no known allergies.   Social History   Socioeconomic History  . Marital status: Married    Spouse name: Not on file  . Number of children: 1  . Years of education: Not on file  . Highest education level: Not on file  Occupational History  . Occupation: Retired Advice worker  . Financial resource strain: Not on file  . Food insecurity:    Worry: Not on file    Inability: Not on file  . Transportation needs:    Medical: Not on  file    Non-medical: Not on file  Tobacco Use  . Smoking status: Former Smoker    Packs/day: 0.10    Years: 53.00    Pack years: 5.30    Types: Cigarettes    Last attempt to quit: 11/28/2016    Years since quitting: 1.6  . Smokeless tobacco: Never Used  Substance and Sexual Activity  . Alcohol use: No    Alcohol/week: 0.0 standard drinks  . Drug use: No  . Sexual activity: Never  Lifestyle  . Physical activity:    Days per week: Not on file    Minutes per session: Not on file  . Stress: Not on file  Relationships  . Social connections:    Talks on phone: Not on file    Gets together: Not on file    Attends religious service: Not on file    Active member of club or organization: Not on file    Attends meetings of clubs or organizations: Not on file    Relationship status: Not on file  Other Topics Concern  . Not on file  Social History Narrative  . Not on file     Family History:  The  patient's family history includes Cancer in his son; Heart disease in his father; Kidney disease in his mother; Suicidality in his son.   ROS:   Please see the history of present illness.    ROS all other systems are reviewed and are negative  PHYSICAL EXAM:   VS:  BP (!) 162/81 (BP Location: Right Arm, Patient Position: Sitting, Cuff Size: Normal)   Pulse 76   Ht 6' (1.829 m)   Wt 215 lb 9.6 oz (97.8 kg)   BMI 29.24 kg/m      General: Alert, oriented x3, no distress Head: no evidence of trauma, PERRL, EOMI, no exophtalmos or lid lag, no myxedema, no xanthelasma; normal ears, nose and oropharynx Neck: 6-8 cm elevation in jugular venous pulsations and prompt hepatojugular reflux; brisk carotid pulses without delay and no carotid bruits Chest: clear to auscultation, no signs of consolidation by percussion or palpation, normal fremitus, symmetrical and full respiratory excursions Cardiovascular: normal position and quality of the apical impulse, regular rhythm, normal first and split second heart sounds, no murmurs, rubs or gallops Abdomen: no tenderness or distention, no masses by palpation, no abnormal pulsatility or arterial bruits, normal bowel sounds, no hepatosplenomegaly Extremities: no clubbing, cyanosis; he has symmetrical 3+ pedal edema, barely able to fit his shoes on ; 2+ radial, ulnar and brachial pulses bilaterally; 2+ right femoral, posterior tibial and dorsalis pedis pulses; 2+ left femoral, posterior tibial and dorsalis pedis pulses; no subclavian or femoral bruits Neurological: grossly nonfocal Psych: Normal mood and affect  Wt Readings from Last 3 Encounters:  08/09/18 215 lb 9.6 oz (97.8 kg)  08/04/18 214 lb 9.6 oz (97.3 kg)  07/13/18 210 lb (95.3 kg)    Studies/Labs Reviewed:   EKG:  EKG is not ordered today.  The intracardiac electrogram shows atrial sensed, biventricular paced rhythm  Recent Labs: 05/18/2018: TSH 0.823 06/21/2018: ALT 10; BUN 31; Creatinine, Ser  2.17; Hemoglobin 10.4; Platelets 203; Potassium 4.6; Sodium 141   Lipid Panel    Component Value Date/Time   CHOL 143 06/21/2018 1100   TRIG 100 06/21/2018 1100   TRIG 108 07/06/2014 0936   HDL 44 06/21/2018 1100   HDL 35 (L) 07/06/2014 0936   CHOLHDL 3.3 06/21/2018 1100   LDLCALC 79 06/21/2018 1100   LDLCALC  83 07/06/2014 0936     ASSESSMENT:    1. Chronic combined systolic and diastolic heart failure (HCC)   2. Paroxysmal atrial fibrillation (HCC)   3. Cardiomyopathy, unspecified type ([place])   4. Biventricular automatic implantable cardioverter defibrillator in situ   5. Essential hypertension   6. CKD (chronic kidney disease) stage 4, GFR 15-29 ml/min (HCC)   7. Hypercholesterolemia     PLAN:  In order of problems listed above:  1. CHF: He is clearly hypervolemic today and his dry weight seems to be under 203 pounds ounces on his home scale).  He improved after CRT, but volume retention has returned on the current dose of torsemide.  We will give him a weight-based torsemide/potassium chloride prescription and plan to reevaluate him quickly in clinic and with labs.  RAAS inhibitors are contraindicated with his degree of kidney failure.  He has been started on beta-blockers, and since he is hypervolemic now is not a good day to increase his carvedilol dose.  However, once he reaches euvolemic status, plan to increase the carvedilol to 6.25 mg twice daily. 2. AFib: Not detected since device implantation.  On Eliquis dose adjusted for age and renal dysfunction.  On amiodarone.  Will need liver function tests and thyroid function tests every 6 months, most recently checked June 21, 2018. 3. Cardiomyopathy: The cause of the cardiomyopathy is not immediately clear, but need to keep in mind the possibility that is also related to sarcoidosis.  Aortic and coronary artery vascular calcifications are clearly present on his CT chest, but not unexpected in an 84 year old.  He is not a great  candidate for either invasive coronary angiography or coronary CT angiography due to advanced chronic kidney disease.  We will repeat an echocardiogram after 3 months of freedom from atrial fibrillation (although it is unlikely he had tachycardia cardiomyopathy).  If EF is still low consider Lexiscan Myoview or a cardiac MRI.  Cardiac MRI will be the optimal diagnostic test for possible cardiac sarcoidosis, but we are reluctant to administer gadolinium with his renal function. 4. CRT-D: Normally functioning device with 98% biventricular pacing and clinical improvement.  He does have some diaphragmatic stimulation but this is relatively mild and positional.  No changes were made to device programming today, but might reexplore other pacing vectors when he comes back to the device clinic. 5. HTN: Blood pressure is high today.  Reevaluate once he is euvolemic. 6. CKD 4: This was presumed to be secondary to diabetes and hypertension, but Dr. Joelyn Oms reports in his notes that retrospectively he now wonders whether there may have been a connection with a diagnosis of sarcoidosis.  Monitor renal function and potassium level carefully while we adjust medications.  I believe Dr. Gilford Rile has an appointment with Dr. Joelyn Oms later this week and he will likely have labs checked then.  If not we will check his labs in a couple of weeks on the new diuretic regimen. 7. HLP: Target LDL under 100 due to diabetes mellitus, under 70 if we discover significant CAD, LDL was very close to target at 79.    Medication Adjustments/Labs and Tests Ordered: Current medicines are reviewed at length with the patient today.  Concerns regarding medicines are outlined above.  Medication changes, Labs and Tests ordered today are listed in the Patient Instructions below. Patient Instructions  Your physician recommends that you weigh, daily, at the same time every day, and in the same amount of clothing. Please record your daily weights on  the handout provided and bring it to your next appointment.  Your physician has recommended making the following medication changes: 1. TAKE Torsemide once daily based on your daily weight If weight is 203 pounds or more take 80 mg (4 tablets) If weight is 197-203 pounds take 40 mg (2 tablets) If weight is less than 197 pounds take 20 mg (1 tablet) 2. TAKE Potassium once daily based on your daily weight If weight is 203 pounds or more take 60 mEq (3 tablets) If weight is 197-203 pounds take 40 mEq (2 tablets) If weight is less than 197 pounds take 20 mEq (1 tablet)  Your physician recommends that you schedule a follow-up appointment in 2-3 weeks with a NP/PA.  Dr Sallyanne Kuster recommends that you schedule a follow-up appointment in 3 months.  If you need a refill on your cardiac medications before your next appointment, please call your pharmacy.   We will be enrolling you in monthly remote monitoring with Sharman Cheek, RN.    Signed, Sanda Klein, MD  08/10/2018 3:59 PM    [facility] [address], [place], [place]  [address] Phone: [phone]; Fax: [phone]

## 2018-08-10 DIAGNOSIS — I429 Cardiomyopathy, unspecified: Secondary | ICD-10-CM | POA: Insufficient documentation

## 2018-08-10 DIAGNOSIS — Z95810 Presence of automatic (implantable) cardiac defibrillator: Secondary | ICD-10-CM | POA: Insufficient documentation

## 2018-08-10 DIAGNOSIS — I480 Paroxysmal atrial fibrillation: Secondary | ICD-10-CM | POA: Insufficient documentation

## 2018-08-11 ENCOUNTER — Emergency Department (HOSPITAL_COMMUNITY): Payer: PPO

## 2018-08-11 ENCOUNTER — Other Ambulatory Visit: Payer: Self-pay

## 2018-08-11 ENCOUNTER — Inpatient Hospital Stay (HOSPITAL_COMMUNITY)
Admission: EM | Admit: 2018-08-11 | Discharge: 2018-08-17 | DRG: 500 | Disposition: A | Discharge status: Skilled Nursing Facility | Payer: PPO | Attending: Internal Medicine | Admitting: Internal Medicine

## 2018-08-11 ENCOUNTER — Telehealth: Payer: Self-pay

## 2018-08-11 ENCOUNTER — Encounter (HOSPITAL_COMMUNITY): Payer: Self-pay

## 2018-08-11 DIAGNOSIS — S52612D Displaced fracture of left ulna styloid process, subsequent encounter for closed fracture with routine healing: Secondary | ICD-10-CM | POA: Diagnosis not present

## 2018-08-11 DIAGNOSIS — S52572A Other intraarticular fracture of lower end of left radius, initial encounter for closed fracture: Principal | ICD-10-CM | POA: Diagnosis present

## 2018-08-11 DIAGNOSIS — D860 Sarcoidosis of lung: Secondary | ICD-10-CM | POA: Diagnosis not present

## 2018-08-11 DIAGNOSIS — N179 Acute kidney failure, unspecified: Secondary | ICD-10-CM | POA: Diagnosis not present

## 2018-08-11 DIAGNOSIS — S0181XA Laceration without foreign body of other part of head, initial encounter: Secondary | ICD-10-CM | POA: Diagnosis not present

## 2018-08-11 DIAGNOSIS — I509 Heart failure, unspecified: Secondary | ICD-10-CM

## 2018-08-11 DIAGNOSIS — R001 Bradycardia, unspecified: Secondary | ICD-10-CM | POA: Diagnosis not present

## 2018-08-11 DIAGNOSIS — I5023 Acute on chronic systolic (congestive) heart failure: Secondary | ICD-10-CM | POA: Diagnosis present

## 2018-08-11 DIAGNOSIS — I480 Paroxysmal atrial fibrillation: Secondary | ICD-10-CM | POA: Diagnosis present

## 2018-08-11 DIAGNOSIS — S61412A Laceration without foreign body of left hand, initial encounter: Secondary | ICD-10-CM | POA: Diagnosis present

## 2018-08-11 DIAGNOSIS — R0600 Dyspnea, unspecified: Secondary | ICD-10-CM | POA: Diagnosis not present

## 2018-08-11 DIAGNOSIS — Z23 Encounter for immunization: Secondary | ICD-10-CM | POA: Diagnosis not present

## 2018-08-11 DIAGNOSIS — I447 Left bundle-branch block, unspecified: Secondary | ICD-10-CM | POA: Diagnosis present

## 2018-08-11 DIAGNOSIS — J700 Acute pulmonary manifestations due to radiation: Secondary | ICD-10-CM | POA: Diagnosis present

## 2018-08-11 DIAGNOSIS — W19XXXA Unspecified fall, initial encounter: Secondary | ICD-10-CM

## 2018-08-11 DIAGNOSIS — S01111A Laceration without foreign body of right eyelid and periocular area, initial encounter: Secondary | ICD-10-CM | POA: Diagnosis not present

## 2018-08-11 DIAGNOSIS — I5030 Unspecified diastolic (congestive) heart failure: Secondary | ICD-10-CM | POA: Diagnosis not present

## 2018-08-11 DIAGNOSIS — S52592D Other fractures of lower end of left radius, subsequent encounter for closed fracture with routine healing: Secondary | ICD-10-CM | POA: Diagnosis not present

## 2018-08-11 DIAGNOSIS — S61402A Unspecified open wound of left hand, initial encounter: Secondary | ICD-10-CM | POA: Diagnosis present

## 2018-08-11 DIAGNOSIS — S62102A Fracture of unspecified carpal bone, left wrist, initial encounter for closed fracture: Secondary | ICD-10-CM

## 2018-08-11 DIAGNOSIS — K5909 Other constipation: Secondary | ICD-10-CM | POA: Diagnosis not present

## 2018-08-11 DIAGNOSIS — S0990XA Unspecified injury of head, initial encounter: Secondary | ICD-10-CM | POA: Diagnosis not present

## 2018-08-11 DIAGNOSIS — R58 Hemorrhage, not elsewhere classified: Secondary | ICD-10-CM | POA: Diagnosis not present

## 2018-08-11 DIAGNOSIS — K5900 Constipation, unspecified: Secondary | ICD-10-CM | POA: Diagnosis not present

## 2018-08-11 DIAGNOSIS — I429 Cardiomyopathy, unspecified: Secondary | ICD-10-CM | POA: Diagnosis not present

## 2018-08-11 DIAGNOSIS — Z7901 Long term (current) use of anticoagulants: Secondary | ICD-10-CM

## 2018-08-11 DIAGNOSIS — N184 Chronic kidney disease, stage 4 (severe): Secondary | ICD-10-CM | POA: Diagnosis present

## 2018-08-11 DIAGNOSIS — N183 Chronic kidney disease, stage 3 (moderate): Secondary | ICD-10-CM | POA: Diagnosis not present

## 2018-08-11 DIAGNOSIS — Z923 Personal history of irradiation: Secondary | ICD-10-CM | POA: Diagnosis not present

## 2018-08-11 DIAGNOSIS — D631 Anemia in chronic kidney disease: Secondary | ICD-10-CM | POA: Diagnosis not present

## 2018-08-11 DIAGNOSIS — E876 Hypokalemia: Secondary | ICD-10-CM | POA: Diagnosis not present

## 2018-08-11 DIAGNOSIS — E1122 Type 2 diabetes mellitus with diabetic chronic kidney disease: Secondary | ICD-10-CM | POA: Diagnosis not present

## 2018-08-11 DIAGNOSIS — Z7952 Long term (current) use of systemic steroids: Secondary | ICD-10-CM

## 2018-08-11 DIAGNOSIS — S52612A Displaced fracture of left ulna styloid process, initial encounter for closed fracture: Secondary | ICD-10-CM | POA: Diagnosis not present

## 2018-08-11 DIAGNOSIS — R609 Edema, unspecified: Secondary | ICD-10-CM | POA: Diagnosis not present

## 2018-08-11 DIAGNOSIS — I130 Hypertensive heart and chronic kidney disease with heart failure and stage 1 through stage 4 chronic kidney disease, or unspecified chronic kidney disease: Secondary | ICD-10-CM | POA: Diagnosis present

## 2018-08-11 DIAGNOSIS — Z794 Long term (current) use of insulin: Secondary | ICD-10-CM

## 2018-08-11 DIAGNOSIS — Y842 Radiological procedure and radiotherapy as the cause of abnormal reaction of the patient, or of later complication, without mention of misadventure at the time of the procedure: Secondary | ICD-10-CM | POA: Diagnosis present

## 2018-08-11 DIAGNOSIS — E785 Hyperlipidemia, unspecified: Secondary | ICD-10-CM | POA: Diagnosis present

## 2018-08-11 DIAGNOSIS — Z95810 Presence of automatic (implantable) cardiac defibrillator: Secondary | ICD-10-CM | POA: Diagnosis not present

## 2018-08-11 DIAGNOSIS — G8918 Other acute postprocedural pain: Secondary | ICD-10-CM | POA: Diagnosis not present

## 2018-08-11 DIAGNOSIS — D869 Sarcoidosis, unspecified: Secondary | ICD-10-CM | POA: Diagnosis present

## 2018-08-11 DIAGNOSIS — W010XXA Fall on same level from slipping, tripping and stumbling without subsequent striking against object, initial encounter: Secondary | ICD-10-CM | POA: Diagnosis not present

## 2018-08-11 DIAGNOSIS — R918 Other nonspecific abnormal finding of lung field: Secondary | ICD-10-CM | POA: Diagnosis not present

## 2018-08-11 DIAGNOSIS — S61412D Laceration without foreign body of left hand, subsequent encounter: Secondary | ICD-10-CM | POA: Diagnosis not present

## 2018-08-11 DIAGNOSIS — I248 Other forms of acute ischemic heart disease: Secondary | ICD-10-CM | POA: Diagnosis present

## 2018-08-11 DIAGNOSIS — I428 Other cardiomyopathies: Secondary | ICD-10-CM | POA: Diagnosis not present

## 2018-08-11 DIAGNOSIS — Z87891 Personal history of nicotine dependence: Secondary | ICD-10-CM

## 2018-08-11 DIAGNOSIS — J439 Emphysema, unspecified: Secondary | ICD-10-CM | POA: Diagnosis not present

## 2018-08-11 DIAGNOSIS — I700 Atherosclerosis of aorta: Secondary | ICD-10-CM | POA: Diagnosis not present

## 2018-08-11 DIAGNOSIS — S299XXA Unspecified injury of thorax, initial encounter: Secondary | ICD-10-CM | POA: Diagnosis not present

## 2018-08-11 DIAGNOSIS — J701 Chronic and other pulmonary manifestations due to radiation: Secondary | ICD-10-CM | POA: Diagnosis not present

## 2018-08-11 DIAGNOSIS — Z85118 Personal history of other malignant neoplasm of bronchus and lung: Secondary | ICD-10-CM | POA: Diagnosis not present

## 2018-08-11 DIAGNOSIS — S61419A Laceration without foreign body of unspecified hand, initial encounter: Secondary | ICD-10-CM

## 2018-08-11 DIAGNOSIS — Z79899 Other long term (current) drug therapy: Secondary | ICD-10-CM

## 2018-08-11 DIAGNOSIS — S66327A Laceration of extensor muscle, fascia and tendon of left little finger at wrist and hand level, initial encounter: Secondary | ICD-10-CM | POA: Diagnosis present

## 2018-08-11 DIAGNOSIS — I5021 Acute systolic (congestive) heart failure: Secondary | ICD-10-CM | POA: Diagnosis not present

## 2018-08-11 DIAGNOSIS — Z7984 Long term (current) use of oral hypoglycemic drugs: Secondary | ICD-10-CM | POA: Diagnosis not present

## 2018-08-11 DIAGNOSIS — S6992XA Unspecified injury of left wrist, hand and finger(s), initial encounter: Secondary | ICD-10-CM | POA: Diagnosis not present

## 2018-08-11 DIAGNOSIS — I10 Essential (primary) hypertension: Secondary | ICD-10-CM | POA: Diagnosis not present

## 2018-08-11 DIAGNOSIS — I129 Hypertensive chronic kidney disease with stage 1 through stage 4 chronic kidney disease, or unspecified chronic kidney disease: Secondary | ICD-10-CM | POA: Diagnosis not present

## 2018-08-11 DIAGNOSIS — M79645 Pain in left finger(s): Secondary | ICD-10-CM | POA: Diagnosis not present

## 2018-08-11 DIAGNOSIS — S62617A Displaced fracture of proximal phalanx of left little finger, initial encounter for closed fracture: Secondary | ICD-10-CM | POA: Diagnosis present

## 2018-08-11 DIAGNOSIS — E7800 Pure hypercholesterolemia, unspecified: Secondary | ICD-10-CM | POA: Diagnosis not present

## 2018-08-11 DIAGNOSIS — S01112A Laceration without foreign body of left eyelid and periocular area, initial encounter: Secondary | ICD-10-CM | POA: Diagnosis not present

## 2018-08-11 DIAGNOSIS — Z01811 Encounter for preprocedural respiratory examination: Secondary | ICD-10-CM | POA: Diagnosis not present

## 2018-08-11 DIAGNOSIS — S0181XD Laceration without foreign body of other part of head, subsequent encounter: Secondary | ICD-10-CM | POA: Diagnosis not present

## 2018-08-11 DIAGNOSIS — W010XXD Fall on same level from slipping, tripping and stumbling without subsequent striking against object, subsequent encounter: Secondary | ICD-10-CM | POA: Diagnosis not present

## 2018-08-11 HISTORY — DX: Presence of cardiac pacemaker: Z95.0

## 2018-08-11 HISTORY — DX: Type 2 diabetes mellitus without complications: E11.9

## 2018-08-11 HISTORY — DX: Unspecified fall, initial encounter: W19.XXXA

## 2018-08-11 HISTORY — DX: Unspecified osteoarthritis, unspecified site: M19.90

## 2018-08-11 HISTORY — DX: Sarcoidosis of lung: D86.0

## 2018-08-11 HISTORY — DX: Chronic kidney disease, stage 4 (severe): N18.4

## 2018-08-11 HISTORY — DX: Heart failure, unspecified: I50.9

## 2018-08-11 LAB — BASIC METABOLIC PANEL
ANION GAP: 9 (ref 5–15)
BUN: 23 mg/dL (ref 8–23)
CALCIUM: 9 mg/dL (ref 8.9–10.3)
CO2: 27 mmol/L (ref 22–32)
Chloride: 104 mmol/L (ref 98–111)
Creatinine, Ser: 2.78 mg/dL — ABNORMAL HIGH (ref 0.61–1.24)
GFR calc Af Amer: 22 mL/min — ABNORMAL LOW (ref >60)
GFR calc non Af Amer: 19 mL/min — ABNORMAL LOW (ref >60)
GLUCOSE: 199 mg/dL — AB (ref 70–99)
Potassium: 4.2 mmol/L (ref 3.5–5.1)
Sodium: 140 mmol/L (ref 135–145)

## 2018-08-11 LAB — CBC WITH DIFFERENTIAL/PLATELET
Abs Immature Granulocytes: 0.1 10*3/uL (ref 0.0–0.1)
Basophils Absolute: 0.1 10*3/uL (ref 0.0–0.1)
Basophils Relative: 1 %
EOS ABS: 0.1 10*3/uL (ref 0.0–0.7)
EOS PCT: 1 %
HEMATOCRIT: 30.8 % — AB (ref 39.0–52.0)
Hemoglobin: 9.6 g/dL — ABNORMAL LOW (ref 13.0–17.0)
IMMATURE GRANULOCYTES: 1 %
LYMPHS ABS: 0.6 10*3/uL — AB (ref 0.7–4.0)
Lymphocytes Relative: 7 %
MCH: 27.7 pg (ref 26.0–34.0)
MCHC: 31.2 g/dL (ref 30.0–36.0)
MCV: 88.8 fL (ref 78.0–100.0)
MONO ABS: 0.6 10*3/uL (ref 0.1–1.0)
MONOS PCT: 7 %
Neutro Abs: 7.1 10*3/uL (ref 1.7–7.7)
Neutrophils Relative %: 83 %
Platelets: 245 10*3/uL (ref 150–400)
RBC: 3.47 MIL/uL — ABNORMAL LOW (ref 4.22–5.81)
RDW: 13.9 % (ref 11.5–15.5)
WBC: 8.5 10*3/uL (ref 4.0–10.5)

## 2018-08-11 LAB — URINALYSIS, ROUTINE W REFLEX MICROSCOPIC
BILIRUBIN URINE: NEGATIVE
Glucose, UA: >=500 mg/dL — AB
Ketones, ur: NEGATIVE mg/dL
LEUKOCYTES UA: NEGATIVE
Nitrite: NEGATIVE
PROTEIN: 100 mg/dL — AB
Specific Gravity, Urine: 1.008 (ref 1.005–1.030)
pH: 6 (ref 5.0–8.0)

## 2018-08-11 LAB — PROTIME-INR
INR: 1.1
Prothrombin Time: 14.1 seconds (ref 11.4–15.2)

## 2018-08-11 LAB — BRAIN NATRIURETIC PEPTIDE: B NATRIURETIC PEPTIDE 5: 369.4 pg/mL — AB (ref 0.0–100.0)

## 2018-08-11 LAB — CBG MONITORING, ED: Glucose-Capillary: 172 mg/dL — ABNORMAL HIGH (ref 70–99)

## 2018-08-11 MED ORDER — LIDOCAINE-EPINEPHRINE (PF) 2 %-1:200000 IJ SOLN
10.0000 mL | Freq: Once | INTRAMUSCULAR | Status: AC
  Administered 2018-08-11: 10 mL
  Filled 2018-08-11: qty 20

## 2018-08-11 MED ORDER — CEFAZOLIN SODIUM-DEXTROSE 2-4 GM/100ML-% IV SOLN
2.0000 g | Freq: Once | INTRAVENOUS | Status: AC
  Administered 2018-08-11: 2 g via INTRAVENOUS
  Filled 2018-08-11: qty 100

## 2018-08-11 MED ORDER — AMIODARONE HCL 200 MG PO TABS
200.0000 mg | ORAL_TABLET | Freq: Every day | ORAL | Status: DC
  Administered 2018-08-11 – 2018-08-17 (×7): 200 mg via ORAL
  Filled 2018-08-11 (×7): qty 1

## 2018-08-11 MED ORDER — HYDROCODONE-ACETAMINOPHEN 5-325 MG PO TABS
1.0000 | ORAL_TABLET | ORAL | Status: DC | PRN
  Administered 2018-08-11: 1 via ORAL
  Administered 2018-08-13 – 2018-08-14 (×3): 2 via ORAL
  Filled 2018-08-11 (×2): qty 2
  Filled 2018-08-11: qty 1
  Filled 2018-08-11: qty 2

## 2018-08-11 MED ORDER — TETANUS-DIPHTH-ACELL PERTUSSIS 5-2.5-18.5 LF-MCG/0.5 IM SUSP
0.5000 mL | Freq: Once | INTRAMUSCULAR | Status: AC
  Administered 2018-08-11: 0.5 mL via INTRAMUSCULAR
  Filled 2018-08-11: qty 0.5

## 2018-08-11 MED ORDER — POLYETHYLENE GLYCOL 3350 17 G PO PACK: 17.0000 g | PACK | Freq: Every day | ORAL | Status: DC | PRN

## 2018-08-11 MED ORDER — FENTANYL CITRATE (PF) 100 MCG/2ML IJ SOLN
75.0000 ug | Freq: Once | INTRAMUSCULAR | Status: AC
  Administered 2018-08-11: 75 ug via INTRAVENOUS
  Filled 2018-08-11: qty 2

## 2018-08-11 MED ORDER — CARVEDILOL 3.125 MG PO TABS
3.1250 mg | ORAL_TABLET | Freq: Two times a day (BID) | ORAL | Status: DC
  Administered 2018-08-12 – 2018-08-17 (×10): 3.125 mg via ORAL
  Filled 2018-08-11 (×10): qty 1

## 2018-08-11 MED ORDER — ACETAMINOPHEN 650 MG RE SUPP: 650.0000 mg | Freq: Four times a day (QID) | RECTAL | Status: DC | PRN

## 2018-08-11 MED ORDER — SODIUM CHLORIDE 0.9 % IV SOLN: 250.0000 mL | INTRAVENOUS | Status: DC | PRN

## 2018-08-11 MED ORDER — INSULIN ASPART 100 UNIT/ML ~~LOC~~ SOLN
0.0000 [IU] | SUBCUTANEOUS | Status: DC
  Administered 2018-08-11: 2 [IU] via SUBCUTANEOUS
  Administered 2018-08-12: 3 [IU] via SUBCUTANEOUS
  Administered 2018-08-12: 2 [IU] via SUBCUTANEOUS
  Administered 2018-08-12: 3 [IU] via SUBCUTANEOUS
  Administered 2018-08-13 (×3): 2 [IU] via SUBCUTANEOUS
  Administered 2018-08-13 – 2018-08-14 (×2): 1 [IU] via SUBCUTANEOUS
  Administered 2018-08-14: 5 [IU] via SUBCUTANEOUS
  Administered 2018-08-14: 3 [IU] via SUBCUTANEOUS
  Administered 2018-08-14: 2 [IU] via SUBCUTANEOUS
  Administered 2018-08-14: 1 [IU] via SUBCUTANEOUS
  Administered 2018-08-14 – 2018-08-15 (×2): 2 [IU] via SUBCUTANEOUS
  Administered 2018-08-15 (×2): 3 [IU] via SUBCUTANEOUS
  Administered 2018-08-15: 1 [IU] via SUBCUTANEOUS
  Administered 2018-08-16: 3 [IU] via SUBCUTANEOUS
  Administered 2018-08-16: 1 [IU] via SUBCUTANEOUS
  Administered 2018-08-16: 2 [IU] via SUBCUTANEOUS
  Administered 2018-08-16: 1 [IU] via SUBCUTANEOUS
  Filled 2018-08-11: qty 1

## 2018-08-11 MED ORDER — SODIUM CHLORIDE 0.9% FLUSH
3.0000 mL | Freq: Two times a day (BID) | INTRAVENOUS | Status: DC
  Administered 2018-08-11 – 2018-08-17 (×11): 3 mL via INTRAVENOUS

## 2018-08-11 MED ORDER — ONDANSETRON HCL 4 MG/2ML IJ SOLN: 4.0000 mg | Freq: Four times a day (QID) | INTRAMUSCULAR | Status: DC | PRN

## 2018-08-11 MED ORDER — FENTANYL CITRATE (PF) 100 MCG/2ML IJ SOLN
100.0000 ug | Freq: Once | INTRAMUSCULAR | Status: AC
  Administered 2018-08-11: 100 ug via INTRAVENOUS
  Filled 2018-08-11: qty 2

## 2018-08-11 MED ORDER — ONDANSETRON HCL 4 MG PO TABS: 4.0000 mg | ORAL_TABLET | Freq: Four times a day (QID) | ORAL | Status: DC | PRN

## 2018-08-11 MED ORDER — FENTANYL CITRATE (PF) 100 MCG/2ML IJ SOLN: 50.0000 ug | INTRAMUSCULAR | Status: DC | PRN

## 2018-08-11 MED ORDER — SODIUM CHLORIDE 0.9% FLUSH: 3.0000 mL | INTRAVENOUS | Status: DC | PRN

## 2018-08-11 MED ORDER — GUAIFENESIN ER 600 MG PO TB12
600.0000 mg | ORAL_TABLET | Freq: Two times a day (BID) | ORAL | Status: DC
  Administered 2018-08-11 – 2018-08-17 (×11): 600 mg via ORAL
  Filled 2018-08-11 (×12): qty 1

## 2018-08-11 MED ORDER — ACETAMINOPHEN 325 MG PO TABS: 650.0000 mg | ORAL_TABLET | Freq: Four times a day (QID) | ORAL | Status: DC | PRN

## 2018-08-11 MED ORDER — PREDNISONE 5 MG PO TABS
5.0000 mg | ORAL_TABLET | Freq: Every day | ORAL | Status: DC
  Administered 2018-08-12 – 2018-08-17 (×6): 5 mg via ORAL
  Filled 2018-08-11 (×6): qty 1

## 2018-08-11 NOTE — ED Provider Notes (Signed)
[facility] EMERGENCY DEPARTMENT Provider Note   CSN: [id number] Arrival date & time: 08/11/18  1404     History   Chief Complaint Chief Complaint  Patient presents with  . Fall  . Head Laceration  . 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  HPI  84 year old male with history of diabetes, hypertension, CKD and A. fib comes in with chief complaint of fall.  Patient had a mechanical fall prior to ED arrival.  Patient is complaining of pain in his left hand and he also has significant skin tear over the left hand.  Patient also has laceration to his left side of the forehead.  He denies any vision changes or pain in his eye.  Past Medical History:  Diagnosis Date  . Cancer ([place])    lung  . Diabetes mellitus without complication ([place])   . Hyperlipidemia   . Hypertension   . Irregular heart rate    Dr. Victorino December note from 04/2017 states bradycardia, LBBB  . Pneumonia   . Renal insufficiency     Patient Active Problem List   Diagnosis Date Noted  . Paroxysmal atrial fibrillation ([place]) 08/10/2018  . Cardiomyopathy ([place]) 08/10/2018  . Biventricular automatic implantable cardioverter defibrillator in situ 08/10/2018  . Chronic combined systolic and diastolic heart failure ([place]) 05/31/2018  . Acute systolic heart failure ([place]) 05/24/2018  . Persistent atrial fibrillation ([place]) 05/24/2018  . Sarcoidosis 04/06/2018  . CKD (chronic kidney disease) stage 4, GFR 15-29 ml/min ([facility]) 04/06/2018  . Edema 02/23/2018  . Mediastinal lymphadenopathy 03/10/2017  . Anemia in stage 4 chronic kidney disease ([place]) 09/18/2016  . Vitamin D deficiency 09/18/2016  . Sinus bradycardia 08/31/2014  . First degree AV block 08/31/2014  . LBBB (left bundle branch block) 08/31/2014  . Essential hypertension 09/20/2013  . Type 2 diabetes mellitus with stage 3 chronic kidney disease, without long-term current use of insulin ([place]) 05/02/2013  . Hypercholesterolemia  05/02/2013    Past Surgical History:  Procedure Laterality Date  . BIV PACEMAKER INSERTION CRT-P N/A 05/31/2018   Procedure: BIV PACEMAKER INSERTION CRT-P;  Surgeon: Evans Lance, MD;  Location: [facility];  Service: Cardiovascular;  Laterality: N/A;  . cyst removed    . ENDOBRONCHIAL ULTRASOUND Bilateral 03/23/2017   Procedure: ENDOBRONCHIAL ULTRASOUND;  Surgeon: Rigoberto Noel, MD;  Location: [facility];  Service: Cardiopulmonary;  Laterality: Bilateral;  . EYE SURGERY    . MEDIASTINOSCOPY N/A 04/08/2017   Procedure: MEDIASTINOSCOPY;  Surgeon: Grace Isaac, MD;  Location: [facility];  Service: Thoracic;  Laterality: N/A;  . MOUTH SURGERY    . ROTATOR CUFF REPAIR    . TEE WITHOUT CARDIOVERSION N/A 05/28/2018   Procedure: TRANSESOPHAGEAL ECHOCARDIOGRAM (TEE);  Surgeon: Sanda Klein, MD;  Location: [facility];  Service: Cardiovascular;  Laterality: N/A;  . tumor remoced from neck    . VIDEO BRONCHOSCOPY WITH ENDOBRONCHIAL ULTRASOUND N/A 04/08/2017   Procedure: VIDEO BRONCHOSCOPY WITH ENDOBRONCHIAL ULTRASOUND;  Surgeon: Grace Isaac, MD;  Location: [facility];  Service: Thoracic;  Laterality: N/A;  . VIDEO BRONCHOSCOPY WITH ENDOBRONCHIAL ULTRASOUND N/A 12/08/2017   Procedure: VIDEO BRONCHOSCOPY with BRONCHIAL BIOPSIES AND ENDOBRONCHIAL ULTRASOUND WITH TRANSBRONCHIAL AND BRONCHIAL BIOPSIES;  Surgeon: Grace Isaac, MD;  Location: [facility];  Service: Thoracic;  Laterality: N/A;        Home Medications    Prior to Admission medications   Medication Sig Start Date End Date Taking? Authorizing Provider  acetaminophen (TYLENOL) 500 MG tablet Take 1,000 mg by mouth every 6 (six) hours as needed for moderate pain or headache.    [provider]  amiodarone (PACERONE) 200 MG tablet Take 1 tablet (200 mg total) by mouth daily. 05/28/18   Croitoru, Mihai, MD  apixaban (ELIQUIS) 2.5 MG TABS tablet Take 1 tablet (2.5 mg total) by mouth 2 (two) times daily. 05/18/18   Chipper Herb, MD  carvedilol (COREG) 3.125 MG tablet Take 1 tablet (3.125 mg total) by mouth 2 (two) times daily. 06/01/18 06/01/19  Baldwin Jamaica, PA-C  Cholecalciferol (VITAMIN D3) 5000 units CAPS Take 5,000 Units by mouth every evening.     [provider]  glimepiride (AMARYL) 4 MG tablet TAKE 1 TABLET DAILY Patient taking differently: Take 4 mg by mouth daily in the morning 04/14/18   Chipper Herb, MD  glucose blood [facility] VERIO) test strip Twice Daily 01/25/18   Chipper Herb, MD  Multiple Vitamins-Minerals (MACULAR HEALTH FORMULA PO) Take 1 capsule by mouth daily.    [provider]  potassium chloride SA (K-DUR,KLOR-CON) 20 MEQ tablet Take 1-3 tablets (20-60 mEq total) by mouth daily as directed 08/09/18   Croitoru, Mihai, MD  predniSONE (DELTASONE) 10 MG tablet TAKE (1) TABLET DAILY WITH BREAKFAST. Patient taking differently: TAKE 5 MG BY MOUTH DAILY WITH BREAKFAST. 04/23/18   Rigoberto Noel, MD  torsemide (DEMADEX) 20 MG tablet Take 1-4 tablets (20-80 mg total) by mouth daily as directed. 08/09/18   Croitoru, Dani Gobble, MD    Family History Family History  Problem Relation Age of Onset  . Kidney disease Mother   . Heart disease Father   . Cancer Son        prostate  . Suicidality Son     Social History Social History   Tobacco Use  . Smoking status: Former Smoker    Packs/day: 0.10    Years: 53.00    Pack years: 5.30    Types: Cigarettes    Last attempt to quit: 11/28/2016    Years since quitting: 1.7  . Smokeless tobacco: Never Used  Substance Use Topics  . Alcohol use: No    Alcohol/week: 0.0 standard drinks  . Drug use: No     Allergies   Patient has no known allergies.   Review of Systems Review of Systems  Constitutional: Positive for activity change.  Respiratory: Negative for shortness of breath.   Cardiovascular: Negative for chest pain.  Musculoskeletal: Positive for arthralgias and myalgias.  Skin: Positive for wound.    Hematological: Bruises/bleeds easily.  All other systems reviewed and are negative.    Physical Exam Updated Vital Signs BP (!) 171/89 (BP Location: Right Arm)   Pulse 74   Temp 98.1 F (36.7 C) (Oral)   Resp (!) 24   Ht 6' (1.829 m)   Wt 96.6 kg   SpO2 96%   BMI 28.89 kg/m   Physical Exam  Constitutional: He is oriented to person, place, and time. He appears well-developed.  HENT:  Head: Atraumatic.  Eyes: Pupils are equal, round, and reactive to light. EOM are normal.  Left-sided periorbital edema. Extraocular muscles intact  Neck: Neck supple.  Cardiovascular: Normal rate.  Pulmonary/Chest: Effort normal.  Musculoskeletal:  Large skin tear over the left and dorsally. Also a laceration to the left side of the forehead  Neurological: He is alert and oriented to person, place, and time.  Skin: Skin is warm.  Nursing note and vitals  reviewed.      ED Treatments / Results  Labs (all labs ordered are listed, but only abnormal results are displayed) Labs Reviewed  CBC WITH DIFFERENTIAL/PLATELET  BASIC METABOLIC PANEL  PROTIME-INR  I-STAT CHEM 8, ED    EKG EKG Interpretation  Date/Time:  Wednesday August 11 2018 14:08:32 EDT Ventricular Rate:  79 PR Interval:    QRS Duration: 151 QT Interval:  472 QTC Calculation: 542 R Axis:   141 Text Interpretation:  Sinus or ectopic atrial rhythm IVCD, consider atypical RBBB Lateral infarct, age indeterminate Probable anteroseptal infarct, old No acute changes No significant change since last tracing Confirmed by Varney Biles [phone]) on 08/11/2018 2:49:11 PM   Radiology Dg Hand Complete Left  Result Date: 08/11/2018 CLINICAL DATA:  Fall EXAM: LEFT HAND - COMPLETE 3+ VIEW COMPARISON:  None. FINDINGS: Osteopenia. There is deformity at the base of the proximal phalanx of the small finger. In impaction type fracture is not excluded. There is also deformity of the distal radius that is poorly visualized worrisome for  a comminuted distal radius fracture. IMPRESSION: Possible fracture at the base of the proximal phalanx of the small finger. Dedicated small finger views may be helpful. Comminuted displaced fracture of the distal radius is suspected. A wrist radiograph series is recommended. Electronically Signed   By: Marybelle Killings M.D.   On: 08/11/2018 15:07    Procedures Procedures (including critical care time)  Medications Ordered in ED Medications  ceFAZolin (ANCEF) IVPB 2g/100 mL premix (2 g Intravenous New Bag/Given 08/11/18 1454)  fentaNYL (SUBLIMAZE) injection 75 mcg (75 mcg Intravenous Given 08/11/18 1446)  Tdap (BOOSTRIX) injection 0.5 mL (0.5 mLs Intramuscular Given 08/11/18 1449)  lidocaine-EPINEPHrine (XYLOCAINE W/EPI) 2 %-1:200000 (PF) injection 10 mL (10 mLs Infiltration Given 08/11/18 1447)     Initial Impression / Assessment and Plan / ED Course  I have reviewed the triage vital signs and the nursing notes.  Pertinent labs & imaging results that were available during my care of the patient were reviewed by me and considered in my medical decision making (see chart for details).  Clinical Course as of Aug 11 1513  Wed Aug 11, 2018  1513 Adding x-ray of the left wrist and small digit. Given that patient is on Eliquis and is immunosuppressed -we have deferred the care of the significant skin tear over the left and to the orthopedic service.  Patient also has fracture to the same side which will need further assessment.  DG Wrist Complete Left [AN]    Clinical Course User Index [AN] Varney Biles, MD    84 year old male comes in with chief complaint of fall. Patient has a laceration over his left forehead.  He also had a [place] type injury with what appears to be a fracture of distal wrist.  Patient also has pretty significant skin tear over the dorsum of the left hand, with exposure of underlying fascial layers and tendon.  Patient is on Eliquis as well.  Plan is to give him 2 g of Ancef  and update his tetanus. CT scan of the head and x-ray of the left hand ordered.   Final Clinical Impressions(s) / ED Diagnoses   Final diagnoses:  Fall, initial encounter  Facial laceration, initial encounter  Skin tear of hand without complication, initial encounter    ED Discharge Orders    None       Varney Biles, MD 08/11/18 1514

## 2018-08-11 NOTE — Progress Notes (Signed)
Received report from ER

## 2018-08-11 NOTE — ED Notes (Signed)
Patient transported to CT 

## 2018-08-11 NOTE — ED Notes (Signed)
Admitting MD at bedside.

## 2018-08-11 NOTE — ED Triage Notes (Signed)
Patient states mechanical fall with no LOC. Head lac over left eye/forehead area and left hand with deep skin tear.

## 2018-08-11 NOTE — ED Notes (Signed)
ED Provider at bedside for procedure.

## 2018-08-11 NOTE — H&P (Signed)
Ruben Conner, DDS BPZ:[id number] DOB: 12/27/1929 DOA: 08/11/2018     PCP: Chipper Herb, MD   Outpatient Specialists:  CARDS: Dr. Sallyanne Kuster, Dr. Lovena Le NEphrology:   Dr. Joelyn Oms   Pulmonary   Dr. Elsworth Soho    Patient arrived to ER on 08/11/18 at 1404  Patient coming from: home Lives  With family    Chief Complaint:  Chief Complaint  Patient presents with  . Fall  . Head Laceration  . 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 CKD stage IV cr at baseline 2-2.5, HTN, LBBB    Presented with mechanical fall today resulting in head laceration of the left eye and left hand skin tear. NO LOC.  He was on the way to see his Nephrologist when he tripped on concrete.  Recently has been seen by his cardiologist noted to have some lower extremity edema worse at baseline his torsemide was changed based on weight. And recommended that if his weight was above 203 pounds to take 80 mg torsemide which he did this AM continues to have some shortness of breath and abd distention on chest pain.    Regarding pertinent Chronic problems:    (EF 35%) and on May 31, 2018 implantation of a dual-chamber CRT-P device ([employer], Dr. Lovena Le)  A. afib on amiodarone anticoagulated with Eliquis on Coreg HX of presumed primary bronchogenic carcinoma of the central right upper lobe extending to the hilum and mediastinum. It was hypermetabolic on PET scan, unable to obtain tissue diagnosis despite undergoing biopsy finally was diagnosed with sarcoidosis.  While in ER: Noted to have significant laceration to his left hand with skin tear and fracture of the distal wrist patient is on Eliquis started on Ancef in ER updated tetanus Hand surgery Dr. Apolonio Schneiders has been  consulted. Plan to repair in AM after Cardiac clearance.    the following Work up has been ordered so far:  Orders Placed This Encounter  Procedures  . CT Head Wo Contrast  . DG Hand Complete Left  . DG Wrist Complete Left  . DG Finger Little Left  . DG Chest Portable 1 View  . CBC with Differential  . Basic metabolic panel  . Protime-INR  . Brain natriuretic peptide  . Brain natriuretic peptide  . Diet Heart Room service appropriate? Yes; Fluid consistency: Thin  . Irrigate wound  . Apply splint short arm  . Consult to hospitalist  . I-Stat Chem 8, ED  . EKG 12-Lead      Following Medications were ordered in ER: Medications  fentaNYL (SUBLIMAZE) injection 75 mcg (75 mcg Intravenous Given 08/11/18 1446)  Tdap (BOOSTRIX) injection 0.5 mL (0.5 mLs Intramuscular Given 08/11/18 1449)  ceFAZolin (ANCEF) IVPB 2g/100 mL premix (0 g Intravenous Stopped 08/11/18 1524)  lidocaine-EPINEPHrine (XYLOCAINE W/EPI) 2 %-1:200000 (PF) injection 10 mL (10 mLs Infiltration Given 08/11/18 1447)  fentaNYL (SUBLIMAZE) injection 100 mcg (100 mcg Intravenous Given 08/11/18 1740)    Significant initial  Findings: Abnormal Labs Reviewed  CBC WITH DIFFERENTIAL/PLATELET - Abnormal; Notable for the following components:      Result Value   RBC 3.47 (*)    Hemoglobin 9.6 (*)    HCT 30.8 (*)    Lymphs Abs 0.6 (*)    All other components within normal limits  BASIC METABOLIC PANEL - Abnormal; Notable for the following components:   Glucose, Bld 199 (*)    Creatinine, Ser 2.78 (*)    GFR calc non Af Amer 19 (*)    GFR calc Af Amer 22 (*)    All other components within normal limits  BRAIN NATRIURETIC PEPTIDE - Abnormal; Notable for the following components:   B Natriuretic Peptide 369.4 (*)    All other components within normal limits     Na 140 K 4.2  Cr   Up from baseline see below Lab Results  Component Value Date   CREATININE 2.78 (H) 08/11/2018   CREATININE 2.17 (H) 06/21/2018    CREATININE 2.00 (H) 05/24/2018    INR 1.10 BNP 369  WBC  8.5  HG/HCT 9.6  Down  from baseline see below    Component Value Date/Time   HGB 9.6 (L) 08/11/2018 1611   HGB 10.4 (L) 06/21/2018 1100   HCT 30.8 (L) 08/11/2018 1611   HCT 31.3 (L) 06/21/2018 1100    Troponin (Point of Care Test) No results for input(s): TROPIPOC in the last 72 hours.    BNP (last 3 results) Recent Labs    08/11/18 1611  BNP 369.4*    ProBNP (last 3 results) No results for input(s): PROBNP in the last 8760 hours.  Lactic Acid, Venous No results found for: LATICACIDVEN    UA  ordered  LEFT wrist Posteriorly displaced and probably comminuted distal right radial fracture. Moderately displaced ulnar styloid fracture.  CT HEAD Anterior frontal orbital soft tissue swelling CXR -  NON acute   ECG:  Personally reviewed by me showing: HR :  79 Rhythm:   Paced   no evidence of ischemic changes QTC 542     ED Triage Vitals  Enc Vitals Group     BP 08/11/18 1408 (!) 171/89     Pulse Rate 08/11/18 1408 74     Resp 08/11/18 1408 (!) 24     Temp 08/11/18 1408 98.1 F (36.7 C)     Temp Source 08/11/18 1408 Oral     SpO2 08/11/18 1406 97 %     Weight 08/11/18 1409 213 lb (96.6 kg)     Height 08/11/18 1409 6' (1.829 m)     Head Circumference --      Peak Flow --      Pain Score 08/11/18 1409 5     Pain Loc --      Pain Edu? --      Excl. in [place]? --   TMAX(24)@       Latest  Blood pressure (!) 154/69, pulse 77, temperature 98.1 F (36.7 C), temperature source Oral, resp. rate 18, height 6' (1.829 m), weight 96.6 kg, SpO2 93 %.   ER Provider Called:  [facility]   Dr.Ortman They Recommend admit to medicine undergo cardiac clearance prior to OR Will see in AM      Dr.Skains They Recommend call Cardiology in AM    Hospitalist was called for admission for degloving injury of left hand with wrist fracture   Review of Systems:    Pertinent positives include: fall,  Bilateral lower  extremity swelling    Constitutional:  No weight loss, night sweats, Fevers, chills, fatigue, weight loss  HEENT:  No headaches, Difficulty swallowing,Tooth/dental problems,Sore throat,  No sneezing, itching, ear ache, nasal congestion, post nasal drip,  Cardio-vascular:  No chest pain, Orthopnea, PND, anasarca, dizziness, palpitations.noGI:  No heartburn, indigestion, abdominal pain, nausea,  vomiting, diarrhea, change in bowel habits, loss of appetite, melena, blood in stool, hematemesis Resp:  no shortness of breath at rest. No dyspnea on exertion, No excess mucus, no productive cough, No non-productive cough, No coughing up of blood.No change in color of mucus.No wheezing. Skin:  no rash or lesions. No jaundice GU:  no dysuria, change in color of urine, no urgency or frequency. No straining to urinate.  No flank pain.  Musculoskeletal:  No joint pain or no joint swelling. No decreased range of motion. No back pain.  Psych:  No change in mood or affect. No depression or anxiety. No memory loss.  Neuro: no localizing neurological complaints, no tingling, no weakness, no double vision, no gait abnormality, no slurred speech, no confusion  All systems reviewed and apart from [place] all are negative  Past Medical History:   Past Medical History:  Diagnosis Date  . Cancer ([place])    lung  . Diabetes mellitus without complication ([place])   . Hyperlipidemia   . Hypertension   . Irregular heart rate    Dr. Victorino December note from 04/2017 states bradycardia, LBBB  . Pneumonia   . Renal insufficiency       Past Surgical History:  Procedure Laterality Date  . BIV PACEMAKER INSERTION CRT-P N/A 05/31/2018   Procedure: BIV PACEMAKER INSERTION CRT-P;  Surgeon: Evans Lance, MD;  Location: [facility];  Service: Cardiovascular;  Laterality: N/A;  . cyst removed    . ENDOBRONCHIAL ULTRASOUND Bilateral 03/23/2017   Procedure: ENDOBRONCHIAL ULTRASOUND;  Surgeon: Rigoberto Noel, MD;   Location: [facility];  Service: Cardiopulmonary;  Laterality: Bilateral;  . EYE SURGERY    . MEDIASTINOSCOPY N/A 04/08/2017   Procedure: MEDIASTINOSCOPY;  Surgeon: Grace Isaac, MD;  Location: [facility];  Service: Thoracic;  Laterality: N/A;  . MOUTH SURGERY    . ROTATOR CUFF REPAIR    . TEE WITHOUT CARDIOVERSION N/A 05/28/2018   Procedure: TRANSESOPHAGEAL ECHOCARDIOGRAM (TEE);  Surgeon: Sanda Klein, MD;  Location: [facility];  Service: Cardiovascular;  Laterality: N/A;  . tumor remoced from neck    . VIDEO BRONCHOSCOPY WITH ENDOBRONCHIAL ULTRASOUND N/A 04/08/2017   Procedure: VIDEO BRONCHOSCOPY WITH ENDOBRONCHIAL ULTRASOUND;  Surgeon: Grace Isaac, MD;  Location: [facility];  Service: Thoracic;  Laterality: N/A;  . VIDEO BRONCHOSCOPY WITH ENDOBRONCHIAL ULTRASOUND N/A 12/08/2017   Procedure: VIDEO BRONCHOSCOPY with BRONCHIAL BIOPSIES AND ENDOBRONCHIAL ULTRASOUND WITH TRANSBRONCHIAL AND BRONCHIAL BIOPSIES;  Surgeon: Grace Isaac, MD;  Location: [facility];  Service: Thoracic;  Laterality: N/A;    Social History:  Ambulatory  Independently      reports that he quit smoking about 20 months ago. His smoking use included cigarettes. He has a 5.30 pack-year smoking history. He has never used smokeless tobacco. He reports that he does not drink alcohol or use drugs.     Family History:   Family History  Problem Relation Age of Onset  . Kidney disease Mother   . Heart disease Father   . Cancer Son        prostate  . Suicidality Son     Allergies: No Known Allergies   Prior to Admission medications   Medication Sig Start Date End Date Taking? Authorizing Provider  acetaminophen (TYLENOL) 500 MG tablet Take 1,000 mg by mouth every 6 (six) hours as needed for moderate pain or headache.   Yes [provider]  amiodarone (PACERONE) 200 MG tablet Take 1 tablet (200 mg total) by mouth daily. 05/28/18  Yes Croitoru, Mihai, MD  apixaban (ELIQUIS) 2.5 MG TABS tablet Take 1  tablet (2.5 mg total) by mouth 2 (two) times daily. 05/18/18  Yes Chipper Herb, MD  carvedilol (COREG) 3.125 MG tablet Take 1 tablet (3.125 mg total) by mouth 2 (two) times daily. 06/01/18 06/01/19 Yes Baldwin Jamaica, PA-C  Cholecalciferol (VITAMIN D3) 5000 units CAPS Take 5,000 Units by mouth every evening.    Yes [provider]  glimepiride (AMARYL) 4 MG tablet TAKE 1 TABLET DAILY Patient taking differently: Take 4 mg by mouth daily with breakfast.  04/14/18  Yes Chipper Herb, MD  potassium chloride SA (K-DUR,KLOR-CON) 20 MEQ tablet Take 1-3 tablets (20-60 mEq total) by mouth daily as directed 08/09/18  Yes Croitoru, Mihai, MD  predniSONE (DELTASONE) 10 MG tablet TAKE (1) TABLET DAILY WITH BREAKFAST. Patient taking differently: Take 5 mg by mouth daily.  04/23/18  Yes Rigoberto Noel, MD  torsemide (DEMADEX) 20 MG tablet Take 1-4 tablets (20-80 mg total) by mouth daily as directed. 08/09/18  Yes Croitoru, Mihai, MD  glucose blood (ONETOUCH VERIO) test strip Twice Daily 01/25/18   Chipper Herb, MD   Physical Exam: Blood pressure (!) 154/69, pulse 77, temperature 98.1 F (36.7 C), temperature source Oral, resp. rate 18, height 6' (1.829 m), weight 96.6 kg, SpO2 93 %. 1. General:  in No Acute distress  well  -appearing 2. Psychological: Alert and  Oriented 3. Head/ENT:   Moist   Mucous Membranes                          Head   Traumatic bruise over left eye, neck supple                           Poor Dentition 4. SKIN: normal   Skin turgor,  Skin clean Dry left arm in cast 5. Heart: Regular rate and rhythm no  Murmur, no Rub or gallop 6. Lungs: some  wheezes no  crackles   7. Abdomen: Soft, non-tender distended  obese  bowel sounds present 8. Lower extremities: no clubbing, cyanosis, or 2+edema 9. Neurologically Grossly intact, moving all 4 extremities equally   10. MSK: Normal range of motion   LABS:     Recent Labs  Lab 08/11/18 1611  WBC 8.5  NEUTROABS 7.1  HGB 9.6*    HCT 30.8*  MCV 88.8  PLT 482   Basic Metabolic Panel: Recent Labs  Lab 08/11/18 1611  NA 140  K 4.2  CL 104  CO2 27  GLUCOSE 199*  BUN 23  CREATININE 2.78*  CALCIUM 9.0      No results for input(s): AST, ALT, ALKPHOS, BILITOT, PROT, ALBUMIN in the last 168 hours. No results for input(s): LIPASE, AMYLASE in the last 168 hours. No results for input(s): AMMONIA in the last 168 hours.    HbA1C: No results for input(s): HGBA1C in the last 72 hours. CBG: No results for input(s): GLUCAP in the last 168 hours.    Urine analysis:    Component Value Date/Time   APPEARANCEUR Clear 07/13/2018 1526   GLUCOSEU Trace (A) 07/13/2018 1526   BILIRUBINUR Negative 07/13/2018 1526   PROTEINUR 3+ (A) 07/13/2018 1526   NITRITE Negative 07/13/2018 1526   LEUKOCYTESUR Negative 07/13/2018 1526       Cultures:    Component Value Date/Time   SDES BRONCHIAL WASHINGS RIGHT LUNG 12/08/2017 0800  SPECREQUEST POF ZINACEF 12/08/2017 0800   CULT NO GROWTH 2 DAYS 12/08/2017 0800   REPTSTATUS 12/10/2017 FINAL 12/08/2017 0800     Radiological Exams on Admission: Dg Wrist Complete Left  Result Date: 08/11/2018 CLINICAL DATA:  Left wrist pain after fall today. EXAM: LEFT WRIST - COMPLETE 3+ VIEW COMPARISON:  None. FINDINGS: Moderately displaced ulnar styloid fracture is noted. Posteriorly displaced and probably comminuted fracture of distal right radius is noted. Vascular calcifications are noted. IMPRESSION: Posteriorly displaced and probably comminuted distal right radial fracture. Moderately displaced ulnar styloid fracture. Electronically Signed   By: [name], M.D.   On: 08/11/2018 15:45   Ct Head Wo Contrast  Result Date: 08/11/2018 CLINICAL DATA:  Fall, laceration and hematoma over the left frontal orbital region. EXAM: CT HEAD WITHOUT CONTRAST TECHNIQUE: Contiguous axial images were obtained from the base of the skull through the vertex without intravenous contrast. COMPARISON:   None. FINDINGS: Brain: Advanced brain atrophy and chronic white matter microvascular changes with ventricular enlargement. No acute intracranial hemorrhage, mass lesion, definite new infarction, midline shift, herniation, hydrocephalus, or extra-axial fluid collection. No focal mass effect or edema. Cisterns are patent. Cerebellar atrophy as well. Vascular: Intracranial atherosclerosis noted.  No hyperdense vessel. Skull: Intact skull. Anterior left frontal orbital scalp bruising/hematoma noted. Sinuses/Orbits: Sinuses remain clear. No other orbital abnormality. No proptosis. Other: None. IMPRESSION: Anterior frontal orbital soft tissue swelling/bruising. Atrophy and chronic white matter microvascular changes. No acute intracranial finding by noncontrast CT. Electronically Signed   By: Jerilynn Mages.  Shick M.D.   On: 08/11/2018 15:32   Dg Chest Portable 1 View  Result Date: 08/11/2018 CLINICAL DATA:  84 year old who fell onto concrete earlier today sustaining lacerations to the head and LEFT hand. Current history of RIGHT UPPER LOBE lung cancer. EXAM: PORTABLE CHEST 1 VIEW COMPARISON:  CT chest 07/02/2018, 04/15/2018 and earlier. Chest x-rays 06/01/2018, 02/23/2018 and earlier. FINDINGS: Cardiac silhouette markedly enlarged, unchanged. LEFT subclavian biventricular pacemaker unchanged and appears intact. Thoracic aorta atherosclerotic, unchanged. RIGHT hilar/suprahilar mass, not significantly changed since the CT 07/02/2018. Post radiation fibrosis and bronchiectasis in the RIGHT UPPER LOBE which has shown further evolution since that CT. No new pulmonary parenchymal abnormalities in either lung. Emphysematous changes throughout both lungs as noted previously. IMPRESSION: 1.  No acute cardiopulmonary disease. 2. RIGHT hilar/suprahilar mass which is essentially stable since the CT 07/02/2018. 3. Evolving post radiation fibrosis involving the RIGHT UPPER LOBE. Aortic Atherosclerosis (ICD10-I70.0) and Emphysema (ICD10-J43.9).  Electronically Signed   By: Evangeline Dakin M.D.   On: 08/11/2018 18:22   Dg Hand Complete Left  Result Date: 08/11/2018 CLINICAL DATA:  Fall EXAM: LEFT HAND - COMPLETE 3+ VIEW COMPARISON:  None. FINDINGS: Osteopenia. There is deformity at the base of the proximal phalanx of the small finger. In impaction type fracture is not excluded. There is also deformity of the distal radius that is poorly visualized worrisome for a comminuted distal radius fracture. IMPRESSION: Possible fracture at the base of the proximal phalanx of the small finger. Dedicated small finger views may be helpful. Comminuted displaced fracture of the distal radius is suspected. A wrist radiograph series is recommended. Electronically Signed   By: Marybelle Killings M.D.   On: 08/11/2018 15:07   Dg Finger Little Left  Result Date: 08/11/2018 CLINICAL DATA:  Left fifth finger pain after fall. EXAM: LEFT LITTLE FINGER 2+V COMPARISON:  Radiographs of same day. FINDINGS: Probable nondisplaced fracture is seen involving the proximal base of the fifth proximal phalanx. Joint spaces  are unremarkable. IMPRESSION: Probable nondisplaced fracture involving proximal base of fifth proximal phalanx. Electronically Signed   By: [name], M.D.   On: 08/11/2018 15:47    Chart has been reviewed    Assessment/Plan  84 y.o. male with medical history significant of long-standing asymptomatic bradycardia, pulmonary and mediastinal sarcoidosis, pulmonary and mediastinal sarcoidosis,type 2 diabetes mellitus, systolic CHF  right upper lobe radiation pneumonitis. CKD stage IV cr at baseline 2-2.5, HTN, LBBB  Admitted for fall resulting in left wrist fracture in association with acute on chronic systolic CHF.   Present on Admission: . Acute on chroinc systolic heart failure ([place]) -  - admit on telemetry,  cycle cardiac enzymes,   obtain serial ECG  to evaluate for ischemia as a cause of heart failure  monitor daily weight:  Filed Weights    08/11/18 1409  Weight: 96.6 kg   Last BNP BNP (last 3 results) Recent Labs    08/11/18 1611  BNP 369.4*      diurese with IV lasix and monitor orthostatics and creatinine to avoid over diuresis.  Order echogram to evaluate EF and valves  ACE/ARBi Contraindicated    cardiology consulted in AM  . Anemia in stage 4 chronic kidney disease ([place]) -stable continue to monitor ordered type and screen in case patient needs transfusion . Biventricular automatic implantable cardioverter defibrillator in situ -anesthesiology will need to be aware of this prior to proceeding to OR, cardiology consult for preop clearance . CKD (chronic kidney disease) stage 4, GFR 15-29 ml/min (HCC) currently stable avoid nephrotoxic medications . Essential hypertension stable resume home medications  . Hypercholesterolemia stable continue home medications . Paroxysmal atrial fibrillation (HCC) -           - CHA2DS2 vas score 6 :  current anticoagulation   Eliquis on Hold for need for operative intervention.  Given significantly elevated score if unable to operate in the next few days will need to start bridging hold off for tonight until clear plan on the date for operative intervention     -  Rate control:  Currently controlled with  Coreg will continue        - Rhythm control:  Continue amiodarone  . Sarcoidosis - we will continue prednisone.  Patient has chronic mild wheezing which is secondary to sarcoidosis  . Type 2 diabetes mellitus with stage 3 chronic kidney disease, without long-term current use of insulin (HCC) -  - Order Sensitive   SSI    -  check TSH and HgA1C  - Hold by mouth medications    . Left wrist fracture, closed, initial encounter  - orthopedics aware wound has been stapled in place in splint would like to operate in the next 1 to 2 days would like to have cardiology clearance prior to proceeding hold Eliquis for now . Degloving injury of left hand as per orthopedics   Other plan as per  orders.  DVT prophylaxis:  SCD   Code Status:  FULL CODE  as per patient   I had personally discussed CODE STATUS with patient and family    Family Communication:   Family  at  Bedside  plan of care was discussed with  Son,  Disposition Plan:        To home once workup is complete and patient is stable                     Would benefit from PT/OT eval prior to  DC  Ordered                                       Consults called: Cardiology and Hand Surgery    Admission status:    inpatient     Expect 2 midnight stay secondary to severity of patient's current illness including     and extensive comorbidities including:    DM2    CHF     CKD History of pulmonary disease That are currently affecting medical management.  I expect  patient to be hospitalized for 2 midnights requiring inpatient medical care.  Patient is at high risk for adverse outcome (such as loss of life or disability) if not treated.  Indication for inpatient stay as follows:    Need for operative intervention Need for IV medications     Level of care    tele  For  24H  Dx of acute CHF     Tyse Auriemma 08/11/2018, 8:06 PM    [facility]  Pager [phone]   after 2 AM please page floor coverage PA If 7AM-7PM, please contact the day team taking care of the patient  [email or web address]  Password [email or web address]

## 2018-08-11 NOTE — ED Notes (Signed)
Ortho tech paged  

## 2018-08-11 NOTE — Telephone Encounter (Signed)
-----   Message from Sanda Klein, MD sent at 08/10/2018 10:56 AM EDT ----- He needs to have a repeat BMET, preferably a couple of days before his follow-up appointment with APP (unless he goes back to see Dr. Joelyn Oms in the meantime) [facility]

## 2018-08-11 NOTE — ED Provider Notes (Signed)
Care transferred to me.  Orthopedics is seen and wound will need to be cleaned.  Orthopedics is also stapled wound and placed in splint.  They would like to operate and fix in the next 1-2 days.  Requesting medicine admission given his complex cardiac history as well.  He has been having some increased pedal edema and was increased on his torsemide recently.  Discussed with Dr. Roel Cluck who will admit.  I have consulted cardiology, Dr Marlou Porch, for cardiac clearance, recommends hospitalist consult cards in am.   Sherwood Gambler, MD 08/11/18 2330

## 2018-08-11 NOTE — Consult Note (Addendum)
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  HPI: Ruben Conner suffered a mechanical fall today at home. He was brought to the ED where x-rays showed a left little finger and wrist fx as well as a dorsal degloving. Hand surgery was consulted. He also suffered a head laceration. He was recently seen by his cardiologist and noted to be quite hypervolemic. He is on Eliquis for afib. He is RHD.  Past Medical History:  Diagnosis Date  . Cancer ([place])    lung  . Diabetes mellitus without complication ([place])   . Hyperlipidemia   . Hypertension   . Irregular heart rate    Dr. Victorino December note from 04/2017 states bradycardia, LBBB  . Pneumonia   . Renal insufficiency     Past Surgical History:  Procedure Laterality Date  . BIV PACEMAKER INSERTION CRT-P N/A 05/31/2018   Procedure: BIV PACEMAKER INSERTION CRT-P;  Surgeon: Evans Lance, MD;  Location: [facility];  Service: Cardiovascular;  Laterality: N/A;  . cyst removed    . ENDOBRONCHIAL ULTRASOUND Bilateral 03/23/2017   Procedure: ENDOBRONCHIAL ULTRASOUND;  Surgeon: Rigoberto Noel, MD;  Location: [facility];  Service: Cardiopulmonary;  Laterality: Bilateral;  . EYE SURGERY    . MEDIASTINOSCOPY N/A 04/08/2017   Procedure: MEDIASTINOSCOPY;  Surgeon: Grace Isaac, MD;  Location: [facility];  Service: Thoracic;  Laterality: N/A;  . MOUTH SURGERY    . ROTATOR CUFF REPAIR    . TEE WITHOUT CARDIOVERSION N/A 05/28/2018   Procedure: TRANSESOPHAGEAL ECHOCARDIOGRAM (TEE);  Surgeon: Sanda Klein, MD;  Location: [facility];  Service: Cardiovascular;  Laterality: N/A;  . tumor remoced from neck    . VIDEO BRONCHOSCOPY WITH ENDOBRONCHIAL ULTRASOUND N/A 04/08/2017   Procedure: VIDEO BRONCHOSCOPY WITH ENDOBRONCHIAL ULTRASOUND;  Surgeon: Grace Isaac, MD;  Location: [facility];  Service: Thoracic;  Laterality: N/A;  . VIDEO BRONCHOSCOPY WITH ENDOBRONCHIAL ULTRASOUND N/A 12/08/2017   Procedure: VIDEO BRONCHOSCOPY with BRONCHIAL BIOPSIES AND ENDOBRONCHIAL ULTRASOUND WITH TRANSBRONCHIAL AND BRONCHIAL BIOPSIES;  Surgeon: Grace Isaac, MD;  Location: [facility];  Service: Thoracic;  Laterality: N/A;    Family History  Problem Relation Age of Onset  . Kidney disease Mother   . Heart disease Father   . Cancer Son        prostate  . Suicidality Son     Social History:  reports that he quit smoking about 20 months ago. His smoking use included cigarettes. He has a 5.30 pack-year smoking history. He has never used smokeless tobacco. He reports that he does not drink alcohol or use drugs.  Allergies: No Known Allergies  Medications: I have reviewed the patient's current medications.  No results found for this or any previous visit (from the past 48 hour(s)).  Dg Wrist Complete Left  Result Date: 08/11/2018 CLINICAL DATA:  Left wrist pain after fall today. EXAM: LEFT WRIST - COMPLETE 3+ VIEW COMPARISON:  None. FINDINGS: Moderately displaced ulnar styloid fracture is noted. Posteriorly displaced and probably comminuted fracture of distal right radius is noted. Vascular calcifications are noted. IMPRESSION: Posteriorly displaced and probably comminuted distal right radial fracture. Moderately displaced ulnar styloid fracture. Electronically Signed   By: Ruben Conner, M.D.   On: 08/11/2018 15:45   Ct Head Wo Contrast  Result Date: 08/11/2018 CLINICAL DATA:  Fall, laceration and hematoma over the left frontal orbital region. EXAM: CT HEAD WITHOUT CONTRAST TECHNIQUE: Contiguous axial images were obtained from the  base of the skull through the vertex without intravenous contrast. COMPARISON:  None. FINDINGS: Brain: Advanced brain atrophy and chronic white matter microvascular changes with ventricular enlargement. No acute intracranial hemorrhage, mass lesion, definite new infarction, midline shift, herniation, hydrocephalus, or extra-axial fluid collection. No focal mass effect or  edema. Cisterns are patent. Cerebellar atrophy as well. Vascular: Intracranial atherosclerosis noted.  No hyperdense vessel. Skull: Intact skull. Anterior left frontal orbital scalp bruising/hematoma noted. Sinuses/Orbits: Sinuses remain clear. No other orbital abnormality. No proptosis. Other: None. IMPRESSION: Anterior frontal orbital soft tissue swelling/bruising. Atrophy and chronic white matter microvascular changes. No acute intracranial finding by noncontrast CT. Electronically Signed   By: Ruben Conner.  Shick M.D.   On: 08/11/2018 15:32   Dg Hand Complete Left  Result Date: 08/11/2018 CLINICAL DATA:  Fall EXAM: LEFT HAND - COMPLETE 3+ VIEW COMPARISON:  None. FINDINGS: Osteopenia. There is deformity at the base of the proximal phalanx of the small finger. In impaction type fracture is not excluded. There is also deformity of the distal radius that is poorly visualized worrisome for a comminuted distal radius fracture. IMPRESSION: Possible fracture at the base of the proximal phalanx of the small finger. Dedicated small finger views may be helpful. Comminuted displaced fracture of the distal radius is suspected. A wrist radiograph series is recommended. Electronically Signed   By: Marybelle Killings M.D.   On: 08/11/2018 15:07   Dg Finger Little Left  Result Date: 08/11/2018 CLINICAL DATA:  Left fifth finger pain after fall. EXAM: LEFT LITTLE FINGER 2+V COMPARISON:  Radiographs of same day. FINDINGS: Probable nondisplaced fracture is seen involving the proximal base of the fifth proximal phalanx. Joint spaces are unremarkable. IMPRESSION: Probable nondisplaced fracture involving proximal base of fifth proximal phalanx. Electronically Signed   By: Ruben Conner, M.D.   On: 08/11/2018 15:47    Review of Systems  Constitutional: Negative for weight loss.  HENT: Negative for ear discharge, ear pain, hearing loss and tinnitus.   Eyes: Negative for blurred vision, double vision, photophobia and pain.  Respiratory:  Negative for cough, sputum production and shortness of breath.   Cardiovascular: Negative for chest pain.  Gastrointestinal: Negative for abdominal pain, nausea and vomiting.  Genitourinary: Negative for dysuria, flank pain, frequency and urgency.  Musculoskeletal: Positive for joint pain (Left hand). Negative for back pain, falls, myalgias and neck pain.  Neurological: Negative for dizziness, tingling, sensory change, focal weakness, loss of consciousness and headaches.  Endo/Heme/Allergies: Does not bruise/bleed easily.  Psychiatric/Behavioral: Negative for depression, memory loss and substance abuse. The patient is not nervous/anxious.    Blood pressure (!) 154/83, pulse 73, temperature 98.1 F (36.7 C), temperature source Oral, resp. rate 15, height 6' (1.829 m), weight 96.6 kg, SpO2 94 %. Physical Exam  Constitutional: He appears well-developed and well-nourished. No distress.  HENT:  Head: Normocephalic.  Eyes: Conjunctivae are normal. Right eye exhibits no discharge. Left eye exhibits no discharge. No scleral icterus.  Neck: Normal range of motion.  Cardiovascular: Normal rate and regular rhythm.  Respiratory: Effort normal. No respiratory distress.  Musculoskeletal:  Left shoulder, elbow, wrist, digits- Dorsal laceration from base of little finger across hand, mod TTP, unable to extend little finger, no instability  Sens  Ax/R/M/U intact  Mot   Ax/ R/ PIN/ M/ AIN/ U intact  Rad 2+  Neurological: He is alert.  Skin: Skin is warm and dry. He is not diaphoretic.  Psychiatric: He has a normal mood and affect. His behavior is normal.  Assessment/Plan: Left hand degloving with 5th extensor tendon injury -- Will need I&D, reconstruction Left wrist fx -- Will need ORIF Left little finger open P1 fx Scalp laceration DM, Afib on anticoagulation, CHF, CKD, sarcoidosis, cardiomyopathy, HLD  Wound cleaned and partially closed at bedside Splinted at bedside Will plan for ORIF of  left wrist and laceration repair to dorsum of hand on 9/27 in pm or 9/28 in am Admit to medicine Await cardiac/medical clearance Ice elevate nwb left wrist Pt/seen and examined at bedside in ED and care discussed with son at bedside.  Iran Planas MD     Lisette Abu, PA-C Orthopedic Surgery [phone] 08/11/2018, 3:59 PM

## 2018-08-12 ENCOUNTER — Other Ambulatory Visit (HOSPITAL_COMMUNITY): Payer: PPO

## 2018-08-12 ENCOUNTER — Encounter (HOSPITAL_COMMUNITY): Payer: Self-pay | Admitting: General Practice

## 2018-08-12 ENCOUNTER — Other Ambulatory Visit: Payer: Self-pay

## 2018-08-12 DIAGNOSIS — I5021 Acute systolic (congestive) heart failure: Secondary | ICD-10-CM

## 2018-08-12 DIAGNOSIS — N184 Chronic kidney disease, stage 4 (severe): Secondary | ICD-10-CM

## 2018-08-12 DIAGNOSIS — Z95810 Presence of automatic (implantable) cardiac defibrillator: Secondary | ICD-10-CM

## 2018-08-12 DIAGNOSIS — W19XXXA Unspecified fall, initial encounter: Secondary | ICD-10-CM

## 2018-08-12 LAB — CBC
HCT: 29.7 % — ABNORMAL LOW (ref 39.0–52.0)
HEMOGLOBIN: 9.3 g/dL — AB (ref 13.0–17.0)
MCH: 27.8 pg (ref 26.0–34.0)
MCHC: 31.3 g/dL (ref 30.0–36.0)
MCV: 88.7 fL (ref 78.0–100.0)
Platelets: 195 10*3/uL (ref 150–400)
RBC: 3.35 MIL/uL — ABNORMAL LOW (ref 4.22–5.81)
RDW: 13.9 % (ref 11.5–15.5)
WBC: 7.8 10*3/uL (ref 4.0–10.5)

## 2018-08-12 LAB — COMPREHENSIVE METABOLIC PANEL
ALBUMIN: 3.1 g/dL — AB (ref 3.5–5.0)
ALK PHOS: 52 U/L (ref 38–126)
ALT: 10 U/L (ref 0–44)
ANION GAP: 9 (ref 5–15)
AST: 16 U/L (ref 15–41)
BILIRUBIN TOTAL: 0.7 mg/dL (ref 0.3–1.2)
BUN: 23 mg/dL (ref 8–23)
CALCIUM: 8.4 mg/dL — AB (ref 8.9–10.3)
CO2: 25 mmol/L (ref 22–32)
Chloride: 105 mmol/L (ref 98–111)
Creatinine, Ser: 2.71 mg/dL — ABNORMAL HIGH (ref 0.61–1.24)
GFR, EST AFRICAN AMERICAN: 23 mL/min — AB (ref >60)
GFR, EST NON AFRICAN AMERICAN: 20 mL/min — AB (ref >60)
GLUCOSE: 127 mg/dL — AB (ref 70–99)
Potassium: 3.9 mmol/L (ref 3.5–5.1)
Sodium: 139 mmol/L (ref 135–145)
TOTAL PROTEIN: 6 g/dL — AB (ref 6.5–8.1)

## 2018-08-12 LAB — TROPONIN I: Troponin I: 0.05 ng/mL (ref <0.03)

## 2018-08-12 LAB — GLUCOSE, CAPILLARY
GLUCOSE-CAPILLARY: 139 mg/dL — AB (ref 70–99)
GLUCOSE-CAPILLARY: 168 mg/dL — AB (ref 70–99)
GLUCOSE-CAPILLARY: 230 mg/dL — AB (ref 70–99)
Glucose-Capillary: 119 mg/dL — ABNORMAL HIGH (ref 70–99)
Glucose-Capillary: 230 mg/dL — ABNORMAL HIGH (ref 70–99)

## 2018-08-12 LAB — PHOSPHORUS: PHOSPHORUS: 3.8 mg/dL (ref 2.5–4.6)

## 2018-08-12 LAB — TSH: TSH: 2.331 u[IU]/mL (ref 0.350–4.500)

## 2018-08-12 LAB — HEMOGLOBIN A1C
HEMOGLOBIN A1C: 7.5 % — AB (ref 4.8–5.6)
MEAN PLASMA GLUCOSE: 168.55 mg/dL

## 2018-08-12 LAB — MAGNESIUM: MAGNESIUM: 1.5 mg/dL — AB (ref 1.7–2.4)

## 2018-08-12 MED ORDER — FUROSEMIDE 10 MG/ML IJ SOLN: 40.0000 mg | Freq: Two times a day (BID) | INTRAMUSCULAR | Status: DC

## 2018-08-12 MED ORDER — FUROSEMIDE 10 MG/ML IJ SOLN
60.0000 mg | Freq: Two times a day (BID) | INTRAMUSCULAR | Status: DC
  Administered 2018-08-12 (×2): 60 mg via INTRAVENOUS
  Filled 2018-08-12 (×2): qty 6

## 2018-08-12 MED ORDER — POLYETHYLENE GLYCOL 3350 17 G PO PACK
17.0000 g | PACK | Freq: Every day | ORAL | Status: DC
  Administered 2018-08-12: 17 g via ORAL
  Filled 2018-08-12: qty 1

## 2018-08-12 MED ORDER — MAGNESIUM SULFATE 2 GM/50ML IV SOLN
2.0000 g | Freq: Once | INTRAVENOUS | Status: AC
  Administered 2018-08-12: 2 g via INTRAVENOUS
  Filled 2018-08-12: qty 50

## 2018-08-12 MED ORDER — ZOLPIDEM TARTRATE 5 MG PO TABS
5.0000 mg | ORAL_TABLET | Freq: Every evening | ORAL | Status: DC | PRN
  Administered 2018-08-12: 5 mg via ORAL
  Filled 2018-08-12: qty 1

## 2018-08-12 MED ORDER — DEXTROMETHORPHAN POLISTIREX ER 30 MG/5ML PO SUER
15.0000 mg | Freq: Two times a day (BID) | ORAL | Status: DC | PRN
  Administered 2018-08-12 – 2018-08-16 (×6): 15 mg via ORAL
  Filled 2018-08-12 (×7): qty 5

## 2018-08-12 NOTE — Evaluation (Signed)
Physical Therapy Evaluation Patient Details Name: Ruben Conner, Ruben Conner MRN: [id number] DOB: [birth year] Today's Date: 08/12/2018   History of Present Illness  [name] is a 84 year old male with chronic systolic heart failure, dual-chamber CRT-P device, [employer], Dr. Lovena Le, with EF of 35%, 98% biventricular pacing with no atrial fibrillation who previously received primary bronchogenic carcinoma therapy central right upper lobe with radiation, chronic kidney disease 2.7 creatinine, dementia of his wife, [name], who unfortunately suffered a fall and injured hand, wrist significantly requiring surgical repair by Dr. Apolonio Schneiders.  Pt reports his knees gave out on him causing him to fall.   Clinical Impression  Pt admitted with above diagnosis. Pt currently with functional limitations due to the deficits listed below (see PT Problem List). Pt was able to stand and pivot with mod assist and cues.  Pt weak and now cannot use his left UE for mobility.  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  Will follow acutely.   Pt will benefit from skilled PT to increase their independence and safety with mobility to allow discharge to the venue listed below.      Follow Up Recommendations SNF;Supervision/Assistance - 24 hour(wants to go home and hire caregiver, wife disabled too)    Equipment Recommendations  (TBA)    Recommendations for Other Services       Precautions / Restrictions Precautions Precautions: Fall Restrictions Weight Bearing Restrictions: Yes LUE Weight Bearing: Non weight bearing Other Position/Activity Restrictions: NWB wrist      Mobility  Bed Mobility Overal bed mobility: Needs Assistance Bed Mobility: Rolling;Sidelying to Sit Rolling: Mod assist Sidelying to sit: Mod assist Supine to sit: Mod assist     General bed mobility comments: Pt needed mod assist to come to eOB with pt pulling up on PT.  He could not push up enough on left elbow to come all the  way to sitting without assist.  Transfers Overall transfer level: Needs assistance Equipment used: 1 person hand held assist Transfers: Sit to/from Stand;Stand Pivot Transfers Sit to Stand: Min assist;From elevated surface Stand pivot transfers: Mod assist;Min assist;From elevated surface       General transfer comment: Pt needed min assist to power up and steady and mod assist for stand pivot for stability as pt with poor postural stability in standing and only weight bearing on right UE.   Ambulation/Gait                Stairs            Wheelchair Mobility    Modified Rankin (Stroke Patients Only)       Balance Overall balance assessment: Needs assistance Sitting-balance support: No upper extremity supported;Feet supported Sitting balance-Leahy Scale: Fair     Standing balance support: Single extremity supported;During functional activity Standing balance-Leahy Scale: Poor Standing balance comment: relies on UE and external support for balance.                              Pertinent Vitals/Pain Pain Assessment: Faces Faces Pain Scale: Hurts little more Pain Location: left wrist Pain Descriptors / Indicators: Aching;Grimacing;Guarding Pain Intervention(s): Limited activity within patient's tolerance;Monitored during session;Repositioned    Home Living Family/patient expects to be discharged to:: Private residence Living Arrangements: Spouse/significant other Available Help at Discharge: Family;Available PRN/intermittently(pt caregiver for wife, has a woman who helps 8 hours day) Type of Home: House Home Access: Stairs to  enter   Entrance Stairs-Number of Steps: 1 Home Layout: One level Home Equipment: Cane - single point;[place] - 2 wheels;Bedside commode Additional Comments: Pt normally is caregiver for wife.  He cooks, cleans, and has to provide HHA for her when walking.  Daughter in law is helping currently.  He has had a lady who comes  8 hours day to assist them as well.      Prior Function Level of Independence: Independent               Hand Dominance        Extremity/Trunk Assessment   Upper Extremity Assessment Upper Extremity Assessment: Defer to OT evaluation    Lower Extremity Assessment Lower Extremity Assessment: Generalized weakness    Cervical / Trunk Assessment Cervical / Trunk Assessment: Kyphotic  Communication   Communication: No difficulties  Cognition Arousal/Alertness: Awake/alert Behavior During Therapy: Flat affect Overall Cognitive Status: Within Functional Limits for tasks assessed                                        General Comments General comments (skin integrity, edema, etc.): Placed left UE on pillow for edema control.     Exercises General Exercises - Lower Extremity Ankle Circles/Pumps: AROM;Both;10 reps;Supine Long Arc Quad: AROM;Both;10 reps;Seated   Assessment/Plan    PT Assessment Patient needs continued PT services  PT Problem List Decreased balance;Decreased activity tolerance;Decreased mobility;Decreased range of motion;Decreased strength;Decreased knowledge of use of DME;Decreased safety awareness;Decreased knowledge of precautions;Pain       PT Treatment Interventions DME instruction;Gait training;Stair training;Functional mobility training;Therapeutic activities;Therapeutic exercise;Balance training;Patient/family education    PT Goals (Current goals can be found in the Care Plan section)  Acute Rehab PT Goals Patient Stated Goal: to go home and be able to get around PT Goal Formulation: With patient Time For Goal Achievement: 08/26/18 Potential to Achieve Goals: Good    Frequency Min 3X/week   Barriers to discharge Decreased caregiver support(caregiver for wife)      Co-evaluation               AM-PAC PT "6 Clicks" Daily Activity  Outcome Measure Difficulty turning over in bed (including adjusting bedclothes, sheets  and blankets)?: Unable Difficulty moving from lying on back to sitting on the side of the bed? : Unable Difficulty sitting down on and standing up from a chair with arms (e.g., wheelchair, bedside commode, etc,.)?: Unable Help needed moving to and from a bed to chair (including a wheelchair)?: A Lot Help needed walking in hospital room?: A Lot Help needed climbing 3-5 steps with a railing? : A Lot 6 Click Score: 9    End of Session Equipment Utilized During Treatment: Gait belt Activity Tolerance: Patient limited by fatigue Patient left: in chair;with call bell/phone within reach;with chair alarm set Nurse Communication: Mobility status PT Visit Diagnosis: Unsteadiness on feet (R26.81);Muscle weakness (generalized) (M62.81);Pain;History of falling (Z91.81) Pain - Right/Left: Left Pain - part of body: (wrist)    Time: 0630-1601 PT Time Calculation (min) (ACUTE ONLY): 25 min   Charges:   PT Evaluation $PT Eval Moderate Complexity: 1 Mod PT Treatments $Therapeutic Activity: 8-22 mins        Rayneisha Bouza,PT Acute Rehabilitation Services Pager:  [phone]  Office:  [place] 08/12/2018, 11:01 AM

## 2018-08-12 NOTE — Telephone Encounter (Signed)
Called patient daughter in law back, due to her not being on DPR unable to give information. She gave me number to hospital where is at currently to discuss message.  I called patient, spoke with him advised that Dr.Crotioru wanted him to have blood work before his appointment, he is currently admitted and they do have this lab ordered as of now. He did give permission to talk to Opal Sidles his daughter law, but I did advised for him to sign a DPR again with her name attached as well, patient verbalized understanding.

## 2018-08-12 NOTE — Progress Notes (Signed)
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   MRN: [id number]   LOS: 1 day   Subjective: Doing well this morning, hand pain has settled down.   Objective: Vital signs in last 24 hours: Temp:  [97.5 F (36.4 C)-98.5 F (36.9 C)] 97.5 F (36.4 C) (09/26 0501) Pulse Rate:  [52-92] 52 (09/26 0501) Resp:  [10-24] 17 (09/26 0501) BP: (130-171)/(66-94) 138/74 (09/26 0501) SpO2:  [87 %-97 %] 92 % (09/26 0501) Weight:  [96.6 kg-96.9 kg] 96.9 kg (09/26 0013)     Laboratory  CBC Recent Labs    08/11/18 1611 08/12/18 0531  WBC 8.5 7.8  HGB 9.6* 9.3*  HCT 30.8* 29.7*  PLT 245 195   BMET Recent Labs    08/11/18 1611 08/12/18 0531  NA 140 139  K 4.2 3.9  CL 104 105  CO2 27 25  GLUCOSE 199* 127*  BUN 23 23  CREATININE 2.78* 2.71*  CALCIUM 9.0 8.4*     Physical Exam General appearance: alert and no distress  Left hand: Splinted, cap refill <2s, no sensory deficits   Assessment/Plan: Left hand degloving with little finger extensor tendon injury, likely P1 fx Left wrist fx ----- For I&D, ORIF, and reconstruction when cleared by medicine and cardiology. Hopeful for tomorrow, will make NPO after MN.  Multiple medical problems -- per primary    Lisette Abu, PA-C Orthopedic Surgery [phone] 08/12/2018

## 2018-08-12 NOTE — Consult Note (Signed)
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 Apolonio Schneiders.  History of Present Illness:   Mr. Roedl is a 84 year old male with chronic systolic heart failure, dual-chamber CRT-P device, [employer], Dr. Lovena Le, with EF of 35%, 98% biventricular pacing with no atrial fibrillation who previously received primary bronchogenic carcinoma therapy central right upper lobe with radiation, chronic kidney disease 2.7 creatinine, dementia of his wife, Ruben Conner, who unfortunately suffered a fall and injured hand, wrist significantly requiring surgical repair by Dr. Apolonio Schneiders.  He was recently seen in our heart care clinic by Dr. Lorenza Cambridge on 08/09/2018 and felt to be hypervolemic at that time.  Dry weight seems to be 203 pounds.  Shortly after CRT therapy he improved but volume retention had returned.  He has a weight-based torsemide/potassium prescription plan.  Overnight, he did receive some IV Lasix to assist with diuresis.  Seems to be quite comfortable.  No chest pain.  He does not have any significant valvular abnormalities.  No detectable atrial fibrillation since device implantation.  Eliquis has been held.  He stated that he did not have syncope.  Mechanical fall.  His knees gave out on him secondary to arthritis.  Currently he feels as though his breathing is at baseline.  His family members are helping to take care of his wife Ruben Conner.    Past Medical History:  Diagnosis Date  . Cancer ([place])    lung  . Diabetes mellitus without complication ([place])   . Hyperlipidemia   . Hypertension   .  Irregular heart rate    Dr. Victorino December note from 04/2017 states bradycardia, LBBB  . Pneumonia   . Renal insufficiency     Past Surgical History:  Procedure Laterality Date  . BIV PACEMAKER INSERTION CRT-P N/A 05/31/2018   Procedure: BIV PACEMAKER INSERTION CRT-P;  Surgeon: Evans Lance, MD;  Location: [facility];  Service: Cardiovascular;  Laterality: N/A;  . cyst removed    . ENDOBRONCHIAL ULTRASOUND Bilateral 03/23/2017   Procedure: ENDOBRONCHIAL ULTRASOUND;  Surgeon: Rigoberto Noel, MD;  Location: [facility];  Service: Cardiopulmonary;  Laterality: Bilateral;  . EYE SURGERY    . MEDIASTINOSCOPY N/A 04/08/2017   Procedure: MEDIASTINOSCOPY;  Surgeon: Grace Isaac, MD;  Location: [facility];  Service: Thoracic;  Laterality: N/A;  . MOUTH SURGERY    . ROTATOR CUFF REPAIR    . TEE WITHOUT CARDIOVERSION N/A 05/28/2018   Procedure: TRANSESOPHAGEAL ECHOCARDIOGRAM (TEE);  Surgeon: Sanda Klein, MD;  Location: [facility];  Service: Cardiovascular;  Laterality: N/A;  . tumor remoced from neck    . VIDEO BRONCHOSCOPY WITH ENDOBRONCHIAL ULTRASOUND N/A 04/08/2017   Procedure: VIDEO BRONCHOSCOPY WITH ENDOBRONCHIAL ULTRASOUND;  Surgeon: Grace Isaac, MD;  Location: [facility];  Service: Thoracic;  Laterality: N/A;  . VIDEO BRONCHOSCOPY WITH ENDOBRONCHIAL ULTRASOUND N/A 12/08/2017   Procedure: VIDEO BRONCHOSCOPY with BRONCHIAL BIOPSIES AND ENDOBRONCHIAL ULTRASOUND WITH TRANSBRONCHIAL AND BRONCHIAL BIOPSIES;  Surgeon: Grace Isaac, MD;  Location: [facility];  Service: Thoracic;  Laterality: N/A;     Home Medications:  Prior to Admission medications   Medication Sig  Start Date End Date Taking? Authorizing Provider  acetaminophen (TYLENOL) 500 MG tablet Take 1,000 mg by mouth every 6 (six) hours as needed for moderate pain or headache.   Yes [provider]  amiodarone (PACERONE) 200 MG tablet Take 1 tablet (200 mg total) by mouth daily. 05/28/18  Yes Croitoru, Mihai, MD    apixaban (ELIQUIS) 2.5 MG TABS tablet Take 1 tablet (2.5 mg total) by mouth 2 (two) times daily. 05/18/18  Yes Chipper Herb, MD  carvedilol (COREG) 3.125 MG tablet Take 1 tablet (3.125 mg total) by mouth 2 (two) times daily. 06/01/18 06/01/19 Yes Baldwin Jamaica, PA-C  Cholecalciferol (VITAMIN D3) 5000 units CAPS Take 5,000 Units by mouth every evening.    Yes [provider]  glimepiride (AMARYL) 4 MG tablet TAKE 1 TABLET DAILY Patient taking differently: Take 4 mg by mouth daily with breakfast.  04/14/18  Yes Chipper Herb, MD  potassium chloride SA (K-DUR,KLOR-CON) 20 MEQ tablet Take 1-3 tablets (20-60 mEq total) by mouth daily as directed 08/09/18  Yes Croitoru, Mihai, MD  predniSONE (DELTASONE) 10 MG tablet TAKE (1) TABLET DAILY WITH BREAKFAST. Patient taking differently: Take 5 mg by mouth daily.  04/23/18  Yes Rigoberto Noel, MD  torsemide (DEMADEX) 20 MG tablet Take 1-4 tablets (20-80 mg total) by mouth daily as directed. 08/09/18  Yes Croitoru, Mihai, MD  glucose blood (ONETOUCH VERIO) test strip Twice Daily 01/25/18   Chipper Herb, MD    Inpatient Medications: Scheduled Meds: . amiodarone  200 mg Oral Daily  . carvedilol  3.125 mg Oral BID WC  . furosemide  60 mg Intravenous Q12H  . guaiFENesin  600 mg Oral BID  . insulin aspart  0-9 Units Subcutaneous Q4H  . polyethylene glycol  17 g Oral Daily  . predniSONE  5 mg Oral Q breakfast  . sodium chloride flush  3 mL Intravenous Q12H   Continuous Infusions: . sodium chloride    . magnesium sulfate 1 - 4 g bolus IVPB     PRN Meds: sodium chloride, acetaminophen **OR** acetaminophen, dextromethorphan, fentaNYL (SUBLIMAZE) injection, HYDROcodone-acetaminophen, ondansetron **OR** ondansetron (ZOFRAN) IV, sodium chloride flush  Allergies:   No Known Allergies  Social History:   Social History   Socioeconomic History  . Marital status: Married    Spouse name: Not on file  . Number of children: 1  . Years of education:  Not on file  . Highest education level: Not on file  Occupational History  . Occupation: Retired Advice worker  . Financial resource strain: Not on file  . Food insecurity:    Worry: Not on file    Inability: Not on file  . Transportation needs:    Medical: Not on file    Non-medical: Not on file  Tobacco Use  . Smoking status: Former Smoker    Packs/day: 0.10    Years: 53.00    Pack years: 5.30    Types: Cigarettes    Last attempt to quit: 11/28/2016    Years since quitting: 1.7  . Smokeless tobacco: Never Used  Substance and Sexual Activity  . Alcohol use: No    Alcohol/week: 0.0 standard drinks  . Drug use: No  . Sexual activity: Never  Lifestyle  . Physical activity:    Days per week: Not on file    Minutes per session: Not on file  . Stress: Not on file  Relationships  . Social connections:    Talks on phone:  Not on file    Gets together: Not on file    Attends religious service: Not on file    Active member of club or organization: Not on file    Attends meetings of clubs or organizations: Not on file    Relationship status: Not on file  . Intimate partner violence:    Fear of current or ex partner: Not on file    Emotionally abused: Not on file    Physically abused: Not on file    Forced sexual activity: Not on file  Other Topics Concern  . Not on file  Social History Narrative  . Not on file    Family History:    Family History  Problem Relation Age of Onset  . Kidney disease Mother   . Heart disease Father   . Cancer Son        prostate  . Suicidality Son      ROS:  Please see the history of present illness.   All other ROS reviewed and negative.     Physical Exam/Data:   Vitals:   08/11/18 2315 08/11/18 2330 08/12/18 0013 08/12/18 0501  BP:   (!) 145/89 138/74  Pulse: 72 73 92 (!) 52  Resp: (!) 23 20 20 17   Temp:   98.5 F (36.9 C) (!) 97.5 F (36.4 C)  TempSrc:   Oral Oral  SpO2: 92% 94% 92% 92%  Weight:   96.9 kg     Height:   6' (1.829 m)     Intake/Output Summary (Last 24 hours) at 08/12/2018 0831 Last data filed at 08/11/2018 2106 Gross per 24 hour  Intake 93.57 ml  Output 675 ml  Net -581.43 ml   Filed Weights   08/11/18 1409 08/12/18 0013  Weight: 96.6 kg 96.9 kg   Body mass index is 28.97 kg/m.  General:  Well nourished, well developed, in no acute distress, advanced age 77: Left eye ecchymosis, stitches noted Lymph: no adenopathy Neck: no significant JVD appreciated Endocrine:  No thryomegaly Vascular: No carotid bruits; FA pulses 2+ bilaterally without bruits  Cardiac:  normal S1, S2; RRR; no murmur  Lungs:  clear to auscultation bilaterally, no wheezing, rhonchi or rales  Abd: soft, nontender, no hepatomegaly  Ext: 1+ pedal edema noted bilaterally Musculoskeletal: Left arm dressed  skin: warm and dry  Neuro:  CNs 2-12 intact, no focal abnormalities noted Psych:  Normal affect   EKG:  The EKG was personally reviewed and demonstrates: Biventricular paced Telemetry:  Telemetry was personally reviewed and demonstrates: Biventricular pacing  Relevant CV Studies:  Echocardiogram 05/12/2018 - Left ventricle: The cavity size was normal. Wall thickness was   increased in a pattern of moderate LVH. Systolic function was   moderately reduced. The estimated ejection fraction was in the   range of 35% to 40%. The study is not technically sufficient to   allow evaluation of LV diastolic function. - Aortic valve: Moderately calcified annulus. Trileaflet;   moderately thickened leaflets. Valve area (VTI): 2.13 cm^2. Valve   area (Vmax): 2.47 cm^2. Valve area (Vmean): 2.54 cm^2. - Mitral valve: Mildly calcified annulus. Mildly thickened leaflets   . There was mild regurgitation. - Left atrium: The atrium was mildly dilated. - Technically difficult study.  Laboratory Data:  Chemistry Recent Labs  Lab 08/11/18 1611 08/12/18 0531  NA 140 139  K 4.2 3.9  CL 104 105  CO2 27 25   GLUCOSE 199* 127*  BUN 23 23  CREATININE 2.78* 2.71*  CALCIUM 9.0 8.4*  GFRNONAA 19* 20*  GFRAA 22* 23*  ANIONGAP 9 9    Recent Labs  Lab 08/12/18 0531  PROT 6.0*  ALBUMIN 3.1*  AST 16  ALT 10  ALKPHOS 52  BILITOT 0.7   Hematology Recent Labs  Lab 08/11/18 1611 08/12/18 0531  WBC 8.5 7.8  RBC 3.47* 3.35*  HGB 9.6* 9.3*  HCT 30.8* 29.7*  MCV 88.8 88.7  MCH 27.7 27.8  MCHC 31.2 31.3  RDW 13.9 13.9  PLT 245 195   Cardiac Enzymes Recent Labs  Lab 08/11/18 2353  TROPONINI 0.05*   No results for input(s): TROPIPOC in the last 168 hours.  BNP Recent Labs  Lab 08/11/18 1611  BNP 369.4*    DDimer No results for input(s): DDIMER in the last 168 hours.  Radiology/Studies:  Dg Wrist Complete Left  Result Date: 08/11/2018 CLINICAL DATA:  Left wrist pain after fall today. EXAM: LEFT WRIST - COMPLETE 3+ VIEW COMPARISON:  None. FINDINGS: Moderately displaced ulnar styloid fracture is noted. Posteriorly displaced and probably comminuted fracture of distal right radius is noted. Vascular calcifications are noted. IMPRESSION: Posteriorly displaced and probably comminuted distal right radial fracture. Moderately displaced ulnar styloid fracture. Electronically Signed   By: [name], M.D.   On: 08/11/2018 15:45   Ct Head Wo Contrast  Result Date: 08/11/2018 CLINICAL DATA:  Fall, laceration and hematoma over the left frontal orbital region. EXAM: CT HEAD WITHOUT CONTRAST TECHNIQUE: Contiguous axial images were obtained from the base of the skull through the vertex without intravenous contrast. COMPARISON:  None. FINDINGS: Brain: Advanced brain atrophy and chronic white matter microvascular changes with ventricular enlargement. No acute intracranial hemorrhage, mass lesion, definite new infarction, midline shift, herniation, hydrocephalus, or extra-axial fluid collection. No focal mass effect or edema. Cisterns are patent. Cerebellar atrophy as well. Vascular: Intracranial  atherosclerosis noted.  No hyperdense vessel. Skull: Intact skull. Anterior left frontal orbital scalp bruising/hematoma noted. Sinuses/Orbits: Sinuses remain clear. No other orbital abnormality. No proptosis. Other: None. IMPRESSION: Anterior frontal orbital soft tissue swelling/bruising. Atrophy and chronic white matter microvascular changes. No acute intracranial finding by noncontrast CT. Electronically Signed   By: Jerilynn Mages.  Shick M.D.   On: 08/11/2018 15:32   Dg Chest Portable 1 View  Result Date: 08/11/2018 CLINICAL DATA:  84 year old who fell onto concrete earlier today sustaining lacerations to the head and LEFT hand. Current history of RIGHT UPPER LOBE lung cancer. EXAM: PORTABLE CHEST 1 VIEW COMPARISON:  CT chest 07/02/2018, 04/15/2018 and earlier. Chest x-rays 06/01/2018, 02/23/2018 and earlier. FINDINGS: Cardiac silhouette markedly enlarged, unchanged. LEFT subclavian biventricular pacemaker unchanged and appears intact. Thoracic aorta atherosclerotic, unchanged. RIGHT hilar/suprahilar mass, not significantly changed since the CT 07/02/2018. Post radiation fibrosis and bronchiectasis in the RIGHT UPPER LOBE which has shown further evolution since that CT. No new pulmonary parenchymal abnormalities in either lung. Emphysematous changes throughout both lungs as noted previously. IMPRESSION: 1.  No acute cardiopulmonary disease. 2. RIGHT hilar/suprahilar mass which is essentially stable since the CT 07/02/2018. 3. Evolving post radiation fibrosis involving the RIGHT UPPER LOBE. Aortic Atherosclerosis (ICD10-I70.0) and Emphysema (ICD10-J43.9). Electronically Signed   By: Evangeline Dakin M.D.   On: 08/11/2018 18:22   Dg Hand Complete Left  Result Date: 08/11/2018 CLINICAL DATA:  Fall EXAM: LEFT HAND - COMPLETE 3+ VIEW COMPARISON:  None. FINDINGS: Osteopenia. There is deformity at the base of the proximal phalanx of the small finger. In impaction type fracture is not excluded. There is also deformity  of  the distal radius that is poorly visualized worrisome for a comminuted distal radius fracture. IMPRESSION: Possible fracture at the base of the proximal phalanx of the small finger. Dedicated small finger views may be helpful. Comminuted displaced fracture of the distal radius is suspected. A wrist radiograph series is recommended. Electronically Signed   By: Marybelle Killings M.D.   On: 08/11/2018 15:07   Dg Finger Little Left  Result Date: 08/11/2018 CLINICAL DATA:  Left fifth finger pain after fall. EXAM: LEFT LITTLE FINGER 2+V COMPARISON:  Radiographs of same day. FINDINGS: Probable nondisplaced fracture is seen involving the proximal base of the fifth proximal phalanx. Joint spaces are unremarkable. IMPRESSION: Probable nondisplaced fracture involving proximal base of fifth proximal phalanx. Electronically Signed   By: [name], M.D.   On: 08/11/2018 15:47    Assessment and Plan:   Preoperative cardiac risk assessment - He is of moderate to high risk cardiovascularly based upon his age, chronic systolic heart failure.  Has biventricular device in place.  Does not have any significant valvular abnormalities, no significant arrhythmias, have not seen atrial fibrillation since device implant.  Note, he does not have a defibrillator, CRT pacing only.  I think that he may proceed with surgery.  Be careful with fluid volume.  If hypoxia begins to occur, IV Lasix administration warranted.  Chronic systolic heart failure - EF 35%.  Mild hypervolemia.  Received Lasix overnight.  I think we should continue today.  Creatinine stable.  Watch potassium.  Net -600 mL.  Weight 96.9 kg.  Overall appears fairly stable from heart failure perspective.  We will continue with perioperative diuretics.  I do not think that his chronic systolic heart failure will preclude him from surgery either today or tomorrow.  Hand/wrist injury - Dr. Apolonio Schneiders.  Elevated troponin - Minimally elevated 0.05, correlates with his  chronic systolic heart failure, demand ischemia in the setting.  This is not acute coronary syndrome.  This does however portend a worsened prognosis overall.   Paroxysmal atrial fibrillation -Eliquis on hold.  He has not seen atrial for ablation since his CRT placement.  Amiodarone.  CKD stage IV -Avoid nephrotoxic agents.  Sarcoidosis -Prednisone, chronic mild wheezing.  Type 2 diabetes with chronic kidney disease stage III - Per primary team.      For questions or updates, please contact [facility] Please consult [email or web address] for contact info under     Signed, Candee Furbish, MD  08/12/2018 8:30 AM

## 2018-08-12 NOTE — Progress Notes (Signed)
CRITICAL VALUE ALERT  Critical Value:  Troponin 0.05  Date & Time Notied:  08/12/18 & 0036  Provider Notified: A. Doutova  Orders Received/Actions taken: received orders

## 2018-08-12 NOTE — Telephone Encounter (Signed)
New message   Patient's daughter in law is returning call.

## 2018-08-12 NOTE — Progress Notes (Addendum)
PROGRESS NOTE    Ruben Conner, DDS  EPP:[id number] DOB: 04-24-1930 DOA: 08/11/2018 PCP: Chipper Herb, MD    Brief Narrative:  84 y.o. male with medical history significant oflong-standing asymptomatic bradycardia, pulmonary and mediastinal sarcoidosis, pulmonary and mediastinal sarcoidosis,type 2 diabetes mellitus, systolic CHF  right upper lobe radiation pneumonitis. CKD stage IV cr at baseline 2-2.5, HTN, LBBB  Admitted for fall resulting in left wrist fracture in association with acute on chronic systolic CHF.    Assessment & Plan:   Active Problems:   Type 2 diabetes mellitus with stage 3 chronic kidney disease, without long-term current use of insulin (HCC)   Hypercholesterolemia   Essential hypertension   Anemia in stage 4 chronic kidney disease (HCC)   Sarcoidosis   CKD (chronic kidney disease) stage 4, GFR 15-29 ml/min (HCC)   Acute systolic heart failure (HCC)   Paroxysmal atrial fibrillation (HCC)   Biventricular automatic implantable cardioverter defibrillator in situ   CHF exacerbation (HCC)   Left wrist fracture, closed, initial encounter   Degloving injury of left hand   Acute on Chronic systolic Heart failure; evaluated by cardiology.  Will continue with IV lasix today.  Presented with SOB, abdominal distension.  Cardiology following.  Monitor renal function.  Weight 213.   Chronic kidney diseases stage IV;  Cr baseline; 2.1 Avoid nephrotoxin.  Monitor on IV lasix. Cr stable today.   HTN;   Paroxysmal A fib.  Biventricular automatic implantable cardioverter defibrillator in situ  Holding eliquis pre op.   Sarcoidosis; continue with prednisone.   DM type 2.  SSI.   Left Wrist Fracture;  Clear by cardiology.  Hopefully sx tomorrow.   Hypomagnesemia; replete IV   DVT prophylaxis: scd Code Status: full code.  Family Communication; no family at bedside.  Disposition Plan:  Remain in the hospital, cardiology evaluation pre op clearance.     Consultants:   Cardiology    Procedures:   none  Antimicrobials:    Subjective: He report pain arm controlled. Breathing well. Denies chest pain   Objective: Vitals:   08/11/18 2315 08/11/18 2330 08/12/18 0013 08/12/18 0501  BP:   (!) 145/89 138/74  Pulse: 72 73 92 (!) 52  Resp: (!) 23 20 20 17   Temp:   98.5 F (36.9 C) (!) 97.5 F (36.4 C)  TempSrc:   Oral Oral  SpO2: 92% 94% 92% 92%  Weight:   96.9 kg   Height:   6' (1.829 m)     Intake/Output Summary (Last 24 hours) at 08/12/2018 0827 Last data filed at 08/11/2018 2106 Gross per 24 hour  Intake 93.57 ml  Output 675 ml  Net -581.43 ml   Filed Weights   08/11/18 1409 08/12/18 0013  Weight: 96.6 kg 96.9 kg    Examination:  General exam: Appears calm and comfortable  Respiratory system: crackles bases.  Cardiovascular system: S1 & S2 heard, RRR.  Gastrointestinal system: Abdomen is nondistended, soft and nontender. No organomegaly or masses felt. Normal bowel sounds heard. Central nervous system: Alert and oriented. No focal neurological deficits. Extremities: Symmetric 5 x 5 power. Left arm with dressing.  Skin: No rashes, lesions or ulcers    Data Reviewed: I have personally reviewed following labs and imaging studies  CBC: Recent Labs  Lab 08/11/18 1611 08/12/18 0531  WBC 8.5 7.8  NEUTROABS 7.1  --   HGB 9.6* 9.3*  HCT 30.8* 29.7*  MCV 88.8 88.7  PLT 245 606   Basic Metabolic Panel: Recent  Labs  Lab 08/11/18 1611 08/12/18 0531  NA 140 139  K 4.2 3.9  CL 104 105  CO2 27 25  GLUCOSE 199* 127*  BUN 23 23  CREATININE 2.78* 2.71*  CALCIUM 9.0 8.4*  MG  --  1.5*  PHOS  --  3.8   GFR: Estimated Creatinine Clearance: 23.6 mL/min (A) (by C-G formula based on SCr of 2.71 mg/dL (H)). Liver Function Tests: Recent Labs  Lab 08/12/18 0531  AST 16  ALT 10  ALKPHOS 52  BILITOT 0.7  PROT 6.0*  ALBUMIN 3.1*   No results for input(s): LIPASE, AMYLASE in the last 168 hours. No  results for input(s): AMMONIA in the last 168 hours. Coagulation Profile: Recent Labs  Lab 08/11/18 1611  INR 1.10   Cardiac Enzymes: Recent Labs  Lab 08/11/18 2353  TROPONINI 0.05*   BNP (last 3 results) No results for input(s): PROBNP in the last 8760 hours. HbA1C: No results for input(s): HGBA1C in the last 72 hours. CBG: Recent Labs  Lab 08/11/18 2217 08/12/18 0503  GLUCAP 172* 119*   Lipid Profile: No results for input(s): CHOL, HDL, LDLCALC, TRIG, CHOLHDL, LDLDIRECT in the last 72 hours. Thyroid Function Tests: Recent Labs    08/12/18 0531  TSH 2.331   Anemia Panel: No results for input(s): VITAMINB12, FOLATE, FERRITIN, TIBC, IRON, RETICCTPCT in the last 72 hours. Sepsis Labs: No results for input(s): PROCALCITON, LATICACIDVEN in the last 168 hours.  No results found for this or any previous visit (from the past 240 hour(s)).       Radiology Studies: Dg Wrist Complete Left  Result Date: 08/11/2018 CLINICAL DATA:  Left wrist pain after fall today. EXAM: LEFT WRIST - COMPLETE 3+ VIEW COMPARISON:  None. FINDINGS: Moderately displaced ulnar styloid fracture is noted. Posteriorly displaced and probably comminuted fracture of distal right radius is noted. Vascular calcifications are noted. IMPRESSION: Posteriorly displaced and probably comminuted distal right radial fracture. Moderately displaced ulnar styloid fracture. Electronically Signed   By: [name], M.D.   On: 08/11/2018 15:45   Ct Head Wo Contrast  Result Date: 08/11/2018 CLINICAL DATA:  Fall, laceration and hematoma over the left frontal orbital region. EXAM: CT HEAD WITHOUT CONTRAST TECHNIQUE: Contiguous axial images were obtained from the base of the skull through the vertex without intravenous contrast. COMPARISON:  None. FINDINGS: Brain: Advanced brain atrophy and chronic white matter microvascular changes with ventricular enlargement. No acute intracranial hemorrhage, mass lesion, definite new  infarction, midline shift, herniation, hydrocephalus, or extra-axial fluid collection. No focal mass effect or edema. Cisterns are patent. Cerebellar atrophy as well. Vascular: Intracranial atherosclerosis noted.  No hyperdense vessel. Skull: Intact skull. Anterior left frontal orbital scalp bruising/hematoma noted. Sinuses/Orbits: Sinuses remain clear. No other orbital abnormality. No proptosis. Other: None. IMPRESSION: Anterior frontal orbital soft tissue swelling/bruising. Atrophy and chronic white matter microvascular changes. No acute intracranial finding by noncontrast CT. Electronically Signed   By: Jerilynn Mages.  Shick M.D.   On: 08/11/2018 15:32   Dg Chest Portable 1 View  Result Date: 08/11/2018 CLINICAL DATA:  84 year old who fell onto concrete earlier today sustaining lacerations to the head and LEFT hand. Current history of RIGHT UPPER LOBE lung cancer. EXAM: PORTABLE CHEST 1 VIEW COMPARISON:  CT chest 07/02/2018, 04/15/2018 and earlier. Chest x-rays 06/01/2018, 02/23/2018 and earlier. FINDINGS: Cardiac silhouette markedly enlarged, unchanged. LEFT subclavian biventricular pacemaker unchanged and appears intact. Thoracic aorta atherosclerotic, unchanged. RIGHT hilar/suprahilar mass, not significantly changed since the CT 07/02/2018. Post radiation fibrosis and  bronchiectasis in the RIGHT UPPER LOBE which has shown further evolution since that CT. No new pulmonary parenchymal abnormalities in either lung. Emphysematous changes throughout both lungs as noted previously. IMPRESSION: 1.  No acute cardiopulmonary disease. 2. RIGHT hilar/suprahilar mass which is essentially stable since the CT 07/02/2018. 3. Evolving post radiation fibrosis involving the RIGHT UPPER LOBE. Aortic Atherosclerosis (ICD10-I70.0) and Emphysema (ICD10-J43.9). Electronically Signed   By: Evangeline Dakin M.D.   On: 08/11/2018 18:22   Dg Hand Complete Left  Result Date: 08/11/2018 CLINICAL DATA:  Fall EXAM: LEFT HAND - COMPLETE 3+ VIEW  COMPARISON:  None. FINDINGS: Osteopenia. There is deformity at the base of the proximal phalanx of the small finger. In impaction type fracture is not excluded. There is also deformity of the distal radius that is poorly visualized worrisome for a comminuted distal radius fracture. IMPRESSION: Possible fracture at the base of the proximal phalanx of the small finger. Dedicated small finger views may be helpful. Comminuted displaced fracture of the distal radius is suspected. A wrist radiograph series is recommended. Electronically Signed   By: Marybelle Killings M.D.   On: 08/11/2018 15:07   Dg Finger Little Left  Result Date: 08/11/2018 CLINICAL DATA:  Left fifth finger pain after fall. EXAM: LEFT LITTLE FINGER 2+V COMPARISON:  Radiographs of same day. FINDINGS: Probable nondisplaced fracture is seen involving the proximal base of the fifth proximal phalanx. Joint spaces are unremarkable. IMPRESSION: Probable nondisplaced fracture involving proximal base of fifth proximal phalanx. Electronically Signed   By: [name], M.D.   On: 08/11/2018 15:47        Scheduled Meds: . amiodarone  200 mg Oral Daily  . carvedilol  3.125 mg Oral BID WC  . furosemide  60 mg Intravenous Q12H  . guaiFENesin  600 mg Oral BID  . insulin aspart  0-9 Units Subcutaneous Q4H  . polyethylene glycol  17 g Oral Daily  . predniSONE  5 mg Oral Q breakfast  . sodium chloride flush  3 mL Intravenous Q12H   Continuous Infusions: . sodium chloride    . magnesium sulfate 1 - 4 g bolus IVPB       LOS: 1 day    Time spent: 35 minutes    Elmarie Shiley, MD [facility] Pager [phone]  If 7PM-7AM, please contact night-coverage [email or web address] Password [facility] 08/12/2018, 8:27 AM

## 2018-08-12 NOTE — Progress Notes (Signed)
Plan for ORIF tomorrow pm.  Will make npo at 8 am.  Plan for left wrist orif and left hand laceration repair. preop orders placed

## 2018-08-12 NOTE — Progress Notes (Signed)
Patient arrived from ED to [place]; Introduced Biochemist, clinical. Very pleasant, would like to be called Dr. [name].  Skin assessment completed, CHG completed; VSS; Pain 3/10; No respiratory distress; A/O x 4; Hx of Fall - High Fall risk; bed alarm on; Mats on floor; call bell near pt. Left arm promoted on pillow; Will continue to monitor

## 2018-08-13 ENCOUNTER — Inpatient Hospital Stay (HOSPITAL_COMMUNITY): Payer: PPO | Admitting: Anesthesiology

## 2018-08-13 ENCOUNTER — Encounter (HOSPITAL_COMMUNITY)
Admission: EM | Disposition: A | Discharge status: Skilled Nursing Facility | Payer: Self-pay | Source: Home / Self Care | Attending: Internal Medicine

## 2018-08-13 ENCOUNTER — Ambulatory Visit (HOSPITAL_COMMUNITY): Payer: PPO

## 2018-08-13 ENCOUNTER — Encounter (HOSPITAL_COMMUNITY): Payer: Self-pay | Admitting: Certified Registered Nurse Anesthetist

## 2018-08-13 DIAGNOSIS — I5030 Unspecified diastolic (congestive) heart failure: Secondary | ICD-10-CM

## 2018-08-13 DIAGNOSIS — I10 Essential (primary) hypertension: Secondary | ICD-10-CM

## 2018-08-13 DIAGNOSIS — S0181XA Laceration without foreign body of other part of head, initial encounter: Secondary | ICD-10-CM

## 2018-08-13 DIAGNOSIS — Z01811 Encounter for preprocedural respiratory examination: Secondary | ICD-10-CM

## 2018-08-13 HISTORY — PX: ORIF WRIST FRACTURE: SHX2133

## 2018-08-13 LAB — BASIC METABOLIC PANEL
Anion gap: 10 (ref 5–15)
BUN: 28 mg/dL — AB (ref 8–23)
CHLORIDE: 99 mmol/L (ref 98–111)
CO2: 27 mmol/L (ref 22–32)
Calcium: 8.6 mg/dL — ABNORMAL LOW (ref 8.9–10.3)
Creatinine, Ser: 2.92 mg/dL — ABNORMAL HIGH (ref 0.61–1.24)
GFR calc Af Amer: 21 mL/min — ABNORMAL LOW (ref >60)
GFR calc non Af Amer: 18 mL/min — ABNORMAL LOW (ref >60)
GLUCOSE: 132 mg/dL — AB (ref 70–99)
POTASSIUM: 3.2 mmol/L — AB (ref 3.5–5.1)
SODIUM: 136 mmol/L (ref 135–145)

## 2018-08-13 LAB — GLUCOSE, CAPILLARY
GLUCOSE-CAPILLARY: 192 mg/dL — AB (ref 70–99)
Glucose-Capillary: 138 mg/dL — ABNORMAL HIGH (ref 70–99)
Glucose-Capillary: 146 mg/dL — ABNORMAL HIGH (ref 70–99)
Glucose-Capillary: 152 mg/dL — ABNORMAL HIGH (ref 70–99)
Glucose-Capillary: 161 mg/dL — ABNORMAL HIGH (ref 70–99)
Glucose-Capillary: 162 mg/dL — ABNORMAL HIGH (ref 70–99)
Glucose-Capillary: 182 mg/dL — ABNORMAL HIGH (ref 70–99)

## 2018-08-13 LAB — CBC
HCT: 28.1 % — ABNORMAL LOW (ref 39.0–52.0)
Hemoglobin: 9.1 g/dL — ABNORMAL LOW (ref 13.0–17.0)
MCH: 27.7 pg (ref 26.0–34.0)
MCHC: 32.4 g/dL (ref 30.0–36.0)
MCV: 85.4 fL (ref 78.0–100.0)
Platelets: 208 10*3/uL (ref 150–400)
RBC: 3.29 MIL/uL — ABNORMAL LOW (ref 4.22–5.81)
RDW: 13.8 % (ref 11.5–15.5)
WBC: 9.5 10*3/uL (ref 4.0–10.5)

## 2018-08-13 LAB — MAGNESIUM: Magnesium: 2 mg/dL (ref 1.7–2.4)

## 2018-08-13 LAB — SURGICAL PCR SCREEN
MRSA, PCR: NEGATIVE
Staphylococcus aureus: NEGATIVE

## 2018-08-13 LAB — ECHOCARDIOGRAM COMPLETE
HEIGHTINCHES: 72 in
WEIGHTICAEL: 3389.79 [oz_av]

## 2018-08-13 SURGERY — OPEN REDUCTION INTERNAL FIXATION (ORIF) WRIST FRACTURE
Anesthesia: General | Laterality: Left

## 2018-08-13 MED ORDER — ONDANSETRON HCL 4 MG/2ML IJ SOLN
INTRAMUSCULAR | Status: AC
  Filled 2018-08-13: qty 2

## 2018-08-13 MED ORDER — HYDRALAZINE HCL 20 MG/ML IJ SOLN
10.0000 mg | Freq: Three times a day (TID) | INTRAMUSCULAR | Status: DC | PRN
  Administered 2018-08-16: 10 mg via INTRAVENOUS
  Filled 2018-08-13: qty 1

## 2018-08-13 MED ORDER — FENTANYL CITRATE (PF) 100 MCG/2ML IJ SOLN
INTRAMUSCULAR | Status: AC
  Administered 2018-08-13: 50 ug via INTRAVENOUS
  Filled 2018-08-13: qty 2

## 2018-08-13 MED ORDER — CEFAZOLIN SODIUM-DEXTROSE 2-4 GM/100ML-% IV SOLN: 2.0000 g | INTRAVENOUS | Status: DC

## 2018-08-13 MED ORDER — 0.9 % SODIUM CHLORIDE (POUR BTL) OPTIME
TOPICAL | Status: DC | PRN
  Administered 2018-08-13: 1000 mL

## 2018-08-13 MED ORDER — CHLORHEXIDINE GLUCONATE 4 % EX LIQD: 60.0000 mL | Freq: Once | CUTANEOUS | Status: DC

## 2018-08-13 MED ORDER — LIDOCAINE HCL (CARDIAC) PF 100 MG/5ML IV SOSY
PREFILLED_SYRINGE | INTRAVENOUS | Status: DC | PRN
  Administered 2018-08-13: 80 mg via INTRAVENOUS

## 2018-08-13 MED ORDER — FENTANYL CITRATE (PF) 100 MCG/2ML IJ SOLN
50.0000 ug | Freq: Once | INTRAMUSCULAR | Status: AC
  Administered 2018-08-13: 50 ug via INTRAVENOUS

## 2018-08-13 MED ORDER — BUPIVACAINE HCL (PF) 0.25 % IJ SOLN
INTRAMUSCULAR | Status: AC
  Filled 2018-08-13: qty 30

## 2018-08-13 MED ORDER — SODIUM CHLORIDE 0.9 % IV SOLN
INTRAVENOUS | Status: DC | PRN
  Administered 2018-08-13: 20 ug/min via INTRAVENOUS

## 2018-08-13 MED ORDER — ONDANSETRON HCL 4 MG/2ML IJ SOLN
INTRAMUSCULAR | Status: DC | PRN
  Administered 2018-08-13: 4 mg via INTRAVENOUS

## 2018-08-13 MED ORDER — SODIUM CHLORIDE 0.9 % IV SOLN: INTRAVENOUS | Status: DC

## 2018-08-13 MED ORDER — LACTATED RINGERS IV SOLN
INTRAVENOUS | Status: DC | PRN
  Administered 2018-08-13: 15:00:00 via INTRAVENOUS

## 2018-08-13 MED ORDER — FENTANYL CITRATE (PF) 100 MCG/2ML IJ SOLN
INTRAMUSCULAR | Status: DC | PRN
  Administered 2018-08-13: 25 ug via INTRAVENOUS

## 2018-08-13 MED ORDER — IPRATROPIUM-ALBUTEROL 0.5-2.5 (3) MG/3ML IN SOLN
3.0000 mL | Freq: Four times a day (QID) | RESPIRATORY_TRACT | Status: DC
  Administered 2018-08-13: 3 mL via RESPIRATORY_TRACT
  Filled 2018-08-13: qty 3

## 2018-08-13 MED ORDER — PHENYLEPHRINE 40 MCG/ML (10ML) SYRINGE FOR IV PUSH (FOR BLOOD PRESSURE SUPPORT)
PREFILLED_SYRINGE | INTRAVENOUS | Status: AC
  Filled 2018-08-13: qty 10

## 2018-08-13 MED ORDER — BUDESONIDE 0.25 MG/2ML IN SUSP
0.2500 mg | Freq: Two times a day (BID) | RESPIRATORY_TRACT | Status: DC
  Administered 2018-08-13: 0.25 mg via RESPIRATORY_TRACT
  Filled 2018-08-13: qty 2

## 2018-08-13 MED ORDER — ONDANSETRON HCL 4 MG/2ML IJ SOLN: 4.0000 mg | Freq: Once | INTRAMUSCULAR | Status: DC | PRN

## 2018-08-13 MED ORDER — SODIUM CHLORIDE 0.9 % IV SOLN
INTRAVENOUS | Status: DC
  Administered 2018-08-13: 15:00:00 via INTRAVENOUS

## 2018-08-13 MED ORDER — BUPIVACAINE HCL (PF) 0.25 % IJ SOLN: INTRAMUSCULAR | Status: DC | PRN

## 2018-08-13 MED ORDER — ETOMIDATE 2 MG/ML IV SOLN
INTRAVENOUS | Status: DC | PRN
  Administered 2018-08-13: 20 mg via INTRAVENOUS

## 2018-08-13 MED ORDER — PROPOFOL 10 MG/ML IV BOLUS
INTRAVENOUS | Status: AC
  Filled 2018-08-13: qty 20

## 2018-08-13 MED ORDER — MIDAZOLAM HCL 2 MG/2ML IJ SOLN
INTRAMUSCULAR | Status: AC
  Filled 2018-08-13: qty 2

## 2018-08-13 MED ORDER — CEFAZOLIN SODIUM-DEXTROSE 2-4 GM/100ML-% IV SOLN
2.0000 g | INTRAVENOUS | Status: AC
  Administered 2018-08-13: 2 g via INTRAVENOUS
  Filled 2018-08-13: qty 100

## 2018-08-13 MED ORDER — ROPIVACAINE HCL 7.5 MG/ML IJ SOLN
INTRAMUSCULAR | Status: DC | PRN
  Administered 2018-08-13: 30 mL via PERINEURAL

## 2018-08-13 MED ORDER — FENTANYL CITRATE (PF) 100 MCG/2ML IJ SOLN: 25.0000 ug | INTRAMUSCULAR | Status: DC | PRN

## 2018-08-13 MED ORDER — FENTANYL CITRATE (PF) 250 MCG/5ML IJ SOLN
INTRAMUSCULAR | Status: AC
  Filled 2018-08-13: qty 5

## 2018-08-13 MED ORDER — POVIDONE-IODINE 10 % EX SWAB: 2.0000 "application " | Freq: Once | CUTANEOUS | Status: DC

## 2018-08-13 MED ORDER — CEFAZOLIN SODIUM-DEXTROSE 1-4 GM/50ML-% IV SOLN
1.0000 g | Freq: Three times a day (TID) | INTRAVENOUS | Status: AC
  Administered 2018-08-13 – 2018-08-14 (×2): 1 g via INTRAVENOUS
  Filled 2018-08-13 (×3): qty 50

## 2018-08-13 MED ORDER — POTASSIUM CHLORIDE CRYS ER 20 MEQ PO TBCR
40.0000 meq | EXTENDED_RELEASE_TABLET | Freq: Once | ORAL | Status: AC
  Administered 2018-08-13: 40 meq via ORAL
  Filled 2018-08-13: qty 2

## 2018-08-13 SURGICAL SUPPLY — 64 items
BANDAGE ACE 3X5.8 VEL STRL LF (GAUZE/BANDAGES/DRESSINGS) ×3 IMPLANT
BANDAGE ACE 4X5 VEL STRL LF (GAUZE/BANDAGES/DRESSINGS) ×5 IMPLANT
BIT DRILL 2.2 SS TIBIAL (BIT) ×2 IMPLANT
BLADE CLIPPER SURG (BLADE) IMPLANT
BNDG CMPR 9X4 STRL LF SNTH (GAUZE/BANDAGES/DRESSINGS) ×1
BNDG ESMARK 4X9 LF (GAUZE/BANDAGES/DRESSINGS) ×3 IMPLANT
BNDG GAUZE ELAST 4 BULKY (GAUZE/BANDAGES/DRESSINGS) ×3 IMPLANT
CORDS BIPOLAR (ELECTRODE) ×3 IMPLANT
COVER SURGICAL LIGHT HANDLE (MISCELLANEOUS) ×3 IMPLANT
CUFF TOURNIQUET SINGLE 18IN (TOURNIQUET CUFF) ×3 IMPLANT
CUFF TOURNIQUET SINGLE 24IN (TOURNIQUET CUFF) IMPLANT
DRAIN TLS ROUND 10FR (DRAIN) IMPLANT
DRAPE OEC MINIVIEW 54X84 (DRAPES) ×3 IMPLANT
DRAPE SURG 17X11 SM STRL (DRAPES) ×3 IMPLANT
DRSG ADAPTIC 3X8 NADH LF (GAUZE/BANDAGES/DRESSINGS) ×3 IMPLANT
ELECT REM PT RETURN 9FT ADLT (ELECTROSURGICAL) ×3
ELECTRODE REM PT RTRN 9FT ADLT (ELECTROSURGICAL) IMPLANT
GAUZE SPONGE 4X4 12PLY STRL (GAUZE/BANDAGES/DRESSINGS) ×3 IMPLANT
GAUZE SPONGE 4X4 12PLY STRL LF (GAUZE/BANDAGES/DRESSINGS) ×2 IMPLANT
GAUZE XEROFORM 5X9 LF (GAUZE/BANDAGES/DRESSINGS) ×2 IMPLANT
GLOVE BIOGEL PI IND STRL 8.5 (GLOVE) ×1 IMPLANT
GLOVE BIOGEL PI INDICATOR 8.5 (GLOVE) ×2
GLOVE SURG ORTHO 8.0 STRL STRW (GLOVE) ×3 IMPLANT
GOWN STRL REUS W/ TWL LRG LVL3 (GOWN DISPOSABLE) ×3 IMPLANT
GOWN STRL REUS W/ TWL XL LVL3 (GOWN DISPOSABLE) ×1 IMPLANT
GOWN STRL REUS W/TWL LRG LVL3 (GOWN DISPOSABLE) ×9
GOWN STRL REUS W/TWL XL LVL3 (GOWN DISPOSABLE) ×3
K-WIRE 1.6 (WIRE) ×3
K-WIRE FX5X1.6XNS BN SS (WIRE) ×1
KIT BASIN OR (CUSTOM PROCEDURE TRAY) ×3 IMPLANT
KIT TURNOVER KIT B (KITS) ×3 IMPLANT
KWIRE FX5X1.6XNS BN SS (WIRE) IMPLANT
MANIFOLD NEPTUNE II (INSTRUMENTS) ×3 IMPLANT
NDL HYPO 25X1 1.5 SAFETY (NEEDLE) ×1 IMPLANT
NEEDLE HYPO 25X1 1.5 SAFETY (NEEDLE) ×3 IMPLANT
NS IRRIG 1000ML POUR BTL (IV SOLUTION) ×3 IMPLANT
PACK ORTHO EXTREMITY (CUSTOM PROCEDURE TRAY) ×3 IMPLANT
PAD ARMBOARD 7.5X6 YLW CONV (MISCELLANEOUS) ×6 IMPLANT
PAD CAST 4YDX4 CTTN HI CHSV (CAST SUPPLIES) ×1 IMPLANT
PADDING CAST ABS 4INX4YD NS (CAST SUPPLIES) ×2
PADDING CAST ABS COTTON 4X4 ST (CAST SUPPLIES) IMPLANT
PADDING CAST COTTON 4X4 STRL (CAST SUPPLIES) ×3
PEG LOCKING SMOOTH 2.2X20 (Screw) ×4 IMPLANT
PEG LOCKING SMOOTH 2.2X22 (Screw) ×6 IMPLANT
PEG LOCKING SMOOTH 2.2X24 (Peg) ×6 IMPLANT
PEG LOCKING SMOOTH 2.2X26 (Peg) ×2 IMPLANT
PLATE WIDE DVR LEFT (Plate) ×2 IMPLANT
SCREW LOCK 16X2.7X 3 LD TPR (Screw) IMPLANT
SCREW LOCKING 2.7X15MM (Screw) ×4 IMPLANT
SCREW LOCKING 2.7X16 (Screw) ×9 IMPLANT
SLING ARM IMMOBILIZER LRG (SOFTGOODS) ×2 IMPLANT
SOAP 2 % CHG 4 OZ (WOUND CARE) ×3 IMPLANT
SPLINT FIBERGLASS 3X35 (CAST SUPPLIES) ×2 IMPLANT
SUT PROLENE 3 0 PS 2 (SUTURE) ×12 IMPLANT
SUT PROLENE 4 0 PS 2 18 (SUTURE) IMPLANT
SUT VIC AB 2-0 FS1 27 (SUTURE) ×2 IMPLANT
SUT VICRYL 4-0 PS2 18IN ABS (SUTURE) ×2 IMPLANT
SYR CONTROL 10ML LL (SYRINGE) ×2 IMPLANT
SYSTEM CHEST DRAIN TLS 7FR (DRAIN) IMPLANT
TOWEL OR 17X24 6PK STRL BLUE (TOWEL DISPOSABLE) ×3 IMPLANT
TOWEL OR 17X26 10 PK STRL BLUE (TOWEL DISPOSABLE) ×3 IMPLANT
TUBE CONNECTING 12'X1/4 (SUCTIONS) ×1
TUBE CONNECTING 12X1/4 (SUCTIONS) ×2 IMPLANT
WATER STERILE IRR 1000ML POUR (IV SOLUTION) ×3 IMPLANT

## 2018-08-13 NOTE — Progress Notes (Signed)
   DC'd lasix 40mg  IV BID. Creatinine increase.  Giving KDUR 76meq once-K3.2  Candee Furbish, MD

## 2018-08-13 NOTE — Progress Notes (Signed)
R/B/A DISCUSSED WITH PT IN HOSPITAL.  PT VOICED UNDERSTANDING OF PLAN CONSENT SIGNED DAY OF SURGERY PT SEEN AND EXAMINED PRIOR TO OPERATIVE PROCEDURE/DAY OF SURGERY SITE MARKED. QUESTIONS ANSWERED WILL REMAIN AN INPATIENT FOLLOWING SURGERY

## 2018-08-13 NOTE — Anesthesia Preprocedure Evaluation (Addendum)
Anesthesia Evaluation  Patient identified by MRN, date of birth, ID band Patient awake    Reviewed: Allergy & Precautions, NPO status , Patient's Chart, lab work & pertinent test results, reviewed documented beta blocker date and time   Airway Mallampati: II  TM Distance: >3 FB Neck ROM: Full    Dental  (+) Teeth Intact, Dental Advisory Given   Pulmonary former smoker,  Sarcoidosis Lung cancer    Pulmonary exam normal breath sounds clear to auscultation       Cardiovascular hypertension, Pt. on home beta blockers and Pt. on medications (-) angina+CHF  (-) Past MI + dysrhythmias (PAF) + pacemaker  Rhythm:Regular Rate:Normal  chronic systolic heart failure, biventricular CRT pacemaker, [place]  [place] 9.27.19: Study Conclusions  - Left ventricle: The cavity size was normal. Wall thickness was increased in a pattern of moderate LVH. Systolic function was severely reduced. The estimated ejection fraction was in the range of 25% to 30%. Diffuse hypokinesis. Doppler parameters are consistent with abnormal left ventricular relaxation (grade 1 diastolic dysfunction). - Aortic root: The aortic root was mildly dilated.  Impressions:  - Severe global reduction in LV systolic function; moderate LVH; mild diastolic dysfunction; mildly dilated aortic root.   Neuro/Psych negative neurological ROS  negative psych ROS   GI/Hepatic negative GI ROS, Neg liver ROS,   Endo/Other  diabetes, Type 2, Oral Hypoglycemic Agents  Renal/GU Renal InsufficiencyRenal disease     Musculoskeletal negative musculoskeletal ROS (+) Arthritis ,   Abdominal   Peds  Hematology  (+) Blood dyscrasia (Eliquis), anemia ,   Anesthesia Other Findings Day of surgery medications reviewed with the patient.  Reproductive/Obstetrics                           Anesthesia Physical Anesthesia Plan  ASA: IV  Anesthesia Plan: General    Post-op Pain Management:  Regional for Post-op pain   Induction: Intravenous  PONV Risk Score and Plan: 2 and Propofol infusion and Ondansetron  Airway Management Planned: LMA  Additional Equipment:   Intra-op Plan:   Post-operative Plan: Extubation in OR  Informed Consent: I have reviewed the patients History and Physical, chart, labs and discussed the procedure including the risks, benefits and alternatives for the proposed anesthesia with the patient or authorized representative who has indicated his/her understanding and acceptance.   Dental advisory given  Plan Discussed with: CRNA  Anesthesia Plan Comments:        Anesthesia Quick Evaluation

## 2018-08-13 NOTE — Transfer of Care (Signed)
Immediate Anesthesia Transfer of Care Note  Patient: Ruben Conner, DDS  Procedure(s) Performed: OPEN REDUCTION INTERNAL FIXATION (ORIF) WRIST FRACTURE (Left )  Patient Location: PACU  Anesthesia Type:General  Level of Consciousness: awake and drowsy  Airway & Oxygen Therapy: Patient Spontanous Breathing and Patient connected to nasal cannula oxygen  Post-op Assessment: Report given to RN, Post -op Vital signs reviewed and stable and Patient moving all extremities X 4  Post vital signs: Reviewed and stable  Last Vitals:  Vitals Value Taken Time  BP 163/102 08/13/2018  5:20 PM  Temp    Pulse 66 08/13/2018  5:24 PM  Resp 19 08/13/2018  5:24 PM  SpO2 93 % 08/13/2018  5:24 PM  Vitals shown include unvalidated device data.  Last Pain:  Vitals:   08/13/18 1400  TempSrc:   PainSc: 0-No pain         Complications: No apparent anesthesia complications

## 2018-08-13 NOTE — Progress Notes (Signed)
  Echocardiogram 2D Echocardiogram has been performed.  [name] 08/13/2018, 11:07 AM

## 2018-08-13 NOTE — Progress Notes (Addendum)
PROGRESS NOTE    Ruben Conner, DDS  VHQ:[id number] DOB: 1930/04/05 DOA: 08/11/2018 PCP: Chipper Herb, MD    Brief Narrative:  84 y.o. male with medical history significant oflong-standing asymptomatic bradycardia, pulmonary and mediastinal sarcoidosis, pulmonary and mediastinal sarcoidosis,type 2 diabetes mellitus, systolic CHF  right upper lobe radiation pneumonitis. CKD stage IV cr at baseline 2-2.5, HTN, LBBB  Admitted for fall resulting in left wrist fracture in association with acute on chronic systolic CHF.    Assessment & Plan:   Active Problems:   Type 2 diabetes mellitus with stage 3 chronic kidney disease, without long-term current use of insulin (HCC)   Hypercholesterolemia   Essential hypertension   Anemia in stage 4 chronic kidney disease (HCC)   Sarcoidosis   CKD (chronic kidney disease) stage 4, GFR 15-29 ml/min (HCC)   Acute systolic heart failure (HCC)   Paroxysmal atrial fibrillation (HCC)   Biventricular automatic implantable cardioverter defibrillator in situ   CHF exacerbation (HCC)   Left wrist fracture, closed, initial encounter   Degloving injury of left hand   Acute on Chronic systolic Heart failure; evaluated by cardiology.  Treated with IV lasix. 60 mg BID> holding lasix today due to increase in creatinine.  Presented with SOB, abdominal distension.  Cardiology following.  Monitor renal function.  Weight 213. ---211( per patient he take torsemide 20 mg when his weight is at 197, if his weight is around 260 he takes 80 mg torsemide).  Sarcoidosis; continue with prednisone.  sarcoidosis. Hilar mass.  Post radiation pneumonitis.  Report cough, congestion after air was turn down.  Will start nebulizer, Pulmicort.  Pulmonary evaluation pre-op, due to history of sarcoidosis, and post radiation Pneumonitis.   Chronic kidney diseases stage IV;  Cr baseline; 2.1 Avoid nephrotoxin.  Holding lasix today due to increase cr.   HTN;  Continue with  carvedilol, PRNH hydralazine.   Paroxysmal A fib.  Biventricular automatic implantable cardioverter defibrillator in situ  Holding eliquis pre op.  Continue with amiodarone and carvedilol.    DM type 2.  SSI.   Left Wrist Fracture;  Clear by cardiology.  For sx today, if respiratory status stable. Will ask pulmonary evaluation.   Hypomagnesemia; received IV magnesium.  Repeat level.   Hypokalemia; replete orally.   DVT prophylaxis: scd Code Status: full code.  Family Communication; no family at bedside.  Disposition Plan:  Remain inpatient for treatment of HR, aki, sx.    Consultants:   Cardiology    Procedures:   none  Antimicrobials:    Subjective: Left wrist pain is controlled. Get worse when he moves arm.  He denies chest pain. He report cough and feels congested after they decreased air.    Objective: Vitals:   08/12/18 1315 08/12/18 2002 08/13/18 0344 08/13/18 0733  BP: 133/66 120/68 (!) 144/75 (!) 143/100  Pulse: 70 (!) 29 84 85  Resp: (!) 24 (!) 21 17 18   Temp: 98.2 F (36.8 C) 98.2 F (36.8 C) 98.2 F (36.8 C) 98.4 F (36.9 C)  TempSrc: Oral Oral Oral Oral  SpO2: 94% 92% 94% 96%  Weight:   96.1 kg   Height:        Intake/Output Summary (Last 24 hours) at 08/13/2018 0756 Last data filed at 08/13/2018 0739 Gross per 24 hour  Intake 600 ml  Output 3450 ml  Net -2850 ml   Filed Weights   08/11/18 1409 08/12/18 0013 08/13/18 0344  Weight: 96.6 kg 96.9 kg 96.1 kg  Examination:  General exam: NAD, left eye periorbital hematoma. improved Respiratory system: Ronchus predominant on the right  Cardiovascular system: S 1, S 2 RRR Gastrointestinal system: BS present, soft, nt Central nervous system: non focal.  Extremities: left arm splint.  Skin: no rashes.     Data Reviewed: I have personally reviewed following labs and imaging studies  CBC: Recent Labs  Lab 08/11/18 1611 08/12/18 0531 08/13/18 0309  WBC 8.5 7.8 9.5  NEUTROABS  7.1  --   --   HGB 9.6* 9.3* 9.1*  HCT 30.8* 29.7* 28.1*  MCV 88.8 88.7 85.4  PLT 245 195 161   Basic Metabolic Panel: Recent Labs  Lab 08/11/18 1611 08/12/18 0531 08/13/18 0309  NA 140 139 136  K 4.2 3.9 3.2*  CL 104 105 99  CO2 27 25 27   GLUCOSE 199* 127* 132*  BUN 23 23 28*  CREATININE 2.78* 2.71* 2.92*  CALCIUM 9.0 8.4* 8.6*  MG  --  1.5*  --   PHOS  --  3.8  --    GFR: Estimated Creatinine Clearance: 21.8 mL/min (A) (by C-G formula based on SCr of 2.92 mg/dL (H)). Liver Function Tests: Recent Labs  Lab 08/12/18 0531  AST 16  ALT 10  ALKPHOS 52  BILITOT 0.7  PROT 6.0*  ALBUMIN 3.1*   No results for input(s): LIPASE, AMYLASE in the last 168 hours. No results for input(s): AMMONIA in the last 168 hours. Coagulation Profile: Recent Labs  Lab 08/11/18 1611  INR 1.10   Cardiac Enzymes: Recent Labs  Lab 08/11/18 2353  TROPONINI 0.05*   BNP (last 3 results) No results for input(s): PROBNP in the last 8760 hours. HbA1C: Recent Labs    08/12/18 0531  HGBA1C 7.5*   CBG: Recent Labs  Lab 08/12/18 1629 08/12/18 2000 08/12/18 2331 08/13/18 0349 08/13/18 0731  GLUCAP 230* 168* 139* 138* 146*   Lipid Profile: No results for input(s): CHOL, HDL, LDLCALC, TRIG, CHOLHDL, LDLDIRECT in the last 72 hours. Thyroid Function Tests: Recent Labs    08/12/18 0531  TSH 2.331   Anemia Panel: No results for input(s): VITAMINB12, FOLATE, FERRITIN, TIBC, IRON, RETICCTPCT in the last 72 hours. Sepsis Labs: No results for input(s): PROCALCITON, LATICACIDVEN in the last 168 hours.  No results found for this or any previous visit (from the past 240 hour(s)).       Radiology Studies: Dg Wrist Complete Left  Result Date: 08/11/2018 CLINICAL DATA:  Left wrist pain after fall today. EXAM: LEFT WRIST - COMPLETE 3+ VIEW COMPARISON:  None. FINDINGS: Moderately displaced ulnar styloid fracture is noted. Posteriorly displaced and probably comminuted fracture of distal  right radius is noted. Vascular calcifications are noted. IMPRESSION: Posteriorly displaced and probably comminuted distal right radial fracture. Moderately displaced ulnar styloid fracture. Electronically Signed   By: [name], M.D.   On: 08/11/2018 15:45   Ct Head Wo Contrast  Result Date: 08/11/2018 CLINICAL DATA:  Fall, laceration and hematoma over the left frontal orbital region. EXAM: CT HEAD WITHOUT CONTRAST TECHNIQUE: Contiguous axial images were obtained from the base of the skull through the vertex without intravenous contrast. COMPARISON:  None. FINDINGS: Brain: Advanced brain atrophy and chronic white matter microvascular changes with ventricular enlargement. No acute intracranial hemorrhage, mass lesion, definite new infarction, midline shift, herniation, hydrocephalus, or extra-axial fluid collection. No focal mass effect or edema. Cisterns are patent. Cerebellar atrophy as well. Vascular: Intracranial atherosclerosis noted.  No hyperdense vessel. Skull: Intact skull. Anterior left frontal  orbital scalp bruising/hematoma noted. Sinuses/Orbits: Sinuses remain clear. No other orbital abnormality. No proptosis. Other: None. IMPRESSION: Anterior frontal orbital soft tissue swelling/bruising. Atrophy and chronic white matter microvascular changes. No acute intracranial finding by noncontrast CT. Electronically Signed   By: Jerilynn Mages.  Shick M.D.   On: 08/11/2018 15:32   Dg Chest Portable 1 View  Result Date: 08/11/2018 CLINICAL DATA:  84 year old who fell onto concrete earlier today sustaining lacerations to the head and LEFT hand. Current history of RIGHT UPPER LOBE lung cancer. EXAM: PORTABLE CHEST 1 VIEW COMPARISON:  CT chest 07/02/2018, 04/15/2018 and earlier. Chest x-rays 06/01/2018, 02/23/2018 and earlier. FINDINGS: Cardiac silhouette markedly enlarged, unchanged. LEFT subclavian biventricular pacemaker unchanged and appears intact. Thoracic aorta atherosclerotic, unchanged. RIGHT  hilar/suprahilar mass, not significantly changed since the CT 07/02/2018. Post radiation fibrosis and bronchiectasis in the RIGHT UPPER LOBE which has shown further evolution since that CT. No new pulmonary parenchymal abnormalities in either lung. Emphysematous changes throughout both lungs as noted previously. IMPRESSION: 1.  No acute cardiopulmonary disease. 2. RIGHT hilar/suprahilar mass which is essentially stable since the CT 07/02/2018. 3. Evolving post radiation fibrosis involving the RIGHT UPPER LOBE. Aortic Atherosclerosis (ICD10-I70.0) and Emphysema (ICD10-J43.9). Electronically Signed   By: Evangeline Dakin M.D.   On: 08/11/2018 18:22   Dg Hand Complete Left  Result Date: 08/11/2018 CLINICAL DATA:  Fall EXAM: LEFT HAND - COMPLETE 3+ VIEW COMPARISON:  None. FINDINGS: Osteopenia. There is deformity at the base of the proximal phalanx of the small finger. In impaction type fracture is not excluded. There is also deformity of the distal radius that is poorly visualized worrisome for a comminuted distal radius fracture. IMPRESSION: Possible fracture at the base of the proximal phalanx of the small finger. Dedicated small finger views may be helpful. Comminuted displaced fracture of the distal radius is suspected. A wrist radiograph series is recommended. Electronically Signed   By: Marybelle Killings M.D.   On: 08/11/2018 15:07   Dg Finger Little Left  Result Date: 08/11/2018 CLINICAL DATA:  Left fifth finger pain after fall. EXAM: LEFT LITTLE FINGER 2+V COMPARISON:  Radiographs of same day. FINDINGS: Probable nondisplaced fracture is seen involving the proximal base of the fifth proximal phalanx. Joint spaces are unremarkable. IMPRESSION: Probable nondisplaced fracture involving proximal base of fifth proximal phalanx. Electronically Signed   By: [name], M.D.   On: 08/11/2018 15:47        Scheduled Meds: . amiodarone  200 mg Oral Daily  . carvedilol  3.125 mg Oral BID WC  . chlorhexidine   60 mL Topical Once  . chlorhexidine  60 mL Topical Once  . guaiFENesin  600 mg Oral BID  . insulin aspart  0-9 Units Subcutaneous Q4H  . ipratropium-albuterol  3 mL Nebulization Q6H  . polyethylene glycol  17 g Oral Daily  . potassium chloride  40 mEq Oral Once  . povidone-iodine  2 application Topical Once  . predniSONE  5 mg Oral Q breakfast  . sodium chloride flush  3 mL Intravenous Q12H   Continuous Infusions: . sodium chloride    .  ceFAZolin (ANCEF) IV       LOS: 2 days    Time spent: 35 minutes    Elmarie Shiley, MD [facility] Pager [phone]  If 7PM-7AM, please contact night-coverage [email or web address] Password [facility] 08/13/2018, 7:56 AM

## 2018-08-13 NOTE — Anesthesia Procedure Notes (Signed)
Anesthesia Regional Block: Axillary brachial plexus block   Pre-Anesthetic Checklist: ,, timeout performed, Correct Patient, Correct Site, Correct Laterality, Correct Procedure, Correct Position, site marked, Risks and benefits discussed,  Surgical consent,  Pre-op evaluation,  At surgeon's request and post-op pain management  Laterality: Left  Prep: chloraprep       Needles:  Injection technique: Single-shot  Needle Type: Echogenic Needle     Needle Length: 9cm  Needle Gauge: 21     Additional Needles:   Procedures:,,,, ultrasound used (permanent image in chart),,,,  Narrative:  Start time: 08/13/2018 3:27 PM End time: 08/13/2018 3:32 PM Injection made incrementally with aspirations every 5 mL.  Performed by: Personally  Anesthesiologist: Catalina Gravel, MD  Additional Notes: No pain on injection. No increased resistance to injection. Injection made in 5cc increments.  Good needle visualization.  Patient tolerated procedure well.

## 2018-08-13 NOTE — Op Note (Signed)
PREOPERATIVE DIAGNOSIS:left wrist intra-articular distal radius fracture 3 more fragments Left hand traumatic laceration 12-1/2 cm dorsum of the hand  POSTOPERATIVE DIAGNOSIS:same  ATTENDING SURGEON:Dr. Gavin Pound who was scrubbed and present for the entire procedure  ASSISTANT SURGEON:none  ANESTHESIA:Gen. With regional block  OPERATIVE PROCEDURE:#1: Open treatment of left wrist intra-articular distal radius fracture 3 more fragments #2: Complex laceration repair left hand 12-1/2 cm #3: Left wrist brachia radialis tendon release, tendon tenotomy #4: Radiographs 3 views left wrist  IMPLANTS:[employer] DVR wide cross lock  RADIOGRAPHIC INTERPRETATION:AP lateral oblique views the wrist to show the volar plate fixation place in good position  SURGICAL INDICATIONS:patient is a right-hand-dominant gentleman who sustained a closed left wrist fracture. Patient was seen and evaluated. After medical and cardiac clearance patient elected undergo the above procedure. Risks of surgery include but not limited to bleeding infection damage to nearby nerves arteries or tendons loss of motion of the wrists and digits incomplete relief of symptoms and need for further surgical intervention  [place] is properly identified preoperative holding area marked for permanent marker made on the left wrist indicate correct operative site.preoperative antibiotics were given prior any skin incision. Patient brought back to operating room placed supine on anesthesia room table where the general anesthetic was administered. A well-padded tourniquet was then placed on the left brachium and sealed with the appropriate drape. The left upper extremities and prepped and draped in normal sterile fashion. A timeout was called the correct site was identified and the procedure then begun. Attention was then turned to the wrist. A longitudinal incision made directly over the volar aspect of the wrist. Dissection carried  down through the skin and subcutaneous tissue. Going through the FCR sheath the FPL was then swept out of the way. Blunt dissection carried down to the pronator quadratus and the pronator quadratus was then elevated in an L-shaped fashion. The brachia radialis was then carefully released off the radial styloid tendon tenotomy was then carried out to reduce the articular fragments along the radial column. This was an intra-articular fracture 3 or more fragments. After open reduction was then performed of the volar plate was then applied and held distally with a K wire. Plate position was then adjusted using the aid in the mini C-arm. After plate position confirmed oblong screw hole was then placed proximally the distal screw peg fixation was then carried out. From an ulnar to radial direction. Proximal fixation was then carried out with combination of locking and nonlocking screws in the shaft. The wound was then thoroughly irrigated. Final radiographs then obtained. The pronator quadratus was then closed with 2-0 Vicryl. The subcutaneous tissues closed with Vicryl and the skin closed with 3-0 Prolene suture. Attention was then turned to the dorsal aspect of the hand. The 12-1/2 cm laceration was then thoroughly irrigated. The extensor tendons were in continuity. The skin flaps were then carefully mobilized in order to close the soft tissue directly over the extensor mechanism. Complex wound closure was then done with a 3-0 Prolene sutures. Xeroform dressing and a sterile compressive bandage then applied. The patient tolerated the procedure well. The patient's placed in a well-padded sugar tong splint and extubated taken recovery room in good condition.  POSTOPERATIVE PLAN:the patient be discharged back to the floor. Can be discharged to home once his pain is controlled and he has been cleared by medicine and cardiology to go home. Nonweightbearing the upper extremity. Follow-up in the office in 10-14 days for  wound check x-rays  out of the splint application of a short arm cast place the therapy order begin therapy regimen at the four-week mark. Radiographs at each visit.

## 2018-08-13 NOTE — Anesthesia Procedure Notes (Signed)
Procedure Name: LMA Insertion Performed by: Inda Coke, CRNA Pre-anesthesia Checklist: Patient identified, Emergency Drugs available, Suction available and Patient being monitored Patient Re-evaluated:Patient Re-evaluated prior to induction Oxygen Delivery Method: Circle System Utilized Preoxygenation: Pre-oxygenation with 100% oxygen Induction Type: IV induction Ventilation: Mask ventilation without difficulty LMA: LMA inserted LMA Size: 5.0 Number of attempts: 1 Airway Equipment and Method: Bite block Placement Confirmation: positive ETCO2 Tube secured with: Tape Dental Injury: Teeth and Oropharynx as per pre-operative assessment

## 2018-08-13 NOTE — Progress Notes (Addendum)
Progress Note  Patient Name: Ruben Conner, DDS Date of Encounter: 08/13/2018  Primary Cardiologist: Sanda Klein, MD   Subjective   Feeling well. Anticipating surgery on his left wrist.  No shortness of breath, no chest pain.  Continued mild wheezing heard throughout lung fields.  Creatinine increased 2.9.  Stopped Lasix.  Inpatient Medications    Scheduled Meds: . amiodarone  200 mg Oral Daily  . budesonide (PULMICORT) nebulizer solution  0.25 mg Nebulization BID  . carvedilol  3.125 mg Oral BID WC  . chlorhexidine  60 mL Topical Once  . chlorhexidine  60 mL Topical Once  . guaiFENesin  600 mg Oral BID  . insulin aspart  0-9 Units Subcutaneous Q4H  . ipratropium-albuterol  3 mL Nebulization Q6H  . polyethylene glycol  17 g Oral Daily  . povidone-iodine  2 application Topical Once  . predniSONE  5 mg Oral Q breakfast  . sodium chloride flush  3 mL Intravenous Q12H   Continuous Infusions: . sodium chloride    .  ceFAZolin (ANCEF) IV     PRN Meds: sodium chloride, acetaminophen **OR** acetaminophen, dextromethorphan, fentaNYL (SUBLIMAZE) injection, hydrALAZINE, HYDROcodone-acetaminophen, ondansetron **OR** ondansetron (ZOFRAN) IV, sodium chloride flush, zolpidem   Vital Signs    Vitals:   08/12/18 1315 08/12/18 2002 08/13/18 0344 08/13/18 0733  BP: 133/66 120/68 (!) 144/75 (!) 143/100  Pulse: 70 (!) 29 84 85  Resp: (!) 24 (!) 21 17 18   Temp: 98.2 F (36.8 C) 98.2 F (36.8 C) 98.2 F (36.8 C) 98.4 F (36.9 C)  TempSrc: Oral Oral Oral Oral  SpO2: 94% 92% 94% 96%  Weight:   96.1 kg   Height:        Intake/Output Summary (Last 24 hours) at 08/13/2018 0856 Last data filed at 08/13/2018 0739 Gross per 24 hour  Intake 600 ml  Output 2600 ml  Net -2000 ml   Filed Weights   08/11/18 1409 08/12/18 0013 08/13/18 0344  Weight: 96.6 kg 96.9 kg 96.1 kg    Telemetry    AV pacing- Personally Reviewed  ECG    AV pacing- Personally Reviewed  Physical Exam    GEN: No acute distress.  Elderly, left eye ecchymosis, stitches noted Neck: No JVD Cardiac: RRR, no murmurs, rubs, or gallops.  Respiratory:  Chronic mild wheezing bilaterally. GI: Soft, nontender, non-distended  MS: No edema; No deformity.  Left arm, forearm dressed Neuro:  Nonfocal  Psych: Normal affect   Labs    Chemistry Recent Labs  Lab 08/11/18 1611 08/12/18 0531 08/13/18 0309  NA 140 139 136  K 4.2 3.9 3.2*  CL 104 105 99  CO2 27 25 27   GLUCOSE 199* 127* 132*  BUN 23 23 28*  CREATININE 2.78* 2.71* 2.92*  CALCIUM 9.0 8.4* 8.6*  PROT  --  6.0*  --   ALBUMIN  --  3.1*  --   AST  --  16  --   ALT  --  10  --   ALKPHOS  --  52  --   BILITOT  --  0.7  --   GFRNONAA 19* 20* 18*  GFRAA 22* 23* 21*  ANIONGAP 9 9 10      Hematology Recent Labs  Lab 08/11/18 1611 08/12/18 0531 08/13/18 0309  WBC 8.5 7.8 9.5  RBC 3.47* 3.35* 3.29*  HGB 9.6* 9.3* 9.1*  HCT 30.8* 29.7* 28.1*  MCV 88.8 88.7 85.4  MCH 27.7 27.8 27.7  MCHC 31.2 31.3 32.4  RDW  13.9 13.9 13.8  PLT 245 195 208    Cardiac Enzymes Recent Labs  Lab 08/11/18 2353  TROPONINI 0.05*   No results for input(s): TROPIPOC in the last 168 hours.   BNP Recent Labs  Lab 08/11/18 1611  BNP 369.4*     DDimer No results for input(s): DDIMER in the last 168 hours.   Radiology    Dg Wrist Complete Left  Result Date: 08/11/2018 CLINICAL DATA:  Left wrist pain after fall today. EXAM: LEFT WRIST - COMPLETE 3+ VIEW COMPARISON:  None. FINDINGS: Moderately displaced ulnar styloid fracture is noted. Posteriorly displaced and probably comminuted fracture of distal right radius is noted. Vascular calcifications are noted. IMPRESSION: Posteriorly displaced and probably comminuted distal right radial fracture. Moderately displaced ulnar styloid fracture. Electronically Signed   By: [name], M.D.   On: 08/11/2018 15:45   Ct Head Wo Contrast  Result Date: 08/11/2018 CLINICAL DATA:  Fall, laceration and  hematoma over the left frontal orbital region. EXAM: CT HEAD WITHOUT CONTRAST TECHNIQUE: Contiguous axial images were obtained from the base of the skull through the vertex without intravenous contrast. COMPARISON:  None. FINDINGS: Brain: Advanced brain atrophy and chronic white matter microvascular changes with ventricular enlargement. No acute intracranial hemorrhage, mass lesion, definite new infarction, midline shift, herniation, hydrocephalus, or extra-axial fluid collection. No focal mass effect or edema. Cisterns are patent. Cerebellar atrophy as well. Vascular: Intracranial atherosclerosis noted.  No hyperdense vessel. Skull: Intact skull. Anterior left frontal orbital scalp bruising/hematoma noted. Sinuses/Orbits: Sinuses remain clear. No other orbital abnormality. No proptosis. Other: None. IMPRESSION: Anterior frontal orbital soft tissue swelling/bruising. Atrophy and chronic white matter microvascular changes. No acute intracranial finding by noncontrast CT. Electronically Signed   By: Jerilynn Mages.  Shick M.D.   On: 08/11/2018 15:32   Dg Chest Portable 1 View  Result Date: 08/11/2018 CLINICAL DATA:  84 year old who fell onto concrete earlier today sustaining lacerations to the head and LEFT hand. Current history of RIGHT UPPER LOBE lung cancer. EXAM: PORTABLE CHEST 1 VIEW COMPARISON:  CT chest 07/02/2018, 04/15/2018 and earlier. Chest x-rays 06/01/2018, 02/23/2018 and earlier. FINDINGS: Cardiac silhouette markedly enlarged, unchanged. LEFT subclavian biventricular pacemaker unchanged and appears intact. Thoracic aorta atherosclerotic, unchanged. RIGHT hilar/suprahilar mass, not significantly changed since the CT 07/02/2018. Post radiation fibrosis and bronchiectasis in the RIGHT UPPER LOBE which has shown further evolution since that CT. No new pulmonary parenchymal abnormalities in either lung. Emphysematous changes throughout both lungs as noted previously. IMPRESSION: 1.  No acute cardiopulmonary disease.  2. RIGHT hilar/suprahilar mass which is essentially stable since the CT 07/02/2018. 3. Evolving post radiation fibrosis involving the RIGHT UPPER LOBE. Aortic Atherosclerosis (ICD10-I70.0) and Emphysema (ICD10-J43.9). Electronically Signed   By: Evangeline Dakin M.D.   On: 08/11/2018 18:22   Dg Hand Complete Left  Result Date: 08/11/2018 CLINICAL DATA:  Fall EXAM: LEFT HAND - COMPLETE 3+ VIEW COMPARISON:  None. FINDINGS: Osteopenia. There is deformity at the base of the proximal phalanx of the small finger. In impaction type fracture is not excluded. There is also deformity of the distal radius that is poorly visualized worrisome for a comminuted distal radius fracture. IMPRESSION: Possible fracture at the base of the proximal phalanx of the small finger. Dedicated small finger views may be helpful. Comminuted displaced fracture of the distal radius is suspected. A wrist radiograph series is recommended. Electronically Signed   By: Marybelle Killings M.D.   On: 08/11/2018 15:07   Dg Finger Little Left  Result Date:  08/11/2018 CLINICAL DATA:  Left fifth finger pain after fall. EXAM: LEFT LITTLE FINGER 2+V COMPARISON:  Radiographs of same day. FINDINGS: Probable nondisplaced fracture is seen involving the proximal base of the fifth proximal phalanx. Joint spaces are unremarkable. IMPRESSION: Probable nondisplaced fracture involving proximal base of fifth proximal phalanx. Electronically Signed   By: [name], M.D.   On: 08/11/2018 15:47    Cardiac Studies   EF 35 to 40%  Patient Profile     84 y.o. male with chronic systolic heart failure, biventricular CRT pacemaker, Saint Jude, EF 35% chronic kidney disease with mechanical fall, above eye laceration, left wrist/hand injury to be repaired by Dr. Apolonio Schneiders.  Assessment & Plan    Chronic systolic heart failure - Stopping Lasix IV today.  Creatinine increased to 2.9.  Baseline weight 203 pounds -Repleting potassium 3.2.  K 40 mEq - Still appears  comfortable, laying flat.  Chronic wheezing noted, on prednisone. -May proceed with surgery.  CRT-P -Saint Jude.  He does not have a defibrillator.  Pacing function only.  Paroxysmal atrial fibrillation -Eliquis is currently on hold.  He has not had any evidence of atrial fibrillation since CRT implant.  He is also on low-dose amiodarone.  Elevated troponin -Low level 0.05, demand ischemia in the setting of underlying chronic kidney disease, chronic systolic heart failure.  Sarcoidosis -Prednisone, chronic mild wheezing  Hand/wrist injury -Dr. Apolonio Schneiders will be taking him to surgery today.  Diabetes type 2 with chronic kidney disease stage III/IV - Primary team following closely.  Appreciate.  We will follow along, plan on restarting home dose diuretic tomorrow.     For questions or updates, please contact [place] Please consult [email or web address] for contact info under        Signed, Candee Furbish, MD  08/13/2018, 8:56 AM

## 2018-08-13 NOTE — Evaluation (Signed)
Occupational Therapy Evaluation Patient Details Name: Ruben Conner, DDS MRN: [id number] DOB: 03-08-1930 Today's Date: 08/13/2018    History of Present Illness Mr. [name] is a 84 year old male with chronic systolic heart failure, dual-chamber CRT-P device, Saint Jude, Dr. Lovena Le, with EF of 35%, 98% biventricular pacing with no atrial fibrillation who previously received primary bronchogenic carcinoma therapy central right upper lobe with radiation, chronic kidney disease 2.7 creatinine, dementia of his wife, Ruben Conner, who unfortunately suffered a fall and injured hand, wrist significantly requiring surgical repair by Dr. Apolonio Schneiders.  Pt reports his knees gave out on him causing him to fall.    Clinical Impression   Pt admitted with the above diagnoses and presents with below problem list. Pt will benefit from continued acute OT to address the below listed deficits and maximize independence with basic ADLs prior to d/c to venue below. 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 Pt is currently mod A with bed mobility and UB ADLs, max A with LB ADLs, min-mod A +2 for safety with toilet transfers and functional mobility.      Follow Up Recommendations  SNF;Supervision/Assistance - 24 hour    Equipment Recommendations  None recommended by OT    Recommendations for Other Services       Precautions / Restrictions Precautions Precautions: Fall Restrictions Weight Bearing Restrictions: Yes LUE Weight Bearing: Non weight bearing Other Position/Activity Restrictions: NWB L wrist      Mobility Bed Mobility Overal bed mobility: Needs Assistance Bed Mobility: Rolling;Sidelying to Sit Rolling: Mod assist Sidelying to sit: Mod assist Supine to sit: Mod assist     General bed mobility comments: Pt needed mod assist to come to eOB with pt pulling up on PT.  He could not push up enough on elbows to come all the way to sitting without assist.  Transfers Overall transfer level:  Needs assistance Equipment used: 1 person hand held assist(started with pt weight bear on left elbow/right HHA) Transfers: Sit to/from [employer] Transfers Sit to Stand: Mod assist;+2 safety/equipment Stand pivot transfers: Mod assist;Min assist;From elevated surface       General transfer comment: Pt needed mod assist to power up and steady with posterior lean and  poor postural stability in standing.    Balance Overall balance assessment: Needs assistance Sitting-balance support: No upper extremity supported;Feet supported Sitting balance-Leahy Scale: Fair     Standing balance support: Single extremity supported;During functional activity Standing balance-Leahy Scale: Poor Standing balance comment: relies on UE and external support for balance.                            ADL either performed or assessed with clinical judgement   ADL Overall ADL's : Needs assistance/impaired Eating/Feeding: Set up;Minimal assistance;Sitting   Grooming: Moderate assistance;Sitting   Upper Body Bathing: Moderate assistance;Sitting   Lower Body Bathing: Maximal assistance;Sit to/from stand   Upper Body Dressing : Moderate assistance;Sitting   Lower Body Dressing: Maximal assistance;Sit to/from stand   Toilet Transfer: Moderate assistance;Ambulation;+2 for safety/equipment   Toileting- Clothing Manipulation and Hygiene: Moderate assistance;Sit to/from stand;Maximal assistance       Functional mobility during ADLs: Moderate assistance;+2 for safety/equipment;Minimal assistance General ADL Comments: Pt completed bed mobility and in room functional mobility.      Vision         Perception     Praxis      Pertinent Vitals/Pain Pain Assessment: Faces Faces Pain Scale:  Hurts little more Pain Location: left wrist Pain Descriptors / Indicators: Aching;Grimacing;Guarding Pain Intervention(s): Limited activity within patient's tolerance;Monitored during  session;Repositioned     Hand Dominance     Extremity/Trunk Assessment Upper Extremity Assessment Upper Extremity Assessment: Generalized weakness;LUE deficits/detail LUE Deficits / Details: forearm and distal in splint with Ace wrap in place. ORIF planned for this afternoon.  LUE: Unable to fully assess due to immobilization   Lower Extremity Assessment Lower Extremity Assessment: Defer to PT evaluation   Cervical / Trunk Assessment Cervical / Trunk Assessment: Kyphotic   Communication Communication Communication: No difficulties   Cognition Arousal/Alertness: Awake/alert Behavior During Therapy: Flat affect Overall Cognitive Status: Within Functional Limits for tasks assessed                                     General Comments  Placed left UE on pillow for edema control.     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  He cooks, cleans, and has to provide HHA for her when walking.  Daughter in law is helping currently.  He has had a lady who comes 8 hours day to assist them as well.        Prior Functioning/Environment Level of Independence: Independent                 OT Problem List: Impaired balance (sitting and/or standing);Decreased knowledge of use of DME or AE;Decreased knowledge of precautions;Pain;Impaired UE functional use      OT Treatment/Interventions: Self-care/ADL  training;DME and/or AE instruction;Therapeutic activities;Patient/family education;Balance training    OT Goals(Current goals can be found in the care plan section) Acute Rehab OT Goals Patient Stated Goal: to go home and be able to get around OT Goal Formulation: With patient Time For Goal Achievement: 08/20/18 Potential to Achieve Goals: Good ADL Goals Pt Will Perform Grooming: with set-up;with modified independence;sitting Pt Will Perform Upper Body Bathing: with set-up;with modified independence;sitting Pt Will Perform Lower Body Bathing: with min guard assist;sit to/from stand Pt Will Perform Upper Body Dressing: with modified independence;with set-up;sitting Pt Will Perform Lower Body Dressing: with min guard assist;sit to/from stand Pt Will Transfer to Toilet: with min guard assist;ambulating Pt Will Perform Toileting - Clothing Manipulation and hygiene: with min assist;sit to/from stand Additional ADL Goal #1: Pt will complete bed mobility at supervision level to prepare for OOB ADLs  OT Frequency: Min 2X/week   Barriers to D/C:            Co-evaluation PT/OT/SLP Co-Evaluation/Treatment: Yes Reason for Co-Treatment: For patient/therapist safety PT goals addressed during session: Mobility/safety with mobility OT goals addressed during session: ADL's and self-care      AM-PAC PT "6 Clicks" Daily Activity     Outcome Measure Help from another person eating meals?: A Little Help from another person taking care of personal grooming?: A Lot Help from another person  toileting, which includes using toliet, bedpan, or urinal?: A Lot Help from another person bathing (including washing, rinsing, drying)?: A Lot Help from another person to put on and taking off regular upper body clothing?: A Lot Help from another person to put on and taking off regular lower body clothing?: A Lot 6 Click Score: 13   End of Session Equipment Utilized During Treatment: Gait belt  Activity  Tolerance: Patient tolerated treatment well Patient left: in chair;with call bell/phone within reach;with chair alarm set  OT Visit Diagnosis: Unsteadiness on feet (R26.81);Muscle weakness (generalized) (M62.81);History of falling (Z91.81);Pain Pain - Right/Left: Left Pain - part of body: Arm                Time: 4599-7741 OT Time Calculation (min): 21 min Charges:  OT General Charges $OT Visit: 1 Visit OT Evaluation $OT Eval Low Complexity: [place], OT Acute Rehabilitation Services Pager: [phone] Office: [phone]   Hortencia Pilar 08/13/2018, 12:31 PM

## 2018-08-13 NOTE — Consult Note (Signed)
NAME:  Ruben Conner, Hoe DDS, MRN:  [id number], DOB:  08-Jan-1930, LOS: 2 ADMISSION DATE:  08/11/2018, CONSULTATION DATE:  9/27 REFERRING MD:  Tyrell Antonio, CHIEF COMPLAINT:  cough   Brief History   84 year old male followed by RA for sarcoidosis who is on chronic prednisone admitted to [facility] after mechanical fall with L wrist injury. He was scheduled for surgery and pre-op pulmonary clearance requested for cough.   Past Medical History  Sarcoidosis, radiation pneumonitis RUL, NICM with HFrEF 35%, CKD.   Significant Hospital Events   9/25 admit 9/27 surgery  Consults: date of consult/date signed off & final recs:  Ortho Cardiology Pulm  Procedures (surgical and bedside):  ORIF 9/7 L hand/wrist  Significant Diagnostic Tests:    Micro Data:    Antimicrobials:     Subjective:  Breathing about at baseline. Cough about at baseline. Better after Delsym.   Objective   Blood pressure (!) 143/100, pulse 85, temperature 98.4 F (36.9 C), temperature source Oral, resp. rate 18, height 6' (1.829 m), weight 96.1 kg, SpO2 93 %.        Intake/Output Summary (Last 24 hours) at 08/13/2018 1057 Last data filed at 08/13/2018 0739 Gross per 24 hour  Intake 600 ml  Output 2600 ml  Net -2000 ml   Filed Weights   08/11/18 1409 08/12/18 0013 08/13/18 0344  Weight: 96.6 kg 96.9 kg 96.1 kg    Examination: General: elderly male of normal body habitus in NAD HENT: Carbon/AT, PERRL, no JVD Lungs: Scattered crackles R>L Cardiovascular: RRR, no MRG Abdomen: Soft, non-tender, non-distended Extremities: No acute deformity or ROM limitation. Trace edema.  Neuro: Alert, oriented, non-focal.   Resolved Hospital Problem list     Assessment & Plan:   Pulmonary Sarcoidosis - Continue prednisone 5mg  daily.  - PRN supplemental O2 for SpO2 goal > 90%  Cough: chronic, seems to be at baseline.  - PRN delsym  Chronic HFrEF PAF - Management per cardiology  L wrist fracture and L hand  degloving with tendon injury - per ortho, for ORIF today.   CKD IV - per primary  DM - per primary  Disposition / Summary of Today's Plan 08/13/18   Supportive care. Delsym PRN for cough. For ortho surgery today.   He is a reasonable candidate for surgery today. Pulmonary conditions should not preclude him from having L wrist/hand repair.     Diet: NPO for surgery DVT prophylaxis: SCDs GI prophylaxis: Na Hyperglycemia protocol: SSI Mobility: with assist Code Status: Full Family Communication: Patient updated   Labs   CBC: Recent Labs  Lab 08/11/18 1611 08/12/18 0531 08/13/18 0309  WBC 8.5 7.8 9.5  NEUTROABS 7.1  --   --   HGB 9.6* 9.3* 9.1*  HCT 30.8* 29.7* 28.1*  MCV 88.8 88.7 85.4  PLT 245 195 174    Basic Metabolic Panel: Recent Labs  Lab 08/11/18 1611 08/12/18 0531 08/13/18 0309  NA 140 139 136  K 4.2 3.9 3.2*  CL 104 105 99  CO2 27 25 27   GLUCOSE 199* 127* 132*  BUN 23 23 28*  CREATININE 2.78* 2.71* 2.92*  CALCIUM 9.0 8.4* 8.6*  MG  --  1.5* 2.0  PHOS  --  3.8  --    GFR: Estimated Creatinine Clearance: 21.8 mL/min (A) (by C-G formula based on SCr of 2.92 mg/dL (H)). Recent Labs  Lab 08/11/18 1611 08/12/18 0531 08/13/18 0309  WBC 8.5 7.8 9.5    Liver Function Tests: Recent  Labs  Lab 08/12/18 0531  AST 16  ALT 10  ALKPHOS 52  BILITOT 0.7  PROT 6.0*  ALBUMIN 3.1*   No results for input(s): LIPASE, AMYLASE in the last 168 hours. No results for input(s): AMMONIA in the last 168 hours.  ABG No results found for: PHART, PCO2ART, PO2ART, HCO3, TCO2, ACIDBASEDEF, O2SAT   Coagulation Profile: Recent Labs  Lab 08/11/18 1611  INR 1.10    Cardiac Enzymes: Recent Labs  Lab 08/11/18 2353  TROPONINI 0.05*    HbA1C: Hgb A1c MFr Bld  Date/Time Value Ref Range Status  08/12/2018 05:31 AM 7.5 (H) 4.8 - 5.6 % Final    Comment:    (NOTE) Pre diabetes:          5.7%-6.4% Diabetes:              >6.4% Glycemic control for    <7.0% adults with diabetes   12/08/2017 06:50 AM 5.7 (H) 4.8 - 5.6 % Final    Comment:    (NOTE) Pre diabetes:          5.7%-6.4% Diabetes:              >6.4% Glycemic control for   <7.0% adults with diabetes     CBG: Recent Labs  Lab 08/12/18 1629 08/12/18 2000 08/12/18 2331 08/13/18 0349 08/13/18 0731  GLUCAP 230* 168* 139* 138* 146*    Admitting History of Present Illness.   84 year old male with PMH as below, which is significant for sarcoidosis on prednisone, radiation pneumonitis, CHF (LVEF 35%), and CKD admitted to [facility] 9/25 after mechanical fall. He is followed by Dr Elsworth Soho in the pulmonary clinic initially for hilar mass. Biopsies were negative, but he then had narrowing of the right upper lobe bronchus and underwent empiric radation, which unfortunately left him with some radiation pneumonitis. Finally had CT biopsy of RUL mass in February of this year, which demonstrated non-necrotizing granulomatous inflammation.  He is managed with prednisone 5mg  daily and has a chronic cough, which seems to respond well to delsym.   After admitted for fall he was seen by orthopedics and an ORIF has be scheduled to repair L wrist and hand injury. PCCM has been asked for pre-op consult re: cough.   Review of Systems:   Bolds are positive  Constitutional: weight loss, gain, night sweats, Fevers, chills, fatigue .  HEENT: headaches, Sore throat, sneezing, nasal congestion, post nasal drip, Difficulty swallowing, Tooth/dental problems, visual complaints visual changes, ear ache CV:  chest pain, radiates,Orthopnea, PND, swelling in lower extremities, dizziness, palpitations, syncope.  GI  heartburn, indigestion, abdominal pain, nausea, vomiting, diarrhea, change in bowel habits, loss of appetite, bloody stools.  Resp: cough, productive:, hemoptysis, dyspnea at baseline, chest pain, pleuritic.  Skin: rash or itching or icterus GU: dysuria, change in color of urine, urgency or  frequency. flank pain, hematuria  MS: joint pain or swelling. decreased range of motion  Psych: change in mood or affect. depression or anxiety.  Neuro: difficulty with speech, weakness, numbness, ataxia   Past Medical History  He,  has a past medical history of Arthritis, Cancer ([place]), CHF (congestive heart failure) ([place]), CKD (chronic kidney disease), stage IV ([place]), Fall (08/11/2018), Hyperlipidemia, Hypertension, Irregular heart rate, Pneumonia, Presence of permanent cardiac pacemaker, Renal insufficiency, Sarcoidosis, lung ([place]), and Type II diabetes mellitus ([place]).   Surgical History    Past Surgical History:  Procedure Laterality Date  . BIV PACEMAKER INSERTION CRT-P N/A 05/31/2018   Procedure:  BIV PACEMAKER INSERTION CRT-P;  Surgeon: Evans Lance, MD;  Location: [place] [facility];  Service: Cardiovascular;  Laterality: N/A;  . CATARACT EXTRACTION W/ INTRAOCULAR LENS  IMPLANT, BILATERAL Bilateral   . ENDOBRONCHIAL ULTRASOUND Bilateral 03/23/2017   Procedure: ENDOBRONCHIAL ULTRASOUND;  Surgeon: Rigoberto Noel, MD;  Location: [facility] END[facility];  Service: Cardiopulmonary;  Laterality: Bilateral;  . INSERT / REPLACE / REMOVE PACEMAKER    . LUNG BIOPSY  X 5   "thought it was lung cancer; ended up being sarcoidosis"  . MEDIASTINOSCOPY N/A 04/08/2017   Procedure: MEDIASTINOSCOPY;  Surgeon: Grace Isaac, MD;  Location: [place];  Service: Thoracic;  Laterality: N/A;  . MOUTH SURGERY  "several"   "to fix bone so bridgework fit better"  . 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 PILONIDAL CYST EXCISION  1958  . SHOULDER OPEN ROTATOR CUFF REPAIR Left   . TEE WITHOUT CARDIOVERSION N/A 05/28/2018   Procedure: TRANSESOPHAGEAL ECHOCARDIOGRAM (TEE);  Surgeon: Sanda Klein, MD;  Location: [facility] END[facility];  Service: Cardiovascular;  Laterality: N/A;  . TUMOR EXCISION     "[place] tumor removed off neck under my left  ear"  . VIDEO BRONCHOSCOPY WITH ENDOBRONCHIAL ULTRASOUND N/A 04/08/2017   Procedure: VIDEO BRONCHOSCOPY WITH ENDOBRONCHIAL ULTRASOUND;  Surgeon: Grace Isaac, MD;  Location: [place];  Service: Thoracic;  Laterality: N/A;  . VIDEO BRONCHOSCOPY WITH ENDOBRONCHIAL ULTRASOUND N/A 12/08/2017   Procedure: VIDEO BRONCHOSCOPY with BRONCHIAL BIOPSIES AND ENDOBRONCHIAL ULTRASOUND WITH TRANSBRONCHIAL AND BRONCHIAL BIOPSIES;  Surgeon: Grace Isaac, MD;  Location: [place];  Service: Thoracic;  Laterality: N/A;     Social History   Social History   Socioeconomic History  . Marital status: Married    Spouse name: Not on file  . Number of children: 1  . Years of education: Not on file  . Highest education level: Not on file  Occupational History  . Occupation: Retired Advice worker  . Financial resource strain: Not on file  . Food insecurity:    Worry: Not on file    Inability: Not on file  . Transportation needs:    Medical: Not on file    Non-medical: Not on file  Tobacco Use  . Smoking status: Former Smoker    Packs/day: 0.10    Years: 53.00    Pack years: 5.30    Types: Cigarettes    Last attempt to quit: 11/28/2016    Years since quitting: 1.7  . Smokeless tobacco: Never Used  Substance and Sexual Activity  . Alcohol use: Not Currently    Alcohol/week: 0.0 standard drinks    Comment: 08/12/2018 "nothing in 5 yrs; never drank much"  . Drug use: Never  . Sexual activity: Not Currently  Lifestyle  . Physical activity:    Days per week: Not on file    Minutes per session: Not on file  . Stress: Not on file  Relationships  . Social connections:    Talks on phone: Not on file    Gets together: Not on file    Attends religious service: Not on file    Active member of club or organization: Not on file    Attends meetings of clubs or organizations: Not on file    Relationship status: Not on file  . Intimate partner violence:    Fear of current or ex partner: Not on  file  Emotionally abused: Not on file    Physically abused: Not on file    Forced sexual activity: Not on file  Other Topics Concern  . Not on file  Social History Narrative  . Not on file  ,  reports that he quit smoking about 20 months ago. His smoking use included cigarettes. He has a 5.30 pack-year smoking history. He has never used smokeless tobacco. He reports that he drank alcohol. He reports that he does not use drugs.   Family History   His family history includes Cancer in his son; Heart disease in his father; Kidney disease in his mother; Suicidality in his son.   Allergies No Known Allergies   Home Medications  Prior to Admission medications   Medication Sig Start Date End Date Taking? Authorizing Provider  acetaminophen (TYLENOL) 500 MG tablet Take 1,000 mg by mouth every 6 (six) hours as needed for moderate pain or headache.   Yes [provider]  amiodarone (PACERONE) 200 MG tablet Take 1 tablet (200 mg total) by mouth daily. 05/28/18  Yes Croitoru, Mihai, MD  apixaban (ELIQUIS) 2.5 MG TABS tablet Take 1 tablet (2.5 mg total) by mouth 2 (two) times daily. 05/18/18  Yes Chipper Herb, MD  carvedilol (COREG) 3.125 MG tablet Take 1 tablet (3.125 mg total) by mouth 2 (two) times daily. 06/01/18 06/01/19 Yes Baldwin Jamaica, PA-C  Cholecalciferol (VITAMIN D3) 5000 units CAPS Take 5,000 Units by mouth every evening.    Yes [provider]  glimepiride (AMARYL) 4 MG tablet TAKE 1 TABLET DAILY Patient taking differently: Take 4 mg by mouth daily with breakfast.  04/14/18  Yes Chipper Herb, MD  potassium chloride SA (K-DUR,KLOR-CON) 20 MEQ tablet Take 1-3 tablets (20-60 mEq total) by mouth daily as directed 08/09/18  Yes Croitoru, Mihai, MD  predniSONE (DELTASONE) 10 MG tablet TAKE (1) TABLET DAILY WITH BREAKFAST. Patient taking differently: Take 5 mg by mouth daily.  04/23/18  Yes Rigoberto Noel, MD  torsemide (DEMADEX) 20 MG tablet Take 1-4 tablets (20-80 mg  total) by mouth daily as directed. 08/09/18  Yes Croitoru, Mihai, MD  glucose blood (ONETOUCH VERIO) test strip Twice Daily 01/25/18   Chipper Herb, MD     Georgann Housekeeper, AGACNP-BC [place] Pager [phone] or [phone]  08/13/2018 11:22 AM

## 2018-08-13 NOTE — Anesthesia Postprocedure Evaluation (Signed)
Anesthesia Post Note  Patient: [name], DDS  Procedure(s) Performed: OPEN REDUCTION INTERNAL FIXATION (ORIF) WRIST FRACTURE (Left )     Patient location during evaluation: PACU Anesthesia Type: General and Regional Level of consciousness: awake and alert Pain management: pain level controlled Vital Signs Assessment: post-procedure vital signs reviewed and stable Respiratory status: spontaneous breathing, nonlabored ventilation, respiratory function stable and patient connected to nasal cannula oxygen Cardiovascular status: blood pressure returned to baseline and stable Postop Assessment: no apparent nausea or vomiting Anesthetic complications: no    Last Vitals:  Vitals:   08/13/18 1748 08/13/18 1750  BP: (!) 176/78 (!) 172/81  Pulse: 64 64  Resp: 18 16  Temp:    SpO2: 94% 95%    Last Pain:  Vitals:   08/13/18 1720  TempSrc:   PainSc: 0-No pain                 Rechy Bost,W. EDMOND

## 2018-08-13 NOTE — Progress Notes (Addendum)
Physical Therapy Treatment Patient Details Name: Ruben Conner, Ruben Conner MRN: [id number] DOB: 1930/09/19 Today's Date: 08/13/2018    History of Present Illness Ruben Conner is a 84 year old male with chronic systolic heart failure, dual-chamber CRT-P device, [employer], Dr. Lovena Le, with EF of 35%, 98% biventricular pacing with no atrial fibrillation who previously received primary bronchogenic carcinoma therapy central right upper lobe with radiation, chronic kidney disease 2.7 creatinine, dementia of his wife, Ruben Conner, who unfortunately suffered a fall and injured hand, wrist significantly requiring surgical repair by Dr. Apolonio Schneiders.  Pt reports his knees gave out on him causing him to fall.     PT Comments    Pt admitted with above diagnosis. Pt currently with functional limitations due to balance and endurance deficits. Pt was able to ambulate with HHA and mod assist of 2 persons for safety as he has posterior lean and poor postural stability. Will follow acutely.  Pt will benefit from skilled PT to increase their independence and safety with mobility to allow discharge to the venue listed below.     Follow Up Recommendations  SNF;Supervision/Assistance - 24 hour(wants to go home and hire caregiver, wife disabled too)     Equipment Recommendations  (TBA)    Recommendations for Other Services       Precautions / Restrictions Precautions Precautions: Fall Restrictions Weight Bearing Restrictions: Yes LUE Weight Bearing: Non weight bearing Other Position/Activity Restrictions: NWB wrist    Mobility  Bed Mobility Overal bed mobility: Needs Assistance Bed Mobility: Rolling;Sidelying to Sit Rolling: Mod assist Sidelying to sit: Mod assist Supine to sit: Mod assist     General bed mobility comments: Pt needed mod assist to come to eOB with pt pulling up on PT.  He could not push up enough on elbows to come all the way to sitting without assist.  Transfers Overall transfer level: Needs  assistance Equipment used: 1 person hand held assist(started with pt weight bear on left elbow/right HHA) Transfers: Sit to/from [employer] Transfers Sit to Stand: Mod assist;+2 safety/equipment         General transfer comment: Pt needed mod assist to power up and steady with posterior lean and  poor postural stability in standing.  Ambulation/Gait Ambulation/Gait assistance: Mod assist;+2 safety/equipment Gait Distance (Feet): 45 Feet Assistive device: 1 person hand held assist Gait Pattern/deviations: Step-to pattern;Decreased step length - right;Decreased step length - left;Decreased stride length;Shuffle;Wide base of support;Leaning posteriorly   Gait velocity interpretation: <1.31 ft/sec, indicative of household ambulator General Gait Details: Pt shuffles his feet and needs assist with ambulation for steady gait as he has very guarded gait with posterior lean.  Bil knee instability noted at times.  Pt ambulated with initial left elbow and right HHA and progressed to just right HHA.    Stairs             Wheelchair Mobility    Modified Rankin (Stroke Patients Only)       Balance Overall balance assessment: Needs assistance Sitting-balance support: No upper extremity supported;Feet supported Sitting balance-Leahy Scale: Fair     Standing balance support: Single extremity supported;During functional activity Standing balance-Leahy Scale: Poor Standing balance comment: relies on UE and external support for balance.                             Cognition Arousal/Alertness: Awake/alert Behavior During Therapy: Flat affect Overall Cognitive Status: Within Functional Limits for tasks assessed  Exercises      General Comments General comments (skin integrity, edema, etc.): Placed left UE on pillow for edema control.       Pertinent Vitals/Pain Pain Assessment: Faces Faces Pain Scale:  Hurts little more Pain Location: left wrist Pain Descriptors / Indicators: Aching;Grimacing;Guarding Pain Intervention(s): Limited activity within patient's tolerance;Monitored during session;Repositioned  VSS  Home Living                      Prior Function            PT Goals (current goals can now be found in the care plan section) Acute Rehab PT Goals Patient Stated Goal: to go home and be able to get around Progress towards PT goals: Progressing toward goals    Frequency    Min 3X/week      PT Plan Current plan remains appropriate    Co-evaluation PT/OT/SLP Co-Evaluation/Treatment: Yes Reason for Co-Treatment: For patient/therapist safety PT goals addressed during session: Mobility/safety with mobility        AM-PAC PT "6 Clicks" Daily Activity  Outcome Measure  Difficulty turning over in bed (including adjusting bedclothes, sheets and blankets)?: Unable Difficulty moving from lying on back to sitting on the side of the bed? : Unable Difficulty sitting down on and standing up from a chair with arms (e.g., wheelchair, bedside commode, etc,.)?: Unable Help needed moving to and from a bed to chair (including a wheelchair)?: A Lot Help needed walking in hospital room?: A Lot Help needed climbing 3-5 steps with a railing? : A Lot 6 Click Score: 9    End of Session Equipment Utilized During Treatment: Gait belt Activity Tolerance: Patient limited by fatigue Patient left: in chair;with call bell/phone within reach;with chair alarm set Nurse Communication: Mobility status PT Visit Diagnosis: Unsteadiness on feet (R26.81);Muscle weakness (generalized) (M62.81);Pain;History of falling (Z91.81) Pain - Right/Left: Left Pain - part of body: (wrist)     Time: 4114-6431 PT Time Calculation (min) (ACUTE ONLY): 20 min  Charges:  $Gait Training: 8-22 mins                     [place] Pager:  [phone]  Office:   [place] 08/13/2018, 12:10 PM

## 2018-08-14 DIAGNOSIS — D869 Sarcoidosis, unspecified: Secondary | ICD-10-CM

## 2018-08-14 DIAGNOSIS — D631 Anemia in chronic kidney disease: Secondary | ICD-10-CM

## 2018-08-14 DIAGNOSIS — E1122 Type 2 diabetes mellitus with diabetic chronic kidney disease: Secondary | ICD-10-CM

## 2018-08-14 DIAGNOSIS — N183 Chronic kidney disease, stage 3 (moderate): Secondary | ICD-10-CM

## 2018-08-14 DIAGNOSIS — I5023 Acute on chronic systolic (congestive) heart failure: Secondary | ICD-10-CM

## 2018-08-14 DIAGNOSIS — E7800 Pure hypercholesterolemia, unspecified: Secondary | ICD-10-CM

## 2018-08-14 DIAGNOSIS — I480 Paroxysmal atrial fibrillation: Secondary | ICD-10-CM

## 2018-08-14 LAB — BASIC METABOLIC PANEL
Anion gap: 10 (ref 5–15)
BUN: 36 mg/dL — AB (ref 8–23)
CHLORIDE: 99 mmol/L (ref 98–111)
CO2: 26 mmol/L (ref 22–32)
CREATININE: 2.95 mg/dL — AB (ref 0.61–1.24)
Calcium: 8.2 mg/dL — ABNORMAL LOW (ref 8.9–10.3)
GFR calc Af Amer: 21 mL/min — ABNORMAL LOW (ref >60)
GFR, EST NON AFRICAN AMERICAN: 18 mL/min — AB (ref >60)
Glucose, Bld: 151 mg/dL — ABNORMAL HIGH (ref 70–99)
Potassium: 3.8 mmol/L (ref 3.5–5.1)
SODIUM: 135 mmol/L (ref 135–145)

## 2018-08-14 LAB — CBC
HCT: 27.1 % — ABNORMAL LOW (ref 39.0–52.0)
Hemoglobin: 8.6 g/dL — ABNORMAL LOW (ref 13.0–17.0)
MCH: 27.6 pg (ref 26.0–34.0)
MCHC: 31.7 g/dL (ref 30.0–36.0)
MCV: 86.9 fL (ref 78.0–100.0)
PLATELETS: 203 10*3/uL (ref 150–400)
RBC: 3.12 MIL/uL — ABNORMAL LOW (ref 4.22–5.81)
RDW: 14 % (ref 11.5–15.5)
WBC: 9 10*3/uL (ref 4.0–10.5)

## 2018-08-14 LAB — GLUCOSE, CAPILLARY
GLUCOSE-CAPILLARY: 100 mg/dL — AB (ref 70–99)
GLUCOSE-CAPILLARY: 255 mg/dL — AB (ref 70–99)
Glucose-Capillary: 122 mg/dL — ABNORMAL HIGH (ref 70–99)
Glucose-Capillary: 205 mg/dL — ABNORMAL HIGH (ref 70–99)
Glucose-Capillary: 165 mg/dL — ABNORMAL HIGH (ref 70–99)
Glucose-Capillary: 133 mg/dL — ABNORMAL HIGH (ref 70–99)

## 2018-08-14 MED ORDER — SENNA 8.6 MG PO TABS
1.0000 | ORAL_TABLET | Freq: Two times a day (BID) | ORAL | Status: DC
  Administered 2018-08-14 – 2018-08-17 (×7): 8.6 mg via ORAL
  Filled 2018-08-14 (×7): qty 1

## 2018-08-14 MED ORDER — BISACODYL 10 MG RE SUPP: 10.0000 mg | Freq: Every day | RECTAL | Status: DC | PRN

## 2018-08-14 MED ORDER — POLYETHYLENE GLYCOL 3350 17 G PO PACK
17.0000 g | PACK | Freq: Two times a day (BID) | ORAL | Status: DC
  Administered 2018-08-14 – 2018-08-17 (×7): 17 g via ORAL
  Filled 2018-08-14 (×7): qty 1

## 2018-08-14 MED ORDER — APIXABAN 2.5 MG PO TABS
2.5000 mg | ORAL_TABLET | Freq: Two times a day (BID) | ORAL | Status: DC
  Administered 2018-08-14: 2.5 mg via ORAL
  Filled 2018-08-14: qty 1

## 2018-08-14 MED ORDER — IPRATROPIUM-ALBUTEROL 0.5-2.5 (3) MG/3ML IN SOLN: 3.0000 mL | Freq: Four times a day (QID) | RESPIRATORY_TRACT | Status: DC | PRN

## 2018-08-14 NOTE — Progress Notes (Signed)
Progress Note  Patient Name: Ruben Conner, DDS Date of Encounter: 08/14/2018  Primary Cardiologist: Sanda Klein, MD   Subjective   Generally feeling well.  Has not had a lot of pain in his wrist and has only required analgesics once today.  Denies dyspnea at rest. Maintained negative urine output even after discontinuation of diuretics, although weight reportedly up 3 pounds today compared to yesterday.  Net -3.8 L since admission. Creatinine unchanged 2.95, potassium normal.  Inpatient Medications    Scheduled Meds: . amiodarone  200 mg Oral Daily  . carvedilol  3.125 mg Oral BID WC  . guaiFENesin  600 mg Oral BID  . insulin aspart  0-9 Units Subcutaneous Q4H  . polyethylene glycol  17 g Oral BID  . predniSONE  5 mg Oral Q breakfast  . senna  1 tablet Oral BID  . sodium chloride flush  3 mL Intravenous Q12H   Continuous Infusions: . sodium chloride    . sodium chloride    . sodium chloride 10 mL/hr at 08/13/18 1506  .  ceFAZolin (ANCEF) IV 1 g (08/14/18 0532)   PRN Meds: sodium chloride, acetaminophen **OR** acetaminophen, bisacodyl, dextromethorphan, fentaNYL (SUBLIMAZE) injection, hydrALAZINE, HYDROcodone-acetaminophen, ipratropium-albuterol, ondansetron **OR** ondansetron (ZOFRAN) IV, sodium chloride flush, zolpidem   Vital Signs    Vitals:   08/13/18 1748 08/13/18 1750 08/13/18 2019 08/14/18 0405  BP: (!) 176/78 (!) 172/81 (!) 150/62   Pulse: 64 64 64   Resp: 18 16    Temp:   99 F (37.2 C) 97.6 F (36.4 C)  TempSrc:   Oral   SpO2: 94% 95% 94%   Weight:    97.4 kg  Height:        Intake/Output Summary (Last 24 hours) at 08/14/2018 1326 Last data filed at 08/14/2018 0542 Gross per 24 hour  Intake 620 ml  Output 1027 ml  Net -407 ml   Filed Weights   08/12/18 0013 08/13/18 0344 08/14/18 0405  Weight: 96.9 kg 96.1 kg 97.4 kg    Telemetry    100% biventricular paced rhythm, at times appears to have atrial pacing, today appears to be in atrial  fibrillation,- Personally Reviewed  ECG    Sinus rhythm with atrial sensed-biventricular paced rhythm- Personally Reviewed  Physical Exam  Appears well GEN: No acute distress. L orbital ecchymosis   Neck: No JVD Cardiac: RRR, no murmurs, rubs, or gallops.  Respiratory: Clear to auscultation bilaterally. GI: Soft, nontender, non-distended  MS: No edema; No deformity.  Cast left wrist without evidence of hematoma or active bleeding. Neuro:  Nonfocal  Psych: Normal affect   Labs    Chemistry Recent Labs  Lab 08/12/18 0531 08/13/18 0309 08/14/18 0516  NA 139 136 135  K 3.9 3.2* 3.8  CL 105 99 99  CO2 25 27 26   GLUCOSE 127* 132* 151*  BUN 23 28* 36*  CREATININE 2.71* 2.92* 2.95*  CALCIUM 8.4* 8.6* 8.2*  PROT 6.0*  --   --   ALBUMIN 3.1*  --   --   AST 16  --   --   ALT 10  --   --   ALKPHOS 52  --   --   BILITOT 0.7  --   --   GFRNONAA 20* 18* 18*  GFRAA 23* 21* 21*  ANIONGAP 9 10 10      Hematology Recent Labs  Lab 08/12/18 0531 08/13/18 0309 08/14/18 0516  WBC 7.8 9.5 9.0  RBC 3.35* 3.29* 3.12*  HGB 9.3* 9.1* 8.6*  HCT 29.7* 28.1* 27.1*  MCV 88.7 85.4 86.9  MCH 27.8 27.7 27.6  MCHC 31.3 32.4 31.7  RDW 13.9 13.8 14.0  PLT 195 208 203    Cardiac Enzymes Recent Labs  Lab 08/11/18 2353  TROPONINI 0.05*   No results for input(s): TROPIPOC in the last 168 hours.   BNP Recent Labs  Lab 08/11/18 1611  BNP 369.4*     DDimer No results for input(s): DDIMER in the last 168 hours.   Radiology    No results found.  Cardiac Studies   Echo 08/13/2018 Study Conclusions  - Left ventricle: The cavity size was normal. Wall thickness was   increased in a pattern of moderate LVH. Systolic function was   severely reduced. The estimated ejection fraction was in the   range of 25% to 30%. Diffuse hypokinesis. Doppler parameters are   consistent with abnormal left ventricular relaxation (grade 1   diastolic dysfunction). - Aortic root: The aortic root  was mildly dilated.  Impressions:  - Severe global reduction in LV systolic function; moderate LVH;   mild diastolic dysfunction; mildly dilated aortic root.  Patient Profile     84 y.o. male with chronic systolic heart failure with severely depressed left ventricular systolic function, CRT-D, paroxysmal atrial fibrillation, sarcoidosis, CKD stage IV, hypertension hypercholesterolemia, admitted after a fall complicated by left wrist fracture  Assessment & Plan    1. CHF: Weight remains above estimated "dry weight", but diuretics held due to worsening renal function.  Recheck tomorrow.  Etiology remains uncertain (?painless coronary artery disease, cardiac sarcoidosis, tachycardia-related), but evaluation is challenging due to renal dysfunction, which makes coronary angiography or administration of gadolinium contrast undesirable. 2. AFib: Monitor look suspicious for possible underlying atrial fibrillation, but still has 100% biventricular pacing.  He is on chronic amiodarone.  Anticoagulated with Eliquis. 3. CRT-D: If he is still here on Monday we will try to see whether we can adjust his LV pacing to avoid the diaphragmatic stimulation that he experiences occasionally. 4. Sarcoidosis: On chronic prednisone.     For questions or updates, please contact [place] Please consult [email or web address] for contact info under        Signed, Sanda Klein, MD  08/14/2018, 1:26 PM

## 2018-08-14 NOTE — Discharge Instructions (Signed)
KEEP BANDAGE CLEAN AND DRY CALL OFFICE FOR F/U APPT [phone] in 14 days KEEP HAND ELEVATED ABOVE HEART OK TO APPLY ICE TO OPERATIVE AREA CONTACT OFFICE IF ANY WORSENING PAIN OR CONCERNS.  Information on my medicine - ELIQUIS (apixaban)  This medication education was reviewed with me or my healthcare representative as part of my discharge preparation.    Why was Eliquis prescribed for you? Eliquis was prescribed for you to reduce the risk of forming blood clots that can cause a stroke if you have a medical condition called atrial fibrillation (a type of irregular heartbeat) OR to reduce the risk of a blood clots forming after orthopedic surgery.  What do You need to know about Eliquis ? Take your Eliquis TWICE DAILY - one tablet in the morning and one tablet in the evening with or without food.  It would be best to take the doses about the same time each day.  If you have difficulty swallowing the tablet whole please discuss with your pharmacist how to take the medication safely.  Take Eliquis exactly as prescribed by your doctor and DO NOT stop taking Eliquis without talking to the doctor who prescribed the medication.  Stopping may increase your risk of developing a new clot or stroke.  Refill your prescription before you run out.  After discharge, you should have regular check-up appointments with your healthcare provider that is prescribing your Eliquis.  In the future your dose may need to be changed if your kidney function or weight changes by a significant amount or as you get older.  What do you do if you miss a dose? If you miss a dose, take it as soon as you remember on the same day and resume taking twice daily.  Do not take more than one dose of ELIQUIS at the same time.  Important Safety Information A possible side effect of Eliquis is bleeding. You should call your healthcare provider right away if you experience any of the following: ? Bleeding from an injury or your  nose that does not stop. ? Unusual colored urine (red or dark brown) or unusual colored stools (red or black). ? Unusual bruising for unknown reasons. ? A serious fall or if you hit your head (even if there is no bleeding).  Some medicines may interact with Eliquis and might increase your risk of bleeding or clotting while on Eliquis. To help avoid this, consult your healthcare provider or pharmacist prior to using any new prescription or non-prescription medications, including herbals, vitamins, non-steroidal anti-inflammatory drugs (NSAIDs) and supplements.  This website has more information on Eliquis (apixaban): [email or web address].

## 2018-08-14 NOTE — Progress Notes (Signed)
PROGRESS NOTE    Ruben Conner, DDS  PJS:[id number] DOB: 04-Apr-1930 DOA: 08/11/2018 PCP: Chipper Herb, MD    Brief Narrative:  84 y.o. male with medical history significant oflong-standing asymptomatic bradycardia, pulmonary and mediastinal sarcoidosis, pulmonary and mediastinal sarcoidosis,type 2 diabetes mellitus, systolic CHF  right upper lobe radiation pneumonitis. CKD stage IV cr at baseline 2-2.5, HTN, LBBB  Admitted for fall resulting in left wrist fracture in association with acute on chronic systolic CHF.    Assessment & Plan:   Active Problems:   Type 2 diabetes mellitus with stage 3 chronic kidney disease, without long-term current use of insulin (HCC)   Hypercholesterolemia   Essential hypertension   Anemia in stage 4 chronic kidney disease (HCC)   Sarcoidosis   CKD (chronic kidney disease) stage 4, GFR 15-29 ml/min (HCC)   Acute systolic heart failure (HCC)   Paroxysmal atrial fibrillation (HCC)   Biventricular automatic implantable cardioverter defibrillator in situ   CHF exacerbation (HCC)   Left wrist fracture, closed, initial encounter   Degloving injury of left hand   Acute on Chronic systolic Heart failure; evaluated by cardiology.  Treated with IV lasix. 60 mg BID> holding lasix today due to increase in creatinine.  Presented with SOB, abdominal distension.  Cardiology following. ECHO EF 30%  Monitor renal function.  Weight 213. ---211( per patient he take torsemide 20 mg when his weight is at 197, if his weight is around 260 he takes 80 mg torsemide). Follow cardio recommendation regarding diuretics.   Sarcoidosis; continue with prednisone.  sarcoidosis. Hilar mass.  Post radiation pneumonitis.  Report cough, congestion after air was turn down.  Will start nebulizer PRN.  Appreciate pre op evaluation from pulmonologist.    Chronic kidney diseases stage IV;  Cr baseline; 2.1 Avoid nephrotoxin.  Cr stable at 2.9   Anemia;  Hb baseline  9--10. Hb today 8. 6---from 9 Will check iron and B 12 level  Drop might be post op.   HTN;  Continue with carvedilol, PRNH hydralazine.   Paroxysmal A fib.  Biventricular automatic implantable cardioverter defibrillator in situ  Holding eliquis pre op. Will discussed with Dr Caralyn Guile regarding resumption of eliquis.  Continue with amiodarone and carvedilol.    DM type 2.  SSI.   Left Wrist Fracture;  Clear by cardiology.  Underwent sx 9-27 Pain controlled.   Hypomagnesemia; received IV magnesium.  Repeat level.   Hypokalemia; resolved.   Constipation; bowel regimen ordered.    DVT prophylaxis: scd Code Status: full code.  Family Communication; no family at bedside.  Disposition Plan:  Remain inpatient for treatment of HR, aki, sx.    Consultants:   Cardiology    Procedures:   none  Antimicrobials:    Subjective: He is breathing ok. No BM.  Pain is controlled. Cough better  Think nebulizer help   Objective: Vitals:   08/13/18 1748 08/13/18 1750 08/13/18 2019 08/14/18 0405  BP: (!) 176/78 (!) 172/81 (!) 150/62   Pulse: 64 64 64   Resp: 18 16    Temp:   99 F (37.2 C) 97.6 F (36.4 C)  TempSrc:   Oral   SpO2: 94% 95% 94%   Weight:    97.4 kg  Height:        Intake/Output Summary (Last 24 hours) at 08/14/2018 0759 Last data filed at 08/14/2018 0542 Gross per 24 hour  Intake 620 ml  Output 1027 ml  Net -407 ml   [employer]  08/12/18 0013 08/13/18 0344 08/14/18 0405  Weight: 96.9 kg 96.1 kg 97.4 kg    Examination:  General exam: NAD. Left periorbital hematoma improved Respiratory system: less ronchus.  Cardiovascular system: S 1, S 2 Gastrointestinal system: BS present, soft, nt Central nervous system: Non focal.   Extremities: left Arm with dressing.  Skin: no rashes.     Data Reviewed: I have personally reviewed following labs and imaging studies  CBC: Recent Labs  Lab 08/11/18 1611 08/12/18 0531 08/13/18 0309  08/14/18 0516  WBC 8.5 7.8 9.5 9.0  NEUTROABS 7.1  --   --   --   HGB 9.6* 9.3* 9.1* 8.6*  HCT 30.8* 29.7* 28.1* 27.1*  MCV 88.8 88.7 85.4 86.9  PLT 245 195 208 416   Basic Metabolic Panel: Recent Labs  Lab 08/11/18 1611 08/12/18 0531 08/13/18 0309 08/14/18 0516  NA 140 139 136 135  K 4.2 3.9 3.2* 3.8  CL 104 105 99 99  CO2 27 25 27 26   GLUCOSE 199* 127* 132* 151*  BUN 23 23 28* 36*  CREATININE 2.78* 2.71* 2.92* 2.95*  CALCIUM 9.0 8.4* 8.6* 8.2*  MG  --  1.5* 2.0  --   PHOS  --  3.8  --   --    GFR: Estimated Creatinine Clearance: 21.7 mL/min (A) (by C-G formula based on SCr of 2.95 mg/dL (H)). Liver Function Tests: Recent Labs  Lab 08/12/18 0531  AST 16  ALT 10  ALKPHOS 52  BILITOT 0.7  PROT 6.0*  ALBUMIN 3.1*   No results for input(s): LIPASE, AMYLASE in the last 168 hours. No results for input(s): AMMONIA in the last 168 hours. Coagulation Profile: Recent Labs  Lab 08/11/18 1611  INR 1.10   Cardiac Enzymes: Recent Labs  Lab 08/11/18 2353  TROPONINI 0.05*   BNP (last 3 results) No results for input(s): PROBNP in the last 8760 hours. HbA1C: Recent Labs    08/12/18 0531  HGBA1C 7.5*   CBG: Recent Labs  Lab 08/13/18 1457 08/13/18 1720 08/13/18 2021 08/13/18 2355 08/14/18 0358  GLUCAP 152* 161* 182* 162* 122*   Lipid Profile: No results for input(s): CHOL, HDL, LDLCALC, TRIG, CHOLHDL, LDLDIRECT in the last 72 hours. Thyroid Function Tests: Recent Labs    08/12/18 0531  TSH 2.331   Anemia Panel: No results for input(s): VITAMINB12, FOLATE, FERRITIN, TIBC, IRON, RETICCTPCT in the last 72 hours. Sepsis Labs: No results for input(s): PROCALCITON, LATICACIDVEN in the last 168 hours.  Recent Results (from the past 240 hour(s))  Surgical pcr screen     Status: None   Collection Time: 08/13/18 11:06 AM  Result Value Ref Range Status   MRSA, PCR NEGATIVE NEGATIVE Final   Staphylococcus aureus NEGATIVE NEGATIVE Final    Comment:  (NOTE) The Xpert SA Assay (FDA approved for NASAL specimens in patients 18 years of age and older), is one component of a comprehensive surveillance program. It is not intended to diagnose infection nor to guide or monitor treatment. Performed at [facility], [place] [address], [place], [place] [address]          Radiology Studies: No results found.      Scheduled Meds: . amiodarone  200 mg Oral Daily  . carvedilol  3.125 mg Oral BID WC  . guaiFENesin  600 mg Oral BID  . insulin aspart  0-9 Units Subcutaneous Q4H  . polyethylene glycol  17 g Oral Daily  . predniSONE  5 mg Oral Q breakfast  .  sodium chloride flush  3 mL Intravenous Q12H   Continuous Infusions: . sodium chloride    . sodium chloride    . sodium chloride 10 mL/hr at 08/13/18 1506  .  ceFAZolin (ANCEF) IV 1 g (08/14/18 0532)     LOS: 3 days    Time spent: 35 minutes    Elmarie Shiley, MD [facility] Pager [phone]  If 7PM-7AM, please contact night-coverage [email or web address] Password [facility] 08/14/2018, 7:59 AM

## 2018-08-14 NOTE — Progress Notes (Signed)
ANTICOAGULATION CONSULT NOTE - Initial Consult  Pharmacy Consult for Apixaban Indication: atrial fibrillation  No Known Allergies  Patient Measurements: Height: 6' (182.9 cm) Weight: 214 lb 11.7 oz (97.4 kg) IBW/kg (Calculated) : 77.6  Vital Signs:    Labs: Recent Labs    08/11/18 2353  08/12/18 0531 08/13/18 0309 08/14/18 0516  HGB  --    < > 9.3* 9.1* 8.6*  HCT  --   --  29.7* 28.1* 27.1*  PLT  --   --  195 208 203  CREATININE  --   --  2.71* 2.92* 2.95*  TROPONINI 0.05*  --   --   --   --    < > = values in this interval not displayed.    Estimated Creatinine Clearance: 21.7 mL/min (A) (by C-G formula based on SCr of 2.95 mg/dL (H)).   Medical History: Past Medical History:  Diagnosis Date  . Arthritis    "knees, probably in my back" (08/12/2018)  . Cancer ([place])    lung  . CHF (congestive heart failure) ([place])   . CKD (chronic kidney disease), stage IV ([place])   . Fall 08/11/2018   "going into dr's appointment" (08/12/2018)  . Hyperlipidemia   . Hypertension   . Irregular heart rate    Dr. Victorino December note from 04/2017 states bradycardia, LBBB  . Pneumonia    "probably 3 times in my life; at the most" (08/12/2018)  . Presence of permanent cardiac pacemaker   . Renal insufficiency   . Sarcoidosis, lung ([place])   . Type II diabetes mellitus ([place])     Assessment: 73 YOM who presented on 9/25 s/p mechanical fall s/p wrist/hand repair by Dr. Caralyn Guile on 9/27. The patient was on apixaban PTA for hx Afib - held pre-op and now pharmacy consulted to resume post-op.   Apixaban dose of 2.5 mg bid appropriate with age>80 and SCr>1.5.   Goal of Therapy:  Appropriate anticoagulation for indication and hepatic/renal function    Plan:  - Restart Apixaban 2.5 mg bid  - Pharmacy will sign off of consult but will monitor peripherally for any bleeding or dose adjustment needs  Thank you for allowing pharmacy to be a part of this patient's care.  Alycia Rossetti, PharmD,  BCPS Clinical Pharmacist Pager: [phone] Please check AMION for all [place] numbers 08/14/2018 5:16 PM

## 2018-08-15 ENCOUNTER — Inpatient Hospital Stay (HOSPITAL_COMMUNITY): Payer: PPO

## 2018-08-15 LAB — URINALYSIS, ROUTINE W REFLEX MICROSCOPIC
Bilirubin Urine: NEGATIVE
GLUCOSE, UA: 50 mg/dL — AB
Ketones, ur: 5 mg/dL — AB
Leukocytes, UA: NEGATIVE
Nitrite: NEGATIVE
PROTEIN: 100 mg/dL — AB
SPECIFIC GRAVITY, URINE: 1.011 (ref 1.005–1.030)
pH: 5 (ref 5.0–8.0)

## 2018-08-15 LAB — BASIC METABOLIC PANEL
ANION GAP: 10 (ref 5–15)
BUN: 40 mg/dL — ABNORMAL HIGH (ref 8–23)
CHLORIDE: 98 mmol/L (ref 98–111)
CO2: 27 mmol/L (ref 22–32)
Calcium: 8.2 mg/dL — ABNORMAL LOW (ref 8.9–10.3)
Creatinine, Ser: 3.31 mg/dL — ABNORMAL HIGH (ref 0.61–1.24)
GFR calc non Af Amer: 16 mL/min — ABNORMAL LOW (ref >60)
GFR, EST AFRICAN AMERICAN: 18 mL/min — AB (ref >60)
GLUCOSE: 84 mg/dL (ref 70–99)
POTASSIUM: 3.7 mmol/L (ref 3.5–5.1)
Sodium: 135 mmol/L (ref 135–145)

## 2018-08-15 LAB — IRON AND TIBC
IRON: 23 ug/dL — AB (ref 45–182)
Saturation Ratios: 8 % — ABNORMAL LOW (ref 17.9–39.5)
TIBC: 279 ug/dL (ref 250–450)
UIBC: 256 ug/dL

## 2018-08-15 LAB — CBC
HEMATOCRIT: 25.2 % — AB (ref 39.0–52.0)
HEMOGLOBIN: 8.1 g/dL — AB (ref 13.0–17.0)
MCH: 27.6 pg (ref 26.0–34.0)
MCHC: 32.1 g/dL (ref 30.0–36.0)
MCV: 86 fL (ref 78.0–100.0)
Platelets: 191 10*3/uL (ref 150–400)
RBC: 2.93 MIL/uL — AB (ref 4.22–5.81)
RDW: 13.9 % (ref 11.5–15.5)
WBC: 7.5 10*3/uL (ref 4.0–10.5)

## 2018-08-15 LAB — FERRITIN: FERRITIN: 30 ng/mL (ref 24–336)

## 2018-08-15 LAB — RETICULOCYTES
RBC.: 2.93 MIL/uL — ABNORMAL LOW (ref 4.22–5.81)
RETIC COUNT ABSOLUTE: 52.7 10*3/uL (ref 19.0–186.0)
Retic Ct Pct: 1.8 % (ref 0.4–3.1)

## 2018-08-15 LAB — GLUCOSE, CAPILLARY
GLUCOSE-CAPILLARY: 173 mg/dL — AB (ref 70–99)
GLUCOSE-CAPILLARY: 126 mg/dL — AB (ref 70–99)
GLUCOSE-CAPILLARY: 207 mg/dL — AB (ref 70–99)
GLUCOSE-CAPILLARY: 206 mg/dL — AB (ref 70–99)
Glucose-Capillary: 100 mg/dL — ABNORMAL HIGH (ref 70–99)
Glucose-Capillary: 197 mg/dL — ABNORMAL HIGH (ref 70–99)

## 2018-08-15 LAB — CK: CK TOTAL: 74 U/L (ref 49–397)

## 2018-08-15 LAB — VITAMIN B12: VITAMIN B 12: 174 pg/mL — AB (ref 180–914)

## 2018-08-15 LAB — BRAIN NATRIURETIC PEPTIDE: B NATRIURETIC PEPTIDE 5: 323.5 pg/mL — AB (ref 0.0–100.0)

## 2018-08-15 MED ORDER — CYANOCOBALAMIN 1000 MCG/ML IJ SOLN
1000.0000 ug | Freq: Once | INTRAMUSCULAR | Status: AC
  Administered 2018-08-15: 1000 ug via INTRAMUSCULAR
  Filled 2018-08-15: qty 1

## 2018-08-15 MED ORDER — FERROUS SULFATE 325 (65 FE) MG PO TABS
325.0000 mg | ORAL_TABLET | Freq: Two times a day (BID) | ORAL | Status: DC
  Administered 2018-08-15 – 2018-08-17 (×5): 325 mg via ORAL
  Filled 2018-08-15 (×5): qty 1

## 2018-08-15 MED ORDER — BUDESONIDE 0.25 MG/2ML IN SUSP
0.2500 mg | Freq: Two times a day (BID) | RESPIRATORY_TRACT | Status: DC
  Administered 2018-08-15 – 2018-08-17 (×5): 0.25 mg via RESPIRATORY_TRACT
  Filled 2018-08-15 (×5): qty 2

## 2018-08-15 MED ORDER — IPRATROPIUM-ALBUTEROL 0.5-2.5 (3) MG/3ML IN SOLN
3.0000 mL | Freq: Four times a day (QID) | RESPIRATORY_TRACT | Status: DC
  Administered 2018-08-15 – 2018-08-16 (×6): 3 mL via RESPIRATORY_TRACT
  Filled 2018-08-15 (×6): qty 3

## 2018-08-15 MED ORDER — BISACODYL 10 MG RE SUPP
10.0000 mg | Freq: Once | RECTAL | Status: AC
  Administered 2018-08-15: 10 mg via RECTAL
  Filled 2018-08-15: qty 1

## 2018-08-15 MED ORDER — APIXABAN 2.5 MG PO TABS
2.5000 mg | ORAL_TABLET | Freq: Two times a day (BID) | ORAL | Status: DC
  Administered 2018-08-15 – 2018-08-17 (×4): 2.5 mg via ORAL
  Filled 2018-08-15 (×4): qty 1

## 2018-08-15 NOTE — Progress Notes (Signed)
PROGRESS NOTE    Ruben Conner, DDS  TDV:[id number] DOB: [birth year] DOA: 08/11/2018 PCP: Chipper Herb, MD    Brief Narrative:  84 y.o. male with medical history significant oflong-standing asymptomatic bradycardia, pulmonary and mediastinal sarcoidosis, pulmonary and mediastinal sarcoidosis,type 2 diabetes mellitus, systolic CHF  right upper lobe radiation pneumonitis. CKD stage IV cr at baseline 2-2.5, HTN, LBBB  Admitted for fall resulting in left wrist fracture in association with acute on chronic systolic CHF.    Assessment & Plan:   Active Problems:   Type 2 diabetes mellitus with stage 3 chronic kidney disease, without long-term current use of insulin (HCC)   Hypercholesterolemia   Essential hypertension   Anemia in stage 4 chronic kidney disease (HCC)   Sarcoidosis   CKD (chronic kidney disease) stage 4, GFR 15-29 ml/min (HCC)   Acute systolic heart failure (HCC)   Paroxysmal atrial fibrillation (HCC)   Biventricular automatic implantable cardioverter defibrillator in situ   CHF exacerbation (HCC)   Left wrist fracture, closed, initial encounter   Degloving injury of left hand   Acute on Chronic systolic Heart failure; evaluated by cardiology.  Treated with IV lasix. 60 mg BID> holding lasix today due to increase in creatinine.  Presented with SOB, abdominal distension.  Cardiology following. ECHO EF 30%  Monitor renal function.  Weight 213. ---211( per patient he take torsemide 20 mg when his weight is at 197, if his weight is around 260 he takes 80 mg torsemide). Follow cardio recommendation regarding diuretics.  Urine out put yesterday 910, weight; 217---213.  Complaining of dyspnea, feels congested. Will check BNP and Chest x ray. Will follow cardio rec regarding diuretics.    Sarcoidosis; continue with prednisone.  sarcoidosis. Hilar mass.  Post radiation pneumonitis.  Continue with nebulizer PRN.  Appreciate pre op evaluation from pulmonologist.  On  dextromethorphan.  Bilateral ronchus and wheezing this am. I have schedule nebulizer, Pulmicort, he will get dextromethorphan. Check chest  Xray.   Chronic kidney diseases stage IV;  Cr baseline; 2.1 Avoid nephrotoxin.  Cr increase to 3.3 off diuretics.   Anemia;  Hb baseline 9--10. Hb today 8. 6---from 9 iron and B 12 level  Drop might be post op.  Start B 12 replacement and Iron supplement.   HTN;  Continue with carvedilol, PRNH hydralazine.   Paroxysmal A fib.  Biventricular automatic implantable cardioverter defibrillator in situ  Ok to resume eliquis per ortho. Renal function worse, will hold eliquis. Will discussed with cardio regarding start heparin gtt. Continue with amiodarone and carvedilol.    DM type 2.  SSI.   Left Wrist Fracture;  Clear by cardiology.  Underwent sx 9-27 Pain controlled.   Hypomagnesemia; received IV magnesium.  Repeat level.   Hypokalemia; resolved.   Constipation; no BM yet, after miralax and senna. Will order dulcolax suppository and KUB   DVT prophylaxis: scd Code Status: full code.  Family Communication; no family at bedside.  Disposition Plan:  Remain inpatient for treatment of HR, aki, sx.    Consultants:   Cardiology    Procedures:   none  Antimicrobials:    Subjective: Complaining of SOB, cough, congestion, wheezing.   Objective: Vitals:   08/14/18 0405 08/14/18 2012 08/15/18 0414 08/15/18 0427  BP:  122/64 (!) 149/77   Pulse:  65 67 (!) 59  Resp:  18 20 (!) 41  Temp: 97.6 F (36.4 C) 98.2 F (36.8 C) 98.1 F (36.7 C)   TempSrc:  Oral Oral  SpO2:  94% 95% 92%  Weight: 97.4 kg   98.6 kg  Height:        Intake/Output Summary (Last 24 hours) at 08/15/2018 0825 Last data filed at 08/15/2018 0417 Gross per 24 hour  Intake 509 ml  Output 910 ml  Net -401 ml   [employer]   08/13/18 0344 08/14/18 0405 08/15/18 0427  Weight: 96.1 kg 97.4 kg 98.6 kg    Examination:  General exam: No acute  distress.  Respiratory system: Respiratory effort normal, Bilateral ronchus, wheezing.  Cardiovascular system: S 1, S 2  Gastrointestinal system: BS present, soft, distended Central nervous system: alert, non focal.  Extremities: trace edema. Left arm on splint.  Skin:     Data Reviewed: I have personally reviewed following labs and imaging studies  CBC: Recent Labs  Lab 08/11/18 1611 08/12/18 0531 08/13/18 0309 08/14/18 0516 08/15/18 0250  WBC 8.5 7.8 9.5 9.0 7.5  NEUTROABS 7.1  --   --   --   --   HGB 9.6* 9.3* 9.1* 8.6* 8.1*  HCT 30.8* 29.7* 28.1* 27.1* 25.2*  MCV 88.8 88.7 85.4 86.9 86.0  PLT 245 195 208 203 413   Basic Metabolic Panel: Recent Labs  Lab 08/11/18 1611 08/12/18 0531 08/13/18 0309 08/14/18 0516 08/15/18 0250  NA 140 139 136 135 135  K 4.2 3.9 3.2* 3.8 3.7  CL 104 105 99 99 98  CO2 27 25 27 26 27   GLUCOSE 199* 127* 132* 151* 84  BUN 23 23 28* 36* 40*  CREATININE 2.78* 2.71* 2.92* 2.95* 3.31*  CALCIUM 9.0 8.4* 8.6* 8.2* 8.2*  MG  --  1.5* 2.0  --   --   PHOS  --  3.8  --   --   --    GFR: Estimated Creatinine Clearance: 19.5 mL/min (A) (by C-G formula based on SCr of 3.31 mg/dL (H)). Liver Function Tests: Recent Labs  Lab 08/12/18 0531  AST 16  ALT 10  ALKPHOS 52  BILITOT 0.7  PROT 6.0*  ALBUMIN 3.1*   No results for input(s): LIPASE, AMYLASE in the last 168 hours. No results for input(s): AMMONIA in the last 168 hours. Coagulation Profile: Recent Labs  Lab 08/11/18 1611  INR 1.10   Cardiac Enzymes: Recent Labs  Lab 08/11/18 2353  TROPONINI 0.05*   BNP (last 3 results) No results for input(s): PROBNP in the last 8760 hours. HbA1C: No results for input(s): HGBA1C in the last 72 hours. CBG: Recent Labs  Lab 08/14/18 1624 08/14/18 2009 08/14/18 2357 08/15/18 0412 08/15/18 0747  GLUCAP 133* 255* 100* 100* 126*   Lipid Profile: No results for input(s): CHOL, HDL, LDLCALC, TRIG, CHOLHDL, LDLDIRECT in the last 72  hours. Thyroid Function Tests: No results for input(s): TSH, T4TOTAL, FREET4, T3FREE, THYROIDAB in the last 72 hours. Anemia Panel: Recent Labs    08/15/18 0250  VITAMINB12 174*  FERRITIN 30  TIBC 279  IRON 23*  RETICCTPCT 1.8   Sepsis Labs: No results for input(s): PROCALCITON, LATICACIDVEN in the last 168 hours.  Recent Results (from the past 240 hour(s))  Surgical pcr screen     Status: None   Collection Time: 08/13/18 11:06 AM  Result Value Ref Range Status   MRSA, PCR NEGATIVE NEGATIVE Final   Staphylococcus aureus NEGATIVE NEGATIVE Final    Comment: (NOTE) The Xpert SA Assay (FDA approved for NASAL specimens in patients 40 years of age and older), is one component of a comprehensive surveillance program. It  is not intended to diagnose infection nor to guide or monitor treatment. Performed at [facility], [place] [address] [place], [place] [address]          Radiology Studies: No results found.      Scheduled Meds: . amiodarone  200 mg Oral Daily  . carvedilol  3.125 mg Oral BID WC  . cyanocobalamin  1,000 mcg Intramuscular Once  . ferrous sulfate  325 mg Oral BID WC  . guaiFENesin  600 mg Oral BID  . insulin aspart  0-9 Units Subcutaneous Q4H  . polyethylene glycol  17 g Oral BID  . predniSONE  5 mg Oral Q breakfast  . senna  1 tablet Oral BID  . sodium chloride flush  3 mL Intravenous Q12H   Continuous Infusions: . sodium chloride    . sodium chloride    . sodium chloride 10 mL/hr at 08/13/18 1506     LOS: 4 days    Time spent: 35 minutes    Elmarie Shiley, MD [facility] Pager [phone]  If 7PM-7AM, please contact night-coverage [email or web address] Password [email or web address] 08/15/2018, 8:25 AM

## 2018-08-15 NOTE — Progress Notes (Signed)
[place] for Apixaban Indication: atrial fibrillation  No Known Allergies  Patient Measurements: Height: 6' (182.9 cm) Weight: 217 lb 6 oz (98.6 kg) IBW/kg (Calculated) : 77.6  Vital Signs: Temp: 98 F (36.7 C) (09/29 0900) Temp Source: Oral (09/29 0900) BP: 158/76 (09/29 0900) Pulse Rate: 68 (09/29 0900)  Labs: Recent Labs    08/13/18 0309 08/14/18 0516 08/15/18 0250  HGB 9.1* 8.6* 8.1*  HCT 28.1* 27.1* 25.2*  PLT 208 203 191  CREATININE 2.92* 2.95* 3.31*    Estimated Creatinine Clearance: 19.5 mL/min (A) (by C-G formula based on SCr of 3.31 mg/dL (H)).   Medical History: Past Medical History:  Diagnosis Date  . Arthritis    "knees, probably in my back" (08/12/2018)  . Cancer ([place])    lung  . CHF (congestive heart failure) ([place])   . CKD (chronic kidney disease), stage IV ([place])   . Fall 08/11/2018   "going into dr's appointment" (08/12/2018)  . Hyperlipidemia   . Hypertension   . Irregular heart rate    Dr. Victorino December note from 04/2017 states bradycardia, LBBB  . Pneumonia    "probably 3 times in my life; at the most" (08/12/2018)  . Presence of permanent cardiac pacemaker   . Renal insufficiency   . Sarcoidosis, lung ([place])   . Type II diabetes mellitus ([place])     Assessment: 40 YOM who presented on 9/25 s/p mechanical fall s/p wrist/hand repair by Dr. Caralyn Guile on 9/27. The patient was on apixaban PTA for hx Afib. Pharmacy consulted to restart apixaban today (last dose was 9/29 -SCr trend up (2.9>> 3.3; was 2.7 on admit)   Goal of Therapy:  Appropriate anticoagulation for indication and hepatic/renal function    Plan:  - Restart Apixaban 2.5 mg bid tonight - Pharmacy will sign off of consult but will monitor peripherally for any bleeding or dose adjustment needs  Thank you for allowing pharmacy to be a part of this patient's care.  Hildred Laser, PharmD Clinical Pharmacist Please check Amion for pharmacy contact  number

## 2018-08-15 NOTE — Progress Notes (Signed)
Progress Note  Patient Name: Ruben Conner, DDS Date of Encounter: 08/15/2018  Primary Cardiologist: Sanda Klein, MD   Subjective   Had a nagging cough, improved with guaifenesin.  Eyes orthopnea or PND.  No chest pain. Creatinine has increased further to 3.3 and diuretics are on hold. Telemetry seems to show atrial paced, ventricular paced rhythm most of the time, cannot exclude some episodes of atrial fibrillation with ventricular pacing.  Inpatient Medications    Scheduled Meds: . amiodarone  200 mg Oral Daily  . bisacodyl  10 mg Rectal Once  . budesonide (PULMICORT) nebulizer solution  0.25 mg Nebulization BID  . carvedilol  3.125 mg Oral BID WC  . ferrous sulfate  325 mg Oral BID WC  . guaiFENesin  600 mg Oral BID  . insulin aspart  0-9 Units Subcutaneous Q4H  . ipratropium-albuterol  3 mL Nebulization Q6H  . polyethylene glycol  17 g Oral BID  . predniSONE  5 mg Oral Q breakfast  . senna  1 tablet Oral BID  . sodium chloride flush  3 mL Intravenous Q12H   Continuous Infusions: . sodium chloride    . sodium chloride    . sodium chloride 10 mL/hr at 08/13/18 1506   PRN Meds: sodium chloride, acetaminophen **OR** acetaminophen, dextromethorphan, fentaNYL (SUBLIMAZE) injection, hydrALAZINE, HYDROcodone-acetaminophen, ondansetron **OR** ondansetron (ZOFRAN) IV, sodium chloride flush, zolpidem   Vital Signs    Vitals:   08/15/18 0427 08/15/18 0800 08/15/18 0900 08/15/18 1026  BP:  (!) 158/76 (!) 158/76   Pulse: (!) 59 66 68   Resp: (!) 41 (!) 29 (!) 27   Temp:  98.3 F (36.8 C) 98 F (36.7 C)   TempSrc:  Oral Oral   SpO2: 92% 96% 98% 98%  Weight: 98.6 kg     Height:        Intake/Output Summary (Last 24 hours) at 08/15/2018 1214 Last data filed at 08/15/2018 0962 Gross per 24 hour  Intake 869 ml  Output 1210 ml  Net -341 ml   Filed Weights   08/13/18 0344 08/14/18 0405 08/15/18 0427  Weight: 96.1 kg 97.4 kg 98.6 kg    Telemetry    Mostly a  paced, V paced, cannot exclude some background atrial fibrillation with continued ventricular pacing.- Personally Reviewed  ECG    No new tracing- Personally Reviewed  Physical Exam  Looks comfortable GEN: No acute distress.  Large ecchymosis around left eye Neck: No JVD Cardiac: RRR, no murmurs, rubs, or gallops.  Paradoxically split S2 Respiratory: Clear to auscultation bilaterally. GI: Soft, nontender, non-distended  MS: No edema; No deformity.  Surgical dressing/splint left arm forearm Neuro:  Nonfocal  Psych: Normal affect   Labs    Chemistry Recent Labs  Lab 08/12/18 0531 08/13/18 0309 08/14/18 0516 08/15/18 0250  NA 139 136 135 135  K 3.9 3.2* 3.8 3.7  CL 105 99 99 98  CO2 25 27 26 27   GLUCOSE 127* 132* 151* 84  BUN 23 28* 36* 40*  CREATININE 2.71* 2.92* 2.95* 3.31*  CALCIUM 8.4* 8.6* 8.2* 8.2*  PROT 6.0*  --   --   --   ALBUMIN 3.1*  --   --   --   AST 16  --   --   --   ALT 10  --   --   --   ALKPHOS 52  --   --   --   BILITOT 0.7  --   --   --  GFRNONAA 20* 18* 18* 16*  GFRAA 23* 21* 21* 18*  ANIONGAP 9 10 10 10      Hematology Recent Labs  Lab 08/13/18 0309 08/14/18 0516 08/15/18 0250  WBC 9.5 9.0 7.5  RBC 3.29* 3.12* 2.93*  2.93*  HGB 9.1* 8.6* 8.1*  HCT 28.1* 27.1* 25.2*  MCV 85.4 86.9 86.0  MCH 27.7 27.6 27.6  MCHC 32.4 31.7 32.1  RDW 13.8 14.0 13.9  PLT 208 203 191    Cardiac Enzymes Recent Labs  Lab 08/11/18 2353  TROPONINI 0.05*   No results for input(s): TROPIPOC in the last 168 hours.   BNP Recent Labs  Lab 08/11/18 1611 08/15/18 0847  BNP 369.4* 323.5*     DDimer No results for input(s): DDIMER in the last 168 hours.   Radiology    Dg Abd 1 View  Result Date: 08/15/2018 CLINICAL DATA:  dyspnea EXAM: ABDOMEN - 1 VIEW COMPARISON:  None. FINDINGS: No dilated loops of large or small bowel. Gas and stool in the rectum. No pathologic calcifications. No organomegaly. Degenerative osteophytosis of the spine. IMPRESSION:  No bowel obstruction. Electronically Signed   By: Suzy Bouchard M.D.   On: 08/15/2018 10:24   Dg Chest Port 1 View  Result Date: 08/15/2018 CLINICAL DATA:  dyspnea EXAM: PORTABLE CHEST 1 VIEW COMPARISON:  Radiograph 08/11/2018, CT 07/02/2018 FINDINGS: LEFT-sided pacemaker overlies stable cardiac silhouette. RIGHT perihilar masslike density corresponds to postradiation change on comparison CT. No pulmonary edema. No infiltrate. IMPRESSION: 1. No acute cardiopulmonary findings. 2. Stable postradiation change about the RIGHT hilum. Electronically Signed   By: Suzy Bouchard M.D.   On: 08/15/2018 10:23    Cardiac Studies   Echo 08/13/2018 Study Conclusions  - Left ventricle: The cavity size was normal. Wall thickness was increased in a pattern of moderate LVH. Systolic function was severely reduced. The estimated ejection fraction was in the range of 25% to 30%. Diffuse hypokinesis. Doppler parameters are consistent with abnormal left ventricular relaxation (grade 1 diastolic dysfunction). - Aortic root: The aortic root was mildly dilated.  Impressions:  - Severe global reduction in LV systolic function; moderate LVH; mild diastolic dysfunction; mildly dilated aortic root.  Patient Profile     84 y.o. male with chronic systolic heart failure with severely depressed left ventricular systolic function, CRT-D, paroxysmal atrial fibrillation, sarcoidosis, CKD stage IV, hypertension hypercholesterolemia, admitted after a fall complicated by left wrist fracture  Assessment & Plan    1. CHF: Weight remains above estimated "dry weight", but diuretics held due to worsening renal function.    Also difficult to say how the hospital bed scale compares with his home scale, additional weight from his orthopedic devices.  He does not have evidence of overt left heart failure and can continue to hold diuretics. Etiology remains uncertain (?painless coronary artery disease, cardiac  sarcoidosis, tachycardia-related), but evaluation is challenging due to renal dysfunction, which makes coronary angiography or administration of gadolinium contrast undesirable. 2. AFib:  100% biventricular pacing.  Cannot exclude some periods of atrial fibrillation, but still ventricular paced. He is on chronic amiodarone.  Anticoagulated with Eliquis. 3. CRT-D:  We will ask our EP team to see if we can reprogram his biventricular device tomorrow to avoid occasional uncomfortable diaphragmatic stimulation. 4. Sarcoidosis: On chronic prednisone. 5.  Acute on chronic KD: Creatinine up to 3.3.  Diuretics on hold.  His nephrologist is Dr. Joelyn Oms (in fact his fall happened when he was walking towards Dr. Adin Hector office for an appointment).  For questions or updates, please contact [place] Please consult [email or web address] for contact info under        Signed, Sanda Klein, MD  08/15/2018, 12:14 PM

## 2018-08-15 NOTE — Progress Notes (Signed)
ICE/ELEVATION NWB LUE CONTINUE WITH CURRENT SPLINT F/U IN 2 WEEKS IN OFFICE CONTACT ME IF ANY QUESTIONS [phone]

## 2018-08-15 NOTE — NC FL2 (Signed)
[place] LEVEL OF CARE SCREENING TOOL     IDENTIFICATION  Patient Name: Ruben Conner, Ruben Conner Birthdate: April 24, 1930 Sex: male Admission Date (Current Location): 08/11/2018  [facility] and [place] Number:  Herbalist and Address:  [facility], [place] [address], [place], [place] [address]      Provider Number: [id number]  Attending Physician Name and Address:  Elmarie Shiley, MD  Relative Name and Phone Number:  Marcelles Clinard, [phone]    Current Level of Care: Hospital Recommended Level of Care: [place] Prior Approval Number:    Date Approved/Denied:   PASRR Number: [id number]  Discharge Plan: SNF    Current Diagnoses: Patient Active Problem List   Diagnosis Date Noted  . CHF exacerbation ([place]) 08/11/2018  . Left wrist fracture, closed, initial encounter 08/11/2018  . Degloving injury of left hand 08/11/2018  . Paroxysmal atrial fibrillation ([place]) 08/10/2018  . Cardiomyopathy ([place]) 08/10/2018  . Biventricular automatic implantable cardioverter defibrillator in situ 08/10/2018  . Chronic combined systolic and diastolic heart failure ([place]) 05/31/2018  . Acute systolic heart failure ([place]) 05/24/2018  . Persistent atrial fibrillation ([place]) 05/24/2018  . Sarcoidosis 04/06/2018  . CKD (chronic kidney disease) stage 4, GFR 15-29 ml/min (HCC) 04/06/2018  . Edema 02/23/2018  . Mediastinal lymphadenopathy 03/10/2017  . Anemia in stage 4 chronic kidney disease ([place]) 09/18/2016  . Vitamin D deficiency 09/18/2016  . Sinus bradycardia 08/31/2014  . First degree AV block 08/31/2014  . LBBB (left bundle branch block) 08/31/2014  . Essential hypertension 09/20/2013  . Type 2 diabetes mellitus with stage 3 chronic kidney disease, without long-term current use of insulin ([place]) 05/02/2013  . Hypercholesterolemia 05/02/2013    Orientation RESPIRATION BLADDER Height & Weight     Self, Time, Situation, Place  O2(2L)  External catheter, Continent Weight: 217 lb 6 oz (98.6 kg) Height:  6' (182.9 cm)  BEHAVIORAL SYMPTOMS/MOOD NEUROLOGICAL BOWEL NUTRITION STATUS      Continent Diet(see dc summary)  AMBULATORY STATUS COMMUNICATION OF NEEDS Skin   Limited Assist Verbally                         Personal Care Assistance Level of Assistance  Bathing, Feeding, Dressing Bathing Assistance: Limited assistance Feeding assistance: Independent Dressing Assistance: Limited assistance     Functional Limitations Info  Sight, Hearing, Speech Sight Info: Adequate Hearing Info: Adequate Speech Info: Adequate    SPECIAL CARE FACTORS FREQUENCY  PT (By licensed PT), OT (By licensed OT)     PT Frequency: 5x wk OT Frequency: 5x wk            Contractures Contractures Info: Not present    Additional Factors Info  Code Status, Allergies Code Status Info: Full Code Allergies Info: DNR           Current Medications (08/15/2018):  This is the current hospital active medication list Current Facility-Administered Medications  Medication Dose Route Frequency Provider Last Rate Last Dose  . 0.9 %  sodium chloride infusion  250 mL Intravenous PRN Iran Planas, MD      . 0.9 %  sodium chloride infusion   Intravenous Continuous Iran Planas, MD      . 0.9 %  sodium chloride infusion   Intravenous Continuous Iran Planas, MD 10 mL/hr at 08/13/18 1506    . acetaminophen (TYLENOL) tablet 650 mg  650 mg Oral Q6H PRN Iran Planas, MD  Or  . acetaminophen (TYLENOL) suppository 650 mg  650 mg Rectal Q6H PRN Iran Planas, MD      . amiodarone (PACERONE) tablet 200 mg  200 mg Oral Daily Iran Planas, MD   200 mg at 08/15/18 0848  . bisacodyl (DULCOLAX) suppository 10 mg  10 mg Rectal Once Regalado, Belkys A, MD      . budesonide (PULMICORT) nebulizer solution 0.25 mg  0.25 mg Nebulization BID Regalado, Belkys A, MD   0.25 mg at 08/15/18 1024  . carvedilol (COREG) tablet 3.125 mg  3.125 mg Oral BID WC  Iran Planas, MD   3.125 mg at 08/15/18 0849  . dextromethorphan (DELSYM) 30 MG/5ML liquid 15 mg  15 mg Oral BID PRN Iran Planas, MD   15 mg at 08/15/18 0849  . fentaNYL (SUBLIMAZE) injection 50 mcg  50 mcg Intravenous Q2H PRN Iran Planas, MD      . ferrous sulfate tablet 325 mg  325 mg Oral BID WC Regalado, Belkys A, MD   325 mg at 08/15/18 0849  . guaiFENesin (MUCINEX) 12 hr tablet 600 mg  600 mg Oral BID Iran Planas, MD   600 mg at 08/15/18 0849  . hydrALAZINE (APRESOLINE) injection 10 mg  10 mg Intravenous Q8H PRN Iran Planas, MD      . HYDROcodone-acetaminophen (NORCO/VICODIN) 5-325 MG per tablet 1-2 tablet  1-2 tablet Oral Q4H PRN Iran Planas, MD   2 tablet at 08/14/18 2359  . insulin aspart (novoLOG) injection 0-9 Units  0-9 Units Subcutaneous Q4H Iran Planas, MD   1 Units at 08/15/18 0848  . ipratropium-albuterol (DUONEB) 0.5-2.5 (3) MG/3ML nebulizer solution 3 mL  3 mL Nebulization Q6H Regalado, Belkys A, MD   3 mL at 08/15/18 1024  . ondansetron (ZOFRAN) tablet 4 mg  4 mg Oral Q6H PRN Iran Planas, MD       Or  . ondansetron [facility]) injection 4 mg  4 mg Intravenous Q6H PRN Iran Planas, MD      . polyethylene glycol (MIRALAX / GLYCOLAX) packet 17 g  17 g Oral BID Regalado, Belkys A, MD   17 g at 08/15/18 0847  . predniSONE (DELTASONE) tablet 5 mg  5 mg Oral Q breakfast Iran Planas, MD   5 mg at 08/15/18 0849  . senna (SENOKOT) tablet 8.6 mg  1 tablet Oral BID Regalado, Belkys A, MD   8.6 mg at 08/15/18 0849  . sodium chloride flush (NS) 0.9 % injection 3 mL  3 mL Intravenous Q12H Iran Planas, MD   3 mL at 08/15/18 0850  . sodium chloride flush (NS) 0.9 % injection 3 mL  3 mL Intravenous PRN Iran Planas, MD      . zolpidem Lorrin Mais) tablet 5 mg  5 mg Oral QHS PRN Iran Planas, MD   5 mg at 08/12/18 2220     Discharge Medications: Please see discharge summary for a list of discharge medications.  Relevant Imaging Results:  Relevant Lab Results:   Additional  Information SS# [id number]  Wende Neighbors, LCSW

## 2018-08-15 NOTE — Clinical Social Work Note (Signed)
Clinical Social Work Assessment  Patient Details  Name: Ruben Conner, DDS MRN: [id number] Date of Birth: November 01, 1930  Date of referral:  08/15/18               Reason for consult:  Facility Placement, Discharge Planning                Permission sought to share information with:  Family Supports Permission granted to share information::  Yes, Verbal Permission Granted  Name::     Ruben Conner   Agency::  snf  Relationship::  son  Contact Information:  [phone]  Housing/Transportation Living arrangements for the past 2 months:  [place] of Information:  Patient Patient Interpreter Needed:  None Criminal Activity/Legal Involvement Pertinent to Current Situation/Hospitalization:  No - Comment as needed Significant Relationships:  Other Family Members, Spouse, Adult Children Lives with:  Spouse Do you feel safe going back to the place where you live?  Yes Need for family participation in patient care:  Yes (Comment)  Care giving concerns:  No family at bedside. Patient stated he lives at home with wife and he is the caregiver for his wife. Patient stated he is aware that he needs to have rehab but stated he is unable to leave his wife alone by herself. Patient stated his son and daughter in law are taking care of his spouse now but patient stated that son and daughter in law have other family obligations. Patient stated family wont be able to care of spouse while patient is receiving therapy.    Social Worker assessment / plan:  CSW metr patient at bedside to discuss discharge plan. patient stated he understand the importance of receiving rehab after surgery. Patient is concern about his wife's care if he goes to a SNF facility. CSW encouraged patient to discuss the care of his wife with his family and to make plans until patient is able to get back to baseline. Patient stated wife may be able to stay with her sister. Patient stated he will reach out to son and discuss  plans with him. Patient gave CSW verbal permission to fax him out to SNF in the area and stated he will make decision once he gets wife situated. CSW to follow up with patient once bed offers are available.   Employment status:  Retired Nurse, adult PT Recommendations:  [place] / Referral to community resources:  [place]  Patient/Family's Response to care:  Patient understand the importance of rehab but is concerns about his wife's wellbeing   Patient/Family's Understanding of and Emotional Response to Diagnosis, Current Treatment, and Prognosis:  Patient will discuss discharge plans with family  Emotional Assessment Appearance:  Appears stated age Attitude/Demeanor/Rapport:  Engaged Affect (typically observed):  Accepting, Pleasant Orientation:  Oriented to Self, Oriented to Place, Oriented to  Time, Oriented to Situation Alcohol / Substance use:  Not Applicable Psych involvement (Current and /or in the community):  No (Comment)  Discharge Needs  Concerns to be addressed:  Care Coordination Readmission within the last 30 days:  No Current discharge risk:  Dependent with Mobility Barriers to Discharge:  Continued Medical Work up   [employer], LCSW 08/15/2018, 10:09 AM

## 2018-08-16 ENCOUNTER — Encounter (HOSPITAL_COMMUNITY): Payer: Self-pay | Admitting: Orthopedic Surgery

## 2018-08-16 DIAGNOSIS — N179 Acute kidney failure, unspecified: Secondary | ICD-10-CM

## 2018-08-16 LAB — BASIC METABOLIC PANEL
Anion gap: 8 (ref 5–15)
BUN: 38 mg/dL — ABNORMAL HIGH (ref 8–23)
CHLORIDE: 101 mmol/L (ref 98–111)
CO2: 25 mmol/L (ref 22–32)
Calcium: 8.1 mg/dL — ABNORMAL LOW (ref 8.9–10.3)
Creatinine, Ser: 2.84 mg/dL — ABNORMAL HIGH (ref 0.61–1.24)
GFR, EST AFRICAN AMERICAN: 22 mL/min — AB (ref >60)
GFR, EST NON AFRICAN AMERICAN: 19 mL/min — AB (ref >60)
Glucose, Bld: 109 mg/dL — ABNORMAL HIGH (ref 70–99)
POTASSIUM: 3.7 mmol/L (ref 3.5–5.1)
Sodium: 134 mmol/L — ABNORMAL LOW (ref 135–145)

## 2018-08-16 LAB — GLUCOSE, CAPILLARY
GLUCOSE-CAPILLARY: 125 mg/dL — AB (ref 70–99)
GLUCOSE-CAPILLARY: 194 mg/dL — AB (ref 70–99)
GLUCOSE-CAPILLARY: 231 mg/dL — AB (ref 70–99)
GLUCOSE-CAPILLARY: 89 mg/dL (ref 70–99)
Glucose-Capillary: 143 mg/dL — ABNORMAL HIGH (ref 70–99)
Glucose-Capillary: 221 mg/dL — ABNORMAL HIGH (ref 70–99)

## 2018-08-16 LAB — HEMOGLOBIN AND HEMATOCRIT, BLOOD
HCT: 26.9 % — ABNORMAL LOW (ref 39.0–52.0)
Hemoglobin: 8.6 g/dL — ABNORMAL LOW (ref 13.0–17.0)

## 2018-08-16 MED ORDER — INSULIN ASPART 100 UNIT/ML ~~LOC~~ SOLN
0.0000 [IU] | Freq: Three times a day (TID) | SUBCUTANEOUS | Status: DC
  Administered 2018-08-17: 2 [IU] via SUBCUTANEOUS

## 2018-08-16 MED ORDER — MILK AND MOLASSES ENEMA
1.0000 | Freq: Once | RECTAL | Status: AC
  Administered 2018-08-17: 250 mL via RECTAL
  Filled 2018-08-16 (×2): qty 250

## 2018-08-16 MED ORDER — CYANOCOBALAMIN 1000 MCG/ML IJ SOLN
1000.0000 ug | Freq: Once | INTRAMUSCULAR | Status: AC
  Administered 2018-08-16: 1000 ug via INTRAMUSCULAR
  Filled 2018-08-16: qty 1

## 2018-08-16 MED ORDER — IPRATROPIUM-ALBUTEROL 0.5-2.5 (3) MG/3ML IN SOLN: 3.0000 mL | Freq: Four times a day (QID) | RESPIRATORY_TRACT | Status: DC | PRN

## 2018-08-16 NOTE — Progress Notes (Signed)
Progress Note  Patient Name: Ruben Conner, DDS Date of Encounter: 08/16/2018  Primary Cardiologist: Sanda Klein, MD   Subjective   No complaints. In regard to pacer he occasionally gets sharp shooting pain to right shoulder And back but he says its not that bad Urinating well   Inpatient Medications    Scheduled Meds: . amiodarone  200 mg Oral Daily  . apixaban  2.5 mg Oral BID  . budesonide (PULMICORT) nebulizer solution  0.25 mg Nebulization BID  . carvedilol  3.125 mg Oral BID WC  . ferrous sulfate  325 mg Oral BID WC  . guaiFENesin  600 mg Oral BID  . insulin aspart  0-9 Units Subcutaneous Q4H  . ipratropium-albuterol  3 mL Nebulization Q6H  . polyethylene glycol  17 g Oral BID  . predniSONE  5 mg Oral Q breakfast  . senna  1 tablet Oral BID  . sodium chloride flush  3 mL Intravenous Q12H   Continuous Infusions: . sodium chloride    . sodium chloride    . sodium chloride 10 mL/hr at 08/13/18 1506   PRN Meds: sodium chloride, acetaminophen **OR** acetaminophen, dextromethorphan, fentaNYL (SUBLIMAZE) injection, hydrALAZINE, HYDROcodone-acetaminophen, ondansetron **OR** ondansetron (ZOFRAN) IV, sodium chloride flush, zolpidem   Vital Signs    Vitals:   08/16/18 0346 08/16/18 0441 08/16/18 0456 08/16/18 0828  BP: (!) 166/70 (!) 143/61  (!) 146/67  Pulse: 68 67 67 74  Resp:   14   Temp: 98.3 F (36.8 C)     TempSrc: Oral     SpO2:  97% 91%   Weight:   100 kg   Height:        Intake/Output Summary (Last 24 hours) at 08/16/2018 0921 Last data filed at 08/16/2018 0130 Gross per 24 hour  Intake 600 ml  Output 650 ml  Net -50 ml   Filed Weights   08/14/18 0405 08/15/18 0427 08/16/18 0456  Weight: 97.4 kg 98.6 kg 100 kg    Telemetry    V pacing   ECG    No new tracing- Personally Reviewed  Physical Exam  BP (!) 146/67   Pulse 74   Temp 98.3 F (36.8 C) (Oral)   Resp 14   Ht 6' (1.829 m)   Wt 100 kg   SpO2 91%   BMI 29.90 kg/m  Affect  appropriate Elderly white male  HEENT: laceration over left eye  Neck supple with no adenopathy JVP normal no bruits no thyromegaly Lungs clear with no wheezing and good diaphragmatic motion Heart:  S1/S2 no murmur, no rub, gallop or click PMI normal AICD under left clavicle  Abdomen: benighn, BS positve, no tenderness, no AAA no bruit.  No HSM or HJR Distal pulses intact with no bruits No edema Neuro non-focal Skin warm and dry No muscular weakness   Labs    Chemistry Recent Labs  Lab 08/12/18 0531  08/14/18 0516 08/15/18 0250 08/16/18 0303  NA 139   < > 135 135 134*  K 3.9   < > 3.8 3.7 3.7  CL 105   < > 99 98 101  CO2 25   < > 26 27 25   GLUCOSE 127*   < > 151* 84 109*  BUN 23   < > 36* 40* 38*  CREATININE 2.71*   < > 2.95* 3.31* 2.84*  CALCIUM 8.4*   < > 8.2* 8.2* 8.1*  PROT 6.0*  --   --   --   --  ALBUMIN 3.1*  --   --   --   --   AST 16  --   --   --   --   ALT 10  --   --   --   --   ALKPHOS 52  --   --   --   --   BILITOT 0.7  --   --   --   --   GFRNONAA 20*   < > 18* 16* 19*  GFRAA 23*   < > 21* 18* 22*  ANIONGAP 9   < > 10 10 8    < > = values in this interval not displayed.     Hematology Recent Labs  Lab 08/13/18 0309 08/14/18 0516 08/15/18 0250  WBC 9.5 9.0 7.5  RBC 3.29* 3.12* 2.93*  2.93*  HGB 9.1* 8.6* 8.1*  HCT 28.1* 27.1* 25.2*  MCV 85.4 86.9 86.0  MCH 27.7 27.6 27.6  MCHC 32.4 31.7 32.1  RDW 13.8 14.0 13.9  PLT 208 203 191    Cardiac Enzymes Recent Labs  Lab 08/11/18 2353  TROPONINI 0.05*   No results for input(s): TROPIPOC in the last 168 hours.   BNP Recent Labs  Lab 08/11/18 1611 08/15/18 0847  BNP 369.4* 323.5*     DDimer No results for input(s): DDIMER in the last 168 hours.   Radiology    Dg Abd 1 View  Result Date: 08/15/2018 CLINICAL DATA:  dyspnea EXAM: ABDOMEN - 1 VIEW COMPARISON:  None. FINDINGS: No dilated loops of large or small bowel. Gas and stool in the rectum. No pathologic calcifications. No  organomegaly. Degenerative osteophytosis of the spine. IMPRESSION: No bowel obstruction. Electronically Signed   By: Suzy Bouchard M.D.   On: 08/15/2018 10:24   US Renal  Result Date: 08/15/2018 CLINICAL DATA:  Acute kidney injury. EXAM: RENAL / URINARY TRACT ULTRASOUND COMPLETE COMPARISON:  None. FINDINGS: Right Kidney: Length: 12.6 cm. Cortical thinning is noted. Echogenicity within normal limits. No mass or hydronephrosis visualized. Left Kidney: Length: 12.4 cm. Cortical thinning is noted. Echogenicity within normal limits. No mass or hydronephrosis visualized. Bladder: Appears normal for degree of bladder distention. Ureteral jets are not visualized, but exam is somewhat limited due to body habitus. IMPRESSION: Mild bilateral renal cortical thinning is noted. No hydronephrosis or renal obstruction is noted. Electronically Signed   By: [name], M.D.   On: 08/15/2018 20:21   Dg Chest Port 1 View  Result Date: 08/15/2018 CLINICAL DATA:  dyspnea EXAM: PORTABLE CHEST 1 VIEW COMPARISON:  Radiograph 08/11/2018, CT 07/02/2018 FINDINGS: LEFT-sided pacemaker overlies stable cardiac silhouette. RIGHT perihilar masslike density corresponds to postradiation change on comparison CT. No pulmonary edema. No infiltrate. IMPRESSION: 1. No acute cardiopulmonary findings. 2. Stable postradiation change about the RIGHT hilum. Electronically Signed   By: Suzy Bouchard M.D.   On: 08/15/2018 10:23    Cardiac Studies   Echo 08/13/2018 Study Conclusions  - Left ventricle: The cavity size was normal. Wall thickness was increased in a pattern of moderate LVH. Systolic function was severely reduced. The estimated ejection fraction was in the range of 25% to 30%. Diffuse hypokinesis. Doppler parameters are consistent with abnormal left ventricular relaxation (grade 1 diastolic dysfunction). - Aortic root: The aortic root was mildly dilated.  Impressions:  - Severe global reduction in LV  systolic function; moderate LVH; mild diastolic dysfunction; mildly dilated aortic root.  Patient Profile     84 y.o. male with chronic systolic heart failure  with severely depressed left ventricular systolic function, CRT-D, paroxysmal atrial fibrillation, sarcoidosis, CKD stage IV, hypertension hypercholesterolemia, admitted after a fall complicated by left wrist fracture  Assessment & Plan    1. CHF: improved diuretic held due to elevated Cr  2. AFib:  100% biventricular pacing.  Cannot exclude some periods of atrial fibrillation, but still ventricular paced. He is on chronic amiodarone.  Anticoagulated with Eliquis. 3. CRT-D:  Left message for EP to see regarding diaphragmatic pacing although patient does not seem too bothered by it 4. Sarcoidosis: On chronic prednisone. 5.  Acute on chronic KD: Creatinine up to 2.8 Diuretics on hold.  His nephrologist is Dr. Joelyn Oms (in fact his fall happened when he was walking towards Dr. Adin Hector office for an appointment).  For questions or updates, please contact [place] Please consult [email or web address] for contact info under        Signed, Jenkins Rouge, MD  08/16/2018, 9:21 AM

## 2018-08-16 NOTE — Progress Notes (Addendum)
Device interrogation reviewed from today Battery and lead measurements are stable 98% BiVe pacing AF burden is <1% Patient reported an occasional "thumping" when he lays on L side.  Given his L arm injury, unable to lay on his left to evaluate. While supine here, he  has diaphragmatic stim and high thresholds with other poles. Device left at original setting/and vector with no active diaphragmatic stimulation. He is s/p fall, lead positioning all appear stable on CXR  Pt tells me he had only felt the thumping once or twice while here, had not noted it previously, or again since Left shoulder pain he reports is chronic, though worse after fall Pacer site is well healed. No reprogramming was done today  We can follow up in office.  He has EP f/u 09/08/18  Tommye Standard, PA-C  EP Attending Agree with above.   Mikle Bosworth.D.

## 2018-08-16 NOTE — Progress Notes (Signed)
Physical Therapy Treatment Patient Details Name: LATOYA DISKIN, DDS MRN: [id number] DOB: [birth year] Today's Date: 08/16/2018    History of Present Illness Mr. Nelles is a 84 year old male with chronic systolic heart failure, dual-chamber CRT-P device, [employer] Jude, Dr. Lovena Le, with EF of 35%, 98% biventricular pacing with no atrial fibrillation who previously received primary bronchogenic carcinoma therapy central right upper lobe with radiation, chronic kidney disease 2.7 creatinine, dementia of his wife, [name], who unfortunately suffered a fall and injured hand, wrist significantly requiring surgical repair by Dr. Apolonio Schneiders.  Pt reports his knees gave out on him causing him to fall. ORIF left wrist 9/27.      PT Comments    Pt admitted with above diagnosis. Pt currently with functional limitations due to balance and endurance deficits. Pt was able to ambulate with hemiwalker with min assist and chair follow.  Pt needed mod cues for technique.  Pt progressing. Still needs SNF as he cannot perfom ADLs with one extremity nor can he ambulate without 1 person min assist currently.   Pt will benefit from skilled PT to increase their independence and safety with mobility to allow discharge to the venue listed below.    Follow Up Recommendations  SNF;Supervision/Assistance - 24 hour(wants to go home and hire caregiver, wife disabled too)     Equipment Recommendations  (TBA)    Recommendations for Other Services       Precautions / Restrictions Precautions Precautions: Fall Required Braces or Orthoses: Sling Restrictions Weight Bearing Restrictions: Yes LUE Weight Bearing: Non weight bearing    Mobility  Bed Mobility Overal bed mobility: Needs Assistance Bed Mobility: Rolling;Sidelying to Sit Rolling: Mod assist Sidelying to sit: Mod assist Supine to sit: Mod assist     General bed mobility comments: Pt needed mod assist to come to eOB with pt pulling up on PT.  He could not push up  enough on elbows to come all the way to sitting without assist.  Transfers Overall transfer level: Needs assistance Equipment used: Hemi-[place] Transfers: Sit to/from [employer] Transfers Sit to Stand: +2 safety/equipment;Min assist         General transfer comment: Pt needed min assist to power up and steady with less posterior lean.  Better postural stability in standing using hemiwalker.   Ambulation/Gait Ambulation/Gait assistance: Min assist;+2 safety/equipment Gait Distance (Feet): 125 Feet Assistive device: Hemi-Alberg Gait Pattern/deviations: Step-to pattern;Decreased step length - right;Decreased step length - left;Decreased stride length;Shuffle;Decreased weight shift to left;Antalgic   Gait velocity interpretation: <1.31 ft/sec, indicative of household ambulator General Gait Details: Pt still takes small steps at times, needed cues for sequencing steps and hemiwalker.  Initially getting hemiwalker caughter on objects but got better the longer he walked.  Pt was much steadier with the hemiwalker but needs more practice with technique and pposture with challenges.     Stairs             Wheelchair Mobility    Modified Rankin (Stroke Patients Only)       Balance Overall balance assessment: Needs assistance Sitting-balance support: No upper extremity supported;Feet supported Sitting balance-Leahy Scale: Fair     Standing balance support: Single extremity supported;During functional activity Standing balance-Leahy Scale: Poor Standing balance comment: relies on UE and external support for balance.                             Cognition Arousal/Alertness: Awake/alert Behavior During Therapy: Flat affect Overall Cognitive  Status: Within Functional Limits for tasks assessed                                        Exercises General Exercises - Lower Extremity Ankle Circles/Pumps: AROM;Both;10 reps;Supine Long Arc Quad:  AROM;Both;10 reps;Seated    General Comments General comments (skin integrity, edema, etc.): Sling in place on left UE and pillow placed under elbow      Pertinent Vitals/Pain Pain Assessment: Faces Faces Pain Scale: Hurts little more Pain Location: left wrist Pain Descriptors / Indicators: Aching;Grimacing;Guarding Pain Intervention(s): Limited activity within patient's tolerance;Monitored during session;Premedicated before session;Repositioned    Home Living                      Prior Function            PT Goals (current goals can now be found in the care plan section) Acute Rehab PT Goals Patient Stated Goal: to go home and be able to get around Progress towards PT goals: Progressing toward goals    Frequency    Min 3X/week      PT Plan Current plan remains appropriate    Co-evaluation              AM-PAC PT "6 Clicks" Daily Activity  Outcome Measure  Difficulty turning over in bed (including adjusting bedclothes, sheets and blankets)?: Unable Difficulty moving from lying on back to sitting on the side of the bed? : Unable Difficulty sitting down on and standing up from a chair with arms (e.g., wheelchair, bedside commode, etc,.)?: A Lot Help needed moving to and from a bed to chair (including a wheelchair)?: A Lot Help needed walking in hospital room?: A Little Help needed climbing 3-5 steps with a railing? : A Lot 6 Click Score: 11    End of Session Equipment Utilized During Treatment: Gait belt Activity Tolerance: Patient limited by fatigue;Patient limited by pain Patient left: in chair;with call bell/phone within reach;with chair alarm set Nurse Communication: Mobility status PT Visit Diagnosis: Unsteadiness on feet (R26.81);Muscle weakness (generalized) (M62.81);Pain;History of falling (Z91.81) Pain - Right/Left: Left Pain - part of body: (wrist)     Time: 4967-5916 PT Time Calculation (min) (ACUTE ONLY): 25 min  Charges:  $Gait  Training: 23-37 mins                     [place] Pager:  [phone]  Office:  [place] 08/16/2018, 1:29 PM

## 2018-08-16 NOTE — Progress Notes (Signed)
Subjective: 3 Days Post-Op Procedure(s) (LRB): OPEN REDUCTION INTERNAL FIXATION (ORIF) WRIST FRACTURE (Left) Patient reports pain as 1 on 0-10 scale.   The patient is doing very well and states that his pain is well-controlled.  He denies numbness or tingling in the hand.    Objective: Vital signs in last 24 hours: Temp:  [98.2 F (36.8 C)-98.6 F (37 C)] 98.3 F (36.8 C) (09/30 0346) Pulse Rate:  [61-74] 73 (09/30 0926) Resp:  [13-29] 20 (09/30 0926) BP: (131-166)/(61-121) 146/67 (09/30 0828) SpO2:  [91 %-97 %] 93 % (09/30 0930) Weight:  [100 kg] 100 kg (09/30 0456)  Intake/Output from previous day: 09/29 0701 - 09/30 0700 In: 960 [P.O.:960] Out: 950 [Urine:950] Intake/Output this shift: No intake/output data recorded.  Recent Labs    08/14/18 0516 08/15/18 0250  HGB 8.6* 8.1*   Recent Labs    08/14/18 0516 08/15/18 0250  WBC 9.0 7.5  RBC 3.12* 2.93*  2.93*  HCT 27.1* 25.2*  PLT 203 191   Recent Labs    08/15/18 0250 08/16/18 0303  NA 135 134*  K 3.7 3.7  CL 98 101  CO2 27 25  BUN 40* 38*  CREATININE 3.31* 2.84*  GLUCOSE 84 109*  CALCIUM 8.2* 8.1*   No results for input(s): LABPT, INR in the last 72 hours.   Neurovascular intact Sensation intact distally Incision: dressing C/D/I Able to wiggle all digits without difficulty.   Assessment/Plan: 3 Days Post-Op Procedure(s) (LRB): OPEN REDUCTION INTERNAL FIXATION (ORIF) WRIST FRACTURE (Left) Continue with sugar tong splint keeping it clean and dry. NWB to the LUE. Ice and elevation to the LUE. Pain management as needed.  Follow up in the office 2 weeks post-operative. He can call our office to schedule the appointment at [phone].      Brynda Peon 08/16/2018, 11:06 AM

## 2018-08-16 NOTE — Progress Notes (Signed)
Occupational Therapy Treatment Patient Details Name: Ruben Conner, DDS MRN: [id number] DOB: 1929/12/31 Today's Date: 08/16/2018    History of present illness Ruben Conner is a 84 year old male with chronic systolic heart failure, dual-chamber CRT-P device, [employer] Jude, Dr. Lovena Le, with EF of 35%, 98% biventricular pacing with no atrial fibrillation who previously received primary bronchogenic carcinoma therapy central right upper lobe with radiation, chronic kidney disease 2.7 creatinine, dementia of his wife, Ruben Conner, who unfortunately suffered a fall and injured hand, wrist significantly requiring surgical repair by Dr. Apolonio Schneiders.  Pt reports his knees gave out on him causing him to fall.    OT comments  Pt is very eager to regain his independence and very motivated to work with therapies.  He is able to now perform ADLs with min - mod A.   Long discussion with him re: amount and type of assistance he provides to wife (he assists her with ambulation and all ADLs), and his limitations with his ability to provide this level of care to her at this time.   He is very aware of his limitations, but reports that they have limited to  No options for assistance both financially and physically at this time.  He states he would be willing to go to SNF if his wife can go with him.   Continue to feel he is at risk for falls, and even if he is able to progress to a level of managing himself, do not anticipate he will progress adequately to a level he can safely assist wife.  Feel SNF is safest and best option for both of them at this time, however, will continue to work with pt to increase his independence with ADLs and problem solve through potential modifications they can do to maximize their safety should he choose to return home.   IF he chooses to discharge home, would maximize services in the home - [place], PT, SW, RN, aide.   Follow Up Recommendations  SNF;Supervision/Assistance - 24 hour  - If pt refuses SNF  recommend maximizing HH services HHOT, PT, SW, RN, aide.   Equipment Recommendations  None recommended by OT    Recommendations for Other Services      Precautions / Restrictions Precautions Precautions: Fall Required Braces or Orthoses: Sling Restrictions Weight Bearing Restrictions: Yes LUE Weight Bearing: Non weight bearing Other Position/Activity Restrictions: NWB L wrist       Mobility Bed Mobility Overal bed mobility: Needs Assistance Bed Mobility: Supine to Sit;Sit to Supine Rolling: Mod assist Sidelying to sit: Min assist;HOB elevated Supine to sit: Min guard     General bed mobility comments: Discussed sleeping in a recliner, which pt reports he is able to do at discharge, if he discharges home.  He requires assist to lift trunk from bed   Transfers Overall transfer level: Needs assistance Equipment used: Hemi-Linker Transfers: Sit to/from [employer] Sit to Stand: Min guard Stand pivot transfers: Min assist       General transfer comment: assist for balance and safety     Balance Overall balance assessment: Needs assistance Sitting-balance support: Feet supported Sitting balance-Leahy Scale: Good Sitting balance - Comments: able to don/doff socks without LOB    Standing balance support: Single extremity supported Standing balance-Leahy Scale: Poor Standing balance comment: reliant on UE support                            ADL either performed or assessed  with clinical judgement   ADL Overall ADL's : Needs assistance/impaired Eating/Feeding: Set up;Bed level;Sitting   Grooming: Wash/dry hands;Wash/dry face;Oral care;Brushing hair;Minimal assistance;Standing       Lower Body Bathing: Moderate assistance;Sit to/from stand       Lower Body Dressing: Minimal assistance;Sit to/from stand Lower Body Dressing Details (indicate cue type and reason): able to don/doff socks with min guard assist sitting EOB  Toilet Transfer:  Minimal assistance;Ambulation;Comfort height toilet;Grab bars(hemi Leifheit )   Toileting- Clothing Manipulation and Hygiene: Moderate assistance;Sit to/from stand       Functional mobility during ADLs: Minimal assistance(hemi Thomure ) General ADL Comments: long discussion with pt re: his current limitations and the amount and type of assist he provides to his wife.  He is able to fully acknowledge his limitations and inability to safely assist her, but they have very limited to no resources finanacially or physically to assist at this time.  He is very concerned about how they will manage.        Vision       Perception     Praxis      Cognition Arousal/Alertness: Awake/alert Behavior During Therapy: WFL for tasks assessed/performed Overall Cognitive Status: Within Functional Limits for tasks assessed                                          Exercises Exercises: Other exercises General Exercises - Lower Extremity Ankle Circles/Pumps: AROM;Both;10 reps;Supine Long Arc Quad: AROM;Both;10 reps;Seated Other Exercises Other Exercises: Pt instructed to perform AROM of fingers flex/ext 10x/hour.  Performed AAROM Lt shoulder flex and abduction x 10 reps    Shoulder Instructions       General Comments Sling in place on left UE and pillow placed under elbow    Pertinent Vitals/ Pain       Pain Assessment: No/denies pain Faces Pain Scale: Hurts little more Pain Location: left wrist Pain Descriptors / Indicators: Aching;Grimacing;Guarding Pain Intervention(s): Limited activity within patient's tolerance;Monitored during session;Premedicated before session;Repositioned  Home Living                                          Prior Functioning/Environment              Frequency  Min 2X/week        Progress Toward Goals  OT Goals(current goals can now be found in the care plan section)  Progress towards OT goals: Progressing toward  goals  Acute Rehab OT Goals Patient Stated Goal: to go home and be able to get around  Plan Discharge plan remains appropriate    Co-evaluation                 AM-PAC PT "6 Clicks" Daily Activity     Outcome Measure   Help from another person eating meals?: A Little Help from another person taking care of personal grooming?: A Little Help from another person toileting, which includes using toliet, bedpan, or urinal?: A Lot Help from another person bathing (including washing, rinsing, drying)?: A Lot Help from another person to put on and taking off regular upper body clothing?: A Lot Help from another person to put on and taking off regular lower body clothing?: A Little 6 Click Score: 15    End of Session  Equipment Utilized During Treatment: Gait belt  OT Visit Diagnosis: Unsteadiness on feet (R26.81);Muscle weakness (generalized) (M62.81);History of falling (Z91.81);Pain Pain - Right/Left: Left Pain - part of body: Arm   Activity Tolerance Patient tolerated treatment well   Patient Left in bed;with call bell/phone within reach   Nurse Communication Mobility status        Time: 7998-7215 OT Time Calculation (min): 40 min  Charges: OT General Charges $OT Visit: 1 Visit OT Treatments $Self Care/Home Management : 38-52 mins  Lucille Passy, OTR/L [place] Pager [phone] Office [phone]    Lucille Passy M 08/16/2018, 2:35 PM

## 2018-08-16 NOTE — Progress Notes (Signed)
PROGRESS NOTE    Ruben Conner, DDS  LFY:[id number] DOB: 1930/01/21 DOA: 08/11/2018 PCP: Chipper Herb, MD    Brief Narrative:  84 y.o. male with medical history significant oflong-standing asymptomatic bradycardia, pulmonary and mediastinal sarcoidosis, pulmonary and mediastinal sarcoidosis,type 2 diabetes mellitus, systolic CHF  right upper lobe radiation pneumonitis. CKD stage IV cr at baseline 2-2.5, HTN, LBBB  Admitted for fall resulting in left wrist fracture in association with acute on chronic systolic CHF.    Assessment & Plan:   Active Problems:   Type 2 diabetes mellitus with stage 3 chronic kidney disease, without long-term current use of insulin (HCC)   Hypercholesterolemia   Essential hypertension   Anemia in stage 4 chronic kidney disease (HCC)   Sarcoidosis   CKD (chronic kidney disease) stage 4, GFR 15-29 ml/min (HCC)   Acute systolic heart failure (HCC)   Paroxysmal atrial fibrillation (HCC)   Biventricular automatic implantable cardioverter defibrillator in situ   CHF exacerbation (HCC)   Left wrist fracture, closed, initial encounter   Degloving injury of left hand   Acute on Chronic systolic Heart failure; evaluated by cardiology.  Treated with IV lasix. 60 mg BID> holding lasix today due to increase in creatinine.  Presented with SOB, abdominal distension.  Cardiology following. ECHO EF 30%  Monitor renal function.  Weight 213. ---211( per patient he take torsemide 20 mg when his weight is at 197, if his weight is around 260 he takes 80 mg torsemide). Follow cardio recommendation regarding diuretics.  Urine out put yesterday 910, weight; 217---213. --220 Renal function improved. Resume diuretics per cardiology   Sarcoidosis; continue with prednisone.  sarcoidosis. Hilar mass.  Post radiation pneumonitis.  Continue with nebulizer PRN.  Appreciate pre op evaluation from pulmonologist.  On dextromethorphan.  Chest x ray stable  Continue with  nebulizer.   Chronic kidney diseases stage IV;  Cr baseline; 2.1 Avoid nephrotoxin.  Cr increase to 3.3 off diuretics.  [place], no hydronephrosis.  Cr decreased to 2.8  Anemia;  Hb baseline 9--10. Hb today 8. 6---from 9 iron and B 12 level  Drop might be post op.  Start B 12 replacement and Iron supplement.  Hb stable.   HTN;  Continue with carvedilol, PRNH hydralazine.   Paroxysmal A fib.  Biventricular automatic implantable cardioverter defibrillator in situ  Ok to resume eliquis per ortho. Renal function worse, will hold eliquis. Will discussed with cardio regarding start heparin gtt. Continue with amiodarone and carvedilol.    DM type 2.  SSI.   Left Wrist Fracture;  Clear by cardiology.  Underwent sx 9-27 Pain controlled.   Hypomagnesemia; received IV magnesium.  Repeat level.   Hypokalemia; resolved.   Constipation; no BM yet. KUB no obstruction.  Will order enema.    DVT prophylaxis: scd Code Status: full code.  Family Communication; no family at bedside.  Disposition Plan:  Remain inpatient for treatment of HR, aki, sx.    Consultants:   Cardiology    Procedures:   none  Antimicrobials:    Subjective: Breathing better, cough improved.  No BM yet   Objective: Vitals:   08/16/18 0456 08/16/18 0828 08/16/18 0926 08/16/18 0930  BP:  (!) 146/67    Pulse: 67 74 73   Resp: 14  20   Temp:      TempSrc:      SpO2: 91%  93% 93%  Weight: 100 kg     Height:        Intake/Output Summary (  Last 24 hours) at 08/16/2018 0944 Last data filed at 08/16/2018 0130 Gross per 24 hour  Intake 600 ml  Output 650 ml  Net -50 ml   Filed Weights   08/14/18 0405 08/15/18 0427 08/16/18 0456  Weight: 97.4 kg 98.6 kg 100 kg    Examination:  General exam: NAD Respiratory system: no wheezing  Cardiovascular system: S 1, S 2 RRR Gastrointestinal system: BS present, soft, nt Central nervous system: non focal.  Extremities: trace edema. Left arm splint    Skin:     Data Reviewed: I have personally reviewed following labs and imaging studies  CBC: Recent Labs  Lab 08/11/18 1611 08/12/18 0531 08/13/18 0309 08/14/18 0516 08/15/18 0250  WBC 8.5 7.8 9.5 9.0 7.5  NEUTROABS 7.1  --   --   --   --   HGB 9.6* 9.3* 9.1* 8.6* 8.1*  HCT 30.8* 29.7* 28.1* 27.1* 25.2*  MCV 88.8 88.7 85.4 86.9 86.0  PLT 245 195 208 203 962   Basic Metabolic Panel: Recent Labs  Lab 08/12/18 0531 08/13/18 0309 08/14/18 0516 08/15/18 0250 08/16/18 0303  NA 139 136 135 135 134*  K 3.9 3.2* 3.8 3.7 3.7  CL 105 99 99 98 101  CO2 25 27 26 27 25   GLUCOSE 127* 132* 151* 84 109*  BUN 23 28* 36* 40* 38*  CREATININE 2.71* 2.92* 2.95* 3.31* 2.84*  CALCIUM 8.4* 8.6* 8.2* 8.2* 8.1*  MG 1.5* 2.0  --   --   --   PHOS 3.8  --   --   --   --    GFR: Estimated Creatinine Clearance: 22.9 mL/min (A) (by C-G formula based on SCr of 2.84 mg/dL (H)). Liver Function Tests: Recent Labs  Lab 08/12/18 0531  AST 16  ALT 10  ALKPHOS 52  BILITOT 0.7  PROT 6.0*  ALBUMIN 3.1*   No results for input(s): LIPASE, AMYLASE in the last 168 hours. No results for input(s): AMMONIA in the last 168 hours. Coagulation Profile: Recent Labs  Lab 08/11/18 1611  INR 1.10   Cardiac Enzymes: Recent Labs  Lab 08/11/18 2353 08/15/18 1245  CKTOTAL  --  74  TROPONINI 0.05*  --    BNP (last 3 results) No results for input(s): PROBNP in the last 8760 hours. HbA1C: No results for input(s): HGBA1C in the last 72 hours. CBG: Recent Labs  Lab 08/15/18 1633 08/15/18 1957 08/16/18 0022 08/16/18 0353 08/16/18 0755  GLUCAP 207* 206* 89 125* 143*   Lipid Profile: No results for input(s): CHOL, HDL, LDLCALC, TRIG, CHOLHDL, LDLDIRECT in the last 72 hours. Thyroid Function Tests: No results for input(s): TSH, T4TOTAL, FREET4, T3FREE, THYROIDAB in the last 72 hours. Anemia Panel: Recent Labs    08/15/18 0250  VITAMINB12 174*  FERRITIN 30  TIBC 279  IRON 23*  RETICCTPCT  1.8   Sepsis Labs: No results for input(s): PROCALCITON, LATICACIDVEN in the last 168 hours.  Recent Results (from the past 240 hour(s))  Surgical pcr screen     Status: None   Collection Time: 08/13/18 11:06 AM  Result Value Ref Range Status   MRSA, PCR NEGATIVE NEGATIVE Final   Staphylococcus aureus NEGATIVE NEGATIVE Final    Comment: (NOTE) The Xpert SA Assay (FDA approved for NASAL specimens in patients 34 years of age and older), is one component of a comprehensive surveillance program. It is not intended to diagnose infection nor to guide or monitor treatment. Performed at [facility], [place]  [address], [place], [place] [address]          Radiology Studies: Dg Abd 1 View  Result Date: 08/15/2018 CLINICAL DATA:  dyspnea EXAM: ABDOMEN - 1 VIEW COMPARISON:  None. FINDINGS: No dilated loops of large or small bowel. Gas and stool in the rectum. No pathologic calcifications. No organomegaly. Degenerative osteophytosis of the spine. IMPRESSION: No bowel obstruction. Electronically Signed   By: Suzy Bouchard M.D.   On: 08/15/2018 10:24   US Renal  Result Date: 08/15/2018 CLINICAL DATA:  Acute kidney injury. EXAM: RENAL / URINARY TRACT ULTRASOUND COMPLETE COMPARISON:  None. FINDINGS: Right Kidney: Length: 12.6 cm. Cortical thinning is noted. Echogenicity within normal limits. No mass or hydronephrosis visualized. Left Kidney: Length: 12.4 cm. Cortical thinning is noted. Echogenicity within normal limits. No mass or hydronephrosis visualized. Bladder: Appears normal for degree of bladder distention. Ureteral jets are not visualized, but exam is somewhat limited due to body habitus. IMPRESSION: Mild bilateral renal cortical thinning is noted. No hydronephrosis or renal obstruction is noted. Electronically Signed   By: [name], M.D.   On: 08/15/2018 20:21   Dg Chest Port 1 View  Result Date: 08/15/2018 CLINICAL DATA:  dyspnea EXAM: PORTABLE CHEST 1 VIEW COMPARISON:   Radiograph 08/11/2018, CT 07/02/2018 FINDINGS: LEFT-sided pacemaker overlies stable cardiac silhouette. RIGHT perihilar masslike density corresponds to postradiation change on comparison CT. No pulmonary edema. No infiltrate. IMPRESSION: 1. No acute cardiopulmonary findings. 2. Stable postradiation change about the RIGHT hilum. Electronically Signed   By: Suzy Bouchard M.D.   On: 08/15/2018 10:23        Scheduled Meds: . amiodarone  200 mg Oral Daily  . apixaban  2.5 mg Oral BID  . budesonide (PULMICORT) nebulizer solution  0.25 mg Nebulization BID  . carvedilol  3.125 mg Oral BID WC  . ferrous sulfate  325 mg Oral BID WC  . guaiFENesin  600 mg Oral BID  . insulin aspart  0-9 Units Subcutaneous Q4H  . ipratropium-albuterol  3 mL Nebulization Q6H  . polyethylene glycol  17 g Oral BID  . predniSONE  5 mg Oral Q breakfast  . senna  1 tablet Oral BID  . sodium chloride flush  3 mL Intravenous Q12H   Continuous Infusions: . sodium chloride    . sodium chloride    . sodium chloride 10 mL/hr at 08/13/18 1506     LOS: 5 days    Time spent: 35 minutes    Elmarie Shiley, MD [facility] Pager [phone]  If 7PM-7AM, please contact night-coverage [email or web address] Password [facility] 08/16/2018, 9:44 AM

## 2018-08-17 DIAGNOSIS — W010XXA Fall on same level from slipping, tripping and stumbling without subsequent striking against object, initial encounter: Secondary | ICD-10-CM | POA: Diagnosis not present

## 2018-08-17 DIAGNOSIS — E1122 Type 2 diabetes mellitus with diabetic chronic kidney disease: Secondary | ICD-10-CM | POA: Diagnosis not present

## 2018-08-17 DIAGNOSIS — I5023 Acute on chronic systolic (congestive) heart failure: Secondary | ICD-10-CM | POA: Diagnosis not present

## 2018-08-17 DIAGNOSIS — W010XXD Fall on same level from slipping, tripping and stumbling without subsequent striking against object, subsequent encounter: Secondary | ICD-10-CM | POA: Diagnosis not present

## 2018-08-17 DIAGNOSIS — D631 Anemia in chronic kidney disease: Secondary | ICD-10-CM | POA: Diagnosis not present

## 2018-08-17 DIAGNOSIS — D860 Sarcoidosis of lung: Secondary | ICD-10-CM | POA: Diagnosis not present

## 2018-08-17 DIAGNOSIS — S0181XA Laceration without foreign body of other part of head, initial encounter: Secondary | ICD-10-CM | POA: Diagnosis not present

## 2018-08-17 DIAGNOSIS — S0181XD Laceration without foreign body of other part of head, subsequent encounter: Secondary | ICD-10-CM | POA: Diagnosis not present

## 2018-08-17 DIAGNOSIS — Z7984 Long term (current) use of oral hypoglycemic drugs: Secondary | ICD-10-CM | POA: Diagnosis not present

## 2018-08-17 DIAGNOSIS — Z23 Encounter for immunization: Secondary | ICD-10-CM | POA: Diagnosis not present

## 2018-08-17 DIAGNOSIS — Z85118 Personal history of other malignant neoplasm of bronchus and lung: Secondary | ICD-10-CM | POA: Diagnosis not present

## 2018-08-17 DIAGNOSIS — S52592D Other fractures of lower end of left radius, subsequent encounter for closed fracture with routine healing: Secondary | ICD-10-CM | POA: Diagnosis not present

## 2018-08-17 DIAGNOSIS — S61412D Laceration without foreign body of left hand, subsequent encounter: Secondary | ICD-10-CM | POA: Diagnosis not present

## 2018-08-17 DIAGNOSIS — J439 Emphysema, unspecified: Secondary | ICD-10-CM | POA: Diagnosis not present

## 2018-08-17 DIAGNOSIS — K5909 Other constipation: Secondary | ICD-10-CM | POA: Diagnosis not present

## 2018-08-17 DIAGNOSIS — J701 Chronic and other pulmonary manifestations due to radiation: Secondary | ICD-10-CM | POA: Diagnosis not present

## 2018-08-17 DIAGNOSIS — Y842 Radiological procedure and radiotherapy as the cause of abnormal reaction of the patient, or of later complication, without mention of misadventure at the time of the procedure: Secondary | ICD-10-CM | POA: Diagnosis not present

## 2018-08-17 DIAGNOSIS — R918 Other nonspecific abnormal finding of lung field: Secondary | ICD-10-CM | POA: Diagnosis not present

## 2018-08-17 DIAGNOSIS — S52612D Displaced fracture of left ulna styloid process, subsequent encounter for closed fracture with routine healing: Secondary | ICD-10-CM | POA: Diagnosis not present

## 2018-08-17 DIAGNOSIS — W19XXXA Unspecified fall, initial encounter: Secondary | ICD-10-CM | POA: Diagnosis not present

## 2018-08-17 DIAGNOSIS — Z923 Personal history of irradiation: Secondary | ICD-10-CM | POA: Diagnosis not present

## 2018-08-17 DIAGNOSIS — I10 Essential (primary) hypertension: Secondary | ICD-10-CM | POA: Diagnosis not present

## 2018-08-17 DIAGNOSIS — I428 Other cardiomyopathies: Secondary | ICD-10-CM | POA: Diagnosis not present

## 2018-08-17 DIAGNOSIS — N184 Chronic kidney disease, stage 4 (severe): Secondary | ICD-10-CM | POA: Diagnosis not present

## 2018-08-17 DIAGNOSIS — E785 Hyperlipidemia, unspecified: Secondary | ICD-10-CM | POA: Diagnosis not present

## 2018-08-17 DIAGNOSIS — I5021 Acute systolic (congestive) heart failure: Secondary | ICD-10-CM | POA: Diagnosis not present

## 2018-08-17 DIAGNOSIS — I700 Atherosclerosis of aorta: Secondary | ICD-10-CM | POA: Diagnosis not present

## 2018-08-17 DIAGNOSIS — I480 Paroxysmal atrial fibrillation: Secondary | ICD-10-CM | POA: Diagnosis not present

## 2018-08-17 DIAGNOSIS — R001 Bradycardia, unspecified: Secondary | ICD-10-CM | POA: Diagnosis not present

## 2018-08-17 DIAGNOSIS — N179 Acute kidney failure, unspecified: Secondary | ICD-10-CM | POA: Diagnosis not present

## 2018-08-17 DIAGNOSIS — Z95810 Presence of automatic (implantable) cardiac defibrillator: Secondary | ICD-10-CM | POA: Diagnosis not present

## 2018-08-17 LAB — BASIC METABOLIC PANEL
Anion gap: 7 (ref 5–15)
BUN: 40 mg/dL — ABNORMAL HIGH (ref 8–23)
CALCIUM: 8.4 mg/dL — AB (ref 8.9–10.3)
CO2: 27 mmol/L (ref 22–32)
CREATININE: 2.57 mg/dL — AB (ref 0.61–1.24)
Chloride: 101 mmol/L (ref 98–111)
GFR calc non Af Amer: 21 mL/min — ABNORMAL LOW (ref >60)
GFR, EST AFRICAN AMERICAN: 24 mL/min — AB (ref >60)
Glucose, Bld: 166 mg/dL — ABNORMAL HIGH (ref 70–99)
Potassium: 3.7 mmol/L (ref 3.5–5.1)
SODIUM: 135 mmol/L (ref 135–145)

## 2018-08-17 LAB — GLUCOSE, CAPILLARY
GLUCOSE-CAPILLARY: 239 mg/dL — AB (ref 70–99)
Glucose-Capillary: 179 mg/dL — ABNORMAL HIGH (ref 70–99)

## 2018-08-17 LAB — CBC
HEMATOCRIT: 26.1 % — AB (ref 39.0–52.0)
Hemoglobin: 8.4 g/dL — ABNORMAL LOW (ref 13.0–17.0)
MCH: 27.6 pg (ref 26.0–34.0)
MCHC: 32.2 g/dL (ref 30.0–36.0)
MCV: 85.9 fL (ref 78.0–100.0)
Platelets: 223 10*3/uL (ref 150–400)
RBC: 3.04 MIL/uL — ABNORMAL LOW (ref 4.22–5.81)
RDW: 13.8 % (ref 11.5–15.5)
WBC: 6.4 10*3/uL (ref 4.0–10.5)

## 2018-08-17 MED ORDER — PREDNISONE 10 MG PO TABS: 5.0000 mg | ORAL_TABLET | Freq: Every day | ORAL | 0 refills | Status: DC

## 2018-08-17 MED ORDER — TORSEMIDE 20 MG PO TABS: 60.0000 mg | ORAL_TABLET | Freq: Every day | ORAL | 0 refills | Status: DC

## 2018-08-17 MED ORDER — CYANOCOBALAMIN 100 MCG PO TABS: 100.0000 ug | ORAL_TABLET | Freq: Every day | ORAL | 0 refills | Status: AC

## 2018-08-17 MED ORDER — VITAMIN B-12 100 MCG PO TABS
100.0000 ug | ORAL_TABLET | Freq: Every day | ORAL | Status: DC
  Filled 2018-08-17: qty 1

## 2018-08-17 MED ORDER — POLYETHYLENE GLYCOL 3350 17 G PO PACK: 17.0000 g | PACK | Freq: Two times a day (BID) | ORAL | 0 refills | Status: DC

## 2018-08-17 MED ORDER — FERROUS SULFATE 325 (65 FE) MG PO TABS: 325.0000 mg | ORAL_TABLET | Freq: Two times a day (BID) | ORAL | 3 refills | Status: DC

## 2018-08-17 MED ORDER — TORSEMIDE 20 MG PO TABS
60.0000 mg | ORAL_TABLET | Freq: Every day | ORAL | Status: DC
  Filled 2018-08-17: qty 3

## 2018-08-17 MED ORDER — BUDESONIDE 0.25 MG/2ML IN SUSP: 0.2500 mg | Freq: Two times a day (BID) | RESPIRATORY_TRACT | 12 refills | Status: DC

## 2018-08-17 MED ORDER — GUAIFENESIN ER 600 MG PO TB12: 600.0000 mg | ORAL_TABLET | Freq: Two times a day (BID) | ORAL | 0 refills | Status: DC

## 2018-08-17 MED ORDER — SENNA 8.6 MG PO TABS: 1.0000 | ORAL_TABLET | Freq: Two times a day (BID) | ORAL | 0 refills | Status: AC

## 2018-08-17 MED ORDER — IPRATROPIUM-ALBUTEROL 0.5-2.5 (3) MG/3ML IN SOLN: 3.0000 mL | Freq: Four times a day (QID) | RESPIRATORY_TRACT | 0 refills | Status: DC | PRN

## 2018-08-17 MED ORDER — DEXTROMETHORPHAN POLISTIREX ER 30 MG/5ML PO SUER: 15.0000 mg | Freq: Two times a day (BID) | ORAL | 0 refills | Status: DC | PRN

## 2018-08-17 MED ORDER — HYDROCODONE-ACETAMINOPHEN 5-325 MG PO TABS: 1.0000 | ORAL_TABLET | ORAL | 0 refills | Status: DC | PRN

## 2018-08-17 NOTE — Progress Notes (Signed)
08/17/2018 1550 Attempted to call report to [facility]. [place]

## 2018-08-17 NOTE — Progress Notes (Signed)
Clinical Social Worker facilitated patient discharge including contacting patient family and facility to confirm patient discharge plans.  Clinical information faxed to facility and family agreeable with plan.  Per daughter in law patients son Rosalita Chessman will transport patient to [facility] via [facility] .  RN to call [phone] and ask for 300 hall RN (pt will go in rm# [phone]) for report prior to discharge.  Clinical Social Worker will sign off for now as social work intervention is no longer needed. Please consult [place] again if new need arises.  Rhea Pink, MSW, [place]

## 2018-08-17 NOTE — Consult Note (Addendum)
[facility] Primary Care Navigator  08/17/2018  ALECK LOCKLIN, DDS Jul 28, 1930 [id number]   Met withpatient (retired Pharmacist, community) and son Ron Agee) at the bedside toidentify possible discharge needs.  Patient reports that he "tripped and fell at nephrologist's office" thatresulted to thisadmission/ surgery.(facial laceration and left wrist fracture in association with acute on chronic systolic congestive HF, status post open reduction internal fixation of left wrist)     Patient endorses Dr. Redge Gainer with [facility] as his primary care [facility].   [facility] [facility] [place] [facility].  Patient manages his own medications athomestraight out of the containers.  Patient has been driving prior to admission/ surgery but his son and son's wife Opal Sidles) or niece Guerry Minors) and her husband Sonia Side) will be able to providetransportationto his doctors' appointments after discharge.  Patientlives at [employer] wife (had stroke) and he is the primary caregiver for her. Since son and daughter in-law have other family obligations, patient has plans to hire caregiver at home to assist him and wife.  Anticipated plan for discharge isskilled nursing facility ([facility])for rehabilitationper therapy recommendation, prior to returning home.  Patient voiced understandingto callprimary care provider's office whenhereturns backhome,for a post discharge follow-up visit within1- 2 weeksor sooner if needs arise.Patient letter (with PCP's contact number) was provided asareminder.   Explained topatient regarding [facility] CM services available for health management andresourcesat homeand had voiced interest about it. Patient had expressed understanding todiscuss with primary care provider onhisnext visit aboutfurther needs and assistancein managing health issues- once he  getsback home.  Patient voicedunderstandingto seekreferral from primary care provider to [facility] care management asdeemed necessary and appropriatefor anyservicesin the nearfuture-when he willreturn home.   [facility] care management information was provided for futureneeds thathe may have.  Primary care provider's office is listed as providing transition of care (TOC) follow-up.   For additional questions please contact:  Edwena Felty A. Tabius Rood, BSN, RN-BC [facility] PRIMARY CARE Navigator Cell: [phone]

## 2018-08-17 NOTE — Progress Notes (Signed)
Occupational Therapy Treatment Patient Details Name: Ruben Conner, DDS MRN: [id number] DOB: 1930/06/03 Today's Date: 08/17/2018    History of present illness [name] is a 84 year old male with chronic systolic heart failure, dual-chamber CRT-P device, [employer], Dr. Lovena Le, with EF of 35%, 98% biventricular pacing with no atrial fibrillation who previously received primary bronchogenic carcinoma therapy central right upper lobe with radiation, chronic kidney disease 2.7 creatinine, dementia of his wife, Ruben Conner, who unfortunately suffered a fall and injured hand, wrist significantly requiring surgical repair by Dr. Apolonio Schneiders.  Pt reports his knees gave out on him causing him to fall.    OT comments  Pt demonstrates improving balance during ADLs - requires min - mod A currently for ADLs.  AAROM performed Lt shoulder, AROM left digits.  Pt reports he now plans for SNF with his wife, which is safest option.    Follow Up Recommendations  SNF;Supervision/Assistance - 24 hour    Equipment Recommendations  None recommended by OT    Recommendations for Other Services      Precautions / Restrictions Precautions Precautions: Fall Required Braces or Orthoses: Sling Restrictions Weight Bearing Restrictions: Yes LUE Weight Bearing: Non weight bearing       Mobility Bed Mobility Overal bed mobility: Needs Assistance             General bed mobility comments: up in chair   Transfers Overall transfer level: Needs assistance Equipment used: Hemi-[place] Transfers: Sit to/from Stand;Stand Pivot Transfers Sit to Stand: Min guard Stand pivot transfers: Min guard       General transfer comment: assist for balance and safety     Balance Overall balance assessment: Needs assistance Sitting-balance support: Feet supported Sitting balance-Leahy Scale: Good     Standing balance support: Single extremity supported Standing balance-Leahy Scale: Poor Standing balance comment: reliant  on UE support or min A                            ADL either performed or assessed with clinical judgement   ADL Overall ADL's : Needs assistance/impaired Eating/Feeding: Set up;Bed level;Sitting   Grooming: Wash/dry hands;Wash/dry face;Oral care;Brushing hair;Minimal assistance;Standing                   Toilet Transfer: Min guard;Ambulation;Comfort height toilet;Grab bars(hemi Garguilo )           Functional mobility during ADLs: Min guard(hemi Stigger )       Vision       Perception     Praxis      Cognition Arousal/Alertness: Awake/alert Behavior During Therapy: WFL for tasks assessed/performed Overall Cognitive Status: Within Functional Limits for tasks assessed                                          Exercises Exercises: Other exercises General Exercises - Lower Extremity Ankle Circles/Pumps: AROM;Both;10 reps;Supine Long Arc Quad: AROM;Both;10 reps;Seated Other Exercises Other Exercises: Performed AROM of fingers coposite flex/ext x 10 actively; AAROM shoulder flexion to ~120* x 10; AAROM shoulder abduction to 90*   Shoulder Instructions       General Comments sling in place    Pertinent Vitals/ Pain       Pain Assessment: Faces Faces Pain Scale: Hurts a little bit Pain Location: left little finger  Pain Descriptors / Indicators: Aching;Grimacing;Guarding Pain Intervention(s): Monitored  during session  Home Living                                          Prior Functioning/Environment              Frequency  Min 2X/week        Progress Toward Goals  OT Goals(current goals can now be found in the care plan section)  Progress towards OT goals: Progressing toward goals  Acute Rehab OT Goals Patient Stated Goal: to go home and be able to get around  Plan Discharge plan remains appropriate    Co-evaluation                 AM-PAC PT "6 Clicks" Daily Activity     Outcome  Measure   Help from another person eating meals?: A Little Help from another person taking care of personal grooming?: A Little Help from another person toileting, which includes using toliet, bedpan, or urinal?: A Lot Help from another person bathing (including washing, rinsing, drying)?: A Lot Help from another person to put on and taking off regular upper body clothing?: A Lot Help from another person to put on and taking off regular lower body clothing?: A Little 6 Click Score: 15    End of Session Equipment Utilized During Treatment: Gait belt  OT Visit Diagnosis: Unsteadiness on feet (R26.81);Muscle weakness (generalized) (M62.81);History of falling (Z91.81);Pain Pain - Right/Left: Left Pain - part of body: Arm   Activity Tolerance Patient tolerated treatment well   Patient Left in chair;with call bell/phone within reach   Nurse Communication          Time: 4270-6237 OT Time Calculation (min): 17 min  Charges: OT General Charges $OT Visit: 1 Visit OT Treatments $Therapeutic Activity: 8-22 mins  Lucille Passy, OTR/L [place] Pager [phone] Office [phone]    Lucille Passy M 08/17/2018, 1:18 PM

## 2018-08-17 NOTE — Progress Notes (Signed)
08/17/2018 1425 Discharge AVS meds taken today and those due this evening reviewed.  Follow-up appointments and when to call md reviewed.  D/C IV and TELE.  Questions and concerns addressed.   D/C to [facility] per son. [place]

## 2018-08-17 NOTE — Progress Notes (Signed)
Progress Note  Patient Name: Ruben Conner, DDS Date of Encounter: 08/17/2018  Primary Cardiologist: Sanda Klein, MD   Subjective   Eating breakfast in chair no cardiac complaints Sounds wheezy   Inpatient Medications    Scheduled Meds: . amiodarone  200 mg Oral Daily  . apixaban  2.5 mg Oral BID  . budesonide (PULMICORT) nebulizer solution  0.25 mg Nebulization BID  . carvedilol  3.125 mg Oral BID WC  . ferrous sulfate  325 mg Oral BID WC  . guaiFENesin  600 mg Oral BID  . insulin aspart  0-9 Units Subcutaneous TID WC  . milk and molasses  1 enema Rectal Once  . polyethylene glycol  17 g Oral BID  . predniSONE  5 mg Oral Q breakfast  . senna  1 tablet Oral BID  . sodium chloride flush  3 mL Intravenous Q12H   Continuous Infusions: . sodium chloride    . sodium chloride    . sodium chloride 10 mL/hr at 08/13/18 1506   PRN Meds: sodium chloride, acetaminophen **OR** acetaminophen, dextromethorphan, fentaNYL (SUBLIMAZE) injection, hydrALAZINE, HYDROcodone-acetaminophen, ipratropium-albuterol, ondansetron **OR** ondansetron (ZOFRAN) IV, sodium chloride flush, zolpidem   Vital Signs    Vitals:   08/16/18 2123 08/17/18 0330 08/17/18 0500 08/17/18 0812  BP:   (!) 156/72 (!) 162/82  Pulse:   65 62  Resp:  15    Temp:   98.9 F (37.2 C) (!) 97.4 F (36.3 C)  TempSrc:   Oral Oral  SpO2: 97%  99% 94%  Weight:   96.2 kg   Height:        Intake/Output Summary (Last 24 hours) at 08/17/2018 0831 Last data filed at 08/17/2018 0817 Gross per 24 hour  Intake 243 ml  Output 600 ml  Net -357 ml   Filed Weights   08/15/18 0427 08/16/18 0456 08/17/18 0500  Weight: 98.6 kg 100 kg 96.2 kg    Telemetry    V pacing   ECG    No new tracing- Personally Reviewed  Physical Exam  BP (!) 162/82 (BP Location: Right Arm)   Pulse 62   Temp (!) 97.4 F (36.3 C) (Oral)   Resp 15   Ht 6' (1.829 m)   Wt 96.2 kg   SpO2 94%   BMI 28.77 kg/m  Affect appropriate Elderly  white male  HEENT: laceration over left eye  Neck supple with no adenopathy JVP normal no bruits no thyromegaly Lungs  Exp wheezing and good diaphragmatic motion Heart:  S1/S2 no murmur, no rub, gallop or click PMI normal AICD under left clavicle  Abdomen: benighn, BS positve, no tenderness, no AAA no bruit.  No HSM or HJR Distal pulses intact with no bruits No edema varicosities bilateral  Neuro non-focal Skin warm and dry No muscular weakness   Labs    Chemistry Recent Labs  Lab 08/12/18 0531  08/15/18 0250 08/16/18 0303 08/17/18 0335  NA 139   < > 135 134* 135  K 3.9   < > 3.7 3.7 3.7  CL 105   < > 98 101 101  CO2 25   < > 27 25 27   GLUCOSE 127*   < > 84 109* 166*  BUN 23   < > 40* 38* 40*  CREATININE 2.71*   < > 3.31* 2.84* 2.57*  CALCIUM 8.4*   < > 8.2* 8.1* 8.4*  PROT 6.0*  --   --   --   --   ALBUMIN  3.1*  --   --   --   --   AST 16  --   --   --   --   ALT 10  --   --   --   --   ALKPHOS 52  --   --   --   --   BILITOT 0.7  --   --   --   --   GFRNONAA 20*   < > 16* 19* 21*  GFRAA 23*   < > 18* 22* 24*  ANIONGAP 9   < > 10 8 7    < > = values in this interval not displayed.     Hematology Recent Labs  Lab 08/14/18 0516 08/15/18 0250 08/16/18 1057 08/17/18 0335  WBC 9.0 7.5  --  6.4  RBC 3.12* 2.93*  2.93*  --  3.04*  HGB 8.6* 8.1* 8.6* 8.4*  HCT 27.1* 25.2* 26.9* 26.1*  MCV 86.9 86.0  --  85.9  MCH 27.6 27.6  --  27.6  MCHC 31.7 32.1  --  32.2  RDW 14.0 13.9  --  13.8  PLT 203 191  --  223    Cardiac Enzymes Recent Labs  Lab 08/11/18 2353  TROPONINI 0.05*   No results for input(s): TROPIPOC in the last 168 hours.   BNP Recent Labs  Lab 08/11/18 1611 08/15/18 0847  BNP 369.4* 323.5*     DDimer No results for input(s): DDIMER in the last 168 hours.   Radiology    Dg Abd 1 View  Result Date: 08/15/2018 CLINICAL DATA:  dyspnea EXAM: ABDOMEN - 1 VIEW COMPARISON:  None. FINDINGS: No dilated loops of large or small bowel. Gas and  stool in the rectum. No pathologic calcifications. No organomegaly. Degenerative osteophytosis of the spine. IMPRESSION: No bowel obstruction. Electronically Signed   By: Suzy Bouchard M.D.   On: 08/15/2018 10:24   US Renal  Result Date: 08/15/2018 CLINICAL DATA:  Acute kidney injury. EXAM: RENAL / URINARY TRACT ULTRASOUND COMPLETE COMPARISON:  None. FINDINGS: Right Kidney: Length: 12.6 cm. Cortical thinning is noted. Echogenicity within normal limits. No mass or hydronephrosis visualized. Left Kidney: Length: 12.4 cm. Cortical thinning is noted. Echogenicity within normal limits. No mass or hydronephrosis visualized. Bladder: Appears normal for degree of bladder distention. Ureteral jets are not visualized, but exam is somewhat limited due to body habitus. IMPRESSION: Mild bilateral renal cortical thinning is noted. No hydronephrosis or renal obstruction is noted. Electronically Signed   By: [name], M.D.   On: 08/15/2018 20:21   Dg Chest Port 1 View  Result Date: 08/15/2018 CLINICAL DATA:  dyspnea EXAM: PORTABLE CHEST 1 VIEW COMPARISON:  Radiograph 08/11/2018, CT 07/02/2018 FINDINGS: LEFT-sided pacemaker overlies stable cardiac silhouette. RIGHT perihilar masslike density corresponds to postradiation change on comparison CT. No pulmonary edema. No infiltrate. IMPRESSION: 1. No acute cardiopulmonary findings. 2. Stable postradiation change about the RIGHT hilum. Electronically Signed   By: Suzy Bouchard M.D.   On: 08/15/2018 10:23    Cardiac Studies   Echo 08/13/2018 Study Conclusions  - Left ventricle: The cavity size was normal. Wall thickness was increased in a pattern of moderate LVH. Systolic function was severely reduced. The estimated ejection fraction was in the range of 25% to 30%. Diffuse hypokinesis. Doppler parameters are consistent with abnormal left ventricular relaxation (grade 1 diastolic dysfunction). - Aortic root: The aortic root was mildly  dilated.  Impressions:  - Severe global reduction in LV systolic function;  moderate LVH; mild diastolic dysfunction; mildly dilated aortic root.  Patient Profile     84 y.o. male with chronic systolic heart failure with severely depressed left ventricular systolic function, CRT-D, paroxysmal atrial fibrillation, sarcoidosis, CKD stage IV, hypertension hypercholesterolemia, admitted after a fall complicated by left wrist fracture  Assessment & Plan    1. CHF: improved diuretic held due to elevated Cr which is improving since not volume overloaded continue to hold  2. AFib:  100% biventricular pacing.  Telemetry with AV pacing this am 3. CRT-D:  Seen by EP 08/16/18 normal device function  4. Sarcoidosis: On chronic prednisone. 5.  Acute on chronic KD: Creatinine up to 2.57  Diuretics on hold.  His nephrologist is Dr. Joelyn Oms (in fact his fall happened when he was walking towards Dr. Adin Hector office for an appointment). Resume previous home dose of diuretic before d/c   Will sign off Outpatient f/u with Dr Lovena Le in 6 months   For questions or updates, please contact [facility] Please consult [email or web address] for contact info under        Signed, Jenkins Rouge, MD  08/17/2018, 8:31 AM

## 2018-08-17 NOTE — Care Management Important Message (Signed)
Important Message  Patient Details  Name: Ruben Conner, Ruben Conner MRN: [id number] Date of Birth: 1930-04-05   Medicare Important Message Given:  Yes    Rainah Kirshner P Jomo Forand 08/17/2018, 1:16 PM

## 2018-08-17 NOTE — Progress Notes (Signed)
Physical Therapy Treatment Patient Details Name: Ruben Conner, DDS MRN: [id number] DOB: Feb 28, 1930 Today's Date: 08/17/2018    History of Present Illness Mr. Franko is a 84 year old male with chronic systolic heart failure, dual-chamber CRT-P device, [employer], Dr. Lovena Le, with EF of 35%, 98% biventricular pacing with no atrial fibrillation who previously received primary bronchogenic carcinoma therapy central right upper lobe with radiation, chronic kidney disease 2.7 creatinine, dementia of his wife, [name], who unfortunately suffered a fall and injured hand, wrist significantly requiring surgical repair by Dr. Apolonio Schneiders.  Pt reports his knees gave out on him causing him to fall.     PT Comments    Pt admitted with above diagnosis. Pt currently with functional limitations due to balance and endurance deficits. Pt was able to ambulate with hemiwalker into bathroom so that nursing can give enema.  Pt plans to d/c to SNF today and continue therapy.   Pt will benefit from skilled PT to increase their independence and safety with mobility to allow discharge to the venue listed below.     Follow Up Recommendations  SNF;Supervision/Assistance - 24 hour(wants to go home and hire caregiver, wife disabled too)     Equipment Recommendations  (TBA)    Recommendations for Other Services       Precautions / Restrictions Precautions Precautions: Fall Required Braces or Orthoses: Sling Restrictions Weight Bearing Restrictions: Yes(L fractured arm) LUE Weight Bearing: Non weight bearing    Mobility  Bed Mobility Overal bed mobility: Needs Assistance             General bed mobility comments: pt was in chair on arrival  Transfers Overall transfer level: Needs assistance Equipment used: Hemi-[place] Transfers: Sit to/from Stand;Stand Pivot Transfers Sit to Stand: Min guard         General transfer comment: assist for balance and safety from low recliner to  hemiwalker  Ambulation/Gait Ambulation/Gait assistance: Min assist Gait Distance (Feet): 20 Feet Assistive device: Hemi-[place] Gait Pattern/deviations: Step-to pattern;Decreased step length - right;Decreased step length - left;Decreased stride length;Shuffle;Decreased weight shift to left;Antalgic   Gait velocity interpretation: <1.31 ft/sec, indicative of household ambulator General Gait Details: Pt still takes small steps at times, needed cues for sequencing steps and hemiwalker.  Initially getting hemiwalker caught on objects but got better the longer he walked.  Pt was much steadier with the hemiwalker but needs more practice with technique and posture with challenges.  Limited distance due to pt needed to walk to toilet in bathroom to get enema.  Nursing came in to give enema at end of treatment.    Stairs             Wheelchair Mobility    Modified Rankin (Stroke Patients Only)       Balance Overall balance assessment: Needs assistance Sitting-balance support: Feet supported Sitting balance-Leahy Scale: Good     Standing balance support: Single extremity supported Standing balance-Leahy Scale: Poor Standing balance comment: reliant on UE support                             Cognition Arousal/Alertness: Awake/alert Behavior During Therapy: WFL for tasks assessed/performed Overall Cognitive Status: Within Functional Limits for tasks assessed                                        Exercises General Exercises - Lower  Extremity Ankle Circles/Pumps: AROM;Both;10 reps;Supine Long Arc Quad: AROM;Both;10 reps;Seated    General Comments General comments (skin integrity, edema, etc.): sling in place      Pertinent Vitals/Pain Pain Assessment: Faces Faces Pain Scale: Hurts little more Pain Location: left wrist Pain Descriptors / Indicators: Aching;Grimacing;Guarding Pain Intervention(s): Limited activity within patient's  tolerance;Monitored during session;Repositioned    Home Living                      Prior Function            PT Goals (current goals can now be found in the care plan section) Acute Rehab PT Goals Patient Stated Goal: to go home and be able to get around Progress towards PT goals: Progressing toward goals    Frequency    Min 3X/week      PT Plan Current plan remains appropriate    Co-evaluation              AM-PAC PT "6 Clicks" Daily Activity  Outcome Measure  Difficulty turning over in bed (including adjusting bedclothes, sheets and blankets)?: Unable Difficulty moving from lying on back to sitting on the side of the bed? : Unable Difficulty sitting down on and standing up from a chair with arms (e.g., wheelchair, bedside commode, etc,.)?: A Little Help needed moving to and from a bed to chair (including a wheelchair)?: A Little Help needed walking in hospital room?: A Little Help needed climbing 3-5 steps with a railing? : A Lot 6 Click Score: 13    End of Session Equipment Utilized During Treatment: Gait belt Activity Tolerance: Patient limited by fatigue;Patient limited by pain Patient left: with call bell/phone within reach(on toilet) Nurse Communication: Mobility status;Weight bearing status PT Visit Diagnosis: Unsteadiness on feet (R26.81);Muscle weakness (generalized) (M62.81);Pain;History of falling (Z91.81) Pain - Right/Left: Left Pain - part of body: (wrist)     Time: 1050-1102 PT Time Calculation (min) (ACUTE ONLY): 12 min  Charges:  $Gait Training: 8-22 mins                     [place] Pager:  [phone]  Office:  [place] 08/17/2018, 11:44 AM

## 2018-08-17 NOTE — Discharge Summary (Signed)
Physician Discharge Summary  FAHED MORTEN, DDS HBZ:[id number] DOB: Dec 21, 1940 DOA: 08/11/2018  PCP: Chipper Herb, MD  Admit date: 08/11/2018 Discharge date: 08/17/2018  Admitted From: Home  Disposition:  SNF  Recommendations for Outpatient Follow-up:  1. Follow up with PCP in 1-2 weeks 2. Please obtain BMP/CBC in one week 3. Make sure patient follow with his nephrologist.  Needs to follow up with ortho post sx; in 2 weeks. orhto recommendations; Continue with sugar tong splint keeping it clean and dry. NWB to the LUE. Ice and elevation to the LUE.    Discharge Condition: stable.  CODE STATUS ; full code.  Diet recommendation: Heart Healthy  Brief/Interim Summary: Brief Narrative:  84 y.o.malewith medical history significant oflong-standing asymptomatic bradycardia,pulmonary and mediastinal sarcoidosis,pulmonary and mediastinal sarcoidosis,type 2 diabetes mellitus, systolic CHFright upper lobe radiation pneumonitis.CKD stage IV cr at baseline 2-2.5, HTN, LBBBAdmitted for fall resulting in left wrist fracture in association with acute on chronic systolic CHF.   Assessment & Plan:   Active Problems:   Type 2 diabetes mellitus with stage 3 chronic kidney disease, without long-term current use of insulin (HCC)   Hypercholesterolemia   Essential hypertension   Anemia in stage 4 chronic kidney disease (HCC)   Sarcoidosis   CKD (chronic kidney disease) stage 4, GFR 15-29 ml/min (HCC)   Acute systolic heart failure (HCC)   Paroxysmal atrial fibrillation (HCC)   Biventricular automatic implantable cardioverter defibrillator in situ   CHF exacerbation (HCC)   Left wrist fracture, closed, initial encounter   Degloving injury of left hand   Acute on Chronic systolic Heart failure; evaluated by cardiology.  Treated with IV lasix. 60 mg BID> holding lasix today due to increase in creatinine.  Presented with SOB, abdominal distension.  Cardiology following. ECHO EF 30%   Monitor renal function.  Weight 213. ---211( per patient he take torsemide 20 mg when his weight is at 197, if his weight is around 260 he takes 80 mg torsemide). Follow cardio recommendation regarding diuretics.  Urine out put yesterday 910, weight; 217---213. --220 Renal function improved. Resume torsemide 60 mg daily, monitor urine function and weight.   Sarcoidosis; continue with prednisone.  sarcoidosis. Hilar mass.  Post radiation pneumonitis.  Continue with nebulizer PRN.  Appreciate pre op evaluation from pulmonologist.  On dextromethorphan.  Chest x ray stable  Continue with nebulizer.  Stable.   Chronic kidney diseases stage IV;  Cr baseline; 2.1 Avoid nephrotoxin.  Cr increase to 3.3 off diuretics.  [place], no hydronephrosis.  Cr decreased to 2.8--2.5 Monitor on torsemide.   Anemia;  Hb baseline 9--10. Hb today 8. 6---from 9 iron and B 12 level 176 low.  Drop might be post op.  Start B 12 replacement and Iron supplement. received two doses IM 1000 mcg. Discharge on oral B12 supplement  Hb stable.   HTN;  Continue with carvedilol, PRNH hydralazine.   Paroxysmal A fib.  Biventricular automatic implantable cardioverter defibrillator in situ .  Continue with amiodarone and carvedilol.  Device interrogated this admission, no adjustment made.  On eliquis.   DM type 2.  SSI. Resume oral medications at discharge.   Left Wrist Fracture;  Clear by cardiology.  Underwent sx 9-27 Pain controlled.   Hypomagnesemia; received IV magnesium.    Hypokalemia; resolved.   Constipation; no BM yet. KUB no obstruction.  Enema today.   Discharge Diagnoses:  Active Problems:   Type 2 diabetes mellitus with stage 3 chronic kidney disease, without long-term current use of  insulin (HCC)   Hypercholesterolemia   Essential hypertension   Anemia in stage 4 chronic kidney disease (HCC)   Sarcoidosis   CKD (chronic kidney disease) stage 4, GFR 15-29 ml/min (HCC)    Acute systolic heart failure (HCC)   Paroxysmal atrial fibrillation (HCC)   Biventricular automatic implantable cardioverter defibrillator in situ   CHF exacerbation (HCC)   Left wrist fracture, closed, initial encounter   Degloving injury of left hand    Discharge Instructions  Discharge Instructions    Diet - low sodium heart healthy   Complete by:  As directed    Increase activity slowly   Complete by:  As directed      Allergies as of 08/17/2018   No Known Allergies     Medication List    STOP taking these medications   potassium chloride SA 20 MEQ tablet Commonly known as:  K-DUR,KLOR-CON     TAKE these medications   acetaminophen 500 MG tablet Commonly known as:  TYLENOL Take 1,000 mg by mouth every 6 (six) hours as needed for moderate pain or headache.   amiodarone 200 MG tablet Commonly known as:  PACERONE Take 1 tablet (200 mg total) by mouth daily.   apixaban 2.5 MG Tabs tablet Commonly known as:  ELIQUIS Take 1 tablet (2.5 mg total) by mouth 2 (two) times daily.   budesonide 0.25 MG/2ML nebulizer solution Commonly known as:  PULMICORT Take 2 mLs (0.25 mg total) by nebulization 2 (two) times daily.   carvedilol 3.125 MG tablet Commonly known as:  COREG Take 1 tablet (3.125 mg total) by mouth 2 (two) times daily.   cyanocobalamin 100 MCG tablet Take 1 tablet (100 mcg total) by mouth daily.   dextromethorphan 30 MG/5ML liquid Commonly known as:  DELSYM Take 2.5 mLs (15 mg total) by mouth 2 (two) times daily as needed for cough.   ferrous sulfate 325 (65 FE) MG tablet Take 1 tablet (325 mg total) by mouth 2 (two) times daily with a meal.   glimepiride 4 MG tablet Commonly known as:  AMARYL TAKE 1 TABLET DAILY What changed:  when to take this   glucose blood test strip Twice Daily   guaiFENesin 600 MG 12 hr tablet Commonly known as:  MUCINEX Take 1 tablet (600 mg total) by mouth 2 (two) times daily.   HYDROcodone-acetaminophen 5-325 MG  tablet Commonly known as:  NORCO/VICODIN Take 1-2 tablets by mouth every 4 (four) hours as needed for moderate pain.   ipratropium-albuterol 0.5-2.5 (3) MG/3ML Soln Commonly known as:  DUONEB Take 3 mLs by nebulization every 6 (six) hours as needed.   polyethylene glycol packet Commonly known as:  MIRALAX / GLYCOLAX Take 17 g by mouth 2 (two) times daily.   predniSONE 10 MG tablet Commonly known as:  DELTASONE Take 0.5 tablets (5 mg total) by mouth daily. What changed:  See the new instructions.   senna 8.6 MG Tabs tablet Commonly known as:  SENOKOT Take 1 tablet (8.6 mg total) by mouth 2 (two) times daily.   torsemide 20 MG tablet Commonly known as:  DEMADEX Take 3 tablets (60 mg total) by mouth daily. What changed:    how much to take  how to take this  when to take this  additional instructions   Vitamin D3 5000 units Caps Take 5,000 Units by mouth every evening.      Follow-up Information    Iran Planas, MD In 2 weeks.   Specialty:  Orthopedic Surgery Contact  information: [address] [place] [address] [phone]          No Known Allergies  Consultations: Cardiology orthopedic CCM   Procedures/Studies: Dg Wrist Complete Left  Addendum Date: 08/13/2018   ADDENDUM REPORT: 08/13/2018 14:14 ADDENDUM: The fracture is in the distal left radius, not right. Electronically Signed   By: [name], M.D.   On: 08/13/2018 14:14   Result Date: 08/13/2018 CLINICAL DATA:  Left wrist pain after fall today. EXAM: LEFT WRIST - COMPLETE 3+ VIEW COMPARISON:  None. FINDINGS: Moderately displaced ulnar styloid fracture is noted. Posteriorly displaced and probably comminuted fracture of distal right radius is noted. Vascular calcifications are noted. IMPRESSION: Posteriorly displaced and probably comminuted distal right radial fracture. Moderately displaced ulnar styloid fracture. Electronically Signed: By: [name], M.D. On:  08/11/2018 15:45   Dg Abd 1 View  Result Date: 08/15/2018 CLINICAL DATA:  dyspnea EXAM: ABDOMEN - 1 VIEW COMPARISON:  None. FINDINGS: No dilated loops of large or small bowel. Gas and stool in the rectum. No pathologic calcifications. No organomegaly. Degenerative osteophytosis of the spine. IMPRESSION: No bowel obstruction. Electronically Signed   By: Suzy Bouchard M.D.   On: 08/15/2018 10:24   Ct Head Wo Contrast  Result Date: 08/11/2018 CLINICAL DATA:  Fall, laceration and hematoma over the left frontal orbital region. EXAM: CT HEAD WITHOUT CONTRAST TECHNIQUE: Contiguous axial images were obtained from the base of the skull through the vertex without intravenous contrast. COMPARISON:  None. FINDINGS: Brain: Advanced brain atrophy and chronic white matter microvascular changes with ventricular enlargement. No acute intracranial hemorrhage, mass lesion, definite new infarction, midline shift, herniation, hydrocephalus, or extra-axial fluid collection. No focal mass effect or edema. Cisterns are patent. Cerebellar atrophy as well. Vascular: Intracranial atherosclerosis noted.  No hyperdense vessel. Skull: Intact skull. Anterior left frontal orbital scalp bruising/hematoma noted. Sinuses/Orbits: Sinuses remain clear. No other orbital abnormality. No proptosis. Other: None. IMPRESSION: Anterior frontal orbital soft tissue swelling/bruising. Atrophy and chronic white matter microvascular changes. No acute intracranial finding by noncontrast CT. Electronically Signed   By: Jerilynn Mages.  Shick M.D.   On: 08/11/2018 15:32   US Renal  Result Date: 08/15/2018 CLINICAL DATA:  Acute kidney injury. EXAM: RENAL / URINARY TRACT ULTRASOUND COMPLETE COMPARISON:  None. FINDINGS: Right Kidney: Length: 12.6 cm. Cortical thinning is noted. Echogenicity within normal limits. No mass or hydronephrosis visualized. Left Kidney: Length: 12.4 cm. Cortical thinning is noted. Echogenicity within normal limits. No mass or hydronephrosis  visualized. Bladder: Appears normal for degree of bladder distention. Ureteral jets are not visualized, but exam is somewhat limited due to body habitus. IMPRESSION: Mild bilateral renal cortical thinning is noted. No hydronephrosis or renal obstruction is noted. Electronically Signed   By: [name], M.D.   On: 08/15/2018 20:21   Dg Chest Port 1 View  Result Date: 08/15/2018 CLINICAL DATA:  dyspnea EXAM: PORTABLE CHEST 1 VIEW COMPARISON:  Radiograph 08/11/2018, CT 07/02/2018 FINDINGS: LEFT-sided pacemaker overlies stable cardiac silhouette. RIGHT perihilar masslike density corresponds to postradiation change on comparison CT. No pulmonary edema. No infiltrate. IMPRESSION: 1. No acute cardiopulmonary findings. 2. Stable postradiation change about the RIGHT hilum. Electronically Signed   By: Suzy Bouchard M.D.   On: 08/15/2018 10:23   Dg Chest Portable 1 View  Result Date: 08/11/2018 CLINICAL DATA:  84 year old who fell onto concrete earlier today sustaining lacerations to the head and LEFT hand. Current history of RIGHT UPPER LOBE lung cancer. EXAM: PORTABLE CHEST 1  VIEW COMPARISON:  CT chest 07/02/2018, 04/15/2018 and earlier. Chest x-rays 06/01/2018, 02/23/2018 and earlier. FINDINGS: Cardiac silhouette markedly enlarged, unchanged. LEFT subclavian biventricular pacemaker unchanged and appears intact. Thoracic aorta atherosclerotic, unchanged. RIGHT hilar/suprahilar mass, not significantly changed since the CT 07/02/2018. Post radiation fibrosis and bronchiectasis in the RIGHT UPPER LOBE which has shown further evolution since that CT. No new pulmonary parenchymal abnormalities in either lung. Emphysematous changes throughout both lungs as noted previously. IMPRESSION: 1.  No acute cardiopulmonary disease. 2. RIGHT hilar/suprahilar mass which is essentially stable since the CT 07/02/2018. 3. Evolving post radiation fibrosis involving the RIGHT UPPER LOBE. Aortic Atherosclerosis (ICD10-I70.0) and  Emphysema (ICD10-J43.9). Electronically Signed   By: Evangeline Dakin M.D.   On: 08/11/2018 18:22   Dg Hand Complete Left  Result Date: 08/11/2018 CLINICAL DATA:  Fall EXAM: LEFT HAND - COMPLETE 3+ VIEW COMPARISON:  None. FINDINGS: Osteopenia. There is deformity at the base of the proximal phalanx of the small finger. In impaction type fracture is not excluded. There is also deformity of the distal radius that is poorly visualized worrisome for a comminuted distal radius fracture. IMPRESSION: Possible fracture at the base of the proximal phalanx of the small finger. Dedicated small finger views may be helpful. Comminuted displaced fracture of the distal radius is suspected. A wrist radiograph series is recommended. Electronically Signed   By: Marybelle Killings M.D.   On: 08/11/2018 15:07   Dg Finger Little Left  Result Date: 08/11/2018 CLINICAL DATA:  Left fifth finger pain after fall. EXAM: LEFT LITTLE FINGER 2+V COMPARISON:  Radiographs of same day. FINDINGS: Probable nondisplaced fracture is seen involving the proximal base of the fifth proximal phalanx. Joint spaces are unremarkable. IMPRESSION: Probable nondisplaced fracture involving proximal base of fifth proximal phalanx. Electronically Signed   By: [name], M.D.   On: 08/11/2018 15:47     Subjective: He is breathing at baseline. He has some ronchus at baseline. Cough medicine helps the most  No BM yet, will get enema.   Discharge Exam: Vitals:   08/17/18 0900 08/17/18 0908  BP: (!) 162/82   Pulse: 64   Resp: 18   Temp:    SpO2: 94% 94%   Vitals:   08/17/18 0500 08/17/18 0812 08/17/18 0900 08/17/18 0908  BP: (!) 156/72 (!) 162/82 (!) 162/82   Pulse: 65 62 64   Resp:   18   Temp: 98.9 F (37.2 C) (!) 97.4 F (36.3 C)    TempSrc: Oral Oral    SpO2: 99% 94% 94% 94%  Weight: 96.2 kg     Height:        General: Pt is alert, awake, not in acute distress Cardiovascular: RRR, S1/S2 +, no rubs, no gallops Respiratory:  bilateral ronchus.  Abdominal: Soft, NT, ND, bowel sounds + Extremities: no edema, no cyanosis Right arm on splint    The results of significant diagnostics from this hospitalization (including imaging, microbiology, ancillary and laboratory) are listed below for reference.     Microbiology: Recent Results (from the past 240 hour(s))  Surgical pcr screen     Status: None   Collection Time: 08/13/18 11:06 AM  Result Value Ref Range Status   MRSA, PCR NEGATIVE NEGATIVE Final   Staphylococcus aureus NEGATIVE NEGATIVE Final    Comment: (NOTE) The Xpert SA Assay (FDA approved for NASAL specimens in patients 59 years of age and older), is one component of a comprehensive surveillance program. It is not intended to diagnose infection nor  to guide or monitor treatment. Performed at [facility], [place] [address] [place], [place] [address]      Labs: BNP (last 3 results) Recent Labs    08/11/18 1611 08/15/18 0847  BNP 369.4* 638.9*   Basic Metabolic Panel: Recent Labs  Lab 08/12/18 0531 08/13/18 0309 08/14/18 0516 08/15/18 0250 08/16/18 0303 08/17/18 0335  NA 139 136 135 135 134* 135  K 3.9 3.2* 3.8 3.7 3.7 3.7  CL 105 99 99 98 101 101  CO2 25 27 26 27 25 27   GLUCOSE 127* 132* 151* 84 109* 166*  BUN 23 28* 36* 40* 38* 40*  CREATININE 2.71* 2.92* 2.95* 3.31* 2.84* 2.57*  CALCIUM 8.4* 8.6* 8.2* 8.2* 8.1* 8.4*  MG 1.5* 2.0  --   --   --   --   PHOS 3.8  --   --   --   --   --    Liver Function Tests: Recent Labs  Lab 08/12/18 0531  AST 16  ALT 10  ALKPHOS 52  BILITOT 0.7  PROT 6.0*  ALBUMIN 3.1*   No results for input(s): LIPASE, AMYLASE in the last 168 hours. No results for input(s): AMMONIA in the last 168 hours. CBC: Recent Labs  Lab 08/11/18 1611 08/12/18 0531 08/13/18 0309 08/14/18 0516 08/15/18 0250 08/16/18 1057 08/17/18 0335  WBC 8.5 7.8 9.5 9.0 7.5  --  6.4  NEUTROABS 7.1  --   --   --   --   --   --   HGB 9.6* 9.3* 9.1* 8.6* 8.1* 8.6*  8.4*  HCT 30.8* 29.7* 28.1* 27.1* 25.2* 26.9* 26.1*  MCV 88.8 88.7 85.4 86.9 86.0  --  85.9  PLT 245 195 208 203 191  --  223   Cardiac Enzymes: Recent Labs  Lab 08/11/18 2353 08/15/18 1245  CKTOTAL  --  74  TROPONINI 0.05*  --    BNP: Invalid input(s): POCBNP CBG: Recent Labs  Lab 08/16/18 0755 08/16/18 1118 08/16/18 1624 08/16/18 2139 08/17/18 0613  GLUCAP 143* 231* 194* 221* 179*   D-Dimer No results for input(s): DDIMER in the last 72 hours. Hgb A1c No results for input(s): HGBA1C in the last 72 hours. Lipid Profile No results for input(s): CHOL, HDL, LDLCALC, TRIG, CHOLHDL, LDLDIRECT in the last 72 hours. Thyroid function studies No results for input(s): TSH, T4TOTAL, T3FREE, THYROIDAB in the last 72 hours.  Invalid input(s): FREET3 Anemia work up Recent Labs    08/15/18 0250  VITAMINB12 174*  FERRITIN 30  TIBC 279  IRON 23*  RETICCTPCT 1.8   Urinalysis    Component Value Date/Time   COLORURINE YELLOW 08/15/2018 1004   APPEARANCEUR CLEAR 08/15/2018 1004   APPEARANCEUR Clear 07/13/2018 1526   LABSPEC 1.011 08/15/2018 1004   PHURINE 5.0 08/15/2018 1004   GLUCOSEU 50 (A) 08/15/2018 1004   HGBUR MODERATE (A) 08/15/2018 1004   BILIRUBINUR NEGATIVE 08/15/2018 1004   BILIRUBINUR Negative 07/13/2018 1526   KETONESUR 5 (A) 08/15/2018 1004   PROTEINUR 100 (A) 08/15/2018 1004   NITRITE NEGATIVE 08/15/2018 1004   LEUKOCYTESUR NEGATIVE 08/15/2018 1004   LEUKOCYTESUR Negative 07/13/2018 1526   Sepsis Labs Invalid input(s): PROCALCITONIN,  WBC,  LACTICIDVEN Microbiology Recent Results (from the past 240 hour(s))  Surgical pcr screen     Status: None   Collection Time: 08/13/18 11:06 AM  Result Value Ref Range Status   MRSA, PCR NEGATIVE NEGATIVE Final   Staphylococcus aureus NEGATIVE NEGATIVE Final    Comment: (  NOTE) The Xpert SA Assay (FDA approved for NASAL specimens in patients 45 years of age and older), is one component of a  comprehensive surveillance program. It is not intended to diagnose infection nor to guide or monitor treatment. Performed at [facility], [place] [address] [place], [place] [address]      Time coordinating discharge: 35 minutes.   SIGNED:   Elmarie Shiley, MD  [facility] 08/17/2018, 9:43 AM Pager [phone]  If 7PM-7AM, please contact night-coverage [email or web address] Password [email or web address]

## 2018-08-18 DIAGNOSIS — S52592D Other fractures of lower end of left radius, subsequent encounter for closed fracture with routine healing: Secondary | ICD-10-CM | POA: Diagnosis not present

## 2018-08-18 DIAGNOSIS — J439 Emphysema, unspecified: Secondary | ICD-10-CM | POA: Diagnosis not present

## 2018-08-18 DIAGNOSIS — I480 Paroxysmal atrial fibrillation: Secondary | ICD-10-CM | POA: Diagnosis not present

## 2018-08-18 DIAGNOSIS — D860 Sarcoidosis of lung: Secondary | ICD-10-CM | POA: Diagnosis not present

## 2018-08-20 ENCOUNTER — Telehealth: Payer: Self-pay

## 2018-08-20 NOTE — Telephone Encounter (Signed)
Referred to [facility] clinic by Dr Sallyanne Kuster.  Attempted call to patient for ICM intro and daughter in law answered phone.  Patient currently in rehab center recovering from broken arm.  Patient's wife has dementia and requires full time care.  Patient may be discharged later this week.  Advised I would call back at a later date.

## 2018-08-22 DIAGNOSIS — Z95810 Presence of automatic (implantable) cardiac defibrillator: Secondary | ICD-10-CM | POA: Diagnosis not present

## 2018-08-22 DIAGNOSIS — I480 Paroxysmal atrial fibrillation: Secondary | ICD-10-CM | POA: Diagnosis not present

## 2018-08-22 DIAGNOSIS — S0181XD Laceration without foreign body of other part of head, subsequent encounter: Secondary | ICD-10-CM | POA: Diagnosis not present

## 2018-08-22 DIAGNOSIS — J439 Emphysema, unspecified: Secondary | ICD-10-CM | POA: Diagnosis not present

## 2018-08-25 ENCOUNTER — Other Ambulatory Visit: Payer: Self-pay

## 2018-08-25 NOTE — Patient Outreach (Signed)
[facility]) Care Management  08/25/2018  Ruben Conner, DDS 12/16/1929 [id number]     Transition of Care Referral  Referral Date: 08/25/18 Referral Source: [facility]TA Discharge Report Date of Admission: 08/17/18 Diagnosis: fall, ORIF Date of Discharge: 08/23/18 Facility: [facility] Insurance: HTA    Outreach attempt # 1 to patient. Spoke with patient. He voices that he is doing well and getting along okay as it has only been two days since he came home. 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 He states that he has a paid caregiver that comes in the home to assist him and spouse and she will be there shortly. Patient confirmed that he has his MD follow up appts in place. He voices that his son and daughter in law live about 1.5hrs away but will be able to help him to appts. Patient declined needing SW assistance for transportation resources. He voices that he will also call MD to see if it is okay for him to resume driving. Patient confirmed that he has all his meds. He denies any issues or concerns regarding meds. He did not wish to complete med review. Patient is a retired Tax adviser and voices he knows how to manage his meds on his own. He denies any RN CM or [facility]N needs or concerns at this time. He was appreciative of follow up call but declined services.     Plan: RN CM will close case at this time.    Enzo Montgomery, RN,BSN,CCM [facility] Telephonic Care Management Coordinator Direct Phone: [phone] Toll Free: [phone] Fax: [phone]

## 2018-08-26 DIAGNOSIS — Z7952 Long term (current) use of systemic steroids: Secondary | ICD-10-CM | POA: Diagnosis not present

## 2018-08-26 DIAGNOSIS — N184 Chronic kidney disease, stage 4 (severe): Secondary | ICD-10-CM | POA: Diagnosis not present

## 2018-08-26 DIAGNOSIS — I129 Hypertensive chronic kidney disease with stage 1 through stage 4 chronic kidney disease, or unspecified chronic kidney disease: Secondary | ICD-10-CM | POA: Diagnosis not present

## 2018-08-26 DIAGNOSIS — D860 Sarcoidosis of lung: Secondary | ICD-10-CM | POA: Diagnosis not present

## 2018-08-26 DIAGNOSIS — S52592D Other fractures of lower end of left radius, subsequent encounter for closed fracture with routine healing: Secondary | ICD-10-CM | POA: Diagnosis not present

## 2018-08-26 DIAGNOSIS — I5023 Acute on chronic systolic (congestive) heart failure: Secondary | ICD-10-CM | POA: Diagnosis not present

## 2018-08-26 DIAGNOSIS — Z7901 Long term (current) use of anticoagulants: Secondary | ICD-10-CM | POA: Diagnosis not present

## 2018-08-26 DIAGNOSIS — E1122 Type 2 diabetes mellitus with diabetic chronic kidney disease: Secondary | ICD-10-CM | POA: Diagnosis not present

## 2018-08-26 DIAGNOSIS — D631 Anemia in chronic kidney disease: Secondary | ICD-10-CM | POA: Diagnosis not present

## 2018-08-26 DIAGNOSIS — E7800 Pure hypercholesterolemia, unspecified: Secondary | ICD-10-CM | POA: Diagnosis not present

## 2018-08-26 DIAGNOSIS — E785 Hyperlipidemia, unspecified: Secondary | ICD-10-CM | POA: Diagnosis not present

## 2018-08-26 DIAGNOSIS — W19XXXD Unspecified fall, subsequent encounter: Secondary | ICD-10-CM | POA: Diagnosis not present

## 2018-08-26 DIAGNOSIS — I480 Paroxysmal atrial fibrillation: Secondary | ICD-10-CM | POA: Diagnosis not present

## 2018-08-26 DIAGNOSIS — Z95810 Presence of automatic (implantable) cardiac defibrillator: Secondary | ICD-10-CM | POA: Diagnosis not present

## 2018-08-26 DIAGNOSIS — E559 Vitamin D deficiency, unspecified: Secondary | ICD-10-CM | POA: Diagnosis not present

## 2018-08-26 IMAGING — PT NM PET TUM IMG INITIAL (PI) SKULL BASE T - THIGH
1 of 9 series · 1 of 25 positions shown · non-contrast
Comparison: Chest CT 02/26/2017

CLINICAL DATA: Initial treatment strategy for right-sided lung
mass..

EXAM:
NUCLEAR MEDICINE PET SKULL BASE TO THIGH
TECHNIQUE: 10.0 mCi F-18 FDG was injected intravenously. Full-ring PET imaging
was performed from the skull base to thigh after the radiotracer. CT
data was obtained and used for attenuation correction and anatomic
localization.
FASTING BLOOD GLUCOSE:  Value: 107 mg/dl

[Series 4: ct sk_thigh 5.0 b31f · axial · 5.0mm · 0.98mm/px · 1 of 248 slices shown]
[im 248/248  brain]
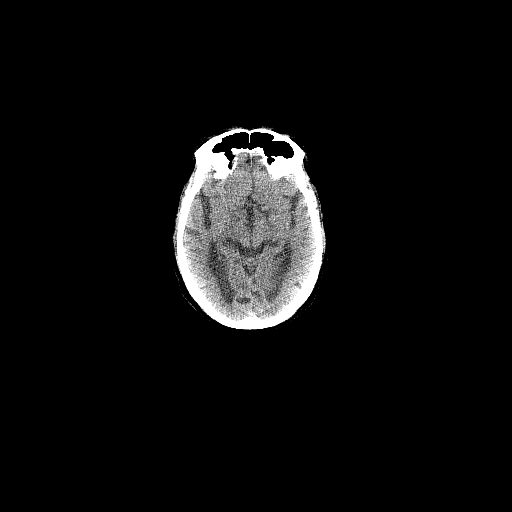

[1 of 25 positions shown; findings below may reference images not displayed]

FINDINGS: NECK

Deep left parotid hypermetabolic nodule measures 6 mm and a S.U.V.
max of 7.2 on image 19/series 4.

No cervical nodal hypermetabolism.  No cervical adenopathy.

CHEST

Right suprahilar/ upper lobe lung mass with direct extension to or
contiguous adenopathy within the right hilum. Involvement of the
right-sided mediastinum. This measures 5.6 x 5.0 cm and a S.U.V. max
of 8.2 on image 69/series 4.

Prevascular node measures 10 mm and a S.U.V. max of 3.9 on image
65/series 4.

Hypermetabolic middle mediastinal nodes, including a precarinal node
which measures 1.3 cm and a S.U.V. max of 6.2 on image 73/series 4.

Mild cardiomegaly with coronary artery atherosclerosis.
Centrilobular emphysema. Other chest findings deferred to prior
diagnostic CT.

ABDOMEN/PELVIS

No abdominopelvic nodal or parenchymal hypermetabolism. Mild renal
cortical thinning bilaterally. Normal adrenal glands. Multiple small
gallstones, without acute cholecystitis. Aortic and branch vessel
atherosclerosis. Moderate prostatomegaly. Left larger than right fat
containing inguinal hernias. Large left and moderate right-sided
hydrocele.

SKELETON

No abnormal marrow activity. No focal osseous lesion. Degenerative
partial fusion of the bilateral sacroiliac joints. Lumbar facet
arthropathy.
IMPRESSION: 1. Primary bronchogenic neoplasm with epicenter in the central right
upper lobe. Direct extension to versus contiguous adenopathy within
the right hilum. Direct extension into the right side of the
mediastinum.
2. Thoracic nodal metastasis.
3. Hypermetabolic left parotid nodule is suspicious for a benign or
malignant primary neoplasm.
4.  Coronary artery atherosclerosis. Aortic atherosclerosis.
5. Cholelithiasis.

## 2018-08-27 DIAGNOSIS — S52532D Colles' fracture of left radius, subsequent encounter for closed fracture with routine healing: Secondary | ICD-10-CM | POA: Diagnosis not present

## 2018-08-27 DIAGNOSIS — Z5189 Encounter for other specified aftercare: Secondary | ICD-10-CM | POA: Diagnosis not present

## 2018-08-29 NOTE — Progress Notes (Signed)
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 Sallyanne Kuster Chief Complaint  Patient presents with  . Hospitalization Follow-up  . Congestive Heart Failure  . 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  The patient was seen on consultation by cardiology during recent hospitalization after a fall complicated by left wrist fracture.  The patient's diuretic was held temporarily due to elevated creatinine and euvolemic status. Echo was completed on 08/03/2018 revealing continued systolic dysfunction, EF of 25%-30%.  Prior to discharge his home dose of diuretic was resumed.  He was to follow-up with EP for ongoing interrogation of ICD.  The patient has home physical therapy, and Occupational Therapy.  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 He has just come from orthopedics and has had his cast changed. He has some dependent edema but is doing his best to keep legs and left arm elevated.  He is to see Dr.Taylor for PPM interrogation due to some diaphragmatic stimulation noted during his recent hospitalization. It finds it to be aggravating but he is putting up with it until follow appointment.   Past Medical History:  Diagnosis Date  . Arthritis    "knees, probably in my back" (08/12/2018)  . Cancer ([place])    lung  . CHF (congestive heart failure) ([place])   . CKD (chronic kidney disease), stage IV ([place])   . Fall 08/11/2018   "going into dr's appointment" (08/12/2018)  . Hyperlipidemia   . Hypertension   . Irregular heart rate    Dr. Victorino December note from 04/2017  states bradycardia, LBBB  . Pneumonia    "probably 3 times in my life; at the most" (08/12/2018)  . Presence of permanent cardiac pacemaker   . Renal insufficiency   . Sarcoidosis, lung ([place])   . Type II diabetes mellitus (Ruben Conner)     Past Surgical History:  Procedure Laterality Date  . BIV PACEMAKER INSERTION CRT-P N/A 05/31/2018   Procedure: BIV PACEMAKER INSERTION CRT-P;  Surgeon: Evans Lance, MD;  Location: [facility];  Service: Cardiovascular;  Laterality: N/A;  . CATARACT EXTRACTION W/ INTRAOCULAR LENS  IMPLANT, BILATERAL Bilateral   . ENDOBRONCHIAL ULTRASOUND Bilateral 03/23/2017   Procedure: ENDOBRONCHIAL ULTRASOUND;  Surgeon: Rigoberto Noel, MD;  Location: [facility];  Service: Cardiopulmonary;  Laterality: Bilateral;  . INSERT / REPLACE / REMOVE PACEMAKER    . LUNG BIOPSY  X 5   "thought it was lung cancer; ended up being sarcoidosis"  . MEDIASTINOSCOPY N/A 04/08/2017   Procedure: MEDIASTINOSCOPY;  Surgeon: Grace Isaac, MD;  Location: [facility];  Service: Thoracic;  Laterality: N/A;  . MOUTH SURGERY  "several"   "to fix bone so bridgework fit better"  . 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 ORIF WRIST FRACTURE Left 08/13/2018   Procedure: OPEN REDUCTION INTERNAL FIXATION (ORIF) WRIST FRACTURE;  Surgeon: Iran Planas, MD;  Location: [facility];  Service: Orthopedics;  Laterality: Left;  . PILONIDAL CYST EXCISION  1958  . SHOULDER OPEN ROTATOR CUFF  REPAIR Left   . TEE WITHOUT CARDIOVERSION N/A 05/28/2018   Procedure: TRANSESOPHAGEAL ECHOCARDIOGRAM (TEE);  Surgeon: Sanda Klein, MD;  Location: [facility];  Service: Cardiovascular;  Laterality: N/A;  . TUMOR EXCISION     "[place] tumor removed off neck under my left ear"  . VIDEO BRONCHOSCOPY WITH ENDOBRONCHIAL ULTRASOUND N/A 04/08/2017   Procedure: VIDEO BRONCHOSCOPY WITH ENDOBRONCHIAL ULTRASOUND;  Surgeon: Grace Isaac, MD;   Location: [facility];  Service: Thoracic;  Laterality: N/A;  . VIDEO BRONCHOSCOPY WITH ENDOBRONCHIAL ULTRASOUND N/A 12/08/2017   Procedure: VIDEO BRONCHOSCOPY with BRONCHIAL BIOPSIES AND ENDOBRONCHIAL ULTRASOUND WITH TRANSBRONCHIAL AND BRONCHIAL BIOPSIES;  Surgeon: Grace Isaac, MD;  Location: [facility];  Service: Thoracic;  Laterality: N/A;     Current Outpatient Medications  Medication Sig Dispense Refill  . acetaminophen (TYLENOL) 500 MG tablet Take 1,000 mg by mouth every 6 (six) hours as needed for moderate pain or headache.    Marland Kitchen amiodarone (PACERONE) 200 MG tablet Take 1 tablet (200 mg total) by mouth daily. 30 tablet 3  . apixaban (ELIQUIS) 2.5 MG TABS tablet Take 1 tablet (2.5 mg total) by mouth 2 (two) times daily. 60 tablet 2  . carvedilol (COREG) 3.125 MG tablet Take 1 tablet (3.125 mg total) by mouth 2 (two) times daily. 60 tablet 11  . Cholecalciferol (VITAMIN D3) 5000 units CAPS Take 5,000 Units by mouth every evening.     Marland Kitchen dextromethorphan (DELSYM) 30 MG/5ML liquid Take 2.5 mLs (15 mg total) by mouth 2 (two) times daily as needed for cough. 89 mL 0  . glimepiride (AMARYL) 4 MG tablet TAKE 1 TABLET DAILY (Patient taking differently: Take 4 mg by mouth daily with breakfast. ) 30 tablet 3  . glucose blood (ONETOUCH VERIO) test strip Twice Daily 100 each 12  . guaiFENesin (MUCINEX) 600 MG 12 hr tablet Take 1 tablet (600 mg total) by mouth 2 (two) times daily. 60 tablet 0  . polyethylene glycol (MIRALAX / GLYCOLAX) packet Take 17 g by mouth 2 (two) times daily. 14 each 0  . predniSONE (DELTASONE) 10 MG tablet Take 0.5 tablets (5 mg total) by mouth daily. 30 tablet 0  . senna (SENOKOT) 8.6 MG TABS tablet Take 1 tablet (8.6 mg total) by mouth 2 (two) times daily. 120 each 0  . torsemide (DEMADEX) 20 MG tablet Take 3 tablets (60 mg total) by mouth daily. 30 tablet 0  . vitamin B-12 100 MCG tablet Take 1 tablet (100 mcg total) by mouth daily. 30 tablet 0   No current facility-administered  medications for this visit.     Allergies:   Patient has no known allergies.    Social History:  The patient  reports that he quit smoking about 21 months ago. His smoking use included cigarettes. He has a 5.30 pack-year smoking history. He has never used smokeless tobacco. He reports that he drank alcohol. He reports that he does not use drugs.   Family History:  The patient's family history includes Cancer in his son; Heart disease in his father; Kidney disease in his mother; Suicidality in his son.    ROS: All other systems are reviewed and negative. Unless otherwise mentioned in H&P    PHYSICAL EXAM: VS:  BP (!) 114/50   Pulse 67   Ht 6' (1.829 m)   Wt 211 lb (95.7 kg)   SpO2 95%   BMI 28.62 kg/m  , BMI Body mass index is 28.62 kg/m. GEN: Well nourished, well developed, in no acute  distress HEENT: normal. Left eyebrow with crusting from laceration, no edema.  Neck: no JVD, carotid bruits, or masses Cardiac: RRR; no murmurs, rubs, or gallops,no edema  Respiratory: Some bilateral crackles without wheezing or rales. Coughing does clear some of it,  GI: soft, nontender, nondistended, + BS. Obese MS: no deformity or atrophy. Left arm in a cast with edema of the fingers and hand. Incision between the 4th and 5th metatarsal is healing well,  as seen on exposed areas distal to the cast. He has 1+-2+ pitting edema above his sock line. Appears to be dependent edema.  Skin: warm and dry, no rash Neuro:  Strength and sensation are intact Psych: euthymic mood, full affect   EKG:  Not completed this office visit.   Recent Labs: 08/12/2018: ALT 10; TSH 2.331 Sep 08, 2018: Magnesium 2.0 08/15/2018: B Natriuretic Peptide 323.5 08/17/2018: BUN 40; Creatinine, Ser 2.57; Hemoglobin 8.4; Platelets 223; Potassium 3.7; Sodium 135    Lipid Panel    Component Value Date/Time   CHOL 143 06/21/2018 1100   TRIG 100 06/21/2018 1100   TRIG 108 07/06/2014 0936   HDL 44 06/21/2018 1100   HDL 35 (L)  07/06/2014 0936   CHOLHDL 3.3 06/21/2018 1100   LDLCALC 79 06/21/2018 1100   LDLCALC 83 07/06/2014 0936      Wt Readings from Last 3 Encounters:  08/30/18 211 lb (95.7 kg)  08/17/18 212 lb 1.6 oz (96.2 kg)  08/09/18 215 lb 9.6 oz (97.8 kg)      Other studies Reviewed: Echocardiogram 08-Sep-2018 Left ventricle: The cavity size was normal. Wall thickness was   increased in a pattern of moderate LVH. Systolic function was   severely reduced. The estimated ejection fraction was in the   range of 25% to 30%. Diffuse hypokinesis. Doppler parameters are   consistent with abnormal left ventricular relaxation (grade 1   diastolic dysfunction). - Aortic root: The aortic root was mildly dilated.  Impressions:  - Severe global reduction in LV systolic function; moderate LVH;   mild diastolic dysfunction; mildly dilated aortic root.  ASSESSMENT AND PLAN:  1.  NICM: EF of 25%. He does not appear to be volume overloaded but is having some dependent edema. He is not very active due to bilateral knee degeneration. He is feeling better, healing well from fall. He has gotten a new recliner to keep his feet elevated. I see some dependent edema in the lower extremities. He has also been seen on courtesy by Dr, Sallyanne Kuster. BMET and Mg are being ordered.   2. Chronic Systolic CHF: He has some dependent edema but does not appear to have significant fluid overload. He remains on torsemide 60 mg TID. No complaints of cramping or dizziness. Checking labs. Continue daily weights. They are 4 lbs lower than than previous weight. He is doing his best to avoid salt. I have also suggested support hose to help with LEE.   3. Atrial fib: Heart rate is currently regular. He remains on Eliquis, amiodarone and carvedilol.  No complaints of bleeding. Monitor closely with lung diease concerning amiodarone.  Will need follow up TSH and Liver enzymes on next appointment.   4.  Left wrist fracture: ORIF of left wrist with  intra articular distal radius fracture and repair of complex laceration, with left wrist brachial radialis tendon release and tendon tenotomy on Sep 08, 2018 by Dr. Apolonio Schneiders. Followed by him as OP.     Current medicines are reviewed at length with the patient today.    Labs/  tests ordered today include: BMET, Mg.   Phill Myron. West Pugh, ANP, AACC   08/30/2018 3:17 PM    [facility] [address] Office [phone] Fax [phone]

## 2018-08-30 ENCOUNTER — Encounter: Payer: Self-pay | Admitting: Adult Health

## 2018-08-30 ENCOUNTER — Ambulatory Visit (INDEPENDENT_AMBULATORY_CARE_PROVIDER_SITE_OTHER): Payer: PPO | Admitting: Adult Health

## 2018-08-30 VITALS — BP 114/50 | HR 67 | Ht 72.0 in | Wt 211.0 lb

## 2018-08-30 DIAGNOSIS — I5042 Chronic combined systolic (congestive) and diastolic (congestive) heart failure: Secondary | ICD-10-CM | POA: Diagnosis not present

## 2018-08-30 DIAGNOSIS — I480 Paroxysmal atrial fibrillation: Secondary | ICD-10-CM

## 2018-08-30 DIAGNOSIS — E7800 Pure hypercholesterolemia, unspecified: Secondary | ICD-10-CM

## 2018-08-30 DIAGNOSIS — I10 Essential (primary) hypertension: Secondary | ICD-10-CM | POA: Diagnosis not present

## 2018-08-30 DIAGNOSIS — Z79899 Other long term (current) drug therapy: Secondary | ICD-10-CM | POA: Diagnosis not present

## 2018-08-30 DIAGNOSIS — I429 Cardiomyopathy, unspecified: Secondary | ICD-10-CM | POA: Diagnosis not present

## 2018-08-30 NOTE — Patient Instructions (Addendum)
Follow-Up: You will need a follow up appointment in [place].   You may see  DR Sallyanne Kuster AND Lovena Le   Medication Instructions:  NO CHANGES- Your physician recommends that you continue on your current medications as directed. Please refer to the Current Medication list given to you today.  If you need a refill on your cardiac medications before your next appointment, please call your pharmacy.  Labwork: BMET AND MAG PER DR CROITORU If you have labs (blood work) drawn today and your tests are completely normal, you will receive your results only by: Marland Kitchen MyChart Message (if you have MyChart) OR . A paper copy in the mail If you have any lab test that is abnormal or we need to change your treatment, we will call you to review the results.  At [facility], you and your health needs are our priority.  As part of our continuing mission to provide you with exceptional heart care, we have created designated Provider Care Teams.  These Care Teams include your primary Cardiologist (physician) and Advanced Practice Providers (APPs -  Physician Assistants and Nurse Practitioners) who all work together to provide you with the care you need, when you need it.  Thank you for choosing [facility] at [facility]!!

## 2018-08-30 NOTE — Progress Notes (Signed)
Thanks, I added a BMEt and Mg since Nephrology appt is not until 10/31 MCr

## 2018-08-30 NOTE — Telephone Encounter (Signed)
Attempted call to patient and left message with ICM direct number for call back.

## 2018-08-31 LAB — MAGNESIUM: Magnesium: 1.8 mg/dL (ref 1.6–2.3)

## 2018-08-31 LAB — BASIC METABOLIC PANEL
BUN/Creatinine Ratio: 14 (ref 10–24)
BUN: 40 mg/dL — AB (ref 8–27)
CALCIUM: 8.6 mg/dL (ref 8.6–10.2)
CO2: 24 mmol/L (ref 20–29)
CREATININE: 2.8 mg/dL — AB (ref 0.76–1.27)
Chloride: 99 mmol/L (ref 96–106)
GFR calc Af Amer: 23 mL/min/{1.73_m2} — ABNORMAL LOW (ref >59)
GFR calc non Af Amer: 20 mL/min/{1.73_m2} — ABNORMAL LOW (ref >59)
Glucose: 133 mg/dL — ABNORMAL HIGH (ref 65–99)
POTASSIUM: 3.9 mmol/L (ref 3.5–5.2)
SODIUM: 140 mmol/L (ref 134–144)

## 2018-08-31 NOTE — Telephone Encounter (Signed)
Thank you for the information.

## 2018-08-31 NOTE — Telephone Encounter (Signed)
Spoke with patient and provided ICM intro and he agreed to monthly ICM follow up.  He has monitor at bedside.  He recently returned home after hospitalization/rehab for broken arm related to a fall.  He report feet are swollen making it difficult to wear shoes and was noted at office visit with Jory Sims, NP on 08/30/2018. He is wearing compression stockings to help with swelling.  Labs drawn at the office visit.   Will schedule first ICM remote transmission for 09/09/2018.   Will copy Dr Sallyanne Kuster on call and remote transmission.  Remote Transmission from 08/30/2018

## 2018-09-02 ENCOUNTER — Encounter: Payer: Self-pay | Admitting: Family Medicine

## 2018-09-02 ENCOUNTER — Ambulatory Visit (INDEPENDENT_AMBULATORY_CARE_PROVIDER_SITE_OTHER): Payer: PPO | Admitting: Family Medicine

## 2018-09-02 VITALS — BP 136/68 | HR 68 | Temp 97.4°F | Ht 72.0 in | Wt 210.0 lb

## 2018-09-02 DIAGNOSIS — Z23 Encounter for immunization: Secondary | ICD-10-CM | POA: Diagnosis not present

## 2018-09-02 DIAGNOSIS — R531 Weakness: Secondary | ICD-10-CM

## 2018-09-02 DIAGNOSIS — R710 Precipitous drop in hematocrit: Secondary | ICD-10-CM

## 2018-09-02 DIAGNOSIS — I739 Peripheral vascular disease, unspecified: Secondary | ICD-10-CM

## 2018-09-02 DIAGNOSIS — N183 Chronic kidney disease, stage 3 (moderate): Secondary | ICD-10-CM | POA: Diagnosis not present

## 2018-09-02 DIAGNOSIS — N184 Chronic kidney disease, stage 4 (severe): Secondary | ICD-10-CM | POA: Diagnosis not present

## 2018-09-02 DIAGNOSIS — E1122 Type 2 diabetes mellitus with diabetic chronic kidney disease: Secondary | ICD-10-CM

## 2018-09-02 DIAGNOSIS — D631 Anemia in chronic kidney disease: Secondary | ICD-10-CM | POA: Diagnosis not present

## 2018-09-02 NOTE — Patient Instructions (Signed)
Continue to drink plenty of fluids Work with occupational health as they come in to help you with your arm Follow-up with orthopedic surgeon as planned Follow-up with nephrologist as planned, Dr. Joelyn Oms Follow-up with cardiology as planned We will call with lab work results and make sure that the other specialist involved in your care get a copy of these lab results

## 2018-09-02 NOTE — Progress Notes (Signed)
Subjective:    Patient ID: Ruben Conner, DDS, male    DOB: 10/13/1930, 84 y.o.   MRN: [id number]  HPI Patient here today for hospital and rehabilitation follow up. He had a fall and was sent to [facility] where he was admitted for left hip and left arm. He was admitted from 08/11/18 to 08/17/18. He then went to [facility] from 08/17/18 to 08/24/18.  The patient unfortunately had a fall and had a severe fracture of the left wrist and had to be hospitalized for several days and ended up in the nursing home for rehab and he is now back at home.  He was recently discharged from [place] and he was there for 1 week.  Patient complains of weakness from his hospitalization and stay at the nursing home.  He is seen the cardiologist recently.  He has plans to see his nephrologist next week along with seeing Dr. Apolonio Schneiders the orthopedist also.  He has someone helping him at home and has rehab and occupational health coming in to work with him soon.  He is also requesting that we get some help for his wife who is become more bed confined since he has been out of the picture and who is also had some pneumonia and is hopefully doing better.  Patient today denies any chest pain or shortness of breath anymore than usual.  He says his bowels are moving well with no blood in the stool he is passing his water without problems.   Patient Active Problem List   Diagnosis Date Noted  . CHF exacerbation ([place]) 08/11/2018  . Left wrist fracture, closed, initial encounter 08/11/2018  . Degloving injury of left hand 08/11/2018  . Paroxysmal atrial fibrillation ([place]) 08/10/2018  . Cardiomyopathy ([place]) 08/10/2018  . Biventricular automatic implantable cardioverter defibrillator in situ 08/10/2018  . Chronic combined systolic and diastolic heart failure ([place]) 05/31/2018  . Acute systolic heart failure ([place]) 05/24/2018  . Persistent atrial fibrillation 05/24/2018  . Sarcoidosis 04/06/2018  . CKD (chronic kidney disease) stage 4,  GFR 15-29 ml/min (HCC) 04/06/2018  . Edema 02/23/2018  . Mediastinal lymphadenopathy 03/10/2017  . Anemia in stage 4 chronic kidney disease ([place]) 09/18/2016  . Vitamin D deficiency 09/18/2016  . Sinus bradycardia 08/31/2014  . First degree AV block 08/31/2014  . LBBB (left bundle branch block) 08/31/2014  . Essential hypertension 09/20/2013  . Type 2 diabetes mellitus with stage 3 chronic kidney disease, without long-term current use of insulin ([place]) 05/02/2013  . Hypercholesterolemia 05/02/2013   Outpatient Encounter Medications as of 09/02/2018  Medication Sig  . acetaminophen (TYLENOL) 500 MG tablet Take 1,000 mg by mouth every 6 (six) hours as needed for moderate pain or headache.  Marland Kitchen amiodarone (PACERONE) 200 MG tablet Take 1 tablet (200 mg total) by mouth daily.  Marland Kitchen apixaban (ELIQUIS) 2.5 MG TABS tablet Take 1 tablet (2.5 mg total) by mouth 2 (two) times daily.  . carvedilol (COREG) 3.125 MG tablet Take 1 tablet (3.125 mg total) by mouth 2 (two) times daily.  . Cholecalciferol (VITAMIN D3) 5000 units CAPS Take 5,000 Units by mouth every evening.   Marland Kitchen dextromethorphan (DELSYM) 30 MG/5ML liquid Take 2.5 mLs (15 mg total) by mouth 2 (two) times daily as needed for cough.  Marland Kitchen glimepiride (AMARYL) 4 MG tablet TAKE 1 TABLET DAILY (Patient taking differently: Take 4 mg by mouth daily with breakfast. )  . glucose blood (ONETOUCH VERIO) test strip Twice Daily  . guaiFENesin (MUCINEX) 600 MG  12 hr tablet Take 1 tablet (600 mg total) by mouth 2 (two) times daily.  . polyethylene glycol (MIRALAX / GLYCOLAX) packet Take 17 g by mouth 2 (two) times daily.  . predniSONE (DELTASONE) 10 MG tablet Take 0.5 tablets (5 mg total) by mouth daily.  Marland Kitchen senna (SENOKOT) 8.6 MG TABS tablet Take 1 tablet (8.6 mg total) by mouth 2 (two) times daily.  Marland Kitchen torsemide (DEMADEX) 20 MG tablet Take 3 tablets (60 mg total) by mouth daily.  . vitamin B-12 100 MCG tablet Take 1 tablet (100 mcg total) by mouth daily.   No  facility-administered encounter medications on file as of 09/02/2018.       Review of Systems  Constitutional: Negative.   HENT: Negative.   Eyes: Negative.   Respiratory: Negative.   Cardiovascular: Positive for leg swelling (getting better).  Gastrointestinal: Negative.   Endocrine: Negative.   Genitourinary: Negative.   Musculoskeletal: Positive for arthralgias (left arm in cast and sling).  Skin: Negative.   Allergic/Immunologic: Negative.   Neurological: Negative.   Hematological: Negative.   Psychiatric/Behavioral: Negative.        Objective:   Physical Exam  Constitutional: He is oriented to person, place, and time. He appears well-developed. No distress.  Patient is pleasant and alert but looks fatigued and his color is pale.  HENT:  Head: Normocephalic and atraumatic.  Right Ear: External ear normal.  Left Ear: External ear normal.  Nose: Nose normal.  Mouth/Throat: Oropharynx is clear and moist. No oropharyngeal exudate.  Eyes: Pupils are equal, round, and reactive to light. Conjunctivae and EOM are normal. Right eye exhibits no discharge. Left eye exhibits no discharge. No scleral icterus.  Neck: Normal range of motion. Neck supple. No thyromegaly present.  Cardiovascular: Normal rate and normal heart sounds.  No murmur heard. Heart is irregular irregular at 84/min with 2+ pretibial edema  Pulmonary/Chest: Effort normal and breath sounds normal. He has no wheezes. He has no rales.  Minimal congestion with deep breathing  Abdominal: Soft. Bowel sounds are normal. He exhibits no mass. There is no tenderness.  No abdominal tenderness or masses  Musculoskeletal: He exhibits deformity. He exhibits no edema or tenderness.  Left arm is in a cast and he has edema in both legs  Lymphadenopathy:    He has no cervical adenopathy.  Neurological: He is alert and oriented to person, place, and time. He has normal reflexes.  Skin: Skin is warm and dry. No rash noted.    Psychiatric: He has a normal mood and affect. His behavior is normal. Judgment and thought content normal.  Mood affect and behavior are normal for this patient and may be slightly depressed because of worrying about his wife who has ligated herself to staying in bed more.  Nursing note and vitals reviewed.   BP 136/68 (BP Location: Right Arm)   Pulse 68   Temp (!) 97.4 F (36.3 C) (Oral)   Ht 6' (1.829 m)   Wt 210 lb (95.3 kg)   BMI 28.48 kg/m        Assessment & Plan:  1. Encounter for immunization - Flu vaccine HIGH DOSE PF  2. Weakness -Begin physical therapy as per order of Dr. Apolonio Schneiders, the orthopedist. - CMP14+EGFR - CBC with Differential/Platelet  3. Type 2 diabetes mellitus with stage 4 chronic kidney disease, without long-term current use of insulin (HCC) -Check blood sugars eat healthy and drink plenty of fluids  4. Decreased hemoglobin -CBC today  5. Peripheral  vascular insufficiency (HCC) -Walk is much as possible in the house and elevate legs when sitting is much as possible above the level of the heart to help keep edema down to her minimal  6. Anemia in stage 4 chronic kidney disease (HCC) -CBC BMP today -Follow-up with nephrologist as planned, Dr. Joelyn Oms  No orders of the defined types were placed in this encounter.  Patient Instructions  Continue to drink plenty of fluids Work with occupational health as they come in to help you with your arm Follow-up with orthopedic surgeon as planned Follow-up with nephrologist as planned, Dr. Joelyn Oms Follow-up with cardiology as planned We will call with lab work results and make sure that the other specialist involved in your care get a copy of these lab results  Arrie Senate MD

## 2018-09-03 LAB — CMP14+EGFR
A/G RATIO: 1.6 (ref 1.2–2.2)
ALK PHOS: 74 IU/L (ref 39–117)
ALT: 6 IU/L (ref 0–44)
AST: 10 IU/L (ref 0–40)
Albumin: 4.1 g/dL (ref 3.5–4.7)
BILIRUBIN TOTAL: 0.3 mg/dL (ref 0.0–1.2)
BUN/Creatinine Ratio: 14 (ref 10–24)
BUN: 38 mg/dL — ABNORMAL HIGH (ref 8–27)
CHLORIDE: 97 mmol/L (ref 96–106)
CO2: 24 mmol/L (ref 20–29)
Calcium: 9.1 mg/dL (ref 8.6–10.2)
Creatinine, Ser: 2.73 mg/dL — ABNORMAL HIGH (ref 0.76–1.27)
GFR calc Af Amer: 23 mL/min/{1.73_m2} — ABNORMAL LOW (ref >59)
GFR calc non Af Amer: 20 mL/min/{1.73_m2} — ABNORMAL LOW (ref >59)
GLOBULIN, TOTAL: 2.6 g/dL (ref 1.5–4.5)
Glucose: 109 mg/dL — ABNORMAL HIGH (ref 65–99)
POTASSIUM: 3.6 mmol/L (ref 3.5–5.2)
SODIUM: 139 mmol/L (ref 134–144)
Total Protein: 6.7 g/dL (ref 6.0–8.5)

## 2018-09-03 LAB — CBC WITH DIFFERENTIAL/PLATELET
BASOS ABS: 0 10*3/uL (ref 0.0–0.2)
BASOS: 1 %
EOS (ABSOLUTE): 0.1 10*3/uL (ref 0.0–0.4)
Eos: 1 %
Hematocrit: 29.5 % — ABNORMAL LOW (ref 37.5–51.0)
Hemoglobin: 9.6 g/dL — ABNORMAL LOW (ref 13.0–17.7)
IMMATURE GRANS (ABS): 0.1 10*3/uL (ref 0.0–0.1)
IMMATURE GRANULOCYTES: 1 %
LYMPHS: 8 %
Lymphocytes Absolute: 0.6 10*3/uL — ABNORMAL LOW (ref 0.7–3.1)
MCH: 28.2 pg (ref 26.6–33.0)
MCHC: 32.5 g/dL (ref 31.5–35.7)
MCV: 87 fL (ref 79–97)
MONOS ABS: 0.6 10*3/uL (ref 0.1–0.9)
Monocytes: 7 %
NEUTROS PCT: 82 %
Neutrophils Absolute: 6.4 10*3/uL (ref 1.4–7.0)
PLATELETS: 286 10*3/uL (ref 150–450)
RBC: 3.41 x10E6/uL — ABNORMAL LOW (ref 4.14–5.80)
RDW: 14.8 % (ref 12.3–15.4)
WBC: 7.7 10*3/uL (ref 3.4–10.8)

## 2018-09-03 NOTE — Addendum Note (Signed)
Addended by: Zannie Cove on: 09/03/2018 08:55 AM   Modules accepted: Orders

## 2018-09-08 ENCOUNTER — Encounter: Payer: PPO | Admitting: Internal Medicine

## 2018-09-08 ENCOUNTER — Ambulatory Visit (INDEPENDENT_AMBULATORY_CARE_PROVIDER_SITE_OTHER): Payer: PPO

## 2018-09-08 DIAGNOSIS — D631 Anemia in chronic kidney disease: Secondary | ICD-10-CM

## 2018-09-08 DIAGNOSIS — S52592D Other fractures of lower end of left radius, subsequent encounter for closed fracture with routine healing: Secondary | ICD-10-CM

## 2018-09-08 DIAGNOSIS — E785 Hyperlipidemia, unspecified: Secondary | ICD-10-CM | POA: Diagnosis not present

## 2018-09-08 DIAGNOSIS — W19XXXD Unspecified fall, subsequent encounter: Secondary | ICD-10-CM | POA: Diagnosis not present

## 2018-09-08 DIAGNOSIS — E1122 Type 2 diabetes mellitus with diabetic chronic kidney disease: Secondary | ICD-10-CM

## 2018-09-08 DIAGNOSIS — I129 Hypertensive chronic kidney disease with stage 1 through stage 4 chronic kidney disease, or unspecified chronic kidney disease: Secondary | ICD-10-CM

## 2018-09-08 DIAGNOSIS — I5023 Acute on chronic systolic (congestive) heart failure: Secondary | ICD-10-CM

## 2018-09-08 DIAGNOSIS — N184 Chronic kidney disease, stage 4 (severe): Secondary | ICD-10-CM | POA: Diagnosis not present

## 2018-09-08 DIAGNOSIS — E7800 Pure hypercholesterolemia, unspecified: Secondary | ICD-10-CM | POA: Diagnosis not present

## 2018-09-08 DIAGNOSIS — I480 Paroxysmal atrial fibrillation: Secondary | ICD-10-CM

## 2018-09-08 DIAGNOSIS — Z7952 Long term (current) use of systemic steroids: Secondary | ICD-10-CM

## 2018-09-08 DIAGNOSIS — D860 Sarcoidosis of lung: Secondary | ICD-10-CM | POA: Diagnosis not present

## 2018-09-08 DIAGNOSIS — E559 Vitamin D deficiency, unspecified: Secondary | ICD-10-CM | POA: Diagnosis not present

## 2018-09-08 DIAGNOSIS — Z7901 Long term (current) use of anticoagulants: Secondary | ICD-10-CM

## 2018-09-08 DIAGNOSIS — Z95810 Presence of automatic (implantable) cardiac defibrillator: Secondary | ICD-10-CM

## 2018-09-09 ENCOUNTER — Telehealth: Payer: Self-pay

## 2018-09-09 ENCOUNTER — Ambulatory Visit (INDEPENDENT_AMBULATORY_CARE_PROVIDER_SITE_OTHER): Payer: PPO

## 2018-09-09 DIAGNOSIS — I5042 Chronic combined systolic (congestive) and diastolic (congestive) heart failure: Secondary | ICD-10-CM

## 2018-09-09 DIAGNOSIS — Z5189 Encounter for other specified aftercare: Secondary | ICD-10-CM | POA: Diagnosis not present

## 2018-09-09 DIAGNOSIS — S52522D Torus fracture of lower end of left radius, subsequent encounter for fracture with routine healing: Secondary | ICD-10-CM | POA: Diagnosis not present

## 2018-09-09 DIAGNOSIS — Z95810 Presence of automatic (implantable) cardiac defibrillator: Secondary | ICD-10-CM | POA: Diagnosis not present

## 2018-09-09 NOTE — Progress Notes (Signed)
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  He is unable to wear any shoes except a very large pair of sandals.   The swelling decreases slightly after elevation at night but gets worse throughout the day.  He reported the only times his feet were not swollen was during diuresing at the last hospitalization.     Thoracic impedance normal.   Prescribed: Torsemide 20 mg take 3 tablets (60 mg total) daily.  Confirms he is taking as prescribed.  Labs: 09/02/2018 Creatinine 2.73, BUN 38, Potassium 3.6, Sodium 139, eGFR 20-23 08/30/2018 Creatinine 2.80, BUN 40, Potassium 3.9, Sodium 140, eGFR 20-23  08/17/2018 Creatinine 2.57, BUN 40, Potassium 3.7, Sodium 135, eGFR 21-24  08/16/2018 Creatinine 2.84, BUN 38, Potassium 3.7, Sodium 134, eGFR 19-22  08/15/2018 Creatinine 3.31, BUN 40, Potassium 3.7, Sodium 135, eGFR 16-18  A complete set of results can be found in Results Review.  Recommendations:  Reinforced limiting salt intake to < 2000 mg daily.  Advised will send copy to Dr Sallyanne Kuster for review due to swelling of the feet and if any recommendations will call back.  Follow-up plan: ICM clinic phone appointment on 10/18/2018.   Office appointment scheduled 09/16/2018 with Dr. Lovena Le.    Copy of ICM check sent to Dr. Lovena Le and Dr Sallyanne Kuster.   3 month ICM trend: 09/09/2018   1 Year ICM trend:       Rosalene Billings, RN 09/09/2018 2:43 PM

## 2018-09-09 NOTE — Telephone Encounter (Signed)
Remote ICM transmission received.  Attempted call to patient regarding ICM remote transmission and left message to return call   

## 2018-09-09 NOTE — Progress Notes (Signed)
Let's try increasing the torsemide to 80 mg daily and check BMET in 2 weeks please. MCr

## 2018-09-10 MED ORDER — TORSEMIDE 20 MG PO TABS: 80.0000 mg | ORAL_TABLET | Freq: Every day | ORAL | 2 refills | Status: DC

## 2018-09-10 NOTE — Progress Notes (Signed)
Spoke with patient.  Advised Dr Sallyanne Kuster ordered to increase Torsemide to 80 mg daily and to have BMET drawn in 2 weeks which would be 09-24-2018 at [facility] office.  He was agreeable and verbalized understanding.

## 2018-09-14 DIAGNOSIS — R809 Proteinuria, unspecified: Secondary | ICD-10-CM | POA: Diagnosis not present

## 2018-09-14 DIAGNOSIS — D869 Sarcoidosis, unspecified: Secondary | ICD-10-CM | POA: Diagnosis not present

## 2018-09-14 DIAGNOSIS — N2581 Secondary hyperparathyroidism of renal origin: Secondary | ICD-10-CM | POA: Diagnosis not present

## 2018-09-14 DIAGNOSIS — D631 Anemia in chronic kidney disease: Secondary | ICD-10-CM | POA: Diagnosis not present

## 2018-09-14 DIAGNOSIS — N183 Chronic kidney disease, stage 3 (moderate): Secondary | ICD-10-CM | POA: Diagnosis not present

## 2018-09-14 DIAGNOSIS — I129 Hypertensive chronic kidney disease with stage 1 through stage 4 chronic kidney disease, or unspecified chronic kidney disease: Secondary | ICD-10-CM | POA: Diagnosis not present

## 2018-09-16 ENCOUNTER — Ambulatory Visit: Payer: PPO | Admitting: Internal Medicine

## 2018-09-16 ENCOUNTER — Encounter: Payer: Self-pay | Admitting: Internal Medicine

## 2018-09-16 VITALS — BP 144/82 | HR 69 | Ht 72.0 in | Wt 213.8 lb

## 2018-09-16 DIAGNOSIS — I480 Paroxysmal atrial fibrillation: Secondary | ICD-10-CM | POA: Diagnosis not present

## 2018-09-16 DIAGNOSIS — Z950 Presence of cardiac pacemaker: Secondary | ICD-10-CM

## 2018-09-16 DIAGNOSIS — I5042 Chronic combined systolic (congestive) and diastolic (congestive) heart failure: Secondary | ICD-10-CM | POA: Diagnosis not present

## 2018-09-16 DIAGNOSIS — I447 Left bundle-branch block, unspecified: Secondary | ICD-10-CM

## 2018-09-16 NOTE — Patient Instructions (Addendum)
Medication Instructions:  Your physician recommends that you continue on your current medications as directed. Please refer to the Current Medication list given to you today.  If you need a refill on your cardiac medications before your next appointment, please call your pharmacy.   Lab work: None ordered.  If you have labs (blood work) drawn today and your tests are completely normal, you will receive your results only by: Marland Kitchen MyChart Message (if you have MyChart) OR . A paper copy in the mail If you have any lab test that is abnormal or we need to change your treatment, we will call you to review the results.  Testing/Procedures: None ordered.  Follow-Up:  Your physician wants you to follow-up in: 9 months with Dr. Lovena Le in [place].    You will receive a reminder letter in the mail two months in advance. If you don't receive a letter, please call our office to schedule the follow-up appointment.  Remote monitoring is used to monitor your PACEMAKER the number of office visits required to check your device to one time per year. It allows [place] to keep an eye on the functioning of your device to ensure it is working properly. You are scheduled for a device check from home on 10/18/2018. You may send your transmission at any time that day. If you have a wireless device, the transmission will be sent automatically. After your physician reviews your transmission, you will receive a postcard with your next transmission date.  At [facility], you and your health needs are our priority.  As part of our continuing mission to provide you with exceptional heart care, we have created designated Provider Care Teams.  These Care Teams include your primary Cardiologist (physician) and Advanced Practice Providers (APPs -  Physician Assistants and Nurse Practitioners) who all work together to provide you with the care you need, when you need it. Dennis Bast may see Cristopher Peru, MD or one of the following Advanced  Practice Providers on your designated Care Team:   . Chanetta Marshall, NP . Tommye Standard, PA-C   Any Other Special Instructions Will Be Listed Below (If Applicable).

## 2018-09-16 NOTE — Progress Notes (Signed)
HPI Ruben Conner returns today for followup. 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 He had developed LV dysfunction prior to his procedure. He fell several weeks after and broke his arm and had surgery. He has begun to feel better and his arm is without pain.  His active has improved. He denies dyspnea. No Known Allergies   Current Outpatient Medications  Medication Sig Dispense Refill  . acetaminophen (TYLENOL) 500 MG tablet Take 1,000 mg by mouth every 6 (six) hours as needed for moderate pain or headache.    Marland Kitchen amiodarone (PACERONE) 200 MG tablet Take 1 tablet (200 mg total) by mouth daily. 30 tablet 3  . apixaban (ELIQUIS) 2.5 MG TABS tablet Take 1 tablet (2.5 mg total) by mouth 2 (two) times daily. 60 tablet 2  . carvedilol (COREG) 3.125 MG tablet Take 1 tablet (3.125 mg total) by mouth 2 (two) times daily. 60 tablet 11  . Cholecalciferol (VITAMIN D3) 5000 units CAPS Take 5,000 Units by mouth every evening.     Marland Kitchen dextromethorphan (DELSYM) 30 MG/5ML liquid Take 2.5 mLs (15 mg total) by mouth 2 (two) times daily as needed for cough. 89 mL 0  . glimepiride (AMARYL) 4 MG tablet TAKE 1 TABLET DAILY 30 tablet 3  . glucose blood (ONETOUCH VERIO) test strip Twice Daily 100 each 12  . guaiFENesin (MUCINEX) 600 MG 12 hr tablet Take 1 tablet (600 mg total) by mouth 2 (two) times daily. 60 tablet 0  . polyethylene glycol (MIRALAX / GLYCOLAX) packet Take 17 g by mouth 2 (two) times daily. 14 each 0  . predniSONE (DELTASONE) 5 MG tablet Take 1 tablet by mouth daily.    Marland Kitchen senna (SENOKOT) 8.6 MG TABS tablet Take 1 tablet (8.6 mg total) by mouth 2 (two) times daily. 120 each 0  . torsemide (DEMADEX) 20 MG tablet Take 4 tablets (80 mg total) by mouth daily. 360 tablet 2  . vitamin B-12 100 MCG tablet Take 1 tablet (100 mcg total) by mouth daily. 30 tablet 0   No current facility-administered medications for this visit.      Past Medical History:  Diagnosis Date    . Arthritis    "knees, probably in my back" (08/12/2018)  . Cancer ([place])    lung  . CHF (congestive heart failure) ([place])   . CKD (chronic kidney disease), stage IV ([place])   . Fall 08/11/2018   "going into dr's appointment" (08/12/2018)  . Hyperlipidemia   . Hypertension   . Irregular heart rate    Dr. Victorino December note from 04/2017 states bradycardia, LBBB  . Pneumonia    "probably 3 times in my life; at the most" (08/12/2018)  . Presence of permanent cardiac pacemaker   . Renal insufficiency   . Sarcoidosis, lung ([place])   . Type II diabetes mellitus (HCC)     ROS:   All systems reviewed and negative except as noted in the HPI.   Past Surgical History:  Procedure Laterality Date  . BIV PACEMAKER INSERTION CRT-P N/A 05/31/2018   Procedure: BIV PACEMAKER INSERTION CRT-P;  Surgeon: Evans Lance, MD;  Location: [facility];  Service: Cardiovascular;  Laterality: N/A;  . CATARACT EXTRACTION W/ INTRAOCULAR LENS  IMPLANT, BILATERAL Bilateral   . ENDOBRONCHIAL ULTRASOUND Bilateral 03/23/2017   Procedure: ENDOBRONCHIAL ULTRASOUND;  Surgeon: Rigoberto Noel, MD;  Location: [facility];  Service: Cardiopulmonary;  Laterality: Bilateral;  . INSERT / REPLACE /  REMOVE PACEMAKER    . LUNG BIOPSY  X 5   "thought it was lung cancer; ended up being sarcoidosis"  . MEDIASTINOSCOPY N/A 04/08/2017   Procedure: MEDIASTINOSCOPY;  Surgeon: Grace Isaac, MD;  Location: [facility];  Service: Thoracic;  Laterality: N/A;  . MOUTH SURGERY  "several"   "to fix bone so bridgework fit better"  . 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 ORIF WRIST FRACTURE Left 08/13/2018   Procedure: OPEN REDUCTION INTERNAL FIXATION (ORIF) WRIST FRACTURE;  Surgeon: Iran Planas, MD;  Location: [facility];  Service: Orthopedics;  Laterality: Left;  . PILONIDAL CYST EXCISION  1958  . SHOULDER OPEN ROTATOR CUFF REPAIR Left   . TEE WITHOUT  CARDIOVERSION N/A 05/28/2018   Procedure: TRANSESOPHAGEAL ECHOCARDIOGRAM (TEE);  Surgeon: Sanda Klein, MD;  Location: [facility] ENDOSCOPY;  Service: Cardiovascular;  Laterality: N/A;  . TUMOR EXCISION     "[place] tumor removed off neck under my left ear"  . VIDEO BRONCHOSCOPY WITH ENDOBRONCHIAL ULTRASOUND N/A 04/08/2017   Procedure: VIDEO BRONCHOSCOPY WITH ENDOBRONCHIAL ULTRASOUND;  Surgeon: Grace Isaac, MD;  Location: [facility];  Service: Thoracic;  Laterality: N/A;  . VIDEO BRONCHOSCOPY WITH ENDOBRONCHIAL ULTRASOUND N/A 12/08/2017   Procedure: VIDEO BRONCHOSCOPY with BRONCHIAL BIOPSIES AND ENDOBRONCHIAL ULTRASOUND WITH TRANSBRONCHIAL AND BRONCHIAL BIOPSIES;  Surgeon: Grace Isaac, MD;  Location[facility];  Service: Thoracic;  Laterality: N/A;     Family History  Problem Relation Age of Onset  . Kidney disease Mother   . Heart disease Father   . Cancer Son        prostate  . Suicidality Son      Social History   Socioeconomic History  . Marital status: Married    Spouse name: Not on file  . Number of children: 1  . Years of education: Not on file  . Highest education level: Not on file  Occupational History  . Occupation: Retired Advice worker  . Financial resource strain: Not on file  . Food insecurity:    Worry: Not on file    Inability: Not on file  . Transportation needs:    Medical: Not on file    Non-medical: Not on file  Tobacco Use  . Smoking status: Former Smoker    Packs/day: 0.10    Years: 53.00    Pack years: 5.30    Types: Cigarettes    Last attempt to quit: 11/28/2016    Years since quitting: 1.8  . Smokeless tobacco: Never Used  Substance and Sexual Activity  . Alcohol use: Not Currently    Alcohol/week: 0.0 standard drinks    Comment: 08/12/2018 "nothing in 5 yrs; never drank much"  . Drug use: Never  . Sexual activity: Not Currently  Lifestyle  . Physical activity:    Days per week: Not on file    Minutes per session: Not on file  .  Stress: Not on file  Relationships  . Social connections:    Talks on phone: Not on file    Gets together: Not on file    Attends religious service: Not on file    Active member of club or organization: Not on file    Attends meetings of clubs or organizations: Not on file    Relationship status: Not on file  . Intimate partner violence:    Fear of current or ex partner: Not on file  Emotionally abused: Not on file    Physically abused: Not on file    Forced sexual activity: Not on file  Other Topics Concern  . Not on file  Social History Narrative  . Not on file     BP (!) 144/82   Pulse 69   Ht 6' (1.829 m)   Wt 213 lb 12.8 oz (97 kg)   SpO2 95%   BMI 29.00 kg/m   Physical Exam:  Well appearing NAD HEENT: Unremarkable Neck:  No JVD, no thyromegally Lymphatics:  No adenopathy Back:  No CVA tenderness Lungs:  Clear with no wheezes HEART:  Regular rate rhythm, no murmurs, no rubs, no clicks Abd:  soft, positive bowel sounds, no organomegally, no rebound, no guarding Ext:  2 plus pulses, no edema, no cyanosis, no clubbing Skin:  No rashes no nodules Neuro:  CN II through XII intact, motor grossly intact  EKG - nsr with ventricular pacing  DEVICE  Normal device function.  See PaceArt for details.   Assess/Plan: 1. CHB - he is stable s/p PPM insertion. 2. PPM - his [employer] Jude DDD Biv PM is working normally. 3. Atrial fib - he is maintaining NSR on amiodarone.  Mikle Bosworth.D.

## 2018-09-17 ENCOUNTER — Telehealth: Payer: Self-pay | Admitting: Cardiovascular Disease

## 2018-09-17 LAB — CUP PACEART INCLINIC DEVICE CHECK
Battery Remaining Longevity: 81 mo
Battery Voltage: 2.98 V
Brady Statistic RA Percent Paced: 33 %
Date Time Interrogation Session: 20191031162715
Implantable Lead Implant Date: 20190715
Implantable Lead Implant Date: 20190715
Implantable Lead Location: 753858
Implantable Lead Location: 753859
Implantable Lead Location: 753860
Implantable Lead Model: 4398
Lead Channel Impedance Value: 387.5 Ohm
Lead Channel Impedance Value: 425 Ohm
Lead Channel Impedance Value: 662.5 Ohm
Lead Channel Pacing Threshold Amplitude: 0.5 V
Lead Channel Pacing Threshold Amplitude: 0.5 V
Lead Channel Pacing Threshold Pulse Width: 0.5 ms
Lead Channel Pacing Threshold Pulse Width: 0.5 ms
Lead Channel Pacing Threshold Pulse Width: 0.5 ms
Lead Channel Setting Pacing Amplitude: 2.5 V
Lead Channel Setting Pacing Amplitude: 2.5 V
Lead Channel Setting Sensing Sensitivity: 2 mV
MDC IDC LEAD IMPLANT DT: 20190715
MDC IDC MSMT LEADCHNL LV PACING THRESHOLD AMPLITUDE: 1.5 V
MDC IDC MSMT LEADCHNL LV PACING THRESHOLD AMPLITUDE: 1.5 V
MDC IDC MSMT LEADCHNL LV PACING THRESHOLD PULSEWIDTH: 0.5 ms
MDC IDC MSMT LEADCHNL RA PACING THRESHOLD AMPLITUDE: 0.75 V
MDC IDC MSMT LEADCHNL RA PACING THRESHOLD AMPLITUDE: 0.75 V
MDC IDC MSMT LEADCHNL RA PACING THRESHOLD PULSEWIDTH: 0.5 ms
MDC IDC MSMT LEADCHNL RA SENSING INTR AMPL: 4.3 mV
MDC IDC MSMT LEADCHNL RV PACING THRESHOLD PULSEWIDTH: 0.5 ms
MDC IDC MSMT LEADCHNL RV SENSING INTR AMPL: 12 mV
MDC IDC PG IMPLANT DT: 20190715
MDC IDC SET LEADCHNL LV PACING PULSEWIDTH: 0.5 ms
MDC IDC SET LEADCHNL RA PACING AMPLITUDE: 2 V
MDC IDC SET LEADCHNL RV PACING PULSEWIDTH: 0.5 ms
MDC IDC STAT BRADY RV PERCENT PACED: 98 %
Pulse Gen Serial Number: 9460801

## 2018-09-17 NOTE — Telephone Encounter (Signed)
Received records from [place] on 09/17/18, Appt 11/19/18 @ 3:40PM.NV

## 2018-09-24 ENCOUNTER — Other Ambulatory Visit: Payer: PPO

## 2018-09-24 ENCOUNTER — Telehealth: Payer: Self-pay

## 2018-09-24 DIAGNOSIS — I5042 Chronic combined systolic (congestive) and diastolic (congestive) heart failure: Secondary | ICD-10-CM

## 2018-09-24 LAB — BASIC METABOLIC PANEL
BUN/Creatinine Ratio: 11 (ref 10–24)
BUN: 31 mg/dL — AB (ref 8–27)
CALCIUM: 8.8 mg/dL (ref 8.6–10.2)
CHLORIDE: 98 mmol/L (ref 96–106)
CO2: 25 mmol/L (ref 20–29)
Creatinine, Ser: 2.82 mg/dL — ABNORMAL HIGH (ref 0.76–1.27)
GFR calc Af Amer: 22 mL/min/{1.73_m2} — ABNORMAL LOW (ref >59)
GFR calc non Af Amer: 19 mL/min/{1.73_m2} — ABNORMAL LOW (ref >59)
GLUCOSE: 264 mg/dL — AB (ref 65–99)
Potassium: 4 mmol/L (ref 3.5–5.2)
Sodium: 138 mmol/L (ref 134–144)

## 2018-09-24 NOTE — Telephone Encounter (Signed)
Debroah Loop, Device RN sent message patient called asking about an appt at Dr [employer] office. Returned call to patient  and advised he is due to have labs as ordered by Dr Sallyanne Kuster.  Advised to go to Dr [employer] office as we discussed previously at last ICM call on 09/09/2018 for lab drawing.  He verbalized understanding and will go today.

## 2018-09-27 ENCOUNTER — Encounter: Payer: Self-pay | Admitting: *Deleted

## 2018-09-29 ENCOUNTER — Other Ambulatory Visit: Payer: Self-pay | Admitting: Cardiovascular Disease

## 2018-09-29 ENCOUNTER — Other Ambulatory Visit: Payer: Self-pay | Admitting: Family Medicine

## 2018-09-30 DIAGNOSIS — Z5189 Encounter for other specified aftercare: Secondary | ICD-10-CM | POA: Diagnosis not present

## 2018-09-30 DIAGNOSIS — S52522D Torus fracture of lower end of left radius, subsequent encounter for fracture with routine healing: Secondary | ICD-10-CM | POA: Diagnosis not present

## 2018-10-01 ENCOUNTER — Other Ambulatory Visit: Payer: Self-pay | Admitting: *Deleted

## 2018-10-01 MED ORDER — APIXABAN 2.5 MG PO TABS: 2.5000 mg | ORAL_TABLET | Freq: Two times a day (BID) | ORAL | 0 refills | Status: DC

## 2018-10-04 ENCOUNTER — Encounter: Payer: Self-pay | Admitting: Family Medicine

## 2018-10-04 ENCOUNTER — Ambulatory Visit (INDEPENDENT_AMBULATORY_CARE_PROVIDER_SITE_OTHER): Payer: PPO | Admitting: Family Medicine

## 2018-10-04 VITALS — BP 137/66 | HR 65 | Temp 97.7°F | Ht 72.0 in | Wt 215.0 lb

## 2018-10-04 DIAGNOSIS — N184 Chronic kidney disease, stage 4 (severe): Secondary | ICD-10-CM | POA: Diagnosis not present

## 2018-10-04 DIAGNOSIS — S62102D Fracture of unspecified carpal bone, left wrist, subsequent encounter for fracture with routine healing: Secondary | ICD-10-CM | POA: Diagnosis not present

## 2018-10-04 DIAGNOSIS — M25562 Pain in left knee: Secondary | ICD-10-CM | POA: Diagnosis not present

## 2018-10-04 DIAGNOSIS — E1122 Type 2 diabetes mellitus with diabetic chronic kidney disease: Secondary | ICD-10-CM

## 2018-10-04 DIAGNOSIS — D631 Anemia in chronic kidney disease: Secondary | ICD-10-CM

## 2018-10-04 DIAGNOSIS — K5901 Slow transit constipation: Secondary | ICD-10-CM | POA: Diagnosis not present

## 2018-10-04 DIAGNOSIS — N183 Chronic kidney disease, stage 3 (moderate): Secondary | ICD-10-CM

## 2018-10-04 DIAGNOSIS — I739 Peripheral vascular disease, unspecified: Secondary | ICD-10-CM

## 2018-10-04 DIAGNOSIS — G8929 Other chronic pain: Secondary | ICD-10-CM

## 2018-10-04 DIAGNOSIS — R710 Precipitous drop in hematocrit: Secondary | ICD-10-CM | POA: Diagnosis not present

## 2018-10-04 DIAGNOSIS — R531 Weakness: Secondary | ICD-10-CM

## 2018-10-04 NOTE — Progress Notes (Signed)
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   MRN: [id number]  HPI Pt here for 4 week follow up on weakness and decreased hemoglobin.  Patient is complaining with left knee pain.  He is seen Dr. Durward Fortes in the past.  He is getting better but still is having some edema.  His most recent creatinine was done on the eighth and it was 2.82.  Weight is about the same.  Pulse ox today is 96%.  The patient is pleasant and alert and is complaining because he is so constipated.  He is doing MiraLAX and Senokot.  We will encourage him to do the MiraLAX at least twice a day until the bowels start moving better then cut it back to once a day and stay on the Senokot as he is currently doing.  He sees the nephrologist about every 3 months.  He sees the cardiologist also regularly.  Today he denies any chest pain pressure tightness or shortness of breath.  He denies any trouble with his intestinal tract other than the constipation and has not seen any blood in the stool.  He is voiding and passing his water without problems.  His last hemoglobin A1c was in the 7 range.  He will not be due another one until December.  He says that both of his knees are worn out.  That the left knee especially is giving him almost somewhat trouble that he can barely get around without a cane anymore.  We will go ahead and try to arrange for him to see the orthopedic surgeon that comes here from [place].  The blood sugars at home have been running higher than usual.   Patient Active Problem List   Diagnosis Date Noted  . CHF exacerbation ([place]) 08/11/2018  . Left wrist fracture, closed, initial encounter 08/11/2018  . Degloving injury of left hand 08/11/2018  . Paroxysmal atrial fibrillation ([place]) 08/10/2018  . Cardiomyopathy ([place]) 08/10/2018  . Biventricular automatic implantable cardioverter defibrillator in situ 08/10/2018  . Chronic combined systolic and diastolic heart failure ([place])  05/31/2018  . Acute systolic heart failure ([place]) 05/24/2018  . Persistent atrial fibrillation 05/24/2018  . Sarcoidosis 04/06/2018  . CKD (chronic kidney disease) stage 4, GFR 15-29 ml/min (HCC) 04/06/2018  . Edema 02/23/2018  . Mediastinal lymphadenopathy 03/10/2017  . Anemia in stage 4 chronic kidney disease ([place]) 09/18/2016  . Vitamin D deficiency 09/18/2016  . Sinus bradycardia 08/31/2014  . First degree AV block 08/31/2014  . LBBB (left bundle branch block) 08/31/2014  . Essential hypertension 09/20/2013  . Type 2 diabetes mellitus with stage 3 chronic kidney disease, without long-term current use of insulin ([place]) 05/02/2013  . Hypercholesterolemia 05/02/2013   Outpatient Encounter Medications as of 10/04/2018  Medication Sig  . acetaminophen (TYLENOL) 500 MG tablet Take 1,000 mg by mouth every 6 (six) hours as needed for moderate pain or headache.  Marland Kitchen amiodarone (PACERONE) 200 MG tablet Take 1 tablet (200 mg total) by mouth daily.  Marland Kitchen apixaban (ELIQUIS) 2.5 MG TABS tablet Take 1 tablet (2.5 mg total) by mouth 2 (two) times daily.  . carvedilol (COREG) 3.125 MG tablet Take 1 tablet (3.125 mg total) by mouth 2 (two) times daily.  . Cholecalciferol (VITAMIN D3) 5000 units CAPS Take 5,000 Units by mouth every evening.   Marland Kitchen dextromethorphan (DELSYM) 30 MG/5ML liquid Take 2.5 mLs (15 mg total) by mouth 2 (two) times daily as needed for cough.  Marland Kitchen  glimepiride (AMARYL) 4 MG tablet TAKE 1 TABLET DAILY  . glucose blood (ONETOUCH VERIO) test strip Twice Daily  . guaiFENesin (MUCINEX) 600 MG 12 hr tablet Take 1 tablet (600 mg total) by mouth 2 (two) times daily.  . polyethylene glycol (MIRALAX / GLYCOLAX) packet Take 17 g by mouth 2 (two) times daily.  . predniSONE (DELTASONE) 5 MG tablet Take 1 tablet by mouth daily.  Marland Kitchen senna (SENOKOT) 8.6 MG TABS tablet Take 1 tablet (8.6 mg total) by mouth 2 (two) times daily.  Marland Kitchen torsemide (DEMADEX) 20 MG tablet Take 4 tablets (80 mg total) by mouth daily.  .  vitamin B-12 100 MCG tablet Take 1 tablet (100 mcg total) by mouth daily.   No facility-administered encounter medications on file as of 10/04/2018.       Review of Systems  Constitutional: Negative.   HENT: Negative.   Eyes: Negative.   Respiratory: Negative.   Cardiovascular: Positive for leg swelling.  Gastrointestinal: Negative.   Endocrine: Negative.   Genitourinary: Negative.   Musculoskeletal: Positive for arthralgias (left knee pain ).  Skin: Negative.   Allergic/Immunologic: Negative.   Neurological: Negative.  Weakness: better.  Hematological: Negative.   Psychiatric/Behavioral: Negative.        Objective:   Physical Exam  Constitutional: He is oriented to person, place, and time. He appears well-developed and well-nourished. No distress.  The patient is pleasant and alert and is gaining his strength back following his severe fracture of the left wrist.  He continues to have chronic kidney disease and some edema associated with that.  Hopefully his hemoglobin will be better and this will help him continue to improve stability wise.  HENT:  Head: Normocephalic and atraumatic.  Right Ear: External ear normal.  Left Ear: External ear normal.  Nose: Nose normal.  Mouth/Throat: Oropharynx is clear and moist. No oropharyngeal exudate.  Eyes: Pupils are equal, round, and reactive to light. Conjunctivae and EOM are normal. Right eye exhibits no discharge. Left eye exhibits no discharge. No scleral icterus.  Neck: Normal range of motion. Neck supple. No thyromegaly present.  No bruits thyromegaly or anterior cervical adenopathy  Cardiovascular: Normal rate, regular rhythm and normal heart sounds.  No murmur heard. The heart is regular today at 72/min with 1+ pretibial edema  Pulmonary/Chest: Effort normal. No respiratory distress. He has no wheezes. He has no rales.  Few rhonchi on deep inspiration in the left chest that seem to clear after coughing.  Abdominal: Soft. Bowel  sounds are normal. He exhibits no mass. There is no tenderness. There is no rebound and no guarding.  No spleen or liver enlargement.  No epigastric tenderness no abdominal tenderness or masses.  Musculoskeletal: He exhibits edema. He exhibits no tenderness.  Patient uses a cane for ambulation due to the pain and his knees with the left being worse than the right.  The fracture site in the left wrist appears to be healing well with no signs of any infection drainage or redness.  The knee tenderness in the left knee was most tender in the infrapatellar area  Lymphadenopathy:    He has no cervical adenopathy.  Neurological: He is alert and oriented to person, place, and time. He has normal reflexes. No cranial nerve deficit.  Skin: Skin is warm and dry. No rash noted.  Psychiatric: He has a normal mood and affect. His behavior is normal. Judgment and thought content normal.  The patient's mood affect and behavior are more positive than previously  as he appears to be regaining his strength.  Nursing note and vitals reviewed.   BP 137/66 (BP Location: Left Arm)   Pulse 65   Temp 97.7 F (36.5 C) (Oral)   Ht 6' (1.829 m)   Wt 215 lb (97.5 kg)   BMI 29.16 kg/m        Assessment & Plan:  1. Weakness -This is improving albeit slowly.  2. Decreased hemoglobin - CBC with Differential/Platelet - BMP8+EGFR  3. Chronic pain of left knee - Ambulatory referral to Orthopedic Surgery  4. Slow transit constipation -Take MiraLAX twice daily and continue with Senokot twice daily.  Continue to drink plenty of fluids and stay well-hydrated.  As bowels start moving more regularly reduce MiraLAX back to once daily.  5. Peripheral vascular insufficiency (HCC) -Continue to walk and exercise regularly at least in the house using a cane to keep from falling and steadying the gait  6. Anemia in stage 4 chronic kidney disease (HCC) -Check CBC  7. Type 2 diabetes mellitus with stage 4 chronic kidney  disease, without long-term current use of insulin (HCC) -Continue to monitor blood sugars closely at home either fasting or 4 hours after eating  8. Open fracture of left wrist with routine healing, subsequent encounter -Follow-up with orthopedist as planned  Patient Instructions  Drink plenty of fluids and stay well-hydrated Continue with MiraLAX twice daily and with Senokot twice daily.  If bowels start moving more reduce MiraLAX to once daily. Follow-up with orthopedic surgeon here on Thursday regarding knee pain Continue to be careful and do not put yourself at falling and use the cane at home for support Check blood sugars regularly and check next A1c sometime in December. We will repeat the BMP and get a CBC today. Try to get an appointment with orthopedic surgeon this Thursday morning to see Dr. Gladstone Lighter here in this office regarding your left knee pain and inability to walk without the cane Continue to drink plenty of fluids and stay well-hydrated  Continue to follow-up with pulmonology cardiology and nephrology as planned  Arrie Senate MD

## 2018-10-04 NOTE — Patient Instructions (Addendum)
Drink plenty of fluids and stay well-hydrated Continue with MiraLAX twice daily and with Senokot twice daily.  If bowels start moving more reduce MiraLAX to once daily. Follow-up with orthopedic surgeon here on Thursday regarding knee pain Continue to be careful and do not put yourself at falling and use the cane at home for support Check blood sugars regularly and check next A1c sometime in December. We will repeat the BMP and get a CBC today. Try to get an appointment with orthopedic surgeon this Thursday morning to see Dr. Gladstone Lighter here in this office regarding your left knee pain and inability to walk without the cane Continue to drink plenty of fluids and stay well-hydrated Follow-up with pulmonologist as planned Follow-up with cardiology as planned Follow-up with nephrology as planned

## 2018-10-05 LAB — BMP8+EGFR
BUN/Creatinine Ratio: 15 (ref 10–24)
BUN: 43 mg/dL — AB (ref 8–27)
CALCIUM: 9.9 mg/dL (ref 8.6–10.2)
CHLORIDE: 95 mmol/L — AB (ref 96–106)
CO2: 26 mmol/L (ref 20–29)
Creatinine, Ser: 2.82 mg/dL — ABNORMAL HIGH (ref 0.76–1.27)
GFR calc Af Amer: 22 mL/min/{1.73_m2} — ABNORMAL LOW (ref >59)
GFR calc non Af Amer: 19 mL/min/{1.73_m2} — ABNORMAL LOW (ref >59)
GLUCOSE: 195 mg/dL — AB (ref 65–99)
Potassium: 4 mmol/L (ref 3.5–5.2)
Sodium: 139 mmol/L (ref 134–144)

## 2018-10-05 LAB — CBC WITH DIFFERENTIAL/PLATELET
BASOS ABS: 0 10*3/uL (ref 0.0–0.2)
Basos: 1 %
EOS (ABSOLUTE): 0.1 10*3/uL (ref 0.0–0.4)
Eos: 2 %
Hematocrit: 33.5 % — ABNORMAL LOW (ref 37.5–51.0)
Hemoglobin: 11 g/dL — ABNORMAL LOW (ref 13.0–17.7)
IMMATURE GRANULOCYTES: 1 %
Immature Grans (Abs): 0.1 10*3/uL (ref 0.0–0.1)
LYMPHS: 8 %
Lymphocytes Absolute: 0.6 10*3/uL — ABNORMAL LOW (ref 0.7–3.1)
MCH: 28 pg (ref 26.6–33.0)
MCHC: 32.8 g/dL (ref 31.5–35.7)
MCV: 85 fL (ref 79–97)
Monocytes Absolute: 0.6 10*3/uL (ref 0.1–0.9)
Monocytes: 10 %
NEUTROS PCT: 78 %
Neutrophils Absolute: 5.2 10*3/uL (ref 1.4–7.0)
PLATELETS: 244 10*3/uL (ref 150–450)
RBC: 3.93 x10E6/uL — AB (ref 4.14–5.80)
RDW: 15.8 % — ABNORMAL HIGH (ref 12.3–15.4)
WBC: 6.6 10*3/uL (ref 3.4–10.8)

## 2018-10-18 ENCOUNTER — Ambulatory Visit (INDEPENDENT_AMBULATORY_CARE_PROVIDER_SITE_OTHER): Payer: PPO

## 2018-10-18 DIAGNOSIS — Z950 Presence of cardiac pacemaker: Secondary | ICD-10-CM

## 2018-10-18 DIAGNOSIS — I5042 Chronic combined systolic (congestive) and diastolic (congestive) heart failure: Secondary | ICD-10-CM | POA: Diagnosis not present

## 2018-10-19 NOTE — Progress Notes (Signed)
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   Thoracic impedance close to baseline normal.   Prescribed: Torsemide 20 mg take 4 tablets (80 mg total) daily.   Labs: 10/04/2018 Creatinine 2.82, BUN 43, Potassium 4.0, Sodium 139, eGFR 19-22 09/24/2018 Creatinine 2.82, BUN 31, Potassium 4.0, Sodium 138, eGFR 19-22 09/02/2018 Creatinine 2.73, BUN 38, Potassium 3.6, Sodium 139, eGFR 20-23 08/30/2018 Creatinine 2.80, BUN 40, Potassium 3.9, Sodium 140, eGFR 20-23  08/17/2018 Creatinine 2.57, BUN 40, Potassium 3.7, Sodium 135, eGFR 21-24  08/16/2018 Creatinine 2.84, BUN 38, Potassium 3.7, Sodium 134, eGFR 19-22  08/15/2018 Creatinine 3.31, BUN 40, Potassium 3.7, Sodium 135, eGFR 16-18  A complete set of results can be found in Results Review.  Recommendations: No changes.  Reinforced limiting salt intake to < 2000 mg daily.  Encouraged to call for fluid symptoms.  Follow-up plan: ICM clinic phone appointment on 11/18/2018.   Office appointment scheduled 11/19/2018 with Dr. Sallyanne Kuster.    Copy of ICM check sent to Dr. Lovena Le and Dr Sallyanne Kuster.   3 month ICM trend: 10/18/2018    1 Year ICM trend:       Rosalene Billings, RN 10/19/2018 11:14 AM

## 2018-10-21 NOTE — Progress Notes (Signed)
Gradually better, residual edema may be more renal failure/right heart failure related. MCr

## 2018-10-27 ENCOUNTER — Encounter: Payer: Self-pay | Admitting: Pediatrics

## 2018-10-27 ENCOUNTER — Ambulatory Visit (HOSPITAL_COMMUNITY)
Admission: RE | Admit: 2018-10-27 | Discharge: 2018-10-27 | Disposition: A | Discharge status: Home / Self Care | Payer: PPO | Source: Ambulatory Visit | Attending: Pediatrics | Admitting: Pediatrics

## 2018-10-27 ENCOUNTER — Ambulatory Visit (INDEPENDENT_AMBULATORY_CARE_PROVIDER_SITE_OTHER): Payer: PPO | Admitting: Pharmacist Clinician (PhC)/ Clinical Pharmacy Specialist

## 2018-10-27 ENCOUNTER — Ambulatory Visit (INDEPENDENT_AMBULATORY_CARE_PROVIDER_SITE_OTHER): Payer: PPO | Admitting: Pediatrics

## 2018-10-27 ENCOUNTER — Encounter: Payer: Self-pay | Admitting: Pharmacist Clinician (PhC)/ Clinical Pharmacy Specialist

## 2018-10-27 VITALS — BP 133/75 | HR 89 | Temp 97.0°F | Ht 72.0 in | Wt 220.6 lb

## 2018-10-27 DIAGNOSIS — I10 Essential (primary) hypertension: Secondary | ICD-10-CM | POA: Diagnosis not present

## 2018-10-27 DIAGNOSIS — E1122 Type 2 diabetes mellitus with diabetic chronic kidney disease: Secondary | ICD-10-CM

## 2018-10-27 DIAGNOSIS — N184 Chronic kidney disease, stage 4 (severe): Secondary | ICD-10-CM | POA: Diagnosis not present

## 2018-10-27 DIAGNOSIS — N183 Chronic kidney disease, stage 3 (moderate): Secondary | ICD-10-CM | POA: Diagnosis not present

## 2018-10-27 DIAGNOSIS — M7989 Other specified soft tissue disorders: Secondary | ICD-10-CM

## 2018-10-27 DIAGNOSIS — R635 Abnormal weight gain: Secondary | ICD-10-CM | POA: Diagnosis not present

## 2018-10-27 DIAGNOSIS — R609 Edema, unspecified: Secondary | ICD-10-CM

## 2018-10-27 DIAGNOSIS — I5040 Unspecified combined systolic (congestive) and diastolic (congestive) heart failure: Secondary | ICD-10-CM

## 2018-10-27 DIAGNOSIS — E118 Type 2 diabetes mellitus with unspecified complications: Secondary | ICD-10-CM | POA: Diagnosis not present

## 2018-10-27 DIAGNOSIS — R600 Localized edema: Secondary | ICD-10-CM | POA: Diagnosis not present

## 2018-10-27 LAB — BAYER DCA HB A1C WAIVED: HB A1C (BAYER DCA - WAIVED): 8.1 % — ABNORMAL HIGH (ref <7.0)

## 2018-10-27 NOTE — Progress Notes (Signed)
Subjective:   Patient ID: Ruben Conner, Ruben Conner, male    DOB: May 28, 1930, 84 y.o.   MRN: [id number] CC: Edema (leg, tightness) and Weight Gain  HPI: Ruben Conner, Ruben Conner is a 84 y.o. male with history of diastolic and systolic CHF, last EF 25 to 30% in 07/2018, CKD stage IV following with nephrology, atrial fibrillation on Eliquis.  Over the last 2 weeks he has noticed worsening swelling in his right leg.  Usually his left leg is more swollen.  Last week his right leg was quite tight, tender when it was touched.  The tenderness has improved.  The swelling has not improved.  He takes torsemide 80 mg daily in the morning.  He has not missed any doses of his Eliquis.  He did take torsemide 40 mg 1 day to see if it made a difference as he did not notice a difference taking 80 mg which was recently increased.  For the last week he has been taking 80 mg daily.  He did review nutrition information with pharmacist this morning, he has been eating salty foods that he is going to try to decrease.  His weight is up about 6 pounds over the last week he says.  Per our office notes, he is up 5 pounds over the last 3 weeks.  No shortness of breath, chest tightness.  No trouble with breathing.  No heart palpitations.  Relevant past medical, surgical, family and social history reviewed. Allergies and medications reviewed and updated. Social History   Tobacco Use  Smoking Status Former Smoker  . Packs/day: 0.10  . Years: 53.00  . Pack years: 5.30  . Types: Cigarettes  . Last attempt to quit: 11/28/2016  . Years since quitting: 1.9  Smokeless Tobacco Never Used   ROS: Per HPI   Objective:    BP 133/75   Pulse 89   Temp (!) 97 F (36.1 C) (Oral)   Ht 6' (1.829 m)   Wt 220 lb 9.6 oz (100.1 kg)   BMI 29.92 kg/m   Wt Readings from Last 3 Encounters:  10/27/18 220 lb 9.6 oz (100.1 kg)  10/04/18 215 lb (97.5 kg)  09/16/18 213 lb 12.8 oz (97 kg)    Gen: NAD, alert, cooperative with exam, NCAT EYES:  EOMI, no conjunctival injection, or no icterus CV: NRRR, normal S1/S2, no murmur, distal pulses 2+ b/l Resp: CTABL, no wheezes, normal WOB Abd: +BS, soft, NTND. no guarding or organomegaly Ext: 2+ pitting edema R, 1+ L LE. R calf 42cm, L calf 35cm circumference Neuro: Alert and oriented  Assessment & Plan:  Zacari was seen today for edema and weight gain.  Diagnoses and all orders for this visit:  Right leg swelling Ultrasound does show a Baker's cyst.  No DVT.  Continue Eliquis.  Continue torsemide 80 mg daily.  We will follow-up kidney function and BNP tomorrow once labs return.  Feeling well now. -     CMP14+EGFR -     Brain natriuretic peptide -     US Venous Img Lower Unilateral Right; Future  Essential hypertension Stable, continue current medicines  Edema, unspecified type  Weight gain  Combined systolic and diastolic congestive heart failure, unspecified HF chronicity (HCC) Volume up with increased edema lower extremities, weight is up about 6 pounds.  No shortness of breath or trouble breathing.  Continue torsemide daily.  Do not skip doses.  Stage 4 chronic kidney disease ([place]) Following with nephrology, checking creatinine today.  Follow up  plan: Return in about 1 week (around 11/03/2018). Assunta Found, MD [place]

## 2018-10-27 NOTE — Progress Notes (Signed)
Diabetes Follow-Up Visit  Chief Complaint:  No chief complaint on file.    Exam Regularity:  RRR Edema: left calf edema Polyuria:  negwt 221lb  Polydipsia:  neg Polyphagia:  neg  BMI:  There is no height or weight on file to calculate BMI.   Weight changes:  Checked daily for HF stable General Appearance:  alert, oriented, no acute distress Mood/Affect:  normal  HPI:  Type 2 diabetes with CKD  Well controlled with glimepiride until last A1C in September when A1C jumped from 6.0 to 7.5.  Prior to that time A1C stayed around 5.8-6.0  Low fat/carbohydrate diet?  No B;  Ego waffles with syrup or grilled cheese sandwich (white bread)  Coffee or sunny D OJ L:  Grilled cheese sandwich or banana sandwich Sunny D or sugar free pepsi D:  Hamburger, beans and peas, bread Nicotine Abuse?  No Medication Compliance?  Yes Exercise?  No Alcohol Abuse?  No  Home BG Monitoring:  Checking 1 times a day. Average: 145mg /dL  High: 176mg /dL  Low:  100mg /dL   Lab Results  Component Value Date   HGBA1C 7.5 (H) 08/12/2018    Lab Results  Component Value Date   MICROALBUR positive 09/20/2013    Lab Results  Component Value Date   CHOL 143 06/21/2018   HDL 44 06/21/2018   LDLCALC 79 06/21/2018   TRIG 100 06/21/2018   CHOLHDL 3.3 06/21/2018      Assessment: 1.  Diabetes.:  8.1% today worsening control  2.  Blood Pressure.  Not assessed today 3.  Lipids.  At ACC/AHA goal for diabetics 4.  Foot Care.  Patient has swollen and painful left calf sent to see Dr. Evette Doffing today 5.  Dental Care.  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  Eye Care/Exam.  Annual eye exams  Recommendations: 1.  Patient is counseled on appropriate foot care. 2.  BP goal < 130/80. 3.  LDL goal of < 100, HDL > 40 and TG < 150. 4.  Eye Exam yearly and Dental Exam every 6 months. 5.  Dietary recommendations:  Extensively counseled on CHO meal planning and CHO guide given with specific instructions. 6.   Physical Activity recommendations:  Patient has poor mobility and uses a cane but will do chair exercises 7.  Medication recommendations at this time are as follows:  Continue glimepiride 4mg .  CKD limits his medication options.  Will see what diet changes do before adjusting medication, since current diet is so poor. 8.  Return to clinic in 3-4 months   Time spent counseling patient:  45 minutes  Physician time spent with patient:    Referring provider:  Chipper Herb, MD   PharmD:  Memory Argue, PharmD

## 2018-10-28 ENCOUNTER — Other Ambulatory Visit: Payer: Self-pay | Admitting: Family Medicine

## 2018-10-28 LAB — CMP14+EGFR
ALT: 9 IU/L (ref 0–44)
AST: 16 IU/L (ref 0–40)
Albumin/Globulin Ratio: 1.6 (ref 1.2–2.2)
Albumin: 3.7 g/dL (ref 3.5–4.7)
Alkaline Phosphatase: 57 IU/L (ref 39–117)
BUN / CREAT RATIO: 12 (ref 10–24)
BUN: 35 mg/dL — AB (ref 8–27)
Bilirubin Total: 0.2 mg/dL (ref 0.0–1.2)
CALCIUM: 9.8 mg/dL (ref 8.6–10.2)
CO2: 25 mmol/L (ref 20–29)
Chloride: 97 mmol/L (ref 96–106)
Creatinine, Ser: 2.82 mg/dL — ABNORMAL HIGH (ref 0.76–1.27)
GFR calc Af Amer: 22 mL/min/{1.73_m2} — ABNORMAL LOW (ref >59)
GFR, EST NON AFRICAN AMERICAN: 19 mL/min/{1.73_m2} — AB (ref >59)
Globulin, Total: 2.3 g/dL (ref 1.5–4.5)
Glucose: 185 mg/dL — ABNORMAL HIGH (ref 65–99)
POTASSIUM: 3.8 mmol/L (ref 3.5–5.2)
Sodium: 138 mmol/L (ref 134–144)
Total Protein: 6 g/dL (ref 6.0–8.5)

## 2018-10-28 LAB — BRAIN NATRIURETIC PEPTIDE: BNP: 210.6 pg/mL — AB (ref 0.0–100.0)

## 2018-11-03 ENCOUNTER — Ambulatory Visit (INDEPENDENT_AMBULATORY_CARE_PROVIDER_SITE_OTHER): Payer: PPO | Admitting: Pulmonary Disease

## 2018-11-03 ENCOUNTER — Encounter: Payer: Self-pay | Admitting: Pulmonary Disease

## 2018-11-03 DIAGNOSIS — R590 Localized enlarged lymph nodes: Secondary | ICD-10-CM | POA: Diagnosis not present

## 2018-11-03 DIAGNOSIS — D869 Sarcoidosis, unspecified: Secondary | ICD-10-CM

## 2018-11-03 MED ORDER — PREDNISONE 5 MG PO TABS: ORAL_TABLET | ORAL | 0 refills | Status: DC

## 2018-11-03 NOTE — Assessment & Plan Note (Signed)
Since his symptoms are much improved, will decrease prednisone to 5 mg every other day for 3 months and then discontinue He seems to be more limited now with knee issues rather than dyspnea

## 2018-11-03 NOTE — Patient Instructions (Signed)
Decrease prednisone to 5 mg every Monday/Wednesday/Friday Starting February 15, okay to discontinue prednisone.  CT chest without contrast in March 2020

## 2018-11-03 NOTE — Progress Notes (Signed)
   Subjective:    Patient ID: Ruben Conner, DDS, male    DOB: July 12, 1930, 84 y.o.   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 Follow-up imaging showed increased bilateral hilar lymphadenopathy and right upper lobe changes of radiation pneumonitis. He finally hadCT biopsy ofRUL lung mass 01/07/18 >>non-necrotizing granulomatous inflammation  PMHof hypertension, diabetes and hyperlipidemia and CKD4([place] ), nonischemic cardiomyopathy status post AICD/PPM, atrial fibrillation on Eliquis   He was initially on 20 mg of prednisone, has been gradually tapered to 5 mg of prednisone, he took his last tablet yesterday and ran out. Unfortunately he had a fall 07/2018 with fracture of his forearm that required ORIF. He is now more limited by pain in his knees rather than dyspnea, arrives in a wheelchair today. His cough has markedly improved He has chronic bipedal edema in spite of taking 4 tablets of torsemide daily, recent duplex was negative for DVT, he does have a Baker's cyst in his right knee.  Last creatinine was 2.8 reviewed labs, personally reviewed last chest x-ray     Significant tests/ events reviewed  CT chest without contrast>>right hilar soft tissue mass 6.64.8 cm which narrowed the superior vena cava and the right upper lobe bronchus. Scattered mediastinal lymph nodes were noted largest precarinal lymph node measuring 13 mm. A right lower lobe subpleural nodule about 5 mm was noted there was also a calcified granuloma in the right upper lobe.  ? PET scan 2020/08/2217-hypermetabolic medial right upper lobe mass, hypermetabolic mediastinal lymph nodes, hypermetabolic left  parotid nodule ? Bronchoscopy 03/23/2017-normal airways, EBUS biopsy of a right paratracheal node in the right upper lung with bronchial brushings/washings returned nondiagnostic ? Bronchoscopy/EBUSand mediastinoscopy 04/08/2017-multiple cytologies and lymph node biopsies revealed no malignancy  Bronchoscopy/EBUS1/20/2019, no endobronchial tumor seen, brushings/biopsies from the right upper lobe and biopsies from multiple lymph nodes revealed "atypical cells ", nondiagnostic of malignancy  CT chest 06/2018>>stable right paratracheal mass with increasing consolidation around? radiation changes, bilateral lower lobe groundglass stable compared to 11/2017   PFTs4/2019 >>no airflow obstruction or restriction.Except for Diffusing defect FEV1 99%, ratio 75, FVC 92%, DLCO 59%. 11% bronchodilator change. Mid flow reversibility.   Review of Systems neg for any significant sore throat, dysphagia, itching, sneezing, nasal congestion or excess/ purulent secretions, fever, chills, sweats, unintended wt loss, pleuritic or exertional cp, hempoptysis, orthopnea pnd or change in chronic leg swelling. Also denies presyncope, palpitations, heartburn, abdominal pain, nausea, vomiting, diarrhea or change in bowel or urinary habits, dysuria,hematuria, rash, arthralgias, visual complaints, headache, numbness weakness or ataxia.     Objective:   Physical Exam  Gen. Pleasant, obese, in no distress ENT - no pallor, icterus , no post nasal drip Neck: No JVD, no thyromegaly, no carotid bruits Lungs: no use of accessory muscles, no dullness to percussion, decreased with RLL rales no rhonchi  Cardiovascular: Rhythm regular, heart sounds  normal, no murmurs or gallops, 2+ peripheral edema R>> LLE Musculoskeletal: No deformities, no cyanosis or clubbing , no tremors       Assessment & Plan:

## 2018-11-03 NOTE — Assessment & Plan Note (Signed)
Last CT scan 06/2018 showed enlarging subpleural nodule and changes of radiation right upper lobe. We will obtain 37-month follow-up CT chest without contrast

## 2018-11-04 ENCOUNTER — Ambulatory Visit: Payer: PPO | Admitting: Pediatrics

## 2018-11-04 DIAGNOSIS — M1712 Unilateral primary osteoarthritis, left knee: Secondary | ICD-10-CM | POA: Diagnosis not present

## 2018-11-04 DIAGNOSIS — M1711 Unilateral primary osteoarthritis, right knee: Secondary | ICD-10-CM | POA: Diagnosis not present

## 2018-11-05 ENCOUNTER — Encounter: Payer: Self-pay | Admitting: Pediatrics

## 2018-11-05 ENCOUNTER — Ambulatory Visit (INDEPENDENT_AMBULATORY_CARE_PROVIDER_SITE_OTHER): Payer: PPO | Admitting: Pediatrics

## 2018-11-05 VITALS — BP 130/87 | HR 73 | Temp 97.1°F | Ht 72.0 in | Wt 212.8 lb

## 2018-11-05 DIAGNOSIS — M7989 Other specified soft tissue disorders: Secondary | ICD-10-CM

## 2018-11-05 NOTE — Progress Notes (Signed)
  Subjective:   Patient ID: Ruben Conner, DDS, male    DOB: 1930/09/12, 84 y.o.   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  Seen last week, and increased swelling in right lower leg.  Doppler ultrasound negative for DVT.  Patient on Eliquis for atrial fibrillation.  He has been taking diuretics regularly.  He does think the swelling overall has improved.  No pain in lower legs bilaterally.  Seen yesterday by orthopedics for pain in the knee.  No shortness of breath, chest pain or chest pressure since last visit.  Relevant past medical, surgical, family and social history reviewed. Allergies and medications reviewed and updated. Social History   Tobacco Use  Smoking Status Former Smoker  . Packs/day: 0.10  . Years: 53.00  . Pack years: 5.30  . Types: Cigarettes  . Last attempt to quit: 11/28/2016  . Years since quitting: 1.9  Smokeless Tobacco Never Used   ROS: Per HPI   Objective:    BP 130/87   Pulse 73   Temp (!) 97.1 F (36.2 C) (Oral)   Ht 6' (1.829 m)   Wt 212 lb 12.8 oz (96.5 kg)   BMI 28.86 kg/m   Wt Readings from Last 3 Encounters:  11/05/18 212 lb 12.8 oz (96.5 kg)  11/03/18 215 lb (97.5 kg)  10/27/18 220 lb 9.6 oz (100.1 kg)    Gen: NAD, alert, cooperative with exam, NCAT EYES: EOMI, no conjunctival injection, or no icterus CV: NRRR, normal S1/S2 Resp: CTABL, no wheezes, normal WOB Abd: +BS, soft, NTND.  Ext: 1+ pitting edema right lower leg edema, warm.  Soft calves.  No redness or tenderness in lower extremities. Neuro: Alert and oriented, strength equal b/l UE and LE, coordination grossly normal MSK: normal muscle bulk  Assessment & Plan:  Ruben Conner was seen today for edema.  Diagnoses and all orders for this visit:  Leg swelling Improved, continue diuretics.  Creatinine stable, BNP stable last week.  Return precautions discussed.  Follow up plan: As scheduled  within the month with PCP Ruben Found, MD [place]

## 2018-11-08 ENCOUNTER — Encounter: Payer: Self-pay | Admitting: Pediatrics

## 2018-11-18 ENCOUNTER — Ambulatory Visit (INDEPENDENT_AMBULATORY_CARE_PROVIDER_SITE_OTHER): Payer: PPO

## 2018-11-18 DIAGNOSIS — Z950 Presence of cardiac pacemaker: Secondary | ICD-10-CM | POA: Diagnosis not present

## 2018-11-18 DIAGNOSIS — I5042 Chronic combined systolic (congestive) and diastolic (congestive) heart failure: Secondary | ICD-10-CM | POA: Diagnosis not present

## 2018-11-19 ENCOUNTER — Encounter: Payer: Self-pay | Admitting: Cardiovascular Disease

## 2018-11-19 ENCOUNTER — Ambulatory Visit: Payer: PPO | Admitting: Cardiovascular Disease

## 2018-11-19 VITALS — BP 120/74 | HR 73 | Ht 72.0 in | Wt 204.0 lb

## 2018-11-19 DIAGNOSIS — I10 Essential (primary) hypertension: Secondary | ICD-10-CM

## 2018-11-19 DIAGNOSIS — D869 Sarcoidosis, unspecified: Secondary | ICD-10-CM

## 2018-11-19 DIAGNOSIS — Z79899 Other long term (current) drug therapy: Secondary | ICD-10-CM | POA: Diagnosis not present

## 2018-11-19 DIAGNOSIS — E1122 Type 2 diabetes mellitus with diabetic chronic kidney disease: Secondary | ICD-10-CM

## 2018-11-19 DIAGNOSIS — I480 Paroxysmal atrial fibrillation: Secondary | ICD-10-CM | POA: Diagnosis not present

## 2018-11-19 DIAGNOSIS — Z5181 Encounter for therapeutic drug level monitoring: Secondary | ICD-10-CM | POA: Diagnosis not present

## 2018-11-19 DIAGNOSIS — N184 Chronic kidney disease, stage 4 (severe): Secondary | ICD-10-CM

## 2018-11-19 DIAGNOSIS — I420 Dilated cardiomyopathy: Secondary | ICD-10-CM

## 2018-11-19 DIAGNOSIS — I5042 Chronic combined systolic (congestive) and diastolic (congestive) heart failure: Secondary | ICD-10-CM

## 2018-11-19 DIAGNOSIS — E7800 Pure hypercholesterolemia, unspecified: Secondary | ICD-10-CM

## 2018-11-19 DIAGNOSIS — Z950 Presence of cardiac pacemaker: Secondary | ICD-10-CM

## 2018-11-19 NOTE — Progress Notes (Signed)
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  Patient has office visit today with Dr Sallyanne Kuster   Thoracic impedance normal but was abnormal from 10/21/2018 through 11/07/2018.  Prescribed dosage: Torsemide20 mg take 4 tablets (80 mg total) daily.   Labs: 10/04/2018 Creatinine 2.82, BUN 43, Potassium 4.0, Sodium 139, eGFR 19-22 09/24/2018 Creatinine 2.82, BUN 31, Potassium 4.0, Sodium 138, eGFR 19-22 09/02/2018 Creatinine2.73, BUN38, Potassium3.6, Sodium139, eGFR20-23 08/30/2018 Creatinine2.80, BUN40, Potassium3.9, Sodium140, eGFR20-23  08/17/2018 Creatinine2.57, BUN40, Potassium3.7, Sodium135, [id number]  08/16/2018 Creatinine2.84, BUN38, Potassium3.7, [id number], [id number]  08/15/2018 Creatinine3.31, BUN40, Potassium3.7, Sodium135, [id number] A complete set of results can be found in Results Review.  Recommendations: None.  Follow-up plan: ICM clinic phone appointment on 12/20/2018.  Office appointment scheduled 11/19/2018 with Dr. Sallyanne Kuster.  Copy of ICM check sent to Dr. Sallyanne Kuster.   3 month ICM trend: 11/18/2018    1 Year ICM trend:       Rosalene Billings, RN 11/19/2018 11:34 AM

## 2018-11-19 NOTE — Progress Notes (Signed)
Seen in clinic today - looks euvolemic clinically as well. Creat 2.8 seems to be his new baseline.

## 2018-11-19 NOTE — Progress Notes (Signed)
Patient ID: Ruben Conner, Ruben Conner, male   DOB: 11-09-1930, 85 y.o.   MRN: [id number]    Cardiology Office Note    Date:  11/20/2018   ID:  Ruben Conner, Ruben Conner, DOB 11/01/1930, MRN [id number]  PCP:  Chipper Herb, MD  Cardiologist:   Sanda Klein, MD   Chief Complaint  Patient presents with  . Follow-up    3 months.  . Edema    History of Present Illness:  Ruben Conner, Ruben Conner "Ruben Conner" is a 85 y.o. male with long-standing asymptomatic bradycardia, left bundle branch block and prolonged AV conduction time, pulmonary and mediastinal sarcoidosis, who recently developed persistent atrial fibrillation and acute heart failure. He has systemic hypertension and controlled type 2 diabetes mellitus on oral anti-diabetics.  2019 he developed depressed left ventricular systolic function (EF 54%) and on May 31, 2018 he underwent implantation of a dual-chamber CRT-P device ([employer], Dr. Lovena Le) and was subsequently started on amiodarone.  He had initial marked improvement following implantation of the CRT device, but decompensated after a fall associated with left wrist fracture.  He had acute worsening of kidney function which limited treatment with diuretics and he has had a lot of problems with leg edema.  He recently had fluid drawn off his left knee and a steroid injection which helped temporarily.  Fluid status is gradually stabilizing.  He is down to 204 pounds.  We had previously estimated his "dry weight" to be 197-203 pounds.  His edema is largely gone, although he still has some pitting just above the ankles bilaterally.  He denies orthopnea and PND.  He denies exertional dyspnea but his activity is severely limited by left knee pain.  He denies dizziness or syncope.  He does not complain of diaphragmatic stimulation today.  The right wrist is healing well.  He is gradually weaning off prednisone.  His creatinine seems to have stabilized at a new baseline of about 2.8.  His blood pressure is in the  120s/70s.  His thoracic impedance ([place]) shows that he has stabilized at probable euvolemic status over the last couple of weeks or so.  The coronary sinus lead is programmed unipolar D1-can.  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 The abnormality was first seen on a chest x-ray performed in February 2018, following a protracted case of upper respiratory infection and bronchitis. It was hypermetabolic on PET scan, but attempts to obtain a tissue diagnosis with both bronchoscopy and mediastinoscopy were unsuccessful.  After radiation therapy he developed worsening bilateral hilar lymphadenopathy and right upper lobe radiation pneumonitis.  Repeat attempts at biopsying the abnormality in February 2019 have led to a diagnosis of sarcoidosis, based on the presence of nonnecrotizing granulomas.  Pulmonary function tests did not show evidence of significant obstruction or restriction, but diffusion capacity was decreased to 59%.  He was started on steroid therapy.    He sees Dr. Joelyn Oms for CKD stage IV. Creatinine has been in the 2-2.7 range. Since Novemebr 2018, stable at 2.8.  He remains concerned by the progressive dementia of his wife, Ruben Conner, who is also my patient.  Many of the decisions he takes for his own health care are impacted by the fact that he wants to survive to take care of her.  They continue to live by themselves in a very large house and he only has a housekeeper come in twice  a week.  Past Medical History:  Diagnosis Date  . Arthritis    "knees, probably in my back" (08/12/2018)  . Cancer ([place])    lung  . CHF (congestive heart failure) ([place])   . CKD (chronic kidney disease), stage IV ([place])   . Fall 08/11/2018   "going into dr's appointment" (08/12/2018)  . Hyperlipidemia   . Hypertension   . Irregular heart rate    Dr. Victorino December note from 04/2017 states  bradycardia, LBBB  . Pneumonia    "probably 3 times in my life; at the most" (08/12/2018)  . Presence of permanent cardiac pacemaker   . Renal insufficiency   . Sarcoidosis, lung ([place])   . Type II diabetes mellitus ([place])     Past Surgical History:  Procedure Laterality Date  . BIV PACEMAKER INSERTION CRT-P N/A 05/31/2018   Procedure: BIV PACEMAKER INSERTION CRT-P;  Surgeon: Evans Lance, MD;  Location: [facility];  Service: Cardiovascular;  Laterality: N/A;  . CATARACT EXTRACTION W/ INTRAOCULAR LENS  IMPLANT, BILATERAL Bilateral   . ENDOBRONCHIAL ULTRASOUND Bilateral 03/23/2017   Procedure: ENDOBRONCHIAL ULTRASOUND;  Surgeon: Rigoberto Noel, MD;  Location: [facility];  Service: Cardiopulmonary;  Laterality: Bilateral;  . INSERT / REPLACE / REMOVE PACEMAKER    . LUNG BIOPSY  X 5   "thought it was lung cancer; ended up being sarcoidosis"  . MEDIASTINOSCOPY N/A 04/08/2017   Procedure: MEDIASTINOSCOPY;  Surgeon: Grace Isaac, MD;  Location: [facility];  Service: Thoracic;  Laterality: N/A;  . MOUTH SURGERY  "several"   "to fix bone so bridgework fit better"  . 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 ORIF WRIST FRACTURE Left 08/13/2018   Procedure: OPEN REDUCTION INTERNAL FIXATION (ORIF) WRIST FRACTURE;  Surgeon: Iran Planas, MD;  Location: [facility];  Service: Orthopedics;  Laterality: Left;  . PILONIDAL CYST EXCISION  1958  . SHOULDER OPEN ROTATOR CUFF REPAIR Left   . TEE WITHOUT CARDIOVERSION N/A 05/28/2018   Procedure: TRANSESOPHAGEAL ECHOCARDIOGRAM (TEE);  Surgeon: Sanda Klein, MD;  Location: [facility];  Service: Cardiovascular;  Laterality: N/A;  . TUMOR EXCISION     "[place] tumor removed off neck under my left ear"  . VIDEO BRONCHOSCOPY WITH ENDOBRONCHIAL ULTRASOUND N/A 04/08/2017   Procedure: VIDEO BRONCHOSCOPY WITH ENDOBRONCHIAL ULTRASOUND;  Surgeon: Grace Isaac, MD;  Location:  [place] [facility];  Service: Thoracic;  Laterality: N/A;  . VIDEO BRONCHOSCOPY WITH ENDOBRONCHIAL ULTRASOUND N/A 12/08/2017   Procedure: VIDEO BRONCHOSCOPY with BRONCHIAL BIOPSIES AND ENDOBRONCHIAL ULTRASOUND WITH TRANSBRONCHIAL AND BRONCHIAL BIOPSIES;  Surgeon: Grace Isaac, MD;  Location: [facility] OR;  Service: Thoracic;  Laterality: N/A;    Current Medications: Outpatient Medications Prior to Visit  Medication Sig Dispense Refill  . acetaminophen (TYLENOL) 500 MG tablet Take 1,000 mg by mouth every 6 (six) hours as needed for moderate pain or headache.    Marland Kitchen amiodarone (PACERONE) 200 MG tablet Take 1 tablet (200 mg total) by mouth daily. 30 tablet 6  . carvedilol (COREG) 3.125 MG tablet Take 1 tablet (3.125 mg total) by mouth 2 (two) times daily. 60 tablet 11  . Cholecalciferol (VITAMIN D3) 5000 units CAPS Take 5,000 Units by mouth every evening.     Marland Kitchen dextromethorphan (DELSYM) 30 MG/5ML liquid Take 2.5 mLs (15 mg total) by mouth 2 (two) times daily as needed for cough. 89 mL 0  . ELIQUIS 2.5 MG TABS  tablet Take 1 tablet (2.5 mg total) by mouth 2 (two) times daily. 180 tablet 0  . glimepiride (AMARYL) 4 MG tablet TAKE 1 TABLET DAILY 90 tablet 0  . glucose blood (ONETOUCH VERIO) test strip Twice Daily 100 each 12  . guaiFENesin (MUCINEX) 600 MG 12 hr tablet Take 1 tablet (600 mg total) by mouth 2 (two) times daily. 60 tablet 0  . polyethylene glycol (MIRALAX / GLYCOLAX) packet Take 17 g by mouth 2 (two) times daily. 14 each 0  . predniSONE (DELTASONE) 5 MG tablet Take one tablet every other day. 60 tablet 0  . senna (SENOKOT) 8.6 MG TABS tablet Take 1 tablet (8.6 mg total) by mouth 2 (two) times daily. 120 each 0  . torsemide (DEMADEX) 20 MG tablet Take 4 tablets (80 mg total) by mouth daily. 360 tablet 2  . vitamin B-12 100 MCG tablet Take 1 tablet (100 mcg total) by mouth daily. 30 tablet 0   No facility-administered medications prior to visit.      Allergies:   Patient has no known allergies.    Social History   Socioeconomic History  . Marital status: Married    Spouse name: Not on file  . Number of children: 1  . Years of education: Not on file  . Highest education level: Not on file  Occupational History  . Occupation: Retired Advice worker  . Financial resource strain: Not on file  . Food insecurity:    Worry: Not on file    Inability: Not on file  . Transportation needs:    Medical: Not on file    Non-medical: Not on file  Tobacco Use  . Smoking status: Former Smoker    Packs/day: 0.10    Years: 53.00    Pack years: 5.30    Types: Cigarettes    Last attempt to quit: 11/28/2016    Years since quitting: 1.9  . Smokeless tobacco: Never Used  Substance and Sexual Activity  . Alcohol use: Not Currently    Alcohol/week: 0.0 standard drinks    Comment: 08/12/2018 "nothing in 5 yrs; never drank much"  . Drug use: Never  . Sexual activity: Not Currently  Lifestyle  . Physical activity:    Days per week: Not on file    Minutes per session: Not on file  . Stress: Not on file  Relationships  . Social connections:    Talks on phone: Not on file    Gets together: Not on file    Attends religious service: Not on file    Active member of club or organization: Not on file    Attends meetings of clubs or organizations: Not on file    Relationship status: Not on file  Other Topics Concern  . Not on file  Social History Narrative  . Not on file     Family History:  The patient's family history includes Cancer in his son; Heart disease in his father; Kidney disease in his mother; Suicidality in his son.   ROS:   Please see the history of present illness.    ROS all other systems are reviewed and are negative  PHYSICAL EXAM:   VS:  BP 120/74 (BP Location: Left Arm, Patient Position: Sitting, Cuff Size: Normal)   Pulse 73   Ht 6' (1.829 m)   Wt 204 lb (92.5 kg)   BMI 27.67 kg/m      General: Alert, oriented x3, no distress,  Head: no evidence of  trauma, PERRL, EOMI, no exophtalmos or lid lag, no myxedema, no xanthelasma; normal ears, nose and oropharynx Neck: normal jugular venous pulsations and no hepatojugular reflux; brisk carotid pulses without delay and no carotid bruits Chest: clear to auscultation, no signs of consolidation by percussion or palpation, normal fremitus, symmetrical and full respiratory excursions.  Healthy pacemaker site. Cardiovascular: normal position and quality of the apical impulse, regular rhythm, normal first and second heart sounds, no murmurs, rubs or gallops Abdomen: no tenderness or distention, no masses by palpation, no abnormal pulsatility or arterial bruits, normal bowel sounds, no hepatosplenomegaly Extremities: no clubbing, cyanosis, symmetrical 1-2+ pitting edema above the ankles bilaterally; 2+ radial, ulnar and brachial pulses bilaterally; 2+ right femoral, posterior tibial and dorsalis pedis pulses; 2+ left femoral, posterior tibial and dorsalis pedis pulses; no subclavian or femoral bruits.  Resolving ecchymosis left wrist. Neurological: grossly nonfocal Psych: Normal mood and affect   Wt Readings from Last 3 Encounters:  11/19/18 204 lb (92.5 kg)  11/05/18 212 lb 12.8 oz (96.5 kg)  11/03/18 215 lb (97.5 kg)    Studies/Labs Reviewed:   EKG:  EKG is not ordered today.    Recent Labs: 08/12/2018: TSH 2.331 08/30/2018: Magnesium 1.8 10/04/2018: Hemoglobin 11.0; Platelets 244 10/27/2018: ALT 9; BNP 210.6; BUN 35; Creatinine, Ser 2.82; Potassium 3.8; Sodium 138   Lipid Panel    Component Value Date/Time   CHOL 143 06/21/2018 1100   TRIG 100 06/21/2018 1100   TRIG 108 07/06/2014 0936   HDL 44 06/21/2018 1100   HDL 35 (L) 07/06/2014 0936   CHOLHDL 3.3 06/21/2018 1100   LDLCALC 79 06/21/2018 1100   LDLCALC 83 07/06/2014 0936     ASSESSMENT:    1. Chronic combined systolic and diastolic heart failure (HCC)   2. Paroxysmal atrial fibrillation ([place])   3. Encounter for monitoring  amiodarone therapy   4. Dilated cardiomyopathy ([place])   5. Biventricular cardiac pacemaker in situ   6. Essential hypertension   7. CKD (chronic kidney disease) stage 4, GFR 15-29 ml/min (HCC)   8. Hypercholesterolemia   9. Controlled type 2 diabetes mellitus with stage 4 chronic kidney disease, without long-term current use of insulin ([place])   10. Sarcoidosis     PLAN:  In order of problems listed above:  1. CHF: He is mildly hypervolemic today, but is approaching his "dry weight" (203 pounds or less on his home scale).   RAAS inhibitors are contraindicated with his degree of kidney failure.  2. AFib: Not detected since device implantation.  On Eliquis dose adjusted for age and renal dysfunction.  On amiodarone.   3. Amiodarone: Will need liver function tests and thyroid function tests every 6 months. LFTs most recently checked 10/27/2018, TSH 08/12/2018. 4. Cardiomyopathy: The cause of the cardiomyopathy is not immediately clear, but need to keep in mind the possibility that it could be related to sarcoidosis.  Aortic and coronary artery vascular calcifications are clearly present on his CT chest, but not unexpected in an 85 year old.  He is not a great candidate for either invasive coronary angiography or coronary CT angiography due to advanced chronic kidney disease.  We will repeat an echo when he is a little more mobile.  His knee problems really slow him down right now..  If EF is still low consider Lexiscan Myoview.  Cardiac MRI will be the optimal diagnostic test for possible cardiac sarcoidosis, but we are unable to administer gadolinium with his renal function. 5. CRT-P: Normally functioning device with  98% biventricular pacing and clinical improvement.  He did have some diaphragmatic stimulation in the past but he did not complain of this today. 6. HTN: Well-controlled 7. CKD 4: New creatinine baseline seems to be 2.8 (GFR 20).  He has significant proteinuria.  This was presumed to be  secondary to diabetes and hypertension, but Dr. Joelyn Oms reports in his notes that retrospectively he now wonders whether there may have been a connection with a diagnosis of sarcoidosis.  There was no obvious improvement during treatment with steroids. 8. HLP: Target LDL under 100 due to diabetes mellitus, under 70 if we discover significant CAD, LDL was very close to target at 79. 9. DM:  Avoid Actos. 10. Pulmonary sarcoidosis    Medication Adjustments/Labs and Tests Ordered: Current medicines are reviewed at length with the patient today.  Concerns regarding medicines are outlined above.  Medication changes, Labs and Tests ordered today are listed in the Patient Instructions below. Patient Instructions  Medication Instructions:  Dr Sallyanne Kuster recommends that you continue on your current medications as directed. Please refer to the Current Medication list given to you today.  If you need a refill on your cardiac medications before your next appointment, please call your pharmacy.   Follow-Up: At [facility], you and your health needs are our priority.  As part of our continuing mission to provide you with exceptional heart care, we have created designated Provider Care Teams.  These Care Teams include your primary Cardiologist (physician) and Advanced Practice Providers (APPs -  Physician Assistants and Nurse Practitioners) who all work together to provide you with the care you need, when you need it. You will need a follow up appointment in 3 months. You may see Sanda Klein, MD or one of the following Advanced Practice Providers on your designated Care Team: [place], [place] . Fabian Sharp, PA-C    Signed, Sanda Klein, MD  11/20/2018 1:31 PM    [facility] [address], [place], [place]  [address] Phone: [phone]; Fax: [phone]

## 2018-11-19 NOTE — Patient Instructions (Signed)
Medication Instructions:  Dr Sallyanne Kuster recommends that you continue on your current medications as directed. Please refer to the Current Medication list given to you today.  If you need a refill on your cardiac medications before your next appointment, please call your pharmacy.   Follow-Up: At [facility], you and your health needs are our priority.  As part of our continuing mission to provide you with exceptional heart care, we have created designated Provider Care Teams.  These Care Teams include your primary Cardiologist (physician) and Advanced Practice Providers (APPs -  Physician Assistants and Nurse Practitioners) who all work together to provide you with the care you need, when you need it. You will need a follow up appointment in 3 months. You may see Sanda Klein, MD or one of the following Advanced Practice Providers on your designated Care Team: [place], [place] . Fabian Sharp, PA-C

## 2018-11-20 DIAGNOSIS — Z950 Presence of cardiac pacemaker: Secondary | ICD-10-CM | POA: Insufficient documentation

## 2018-11-30 ENCOUNTER — Ambulatory Visit (INDEPENDENT_AMBULATORY_CARE_PROVIDER_SITE_OTHER): Payer: PPO | Admitting: Family Medicine

## 2018-11-30 ENCOUNTER — Encounter: Payer: Self-pay | Admitting: Family Medicine

## 2018-11-30 VITALS — BP 133/85 | HR 74 | Temp 97.4°F | Ht 72.0 in | Wt 201.0 lb

## 2018-11-30 DIAGNOSIS — E118 Type 2 diabetes mellitus with unspecified complications: Secondary | ICD-10-CM | POA: Diagnosis not present

## 2018-11-30 DIAGNOSIS — Z0000 Encounter for general adult medical examination without abnormal findings: Secondary | ICD-10-CM | POA: Diagnosis not present

## 2018-11-30 DIAGNOSIS — N400 Enlarged prostate without lower urinary tract symptoms: Secondary | ICD-10-CM | POA: Diagnosis not present

## 2018-11-30 DIAGNOSIS — R531 Weakness: Secondary | ICD-10-CM | POA: Diagnosis not present

## 2018-11-30 DIAGNOSIS — R609 Edema, unspecified: Secondary | ICD-10-CM | POA: Diagnosis not present

## 2018-11-30 DIAGNOSIS — C3411 Malignant neoplasm of upper lobe, right bronchus or lung: Secondary | ICD-10-CM

## 2018-11-30 DIAGNOSIS — I5040 Unspecified combined systolic (congestive) and diastolic (congestive) heart failure: Secondary | ICD-10-CM

## 2018-11-30 DIAGNOSIS — E559 Vitamin D deficiency, unspecified: Secondary | ICD-10-CM

## 2018-11-30 DIAGNOSIS — D631 Anemia in chronic kidney disease: Secondary | ICD-10-CM | POA: Diagnosis not present

## 2018-11-30 DIAGNOSIS — N2581 Secondary hyperparathyroidism of renal origin: Secondary | ICD-10-CM | POA: Diagnosis not present

## 2018-11-30 DIAGNOSIS — I10 Essential (primary) hypertension: Secondary | ICD-10-CM | POA: Diagnosis not present

## 2018-11-30 DIAGNOSIS — N184 Chronic kidney disease, stage 4 (severe): Secondary | ICD-10-CM

## 2018-11-30 DIAGNOSIS — N183 Chronic kidney disease, stage 3 (moderate): Secondary | ICD-10-CM | POA: Diagnosis not present

## 2018-11-30 DIAGNOSIS — N189 Chronic kidney disease, unspecified: Secondary | ICD-10-CM | POA: Diagnosis not present

## 2018-11-30 LAB — URINALYSIS, COMPLETE
Bilirubin, UA: NEGATIVE
KETONES UA: NEGATIVE
LEUKOCYTES UA: NEGATIVE
Nitrite, UA: NEGATIVE
Specific Gravity, UA: 1.02 (ref 1.005–1.030)
Urobilinogen, Ur: 0.2 mg/dL (ref 0.2–1.0)
pH, UA: 5.5 (ref 5.0–7.5)

## 2018-11-30 LAB — MICROSCOPIC EXAMINATION
Bacteria, UA: NONE SEEN
Renal Epithel, UA: NONE SEEN /hpf

## 2018-11-30 NOTE — Patient Instructions (Addendum)
Medicare Annual Wellness Visit  [facility] and the medical providers at [facility] strive to bring you the best medical care.  In doing so we not only want to address your current medical conditions and concerns but also to detect new conditions early and prevent illness, disease and health-related problems.    Medicare offers a yearly Wellness Visit which allows our clinical staff to assess your need for preventative services including immunizations, lifestyle education, counseling to decrease risk of preventable diseases and screening for fall risk and other medical concerns.    This visit is provided free of charge (no copay) for all Medicare recipients. The clinical pharmacists at [facility] have begun to conduct these Wellness Visits which will also include a thorough review of all your medications.    As you primary medical provider recommend that you make an appointment for your Annual Wellness Visit if you have not done so already this year.  You may set up this appointment before you leave today or you may call back ([phone]) and schedule an appointment.  Please make sure when you call that you mention that you are scheduling your Annual Wellness Visit with the clinical pharmacist so that the appointment may be made for the proper length of time.     Continue current medications. Continue good therapeutic lifestyle changes which include good diet and exercise. Fall precautions discussed with patient. If an FOBT was given today- please return it to our front desk. If you are over 60 years old - you may need Prevnar 87 or the adult Pneumonia vaccine.  **Flu shots are available--- please call and schedule a FLU-CLINIC appointment**  After your visit with [place] today you will receive a survey in the mail or online from [employer] regarding your care with [place]. Please take a moment to fill this out. Your feedback is very  important to [place] as you can help [place] better understand your patient needs as well as improve your experience and satisfaction. WE CARE ABOUT YOU!!!   Continue to follow-up with pulmonology nephrology cardiology and orthopedics as planned You have done amazingly well with all the issues that are going on and less just hope that the CT scan of the chest that gets repeated will allow you to reduce the prednisone so that your sugars will be running so high We will call you with lab work as soon as those results are returned Continue to monitor blood sugars closely at home
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
## 2018-12-01 ENCOUNTER — Telehealth: Payer: Self-pay | Admitting: Family Medicine

## 2018-12-01 LAB — BMP8+EGFR
BUN/Creatinine Ratio: 19 (ref 10–24)
BUN: 60 mg/dL — ABNORMAL HIGH (ref 8–27)
CO2: 21 mmol/L (ref 20–29)
CREATININE: 3.11 mg/dL — AB (ref 0.76–1.27)
Calcium: 9.1 mg/dL (ref 8.6–10.2)
Chloride: 98 mmol/L (ref 96–106)
GFR calc Af Amer: 20 mL/min/{1.73_m2} — ABNORMAL LOW (ref >59)
GFR calc non Af Amer: 17 mL/min/{1.73_m2} — ABNORMAL LOW (ref >59)
Glucose: 118 mg/dL — ABNORMAL HIGH (ref 65–99)
Potassium: 4.8 mmol/L (ref 3.5–5.2)
Sodium: 137 mmol/L (ref 134–144)

## 2018-12-01 LAB — CBC WITH DIFFERENTIAL/PLATELET
Basophils Absolute: 0 10*3/uL (ref 0.0–0.2)
Basos: 0 %
EOS (ABSOLUTE): 0 10*3/uL (ref 0.0–0.4)
EOS: 1 %
HEMATOCRIT: 32.4 % — AB (ref 37.5–51.0)
Hemoglobin: 10.9 g/dL — ABNORMAL LOW (ref 13.0–17.7)
IMMATURE GRANULOCYTES: 2 %
Immature Grans (Abs): 0.1 10*3/uL (ref 0.0–0.1)
Lymphocytes Absolute: 0.4 10*3/uL — ABNORMAL LOW (ref 0.7–3.1)
Lymphs: 6 %
MCH: 28.2 pg (ref 26.6–33.0)
MCHC: 33.6 g/dL (ref 31.5–35.7)
MCV: 84 fL (ref 79–97)
MONOS ABS: 0.4 10*3/uL (ref 0.1–0.9)
Monocytes: 5 %
Neutrophils Absolute: 5.8 10*3/uL (ref 1.4–7.0)
Neutrophils: 86 %
Platelets: 261 10*3/uL (ref 150–450)
RBC: 3.86 x10E6/uL — ABNORMAL LOW (ref 4.14–5.80)
RDW: 14.9 % (ref 11.6–15.4)
WBC: 6.8 10*3/uL (ref 3.4–10.8)

## 2018-12-01 LAB — HEPATIC FUNCTION PANEL
ALT: 9 IU/L (ref 0–44)
AST: 9 IU/L (ref 0–40)
Albumin: 3.9 g/dL (ref 3.5–4.7)
Alkaline Phosphatase: 55 IU/L (ref 39–117)
Bilirubin Total: 0.3 mg/dL (ref 0.0–1.2)
Bilirubin, Direct: 0.1 mg/dL (ref 0.00–0.40)
Total Protein: 6.6 g/dL (ref 6.0–8.5)

## 2018-12-01 LAB — LIPID PANEL
Chol/HDL Ratio: 4.1 ratio (ref 0.0–5.0)
Cholesterol, Total: 169 mg/dL (ref 100–199)
HDL: 41 mg/dL (ref >39)
LDL Calculated: 103 mg/dL — ABNORMAL HIGH (ref 0–99)
Triglycerides: 123 mg/dL (ref 0–149)
VLDL Cholesterol Cal: 25 mg/dL (ref 5–40)

## 2018-12-01 LAB — VITAMIN D 25 HYDROXY (VIT D DEFICIENCY, FRACTURES): Vit D, 25-Hydroxy: 56.6 ng/mL (ref 30.0–100.0)

## 2018-12-01 NOTE — Telephone Encounter (Signed)
Line busy

## 2018-12-02 DIAGNOSIS — S52522D Torus fracture of lower end of left radius, subsequent encounter for fracture with routine healing: Secondary | ICD-10-CM | POA: Diagnosis not present

## 2018-12-16 DIAGNOSIS — M1712 Unilateral primary osteoarthritis, left knee: Secondary | ICD-10-CM | POA: Diagnosis not present

## 2018-12-16 DIAGNOSIS — M1711 Unilateral primary osteoarthritis, right knee: Secondary | ICD-10-CM | POA: Diagnosis not present

## 2018-12-20 ENCOUNTER — Ambulatory Visit (INDEPENDENT_AMBULATORY_CARE_PROVIDER_SITE_OTHER): Payer: PPO

## 2018-12-20 ENCOUNTER — Telehealth: Payer: Self-pay

## 2018-12-20 DIAGNOSIS — I5042 Chronic combined systolic (congestive) and diastolic (congestive) heart failure: Secondary | ICD-10-CM

## 2018-12-20 DIAGNOSIS — N183 Chronic kidney disease, stage 3 (moderate): Secondary | ICD-10-CM | POA: Diagnosis not present

## 2018-12-20 DIAGNOSIS — R809 Proteinuria, unspecified: Secondary | ICD-10-CM | POA: Diagnosis not present

## 2018-12-20 DIAGNOSIS — Z950 Presence of cardiac pacemaker: Secondary | ICD-10-CM | POA: Diagnosis not present

## 2018-12-20 DIAGNOSIS — D631 Anemia in chronic kidney disease: Secondary | ICD-10-CM | POA: Diagnosis not present

## 2018-12-20 DIAGNOSIS — N2581 Secondary hyperparathyroidism of renal origin: Secondary | ICD-10-CM | POA: Diagnosis not present

## 2018-12-20 DIAGNOSIS — D869 Sarcoidosis, unspecified: Secondary | ICD-10-CM | POA: Diagnosis not present

## 2018-12-20 DIAGNOSIS — I129 Hypertensive chronic kidney disease with stage 1 through stage 4 chronic kidney disease, or unspecified chronic kidney disease: Secondary | ICD-10-CM | POA: Diagnosis not present

## 2018-12-20 NOTE — Telephone Encounter (Signed)
Remote ICM transmission received.  Attempted call to patient regarding ICM remote transmission and left message to return call   

## 2018-12-20 NOTE — Progress Notes (Signed)
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  Left message to return call. Transmission reviewed.    Thoracic impedance abnormal suggesting fluid accumulation since 11/25/2018.  Prescribed dosage: Torsemide20 mg take4tablets (80 mg total) daily.   Labs: 11/30/2018 Creatinine 3.11, BUN 60, Potassium 4.8, Sodium 137, eGFR 17-20 10/04/2018 Creatinine 2.82, BUN 43, Potassium 4.0, Sodium 139, eGFR 19-22 09/24/2018 Creatinine 2.82, BUN 31, Potassium 4.0, Sodium 138, eGFR 19-22 09/02/2018 Creatinine2.73, BUN38, Potassium3.6, Sodium139, eGFR20-23 08/30/2018 Creatinine2.80, BUN40, Potassium3.9, Sodium140, eGFR20-23  08/17/2018 Creatinine2.57, BUN40, Potassium3.7, Sodium135, [id number]  08/16/2018 Creatinine2.84, BUN38, Potassium3.7, [id number], [id number]  08/15/2018 Creatinine3.31, BUN40, Potassium3.7, Sodium135, [id number] A complete set of results can be found in Results Review.  Recommendations: Unable to reach.  Follow-up plan: ICM clinic phone appointment on 12/28/2018 to recheck fluid levels.  Office appointment scheduled 02/22/2019 with Dr. Sallyanne Kuster.  Copy of ICM check sent to Dr Lovena Le and Dr. Sallyanne Kuster.   3 month ICM trend: 12/20/2018    1 Year ICM trend:       Rosalene Billings, RN 12/20/2018 2:15 PM

## 2018-12-20 NOTE — Progress Notes (Signed)
Patient returned call.  He stated he feels better and there is no swelling in his ankles.  Weight has dropped from 215 lbs to 199 lbs.  He visited Nephrologist today and was told eventually he will need dialysis but not now.  He said Dr Joelyn Oms is going to be prepping him for when dialysis is needed.   He reports Dr Marylou Flesher was concerned that patient is taking Potassium 40 mEq daily and I advised potassium is not on medication list.  Reviewed progress notes and 08/18/2019 hospital discharge notes and appears Potassium was stopped at discharge.  Unable to locate in epic notes that Potassium was restarted.    Advised copy of report will be sent to Dr Sallyanne Kuster regarding Corvue reading and that he has continued to take Potassium but it is not on his med list since 08/17/18 discharge.  Will call back with any recommendations.  Recheck Corvue 2/11.

## 2018-12-21 NOTE — Progress Notes (Signed)
Ruben Conner, I did finally reach him and he seems to be doing pretty well.  He has no shortness of breath with household activities and has no edema.  His weight is 199 pounds, for the first time in a long time under 200. He is flirting with the need for hemodialysis, so I think we will hold off on adjusting his diuretics. I asked him to call us if he develops shortness of breath, pedal edema or if his weight reaches 203 pounds.  That seemed to be a good dry weight for him in the past. Please do another core view download on him in a couple of weeks. Thanks [employer]

## 2018-12-22 NOTE — Progress Notes (Signed)
Next ICM remote transmission scheduled for 01/03/2019 to recheck fluid levels.

## 2018-12-24 ENCOUNTER — Telehealth: Payer: Self-pay | Admitting: Family Medicine

## 2018-12-24 NOTE — Telephone Encounter (Signed)
I do not know about iron sulphate- magnesium may help

## 2018-12-24 NOTE — Telephone Encounter (Signed)
Patient aware.

## 2018-12-24 NOTE — Telephone Encounter (Signed)
Pt thinks he has RLS called pharmacy and asked if there was something he could take OTC they told him iron sulphate. Pt just checking to see if he can take that. Please advise.

## 2018-12-28 ENCOUNTER — Ambulatory Visit (INDEPENDENT_AMBULATORY_CARE_PROVIDER_SITE_OTHER): Payer: PPO

## 2018-12-28 DIAGNOSIS — I5042 Chronic combined systolic (congestive) and diastolic (congestive) heart failure: Secondary | ICD-10-CM

## 2018-12-28 DIAGNOSIS — Z950 Presence of cardiac pacemaker: Secondary | ICD-10-CM

## 2018-12-28 MED ORDER — TORSEMIDE 20 MG PO TABS: ORAL_TABLET | ORAL | 2 refills | Status: AC

## 2018-12-28 MED ORDER — POTASSIUM CHLORIDE CRYS ER 20 MEQ PO TBCR: EXTENDED_RELEASE_TABLET | ORAL | 3 refills | Status: AC

## 2018-12-28 NOTE — Progress Notes (Signed)
Spoke with patient.  Confirmed weight 199 lbs today.  Advised Dr Sallyanne Kuster provided the following recommendations.  Skip one day of diuretics and then resume next day at Torsemide 40 mg daily. Try to adjust diuretics to keep weight 200-205 lb.  Advised after he follow the above instructions then to follow Torsemide sliding scale.   No diuretics if under 200 lb with no KCl Torsemide 40 mg daily if weight is 200-205 lb, with KCL 20 mEq daily Torsemide 80 mg if weight if >205 lb, with KCl 40 mEQ daily   BMET on Thursday 2/13 to figure out this potassium business.  He wrote down instructions and able to repeat back correctly.

## 2018-12-28 NOTE — Progress Notes (Signed)
So, today's weight is clearly too dry. (is the 199 lb listed as "last weight" his home weight today or is it from the last office visit?)   Seems to do best around 203 lb. Skip one day of diuretics and then resume next day at Torsemide 40 mg daily. Try to adjust diuretics to keep weight 200-205 lb.  Let's try this:  No diuretics if under 200 lb with no KCl Torsemide 40 mg daily if weight is 200-205 lb, with KCL 20 mEq daily Torsemide 80 mg if weight if >205 lb, with KCl 40 mEQ daily   BMET on Thursday 2/13 to figure out this potassium business. (I will be out of town starting 2/14 but someone will be covering my in-basket)

## 2018-12-28 NOTE — Progress Notes (Signed)
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  Pt called today and symptomatic with lightheadedness, overall weakness and shortness of breath.  He said he is using a Locatelli today which he has not been using in the last few weeks.     Report: Thoracic impedance abnormal suggesting dryness starting 12/23/2018.   Prescribed: Torsemide20 mg take4tablets (80 mg total) daily. Patient has been taking Potassium 40 mEq daily. It is not listed on Epic and he said his nephrologist Dr Joelyn Oms told him it is not on his med list either.    Labs: 11/30/2018 Creatinine 3.11, BUN 60, Potassium 4.8, Sodium 137, eGFR 17-20 10/04/2018 Creatinine 2.82, BUN 43, Potassium 4.0, Sodium 139, eGFR 19-22 09/24/2018 Creatinine 2.82, BUN 31, Potassium 4.0, Sodium 138, eGFR 19-22 09/02/2018 Creatinine2.73, BUN38, Potassium3.6, Sodium139, eGFR20-23 08/30/2018 Creatinine2.80, BUN40, Potassium3.9, Sodium140, eGFR20-23  08/17/2018 Creatinine2.57, BUN40, Potassium3.7, Sodium135, [id number]  08/16/2018 Creatinine2.84, BUN38, Potassium3.7, [id number], [id number]  08/15/2018 Creatinine3.31, BUN40, Potassium3.7, Sodium135, [id number] A complete set of results can be found in Results Review.  Recommendations:  Advised to increase fluid intake today and tomorrow.  Reviewed progress notes and 08/18/2019 hospital discharge notes and appears Potassium was stopped at discharge.  Unable to locate in epic notes that Potassium was restarted.    Advised would send copy to Dr Sallyanne Kuster 08/17/2018 regarding the report, symptoms and any recommendations on Potassium.  Follow-up plan: ICM clinic phone appointment on 01/04/2019 to recheck fluid levels.    Office appt 02/22/2019 with Dr. Sallyanne Kuster.    Copy of ICM check sent to Dr. Lovena Le and Dr Sallyanne Kuster.   3 month ICM trend: 12/28/2018    1 Year ICM trend:       Rosalene Billings, RN 12/28/2018 10:51 AM

## 2018-12-30 ENCOUNTER — Other Ambulatory Visit: Payer: PPO

## 2018-12-30 DIAGNOSIS — I5042 Chronic combined systolic (congestive) and diastolic (congestive) heart failure: Secondary | ICD-10-CM | POA: Diagnosis not present

## 2018-12-31 ENCOUNTER — Telehealth: Payer: Self-pay

## 2018-12-31 ENCOUNTER — Other Ambulatory Visit: Payer: Self-pay

## 2018-12-31 DIAGNOSIS — I10 Essential (primary) hypertension: Secondary | ICD-10-CM

## 2018-12-31 DIAGNOSIS — I5042 Chronic combined systolic (congestive) and diastolic (congestive) heart failure: Secondary | ICD-10-CM

## 2018-12-31 LAB — BASIC METABOLIC PANEL
BUN / CREAT RATIO: 11 (ref 10–24)
BUN: 36 mg/dL — ABNORMAL HIGH (ref 8–27)
CO2: 23 mmol/L (ref 20–29)
Calcium: 10.2 mg/dL (ref 8.6–10.2)
Chloride: 101 mmol/L (ref 96–106)
Creatinine, Ser: 3.19 mg/dL (ref 0.76–1.27)
GFR calc non Af Amer: 17 mL/min/{1.73_m2} — ABNORMAL LOW (ref >59)
GFR, EST AFRICAN AMERICAN: 19 mL/min/{1.73_m2} — AB (ref >59)
Glucose: 133 mg/dL — ABNORMAL HIGH (ref 65–99)
Potassium: 4.7 mmol/L (ref 3.5–5.2)
Sodium: 141 mmol/L (ref 134–144)

## 2018-12-31 NOTE — Telephone Encounter (Signed)
Received a critical creatinine of 3.19 from [facility]. Result review by Dr. Servando Salina) who suggest pt follow up with Nephrologist. Pt updated and states he will call to make an appointment.

## 2019-01-03 ENCOUNTER — Ambulatory Visit (INDEPENDENT_AMBULATORY_CARE_PROVIDER_SITE_OTHER): Payer: PPO

## 2019-01-03 DIAGNOSIS — I420 Dilated cardiomyopathy: Secondary | ICD-10-CM | POA: Diagnosis not present

## 2019-01-03 LAB — CUP PACEART REMOTE DEVICE CHECK
Date Time Interrogation Session: 20200225095322
Implantable Lead Implant Date: 20190715
Implantable Lead Implant Date: 20190715
Implantable Lead Implant Date: 20190715
Implantable Lead Location: 753858
Implantable Lead Location: 753859
Implantable Lead Model: 4398
Implantable Pulse Generator Implant Date: 20190715
MDC IDC LEAD LOCATION: 753860
Pulse Gen Serial Number: 9460801

## 2019-01-04 ENCOUNTER — Ambulatory Visit (INDEPENDENT_AMBULATORY_CARE_PROVIDER_SITE_OTHER): Payer: PPO

## 2019-01-04 DIAGNOSIS — Z950 Presence of cardiac pacemaker: Secondary | ICD-10-CM

## 2019-01-04 DIAGNOSIS — I5042 Chronic combined systolic (congestive) and diastolic (congestive) heart failure: Secondary | ICD-10-CM

## 2019-01-04 NOTE — Progress Notes (Signed)
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  Pt's feet are more puffy.  He has been drinking V8 Juice and advised to stop drinking that juice due to very high sodium amount.    He is not following Torsemide sliding scale and has been taking Torsemide 80 mg daily with 20 mEq of potassium.      Report: Thoracic impedance abnormal suggesting fluid accumulation since 12/31/2018.   Prescribed sliding scale Torsemide by Dr Sallyanne Kuster No diuretics if under 200 lb with no KCl    Torsemide 40 mg daily if weight is 200-205 lb, with KCL 20 mEq daily Torsemide 80 mg if weight if >205 lb, with KCl 40 mEq daily  Labs: 12/30/2018 Creatinine 3.19, BUN 36, Potassium 4.7, Sodium 141, GFR 17-19 11/30/2018 Creatinine 3.11, BUN 60, Potassium 4.8, Sodium 137, GFR 17-20 10/04/2018 Creatinine 2.82, BUN 43, Potassium 4.0, Sodium 139, GFR 19-22 09/24/2018 Creatinine 2.82, BUN 31, Potassium 4.0, Sodium 138, GFR 19-22 09/02/2018 Creatinine2.73, BUN38, Potassium3.6, Sodium139, GFR20-23 08/30/2018 Creatinine2.80, BUN40, Potassium3.9, [id number], [id number]  08/17/2018 Creatinine2.57, BUN40, Potassium3.7, Sodium135, GFR21-24  08/16/2018 Creatinine2.84, BUN38, Potassium3.7, [id number], [id number]  08/15/2018 Creatinine3.31, BUN40, Potassium3.7, [id number], [id number] A complete set of results can be found in Results Review.  Recommendations:  Advised he should be following Torsemide sliding scale as ordered by Dr Sallyanne Kuster and reviewed it with him.  Advised to avoid juices and foods high in salt and explained diet is very important especially if the sliding scale says he should not  have any diuretics if he is <200 lbs which he reported he is at 198 lbs.    Follow-up plan: ICM clinic phone appointment on 01/12/2019 to recheck fluid levels.   Office appt 02/22/2019 with Dr. Sallyanne Kuster.    Copy of ICM check sent to Dr. Lovena Le and Dr Sallyanne Kuster for review.   3 month ICM trend: 01/03/2019    1 Year ICM trend:       Rosalene Billings, RN 01/04/2019 9:29 AM

## 2019-01-11 LAB — CUP PACEART REMOTE DEVICE CHECK
Date Time Interrogation Session: 20200224164842
Implantable Lead Implant Date: 20190715
Implantable Lead Location: 753858
Implantable Lead Location: 753859
Implantable Lead Location: 753860
Implantable Pulse Generator Implant Date: 20190715
MDC IDC LEAD IMPLANT DT: 20190715
MDC IDC LEAD IMPLANT DT: 20190715
Pulse Gen Serial Number: 9460801

## 2019-01-12 ENCOUNTER — Ambulatory Visit: Payer: PPO | Admitting: Family Medicine

## 2019-01-12 ENCOUNTER — Ambulatory Visit (INDEPENDENT_AMBULATORY_CARE_PROVIDER_SITE_OTHER): Payer: PPO

## 2019-01-12 DIAGNOSIS — Z950 Presence of cardiac pacemaker: Secondary | ICD-10-CM

## 2019-01-12 DIAGNOSIS — I5042 Chronic combined systolic (congestive) and diastolic (congestive) heart failure: Secondary | ICD-10-CM

## 2019-01-12 NOTE — Progress Notes (Signed)
Remote pacemaker transmission.   

## 2019-01-13 ENCOUNTER — Other Ambulatory Visit: Payer: PPO

## 2019-01-14 ENCOUNTER — Telehealth: Payer: Self-pay

## 2019-01-14 DIAGNOSIS — M25562 Pain in left knee: Secondary | ICD-10-CM | POA: Diagnosis not present

## 2019-01-14 DIAGNOSIS — M1712 Unilateral primary osteoarthritis, left knee: Secondary | ICD-10-CM | POA: Diagnosis not present

## 2019-01-14 NOTE — Progress Notes (Signed)
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  Left message to return call. Transmission reviewed.     Report: Thoracic impedance returned to normal 01/10/2019.   Prescribed sliding scale Torsemide by Dr Sallyanne Kuster No diuretics if under 200 lb with no KCl Torsemide 40 mg daily if weight is 200-205 lb, with KCL 20 mEq daily Torsemide 80 mg if weight if >205 lb, with KCl 40 mEq daily  Labs: 12/30/2018 Creatinine 3.19, BUN 36, Potassium 4.7, Sodium 141, GFR 17-19 11/30/2018 Creatinine 3.11, BUN 60, Potassium 4.8, Sodium 137, GFR 17-20 10/04/2018 Creatinine 2.82, BUN 43, Potassium 4.0, Sodium 139, GFR 19-22 09/24/2018 Creatinine 2.82, BUN 31, Potassium 4.0, Sodium 138, GFR 19-22 09/02/2018 Creatinine2.73, BUN38, Potassium3.6, Sodium139, GFR20-23 08/30/2018 Creatinine2.80, BUN40, Potassium3.9, [id number], [id number]  08/17/2018 Creatinine2.57, BUN40, Potassium3.7, Sodium135, GFR21-24  08/16/2018 Creatinine2.84, BUN38, Potassium3.7, [id number], [id number]  08/15/2018 Creatinine3.31, BUN40, Potassium3.7, [id number], [id number] A complete set of results can be found in Results Review.  Recommendations:Unable to reach.  Follow-up plan: ICM clinic phone appointment on3/23/2020. Office appt 02/22/2019 with Dr.Croitoru.   Copy of ICM check sent to Dr.Taylor and Dr Sallyanne Kuster for review.    3 month ICM trend: 01/12/2019    1 Year ICM trend:       Rosalene Billings, RN 01/14/2019 12:01 PM

## 2019-01-14 NOTE — Progress Notes (Signed)
Patient returned call.  He stated he is doing better and weighing daily.  He was 199 lbs yesterday but 205 lbs today.  He is taking the sliding scale Torsemide and since weight is 205, he is taking Torsemide 80 mg today and 40 mEq of KCL today.  He reported feet were puffy this morning but fine this afternoon.  Advised to continue daily weights and sliding scale Torsemide.  Encouraged to call if weight continues to increase or he has other symptoms.  Next ICM remote transmission 02/07/2019.

## 2019-01-14 NOTE — Telephone Encounter (Signed)
Remote ICM transmission received.  Attempted call to patient regarding ICM remote transmission and left message, per DPR, to return call.    

## 2019-01-14 NOTE — Progress Notes (Signed)
Ty! MCr

## 2019-01-20 ENCOUNTER — Other Ambulatory Visit: Payer: Self-pay | Admitting: Family Medicine

## 2019-01-21 DIAGNOSIS — M1712 Unilateral primary osteoarthritis, left knee: Secondary | ICD-10-CM | POA: Diagnosis not present

## 2019-01-28 DIAGNOSIS — M1712 Unilateral primary osteoarthritis, left knee: Secondary | ICD-10-CM | POA: Diagnosis not present

## 2019-01-28 DIAGNOSIS — M25562 Pain in left knee: Secondary | ICD-10-CM | POA: Diagnosis not present

## 2019-02-07 ENCOUNTER — Other Ambulatory Visit: Payer: Self-pay

## 2019-02-07 ENCOUNTER — Ambulatory Visit (INDEPENDENT_AMBULATORY_CARE_PROVIDER_SITE_OTHER): Payer: PPO

## 2019-02-07 DIAGNOSIS — Z950 Presence of cardiac pacemaker: Secondary | ICD-10-CM

## 2019-02-07 DIAGNOSIS — I5042 Chronic combined systolic (congestive) and diastolic (congestive) heart failure: Secondary | ICD-10-CM

## 2019-02-08 ENCOUNTER — Other Ambulatory Visit: Payer: Self-pay | Admitting: *Deleted

## 2019-02-08 MED ORDER — GLUCOSE BLOOD VI STRP: ORAL_STRIP | 3 refills | Status: AC

## 2019-02-09 ENCOUNTER — Telehealth: Payer: Self-pay | Admitting: Family Medicine

## 2019-02-09 ENCOUNTER — Ambulatory Visit: Payer: PPO | Admitting: Family Medicine

## 2019-02-09 NOTE — Progress Notes (Signed)
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  He had a fall last week resulting in hitting his head and face is bruised.  He is discussing with his children about moving into assisted living with his wife because he is no longer able to take care of himself or wife.  He said he is "just not with it".  He reports not eating very well.   Patient has not been following Torsemide sliding scale.  Advised if weight is below 200 he should not take any Torsemide or potassium.  He said okay but not sure if understands.    Report: Thoracic impedance is trending up suggesting dryness which could be related to patient is taking Torsemide daily with potassium.  Unsure how much he is taking.  Patient having difficulty answering questions.  Prescribed sliding scale Torsemide by Dr Sallyanne Kuster No diuretics if under 200 lb with no KCl Torsemide 40 mg daily if weight is 200-205 lb, with KCL 20 mEq daily Torsemide 80 mg if weight if >205 lb, with KCl 40 mEqdaily  Labs: 12/30/2018 Creatinine 3.19, BUN 36, Potassium 4.7, Sodium 141, GFR 17-19 11/30/2018 Creatinine 3.11, BUN 60, Potassium 4.8, Sodium 137, GFR 17-20 10/04/2018 Creatinine 2.82, BUN 43, Potassium 4.0, Sodium 139, GFR 19-22 09/24/2018 Creatinine 2.82, BUN 31, Potassium 4.0, Sodium 138, GFR 19-22 09/02/2018 Creatinine2.73, BUN38, Potassium3.6, Sodium139, GFR20-23 08/30/2018 Creatinine2.80, BUN40, Potassium3.9, [id number], TDD22-02  08/17/2018 Creatinine2.57, BUN40, Potassium3.7, Sodium135, GFR21-24  08/16/2018 Creatinine2.84, BUN38, Potassium3.7, [id number], SEG31-51  08/15/2018 Creatinine3.31, BUN40, Potassium3.7, [id number], [id number] A  complete set of results can be found in Results Review.  Recommendations:He wanted to inform Dr Sallyanne Kuster about having to move to assisted living and he may need to cancel office appt next week and advised most visit are now over the phone due to the virus.  Concerned how patient is taking Torsemide since report suggest dryness.    Follow-up plan: ICM clinic phone appointment on4/27/2020. Office appt 02/22/2019 with Dr.Croitoru (remote transmission scheduled 4/7 in the event office visit changes to e-visit).   Copy of ICM check sent to Dr.Taylor and Dr Croitorufor review.  3 month ICM trend: 02/07/2019    1 Year ICM trend:       Rosalene Billings, RN 02/09/2019 10:23 AM

## 2019-02-09 NOTE — Telephone Encounter (Signed)
Daughter aware - appt made for [facility] (pt wanted to wait)

## 2019-02-10 ENCOUNTER — Telehealth: Payer: Self-pay

## 2019-02-10 NOTE — Progress Notes (Signed)
Thank you, Margarita Grizzle. I spoke with him and made sure that he understood the diuretic instructions. He will not take any torsemide until his weight reaches 200 lb. He will have labs with Dr. Laurance Flatten this coming week.  Malachy Mood, He has an appointment with me on 02/22/2019, but I think it is best if we make that an E-visit. He does not have a smartphone or computer and does not use e-mail, but his son Ron Agee Beeghly can help. Can we please try to re-schedule his visit as a telemedicine visit? Please send the instruction packet to [email or web address]

## 2019-02-10 NOTE — Telephone Encounter (Signed)
Spoke to patient's son Ron Agee visit information to do a e-visit sent to his email.Stated it would be too hard for father to do a web visit.He does not have internet.Stated will be better for him to do a tele visit.Tele visit scheduled 02/22/19 at 1:20 pm with Dr.Croitoru.

## 2019-02-11 NOTE — Progress Notes (Signed)
That's great. Thanks [employer]

## 2019-02-15 ENCOUNTER — Other Ambulatory Visit: Payer: Self-pay

## 2019-02-16 ENCOUNTER — Encounter: Payer: Self-pay | Admitting: Family Medicine

## 2019-02-16 ENCOUNTER — Ambulatory Visit (INDEPENDENT_AMBULATORY_CARE_PROVIDER_SITE_OTHER): Payer: PPO

## 2019-02-16 ENCOUNTER — Ambulatory Visit (INDEPENDENT_AMBULATORY_CARE_PROVIDER_SITE_OTHER): Payer: PPO | Admitting: Family Medicine

## 2019-02-16 VITALS — BP 143/72 | HR 59 | Temp 97.2°F | Ht 72.0 in | Wt 204.0 lb

## 2019-02-16 DIAGNOSIS — E118 Type 2 diabetes mellitus with unspecified complications: Secondary | ICD-10-CM

## 2019-02-16 DIAGNOSIS — R278 Other lack of coordination: Secondary | ICD-10-CM | POA: Diagnosis not present

## 2019-02-16 DIAGNOSIS — I130 Hypertensive heart and chronic kidney disease with heart failure and stage 1 through stage 4 chronic kidney disease, or unspecified chronic kidney disease: Secondary | ICD-10-CM | POA: Diagnosis not present

## 2019-02-16 DIAGNOSIS — I4821 Permanent atrial fibrillation: Secondary | ICD-10-CM | POA: Diagnosis not present

## 2019-02-16 DIAGNOSIS — N184 Chronic kidney disease, stage 4 (severe): Secondary | ICD-10-CM | POA: Diagnosis not present

## 2019-02-16 DIAGNOSIS — M1990 Unspecified osteoarthritis, unspecified site: Secondary | ICD-10-CM | POA: Diagnosis not present

## 2019-02-16 DIAGNOSIS — I252 Old myocardial infarction: Secondary | ICD-10-CM | POA: Diagnosis not present

## 2019-02-16 DIAGNOSIS — Z48815 Encounter for surgical aftercare following surgery on the digestive system: Secondary | ICD-10-CM | POA: Diagnosis not present

## 2019-02-16 DIAGNOSIS — M546 Pain in thoracic spine: Secondary | ICD-10-CM | POA: Diagnosis not present

## 2019-02-16 DIAGNOSIS — I110 Hypertensive heart disease with heart failure: Secondary | ICD-10-CM | POA: Diagnosis not present

## 2019-02-16 DIAGNOSIS — I5022 Chronic systolic (congestive) heart failure: Secondary | ICD-10-CM | POA: Diagnosis not present

## 2019-02-16 DIAGNOSIS — E039 Hypothyroidism, unspecified: Secondary | ICD-10-CM | POA: Diagnosis not present

## 2019-02-16 DIAGNOSIS — I4892 Unspecified atrial flutter: Secondary | ICD-10-CM | POA: Diagnosis not present

## 2019-02-16 DIAGNOSIS — I50812 Chronic right heart failure: Secondary | ICD-10-CM | POA: Diagnosis not present

## 2019-02-16 DIAGNOSIS — W19XXXA Unspecified fall, initial encounter: Secondary | ICD-10-CM

## 2019-02-16 DIAGNOSIS — S2242XA Multiple fractures of ribs, left side, initial encounter for closed fracture: Secondary | ICD-10-CM | POA: Diagnosis not present

## 2019-02-16 DIAGNOSIS — E1122 Type 2 diabetes mellitus with diabetic chronic kidney disease: Secondary | ICD-10-CM | POA: Diagnosis not present

## 2019-02-16 DIAGNOSIS — S0990XA Unspecified injury of head, initial encounter: Secondary | ICD-10-CM

## 2019-02-16 DIAGNOSIS — M9901 Segmental and somatic dysfunction of cervical region: Secondary | ICD-10-CM | POA: Diagnosis not present

## 2019-02-16 DIAGNOSIS — R531 Weakness: Secondary | ICD-10-CM | POA: Diagnosis not present

## 2019-02-16 DIAGNOSIS — D539 Nutritional anemia, unspecified: Secondary | ICD-10-CM | POA: Diagnosis not present

## 2019-02-16 DIAGNOSIS — S42301D Unspecified fracture of shaft of humerus, right arm, subsequent encounter for fracture with routine healing: Secondary | ICD-10-CM | POA: Diagnosis not present

## 2019-02-16 DIAGNOSIS — N289 Disorder of kidney and ureter, unspecified: Secondary | ICD-10-CM | POA: Diagnosis not present

## 2019-02-16 DIAGNOSIS — M545 Low back pain: Secondary | ICD-10-CM | POA: Diagnosis not present

## 2019-02-16 DIAGNOSIS — R0789 Other chest pain: Secondary | ICD-10-CM

## 2019-02-16 DIAGNOSIS — G4733 Obstructive sleep apnea (adult) (pediatric): Secondary | ICD-10-CM | POA: Diagnosis not present

## 2019-02-16 DIAGNOSIS — M25561 Pain in right knee: Secondary | ICD-10-CM | POA: Diagnosis not present

## 2019-02-16 DIAGNOSIS — N189 Chronic kidney disease, unspecified: Secondary | ICD-10-CM | POA: Diagnosis not present

## 2019-02-16 DIAGNOSIS — Z95810 Presence of automatic (implantable) cardiac defibrillator: Secondary | ICD-10-CM | POA: Diagnosis not present

## 2019-02-16 DIAGNOSIS — M542 Cervicalgia: Secondary | ICD-10-CM | POA: Diagnosis not present

## 2019-02-16 DIAGNOSIS — K219 Gastro-esophageal reflux disease without esophagitis: Secondary | ICD-10-CM | POA: Diagnosis not present

## 2019-02-16 DIAGNOSIS — I10 Essential (primary) hypertension: Secondary | ICD-10-CM | POA: Diagnosis not present

## 2019-02-16 DIAGNOSIS — R0781 Pleurodynia: Secondary | ICD-10-CM

## 2019-02-16 DIAGNOSIS — I5042 Chronic combined systolic (congestive) and diastolic (congestive) heart failure: Secondary | ICD-10-CM | POA: Diagnosis not present

## 2019-02-16 DIAGNOSIS — M9902 Segmental and somatic dysfunction of thoracic region: Secondary | ICD-10-CM | POA: Diagnosis not present

## 2019-02-16 DIAGNOSIS — E785 Hyperlipidemia, unspecified: Secondary | ICD-10-CM | POA: Diagnosis not present

## 2019-02-16 DIAGNOSIS — I2510 Atherosclerotic heart disease of native coronary artery without angina pectoris: Secondary | ICD-10-CM | POA: Diagnosis not present

## 2019-02-16 DIAGNOSIS — M549 Dorsalgia, unspecified: Secondary | ICD-10-CM | POA: Diagnosis not present

## 2019-02-16 DIAGNOSIS — M9903 Segmental and somatic dysfunction of lumbar region: Secondary | ICD-10-CM | POA: Diagnosis not present

## 2019-02-16 DIAGNOSIS — Z87891 Personal history of nicotine dependence: Secondary | ICD-10-CM | POA: Diagnosis not present

## 2019-02-16 DIAGNOSIS — J9610 Chronic respiratory failure, unspecified whether with hypoxia or hypercapnia: Secondary | ICD-10-CM | POA: Diagnosis not present

## 2019-02-16 DIAGNOSIS — I482 Chronic atrial fibrillation, unspecified: Secondary | ICD-10-CM | POA: Diagnosis not present

## 2019-02-16 DIAGNOSIS — J449 Chronic obstructive pulmonary disease, unspecified: Secondary | ICD-10-CM | POA: Diagnosis not present

## 2019-02-16 DIAGNOSIS — R2689 Other abnormalities of gait and mobility: Secondary | ICD-10-CM | POA: Diagnosis not present

## 2019-02-16 DIAGNOSIS — M6281 Muscle weakness (generalized): Secondary | ICD-10-CM | POA: Diagnosis not present

## 2019-02-16 DIAGNOSIS — N390 Urinary tract infection, site not specified: Secondary | ICD-10-CM | POA: Diagnosis not present

## 2019-02-16 DIAGNOSIS — M9905 Segmental and somatic dysfunction of pelvic region: Secondary | ICD-10-CM | POA: Diagnosis not present

## 2019-02-16 DIAGNOSIS — I428 Other cardiomyopathies: Secondary | ICD-10-CM | POA: Diagnosis not present

## 2019-02-16 DIAGNOSIS — R41841 Cognitive communication deficit: Secondary | ICD-10-CM | POA: Diagnosis not present

## 2019-02-16 NOTE — Patient Instructions (Addendum)
Follow head injury precautions Use Laye or cane for gait stability Continue to use Shiffman regularly Continue to make every effort to move slowly so as not to fall Continue to drink plenty of water and fluids Check blood sugars periodically Stay in as much as possible until the coronavirus subsides Use Drolet as directed Try to take deep breaths Elevate feet as much as possible to reduce swelling And use support stockings Discontinue vitamin D

## 2019-02-16 NOTE — Progress Notes (Signed)
Subjective:    Patient ID: Ruben Conner, DDS, male    DOB: [birth year], 85 y.o.   MRN: [id number]  HPI Patient had a fall on .He is hurting on his left side-ribe area and he also hit his head.  He comes to the visit today with his daughter-in-law.  He has a complicated history.  He has a history of chronic combined systolic heart failure and dilated cardiomyopathy.  He sees a cardiologist regularly for this.  He has arthritis in his knee.  He had a fall back in January and damaged his wrist.  He sees the nephrologist regularly and the last creatinine that we have recorded is 3.19.  He had another fall on the evening of March 19 in the house and hit his head and left chest.  He refused at that time to go to see anyone although he was advised by the EMS people to do this.  He was devoted to his wife and she has to have constant care.  Today he says he is feeling better from the fall but still has contusions especially on the face and left arm which seem to be resolving.  He did get some x-rays here of his ribs and chest x-ray.  He says his shortness of breath is no worse than it has been though he remains sore over the sternum on the left side and in his left chest wall.  He denies any chest pain other than the soreness.  He has no complaints with nausea vomiting diarrhea blood in the stool black tarry bowel movements or change in bowel habits.  He is passing his water well.  He does not think he is still taking vitamin D but we will make sure that he is not because of his most recent creatinine that was 3.19.  His initial vital signs were stable and he was not running any fever.  He relayed the history completely to me.  He is using a 4 pronged [place] more regularly but realizes that taking care of himself and taking care of his wife is getting to be more difficult.  Patient Active Problem List   Diagnosis Date Noted  . Biventricular cardiac pacemaker in situ 11/20/2018  . CHF exacerbation ([place])  08/11/2018  . Left wrist fracture, closed, initial encounter 08/11/2018  . Degloving injury of left hand 08/11/2018  . Paroxysmal atrial fibrillation ([place]) 08/10/2018  . Cardiomyopathy ([place]) 08/10/2018  . Chronic combined systolic and diastolic heart failure ([place]) 05/31/2018  . Acute systolic heart failure ([place]) 05/24/2018  . Persistent atrial fibrillation 05/24/2018  . Sarcoidosis 04/06/2018  . CKD (chronic kidney disease) stage 4, GFR 15-29 ml/min (HCC) 04/06/2018  . Edema 02/23/2018  . Mediastinal lymphadenopathy 03/10/2017  . Anemia in stage 4 chronic kidney disease ([place]) 09/18/2016  . Vitamin D deficiency 09/18/2016  . Sinus bradycardia 08/31/2014  . First degree AV block 08/31/2014  . LBBB (left bundle branch block) 08/31/2014  . Essential hypertension 09/20/2013  . Controlled type 2 diabetes mellitus with stage 4 chronic kidney disease, without long-term current use of insulin ([place]) 05/02/2013  . Hypercholesterolemia 05/02/2013   Outpatient Encounter Medications as of 02/16/2019  Medication Sig  . acetaminophen (TYLENOL) 500 MG tablet Take 1,000 mg by mouth every 6 (six) hours as needed for moderate pain or headache.  Marland Kitchen amiodarone (PACERONE) 200 MG tablet Take 1 tablet (200 mg total) by mouth daily.  . calcitRIOL (ROCALTROL) 0.25 MCG capsule   . carvedilol (COREG) 3.125 MG  tablet Take 1 tablet (3.125 mg total) by mouth 2 (two) times daily.  . Cholecalciferol (VITAMIN D3) 5000 units CAPS Take 5,000 Units by mouth every evening.   Marland Kitchen ELIQUIS 2.5 MG TABS tablet TAKE  (1)  TABLET TWICE A DAY.  Marland Kitchen glimepiride (AMARYL) 4 MG tablet TAKE 1 TABLET DAILY  . glucose blood (ONETOUCH VERIO) test strip Twice Daily Dx E11.9  . polyethylene glycol (MIRALAX / GLYCOLAX) packet Take 17 g by mouth 2 (two) times daily.  . potassium chloride SA (KLOR-CON M20) 20 MEQ tablet If <200 lbs NO Potassium. If 200-205 lbs take 1 tab (20 mEq) daily with Torsemide. If >205 lbs take 2 tabs (40 mEq) daily with  Torsemide.  . senna (SENOKOT) 8.6 MG TABS tablet Take 1 tablet (8.6 mg total) by mouth 2 (two) times daily.  Marland Kitchen torsemide (DEMADEX) 20 MG tablet If <200 lbs NO Torsemide or KCL. If 200-205 lbs take 23m daily with 25m Potassium. If weight >205lbs take 8075mith 56m56motassium daily  . vitamin B-12 100 MCG tablet Take 1 tablet (100 mcg total) by mouth daily.  . [DISCONTINUED] dextromethorphan (DELSYM) 30 MG/5ML liquid Take 2.5 mLs (15 mg total) by mouth 2 (two) times daily as needed for cough.  . [DISCONTINUED] guaiFENesin (MUCINEX) 600 MG 12 hr tablet Take 1 tablet (600 mg total) by mouth 2 (two) times daily.  . [DISCONTINUED] predniSONE (DELTASONE) 5 MG tablet Take one tablet every other day.   No facility-administered encounter medications on file as of 02/16/2019.        Review of Systems  Constitutional: Negative.   HENT: Negative.   Eyes: Negative.   Respiratory: Negative.   Cardiovascular: Negative.   Gastrointestinal: Negative.   Endocrine: Negative.   Genitourinary: Negative.   Musculoskeletal: Positive for arthralgias (right shoulder pain (on-going)) and myalgias (left rib pain).  Skin: Positive for color change (bruising, hematoma on forehead).  Allergic/Immunologic: Negative.   Neurological: Positive for weakness.  Hematological: Negative.   Psychiatric/Behavioral: Negative.        Objective:   Physical Exam Vitals signs and nursing note reviewed.  Constitutional:      General: He is not in acute distress.    Appearance: Normal appearance. He is well-developed.     Comments: The patient was pleasant and alert wearing his facial mask and having a large hematoma above the left eye.  HENT:     Head: Normocephalic and atraumatic.     Right Ear: External ear normal. There is impacted cerumen.     Left Ear: Tympanic membrane, ear canal and external ear normal. There is no impacted cerumen.     Nose: Nose normal.     Mouth/Throat:     Pharynx: No oropharyngeal exudate.   Eyes:     General: No scleral icterus.       Right eye: No discharge.        Left eye: No discharge.     Extraocular Movements: Extraocular movements intact.     Conjunctiva/sclera: Conjunctivae normal.     Pupils: Pupils are equal, round, and reactive to light.     Comments: Large resolving hematoma left forehead and contusion down left cheek.  Eyes had good range of motion.  Pupils are equal round reactive to light.  Neck:     Musculoskeletal: Normal range of motion and neck supple.     Thyroid: No thyromegaly.     Trachea: No tracheal deviation.  Cardiovascular:     Rate and Rhythm: Normal  rate and regular rhythm.     Heart sounds: Normal heart sounds. No murmur. No gallop.      Comments: Heart was regular today at 60/min there was some pedal edema bilaterally about 1+ with the left being worse than the right with good inguinal pulses. Pulmonary:     Effort: Pulmonary effort is normal. No respiratory distress.     Breath sounds: Normal breath sounds. No wheezing or rales.     Comments: Slight congestion with breathing but this seemed to clear with coughing and clearing the throat. Chest:     Chest wall: Tenderness present.  Abdominal:     General: Bowel sounds are normal.     Palpations: Abdomen is soft. There is no mass.     Tenderness: There is no abdominal tenderness. There is no guarding.  Musculoskeletal: Normal range of motion.        General: No tenderness.     Comments: The patient is weak in his lower extremities and needed assistance with getting on the table and getting off the table.  He has slight swelling and edema of both lower extremities with the left being worse than the right.  Lymphadenopathy:     Cervical: No cervical adenopathy.  Skin:    General: Skin is warm and dry.     Findings: No lesion or rash.  Neurological:     General: No focal deficit present.     Mental Status: He is alert and oriented to person, place, and time.     Cranial Nerves: No  cranial nerve deficit.     Motor: Weakness present.     Deep Tendon Reflexes: Reflexes are normal and symmetric.  Psychiatric:        Mood and Affect: Mood normal.        Behavior: Behavior normal.        Thought Content: Thought content normal.        Judgment: Judgment normal.     Comments: Patient's mood is positive as it always has been.    BP (!) 143/72 (BP Location: Right Arm)   Pulse (!) 59   Temp (!) 97.2 F (36.2 C) (Oral)   Ht 6' (1.829 m)   Wt 204 lb (92.5 kg)   BMI 27.67 kg/m         Assessment & Plan:  1. Rib pain on left side -X-ray revealed partially displaced sixth and seventh rib fractures on the left. - DG Ribs Unilateral W/Chest Left; Future  2. Stage 4 chronic kidney disease ([place]) -Follow-up with nephrology as planned and if taking vitamin D discontinue this. - BMP8+EGFR  3. Controlled type 2 diabetes mellitus with complication, without long-term current use of insulin (HCC) -Try to monitor blood sugars more closely and check these either fasting or prior to the next meal.  4. Weakness -Continue to use 4 pronged cane. - Thyroid Panel With TSH - CBC with Differential/Platelet - BMP8+EGFR - VITAMIN D 25 Hydroxy (Vit-D Deficiency, Fractures) - Hepatic function panel - CT Head Wo Contrast; Future  5. Injury of head, initial encounter -Head injury sheet given - CT Head Wo Contrast; Future  6. Fall, initial encounter -Head injury sheet given and patient is encouraged to use 4 pronged cane for more stability and walking. -Information regarding [facility] -Information regarding sitters - CT Head Wo Contrast; Future  7.  Combined systolic and diastolic heart failure with cardiomyopathy -Follow-up with cardiology as planned  8.  Chronic kidney disease -Follow-up with nephrology as  planned and discontinue vitamin D if still taking this  No orders of the defined types were placed in this encounter.  Patient Instructions  Follow head injury  precautions Use Mcburney or cane for gait stability Continue to use Zavalza regularly Continue to make every effort to move slowly so as not to fall Continue to drink plenty of water and fluids Check blood sugars periodically Stay in as much as possible until the coronavirus subsides Use Ortez as directed Try to take deep breaths Elevate feet as much as possible to reduce swelling And use support stockings Discontinue vitamin D   Arrie Senate MD

## 2019-02-17 ENCOUNTER — Ambulatory Visit
Admission: RE | Admit: 2019-02-17 | Discharge: 2019-02-17 | Disposition: A | Discharge status: Home / Self Care | Payer: PPO | Source: Ambulatory Visit | Attending: Family Medicine | Admitting: Family Medicine

## 2019-02-17 ENCOUNTER — Other Ambulatory Visit: Payer: Self-pay

## 2019-02-17 DIAGNOSIS — R531 Weakness: Secondary | ICD-10-CM

## 2019-02-17 DIAGNOSIS — S0990XA Unspecified injury of head, initial encounter: Secondary | ICD-10-CM | POA: Diagnosis not present

## 2019-02-17 DIAGNOSIS — W19XXXA Unspecified fall, initial encounter: Secondary | ICD-10-CM

## 2019-02-17 LAB — HEPATIC FUNCTION PANEL
ALT: 9 IU/L (ref 0–44)
AST: 11 IU/L (ref 0–40)
Albumin: 3.6 g/dL (ref 3.6–4.6)
Alkaline Phosphatase: 102 IU/L (ref 39–117)
Bilirubin Total: 0.3 mg/dL (ref 0.0–1.2)
Bilirubin, Direct: 0.09 mg/dL (ref 0.00–0.40)
Total Protein: 6.1 g/dL (ref 6.0–8.5)

## 2019-02-17 LAB — THYROID PANEL WITH TSH
Free Thyroxine Index: 3.1 (ref 1.2–4.9)
T3 Uptake Ratio: 33 % (ref 24–39)
T4, Total: 9.4 ug/dL (ref 4.5–12.0)
TSH: 3.47 u[IU]/mL (ref 0.450–4.500)

## 2019-02-17 LAB — CBC WITH DIFFERENTIAL/PLATELET
Basophils Absolute: 0.1 10*3/uL (ref 0.0–0.2)
Basos: 1 %
EOS (ABSOLUTE): 0.2 10*3/uL (ref 0.0–0.4)
Eos: 2 %
Hematocrit: 30.1 % — ABNORMAL LOW (ref 37.5–51.0)
Hemoglobin: 9.7 g/dL — ABNORMAL LOW (ref 13.0–17.7)
Immature Grans (Abs): 0.1 10*3/uL (ref 0.0–0.1)
Immature Granulocytes: 2 %
Lymphocytes Absolute: 0.6 10*3/uL — ABNORMAL LOW (ref 0.7–3.1)
Lymphs: 8 %
MCH: 28.8 pg (ref 26.6–33.0)
MCHC: 32.2 g/dL (ref 31.5–35.7)
MCV: 89 fL (ref 79–97)
Monocytes Absolute: 0.6 10*3/uL (ref 0.1–0.9)
Monocytes: 8 %
Neutrophils Absolute: 6 10*3/uL (ref 1.4–7.0)
Neutrophils: 79 %
Platelets: 309 10*3/uL (ref 150–450)
RBC: 3.37 x10E6/uL — ABNORMAL LOW (ref 4.14–5.80)
RDW: 14.1 % (ref 11.6–15.4)
WBC: 7.5 10*3/uL (ref 3.4–10.8)

## 2019-02-17 LAB — BMP8+EGFR
BUN/Creatinine Ratio: 11 (ref 10–24)
BUN: 31 mg/dL — ABNORMAL HIGH (ref 8–27)
CO2: 23 mmol/L (ref 20–29)
Calcium: 9.8 mg/dL (ref 8.6–10.2)
Chloride: 96 mmol/L (ref 96–106)
Creatinine, Ser: 2.94 mg/dL — ABNORMAL HIGH (ref 0.76–1.27)
GFR calc Af Amer: 21 mL/min/{1.73_m2} — ABNORMAL LOW (ref >59)
GFR calc non Af Amer: 18 mL/min/{1.73_m2} — ABNORMAL LOW (ref >59)
Glucose: 133 mg/dL — ABNORMAL HIGH (ref 65–99)
Potassium: 3.8 mmol/L (ref 3.5–5.2)
Sodium: 137 mmol/L (ref 134–144)

## 2019-02-17 LAB — VITAMIN D 25 HYDROXY (VIT D DEFICIENCY, FRACTURES): Vit D, 25-Hydroxy: 73.4 ng/mL (ref 30.0–100.0)

## 2019-02-18 ENCOUNTER — Telehealth: Payer: Self-pay | Admitting: Cardiovascular Disease

## 2019-02-18 NOTE — Telephone Encounter (Signed)
Attempted to reach pt for pre-call for appt on 02/22/19 with Dr. Sallyanne Kuster. Tried both numbers. One was busy and other went straight to voicemail. Will attempt to call again.

## 2019-02-21 NOTE — Telephone Encounter (Signed)
Virtual Visit Pre-Appointment Phone Call  Steps For Call:  1. Confirm consent - "In the setting of the current Covid19 crisis, you are scheduled for a (phone or video) visit with your provider on (date) at (time).  Just as we do with many in-office visits, in order for you to participate in this visit, we must obtain consent.  If you'd like, I can send this to your mychart (if signed up) or email for you to review.  Otherwise, I can obtain your verbal consent now.  All virtual visits are billed to your insurance company just like a normal visit would be.  By agreeing to a virtual visit, we'd like you to understand that the technology does not allow for your provider to perform an examination, and thus may limit your provider's ability to fully assess your condition.  Finally, though the technology is pretty good, we cannot assure that it will always work on either your or our end, and in the setting of a video visit, we may have to convert it to a phone-only visit.  In either situation, we cannot ensure that we have a secure connection.  Are you willing to proceed?"  2. Give patient instructions for WebEx download to smartphone as below if video visit  3. Advise patient to be prepared with any vital sign or heart rhythm information, their current medicines, and a piece of paper and pen handy for any instructions they may receive the day of their visit  4. Inform patient they will receive a phone call 15 minutes prior to their appointment time (may be from unknown caller ID) so they should be prepared to answer  5. 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 I spoke with the patient via phone to ensure availability of phone/video source, confirm preferred email & phone number, and discuss  instructions and expectations.  I reminded Ruben Conner, DDS to be prepared with any vital sign and/or heart rhythm information that could potentially be obtained via home monitoring, at the time of his visit. I reminded Ruben Conner, DDS to expect a phone call at the time of his visit if his visit.  Did the patient verbally acknowledge consent to treatment? Yes  [name] 02/21/2019 2:45 PM  CONSENT FOR TELE-HEALTH VISIT - PLEASE REVIEW  I hereby voluntarily request, consent and authorize [facility] and its employed or contracted physicians, physician assistants, nurse practitioners or other licensed health care professionals (the Practitioner), to provide me with telemedicine health care services (the Services") as deemed necessary by the treating Practitioner. I acknowledge and consent to receive the Services by the Practitioner via telemedicine. I understand that the telemedicine visit will involve communicating with the Practitioner through live audiovisual communication technology and the disclosure of certain medical information by electronic transmission. I acknowledge that I have been given the opportunity to request an in-person assessment or other available alternative prior to the telemedicine visit and am voluntarily participating in the telemedicine visit.  I understand that I have the right to withhold or withdraw my consent to the use of telemedicine in the course of my care at any time, without affecting my right to future care or treatment, and that the Practitioner or I may terminate the telemedicine visit at any time. I  understand that I have the right to inspect all information obtained and/or recorded in the course of the telemedicine visit and may receive copies of available information for a reasonable fee.  I understand that some of the potential risks of receiving the Services via telemedicine include:   Delay or interruption in medical evaluation due to  technological equipment failure or disruption;  Information transmitted may not be sufficient (e.g. poor resolution of images) to allow for appropriate medical decision making by the Practitioner; and/or   In rare instances, security protocols could fail, causing a breach of personal health information.  Furthermore, I acknowledge that it is my responsibility to provide information about my medical history, conditions and care that is complete and accurate to the best of my ability. I acknowledge that Practitioner's advice, recommendations, and/or decision may be based on factors not within their control, such as incomplete or inaccurate data provided by me or distortions of diagnostic images or specimens that may result from electronic transmissions. I understand that the practice of medicine is not an exact science and that Practitioner makes no warranties or guarantees regarding treatment outcomes. I acknowledge that I will receive a copy of this consent concurrently upon execution via email to the email address I last provided but may also request a printed copy by calling the office of [facility].    I understand that my insurance will be billed for this visit.   I have read or had this consent read to me.  I understand the contents of this consent, which adequately explains the benefits and risks of the Services being provided via telemedicine.   I have been provided ample opportunity to ask questions regarding this consent and the Services and have had my questions answered to my satisfaction.  I give my informed consent for the services to be provided through the use of telemedicine in my medical care  By participating in this telemedicine visit I agree to the above.

## 2019-02-21 NOTE — Addendum Note (Signed)
Addended by: [name] on: 02/21/2019 02:55 PM   Modules accepted: Orders

## 2019-02-22 ENCOUNTER — Ambulatory Visit (INDEPENDENT_AMBULATORY_CARE_PROVIDER_SITE_OTHER): Payer: PPO

## 2019-02-22 ENCOUNTER — Telehealth (INDEPENDENT_AMBULATORY_CARE_PROVIDER_SITE_OTHER): Payer: PPO | Admitting: Cardiovascular Disease

## 2019-02-22 ENCOUNTER — Encounter: Payer: Self-pay | Admitting: Cardiovascular Disease

## 2019-02-22 ENCOUNTER — Other Ambulatory Visit: Payer: Self-pay

## 2019-02-22 DIAGNOSIS — N184 Chronic kidney disease, stage 4 (severe): Secondary | ICD-10-CM | POA: Diagnosis not present

## 2019-02-22 DIAGNOSIS — E1122 Type 2 diabetes mellitus with diabetic chronic kidney disease: Secondary | ICD-10-CM

## 2019-02-22 DIAGNOSIS — D869 Sarcoidosis, unspecified: Secondary | ICD-10-CM

## 2019-02-22 DIAGNOSIS — I5042 Chronic combined systolic (congestive) and diastolic (congestive) heart failure: Secondary | ICD-10-CM

## 2019-02-22 DIAGNOSIS — I130 Hypertensive heart and chronic kidney disease with heart failure and stage 1 through stage 4 chronic kidney disease, or unspecified chronic kidney disease: Secondary | ICD-10-CM

## 2019-02-22 DIAGNOSIS — I429 Cardiomyopathy, unspecified: Secondary | ICD-10-CM | POA: Diagnosis not present

## 2019-02-22 DIAGNOSIS — I480 Paroxysmal atrial fibrillation: Secondary | ICD-10-CM

## 2019-02-22 DIAGNOSIS — E7800 Pure hypercholesterolemia, unspecified: Secondary | ICD-10-CM | POA: Diagnosis not present

## 2019-02-22 DIAGNOSIS — Z950 Presence of cardiac pacemaker: Secondary | ICD-10-CM

## 2019-02-22 DIAGNOSIS — I447 Left bundle-branch block, unspecified: Secondary | ICD-10-CM | POA: Diagnosis not present

## 2019-02-22 DIAGNOSIS — I10 Essential (primary) hypertension: Secondary | ICD-10-CM

## 2019-02-22 NOTE — Progress Notes (Signed)
Virtual Visit via Video Note   This visit type was conducted due to national recommendations for restrictions regarding the COVID-19 Pandemic (e.g. social distancing) in an effort to limit this patient's exposure and mitigate transmission in our community.  Due to his co-morbid illnesses, this patient is at least at moderate risk for complications without adequate follow up.  This format is felt to be most appropriate for this patient at this time.  All issues noted in this document were discussed and addressed.  A limited physical exam was performed with this format.  Please refer to the patient's chart for his consent to telehealth for [facility].   Evaluation Performed:  Follow-up visit  Date:  02/22/2019   ID:  Ruben Conner, Ruben Conner, DOB 01/13/1930, MRN [id number]  Patient Location: Home  Provider Location: Office  PCP:  Ruben Herb, MD  Cardiologist:  Ruben Klein, MD  Electrophysiologist:  None   Chief Complaint:  Fall, edema  History of Present Illness:    Ruben Conner, Ruben Conner is a 85 y.o. male who presents via audio/video conferencing for a telehealth visit today.    Ruben Conner, Ruben Conner "Ruben Conner" is a 85 y.o. male with long-standing asymptomatic bradycardia, left bundle branch block and prolonged AV conduction time, pulmonary and mediastinal sarcoidosis, who recently developed persistent atrial fibrillation and acute heart failure. He has systemic hypertension and controlled type 2 diabetes mellitus on oral anti-diabetics.  2019 he developed depressed left ventricular systolic function (EF 16%) and on May 31, 2018 he underwent implantation of a dual-chamber CRT-P device ([employer], Dr. Lovena Conner) and was subsequently started on amiodarone.  He had a wrist fracture in the fall 2019, after mechanical fall.  Ruben Conner had another fall and has a large left periorbital ecchymosis.  CT scan did not show evidence of subdural hematoma.  He did fracture his sixth and seventh ribs,  partially displaced, symptoms are improving.    Around the time of his fall he only weighed 195 pounds, previously reestablish his "dry weight" to be around 203 pounds.  He has been holding off his diuretic and has gained back 298 pounds.  His Corvue shows the expected shift in thoracic impedance.  Most recent creatinine level was 2.94 on April 1, had been 3.1 at his most recent appointment with Ruben Conner.  Most recent hemoglobin also from April 1 is 9.7.  CRT device interrogation shows 93% biventricular pacing.  There has been no atrial fibrillation detected.  He only has about 33% atrial pacing.  He has become increasingly unsteady and is looking into assisted living for his wife Ruben Conner and himself.  Unfortunately the current coronavirus shelter in place restrictions have limited his family's ability to visits assisted living facilities.  The patient does not have symptoms concerning for COVID-19 infection (fever, chills, cough, or new shortness of breath).   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 The abnormality was first seen on a chest x-ray performed in February 2018, following a protracted case of upper respiratory infection and bronchitis. It was hypermetabolic on PET scan, but attempts to obtain a tissue diagnosis with both bronchoscopy and mediastinoscopy were unsuccessful.  After radiation therapy he developed worsening bilateral hilar lymphadenopathy and right upper lobe radiation pneumonitis.  Repeat attempts at biopsying the abnormality in February 2019 have led to a diagnosis of sarcoidosis, based on the presence of nonnecrotizing granulomas.  Pulmonary function tests did not show evidence of significant obstruction or restriction, but diffusion capacity was decreased to 59%.  He was started on steroid therapy.    He sees Ruben Conner for CKD stage IV. Creatinine has  been in the 2-2.7 range. Since Novemebr 2018, stable at 2.8.  He remains concerned by the progressive dementia of his wife, Ruben Conner, who is also my patient.  Many of the decisions he takes for his own health care are impacted by the fact that he wants to survive to take care of her.  They continue to live by themselves in a very large house and he only has a housekeeper come in twice a week.  Past Medical History:  Diagnosis Date   Arthritis    "knees, probably in my back" (08/12/2018)   Cancer ([place])    lung   CHF (congestive heart failure) (HCC)    CKD (chronic kidney disease), stage IV ([place])    Fall 08/11/2018   "going into dr's appointment" (08/12/2018)   Hyperlipidemia    Hypertension    Irregular heart rate    Ruben Conner December note from 04/2017 states bradycardia, LBBB   Pneumonia    "probably 3 times in my life; at the most" (08/12/2018)   Presence of permanent cardiac pacemaker    Renal insufficiency    Sarcoidosis, lung ([place])    Type II diabetes mellitus ([place])    Past Surgical History:  Procedure Laterality Date   BIV PACEMAKER INSERTION CRT-P N/A 05/31/2018   Procedure: BIV PACEMAKER INSERTION CRT-P;  Surgeon: Ruben Lance, MD;  Location: [place] [facility];  Service: Cardiovascular;  Laterality: N/A;   CATARACT EXTRACTION W/ INTRAOCULAR LENS  IMPLANT, BILATERAL Bilateral    ENDOBRONCHIAL ULTRASOUND Bilateral 03/23/2017   Procedure: ENDOBRONCHIAL ULTRASOUND;  Surgeon: Ruben Noel, MD;  Location: [facility];  Service: Cardiopulmonary;  Laterality: Bilateral;   INSERT / REPLACE / REMOVE PACEMAKER     LUNG BIOPSY  X 5   "thought it was lung cancer; ended up being sarcoidosis"   MEDIASTINOSCOPY N/A 04/08/2017   Procedure: MEDIASTINOSCOPY;  Surgeon: Ruben Isaac, MD;  Location: [place];  Service: Thoracic;  Laterality: N/A;   MOUTH SURGERY  "several"   "to fix bone so bridgework fit better"   NASAL SINUS SURGERY  1990s   "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; had OR to clean out both sides"   ORIF WRIST FRACTURE Left 08/13/2018   Procedure: OPEN REDUCTION INTERNAL FIXATION (ORIF) WRIST FRACTURE;  Surgeon: Iran Planas, MD;  Location: [place];  Service: Orthopedics;  Laterality: Left;   PILONIDAL CYST EXCISION  1958   SHOULDER OPEN ROTATOR CUFF REPAIR Left    TEE WITHOUT CARDIOVERSION N/A 05/28/2018   Procedure: TRANSESOPHAGEAL ECHOCARDIOGRAM (TEE);  Surgeon: Ruben Klein, MD;  Location: [place];  Service: Cardiovascular;  Laterality: N/A;   TUMOR EXCISION     "[place] tumor removed off neck under my left ear"   [place] N/A 04/08/2017   Procedure: VIDEO BRONCHOSCOPY WITH ENDOBRONCHIAL ULTRASOUND;  Surgeon: Ruben Isaac, MD;  Location: [place];  Service: Thoracic;  Laterality: N/A;   VIDEO BRONCHOSCOPY WITH ENDOBRONCHIAL ULTRASOUND N/A 12/08/2017   Procedure: VIDEO BRONCHOSCOPY with BRONCHIAL BIOPSIES AND ENDOBRONCHIAL ULTRASOUND WITH TRANSBRONCHIAL AND BRONCHIAL BIOPSIES;  Surgeon: Ruben Isaac, MD;  Location: [place];  Service: Thoracic;  Laterality: N/A;     No outpatient medications have been marked as taking for the 02/22/19 encounter (Telemedicine)  with Burlene Montecalvo, Dani Gobble, MD.     Allergies:   Patient has no known allergies.   Social History   Tobacco Use   Smoking status: Former Smoker    Packs/day: 0.10    Years: 53.00    Pack years: 5.30    Types: Cigarettes    Last attempt to quit: 11/28/2016    Years since quitting: 2.2   Smokeless tobacco: Never Used  Substance Use Topics   Alcohol use: Not Currently    Alcohol/week: 0.0 standard drinks    Comment: 08/12/2018 "nothing in 5 yrs; never drank much"   Drug use: Never     Family Hx: The patient's family history includes Cancer in his son; Heart disease in his father; Kidney disease in his mother; Suicidality in his son.  ROS:   Please see the history of present illness.     All other  systems reviewed and are negative.   Prior CV studies:   The following studies were reviewed today:  ICD HF [email or web address] download from earlier today  Labs/Other Tests and Data Reviewed:    EKG:  No ECG reviewed.  Recent Labs: 08/30/2018: Magnesium 1.8 10/27/2018: BNP 210.6 02/16/2019: ALT 9; BUN 31; Creatinine, Ser 2.94; Hemoglobin 9.7; Platelets 309; Potassium 3.8; Sodium 137; TSH 3.470   Recent Lipid Panel Lab Results  Component Value Date/Time   CHOL 169 11/30/2018 03:17 PM   TRIG 123 11/30/2018 03:17 PM   TRIG 108 07/06/2014 09:36 AM   HDL 41 11/30/2018 03:17 PM   HDL 35 (L) 07/06/2014 09:36 AM   CHOLHDL 4.1 11/30/2018 03:17 PM   LDLCALC 103 (H) 11/30/2018 03:17 PM   LDLCALC 83 07/06/2014 09:36 AM    Wt Readings from Last 3 Encounters:  02/22/19 198 lb (89.8 kg)  02/16/19 204 lb (92.5 kg)  11/30/18 201 lb (91.2 kg)     Objective:    Vital Signs:  BP (!) 146/72    Pulse 62    Ht 6' (1.829 m)    Wt 198 lb (89.8 kg)    BMI 26.85 kg/m    Well nourished, well developed male in  acute distress. Extensive periorbital ecchymosis on the left with blood tracking down the side of his face and left side of his neck.  No obvious jugular venous distention. Appears comfortable sitting up in chair, no respiratory difficulty, but appears older and frail. Little ankle swelling, 2+ puffy edema of both feet symmetrically.  ASSESSMENT & PLAN:    1. CHF:  He has some mild pedal edema but no sign of respiratory difficulty.  Has a narrow margin between "too dry" (hypotension, falls, worsening renal function) versus "too wet" (respiratory difficulty and worsening edema due to heart failure).  Seems to do best at around 200-205 pounds.  Has a sliding scale of torsemide prescribed  to try to achieve that balance (no diuretic if under 200 pounds, torsemide 40 mg on days when he weighs 200-205 pounds, torsemide 80 mg daily at over 205 pounds). RAAS inhibitors are contraindicated with his degree of  kidney failure.  2. AFib:  None detected recently.  I am very concerned about his second fall in 6 months.  May have to discontinue anticoagulation despite his elevated embolic risk. CHADSVasc 4 (age 73, HTN, CHF).  On Eliquis dose adjusted for age and renal dysfunction.  On amiodarone.   3. Amiodarone: Will need liver function tests and thyroid function tests every 6 months. Normal LFTs and normal thyroid tests on February 15, 2018  4. Cardiomyopathy: The cause of the cardiomyopathy is not immediately clear, but need to keep in mind the possibility that it could be related to sarcoidosis.    We have decided to steer clear of contrast based procedures due to his renal dysfunction.  Aortic and coronary artery vascular calcifications are clearly present on his CT chest, but not unexpected in an 85 year old.    We have been planning a repeat echocardiogram to guide further evaluation, but at this point this is not a safe option due to the coronavirus outbreak. 5. CRT-P: Normally functioning device with >90% biventricular pacing and clinical improvement.  He did have some diaphragmatic stimulation in the past, but he did not complain of this today. 6. HTN:  Slightly elevated but I think we need to allow for some degree of orthostatic hypotension which may have led to his 2 falls. 7. CKD 4: New creatinine baseline seems to be 2.9 (GFR 20).  He has significant proteinuria.  This was presumed to be secondary to diabetes and hypertension, but Ruben Conner reports in his notes that retrospectively he now wonders whether there may have been a connection with a diagnosis of sarcoidosis.  There was no obvious improvement during treatment with steroids.. 8. HLP: Target LDL under 100 due to diabetes mellitus, under 70 if we discover significant CAD.In January his LDL was up to 103 and Crestor 2.5 mg 3 days a week was started by Dr. Laurance Flatten at that time. 9. DM:  Avoid Actos. 10. Pulmonary sarcoidosis  COVID-19 Education: The  signs and symptoms of COVID-19 were discussed with the patient and how to seek care for testing (follow up with PCP or arrange E-visit).  The importance of social distancing was discussed today.  Time:   Today, I have spent 25 minutes with the patient with telehealth technology discussing the above problems.     Medication Adjustments/Labs and Tests Ordered: Current medicines are reviewed at length with the patient today.  Concerns regarding medicines are outlined above.  Tests Ordered: No orders of the defined types were placed in this encounter.  Medication Changes: No orders of the defined types were placed in this encounter.   Disposition:  Follow up 3 months  Signed, Ruben Klein, MD  02/22/2019 2:54 PM    [place]

## 2019-02-22 NOTE — Progress Notes (Signed)
EPIC Encounter for ICM Monitoring  Patient Name: Ruben Conner, Ruben Conner is a 85 y.o. male Date: 02/22/2019 Primary Care Physican: Chipper Herb, MD Primary Cardiologist:Croitoru Electrophysiologist:Taylor Nephrologist: Dr Joelyn Oms Bi-V Pacing:93% Last Weight:198lbs  02/09/2019 Weight: 195 lbs   Transmission Reviewed.  Patient has virtual office visit by phone today with Dr Sallyanne Kuster.    Report: Thoracic impedance abnormal suggesting fluid accumulation since 02/13/2019.  Prescribed sliding scale Torsemide by Dr Sallyanne Kuster No diuretics if under 200 lb with no KCl Torsemide 40 mg daily if weight is 200-205 lb, with KCL 20 mEq daily Torsemide 80 mg if weight if >205 lb, with KCl 40 mEqdaily  Labs: 12/30/2018 Creatinine 3.19, BUN 36, Potassium 4.7, Sodium 141, GFR 17-19 11/30/2018 Creatinine 3.11, BUN 60, Potassium 4.8, Sodium 137, GFR 17-20 10/04/2018 Creatinine 2.82, BUN 43, Potassium 4.0, Sodium 139, GFR 19-22 09/24/2018 Creatinine 2.82, BUN 31, Potassium 4.0, Sodium 138, GFR 19-22 09/02/2018 Creatinine2.73, BUN38, Potassium3.6, Sodium139, GFR20-23 08/30/2018 Creatinine2.80, BUN40, Potassium3.9, [id number], [id number]  08/17/2018 Creatinine2.57, BUN40, Potassium3.7, Sodium135, GFR21-24  08/16/2018 Creatinine2.84, BUN38, Potassium3.7, [id number], [id number]  08/15/2018 Creatinine3.31, BUN40, Potassium3.7, [id number], [id number] A complete set of results can be found in Results Review.  Recommendations:Copy of ICM check sent to Dr Sallyanne Kuster for review for virtual phone call visit today.      Follow-up plan: ICM clinic phone appointment on4/15/2020. Office appt 02/22/2019 with Dr.Croitoru (remote transmission scheduled 4/7 in the event office visit changes to e-visit).   Copy of ICM check sent to Dr.Taylor.  3 month ICM trend: 02/22/2019    1 Year ICM trend:       Rosalene Billings, RN 02/22/2019 7:49 AM

## 2019-02-22 NOTE — Progress Notes (Signed)
Thanks, I had a televisit with him. His feet were a little swollen, but no dyspnea. He had a recent fall, probably hypotension, when he weighed 195. Now up to 198 lb. Reiterated no diuretics until he reaches 200 lb.

## 2019-02-22 NOTE — Patient Instructions (Signed)
Medication Instructions:   No Diuretics if under weight 200 lbs with no KCL ( potassium ) Torsemide 40 mg every day if weight 200 -205 lbs with KCL ( potassium ) 20 meq daily Torsemide 80 mg if weight greater than 205 lbs with KCL ( potassium ) 40 meq daily    Lab work: None ordered   Testing/Procedures: None ordered  Follow-Up: At [facility], you and your health needs are our priority.  As part of our continuing mission to provide you with exceptional heart care, we have created designated Provider Care Teams.  These Care Teams include your primary Cardiologist (physician) and Advanced Practice Providers (APPs -  Physician Assistants and Nurse Practitioners) who all work together to provide you with the care you need, when you need it. . Follow up appointment with Dr.Croitoru Tuesday 06/07/19 at 1:20 pm.

## 2019-02-22 NOTE — Telephone Encounter (Signed)
   Cardiac Questionnaire:    Since your last visit or hospitalization:    1. Have you been having new or worsening chest pain? NO   2. Have you been having new or worsening shortness of breath? NO 3. Have you been having new or worsening leg swelling, wt gain, or increase in abdominal girth (pants fitting more tightly)? NO   4. Have you had any passing out spells? NO    *A YES to any of these questions would result in the appointment being kept. *If all the answers to these questions are NO, we should indicate that given the current situation regarding the worldwide coronarvirus pandemic, at the recommendation of the CDC, we are looking to limit gatherings in our waiting area, and thus will reschedule their appointment beyond four weeks from today.   _____________   [id number] Pre-Screening Questions:  . Do you currently have a fever? NO . Have you recently travelled on a cruise, internationally, or to [place], [place], [place], [place], [place], or [place], [place] [employer])? NO . Have you been in contact with someone that is currently pending confirmation of Covid19 testing or has been confirmed to have the [place] virus?  NO Are you currently experiencing fatigue or cough?NO

## 2019-03-02 ENCOUNTER — Other Ambulatory Visit: Payer: Self-pay

## 2019-03-02 ENCOUNTER — Telehealth: Payer: Self-pay | Admitting: Family Medicine

## 2019-03-02 ENCOUNTER — Ambulatory Visit (INDEPENDENT_AMBULATORY_CARE_PROVIDER_SITE_OTHER): Payer: PPO

## 2019-03-02 ENCOUNTER — Other Ambulatory Visit: Payer: Self-pay | Admitting: Pulmonary Disease

## 2019-03-02 DIAGNOSIS — I5042 Chronic combined systolic (congestive) and diastolic (congestive) heart failure: Secondary | ICD-10-CM

## 2019-03-02 DIAGNOSIS — Z950 Presence of cardiac pacemaker: Secondary | ICD-10-CM

## 2019-03-02 NOTE — Telephone Encounter (Signed)
Both the husband and the wife are involved with [facility] who do home visits and urgent care visits and this is great to help them not have to get out of the home.  Hopefully they will send [place] notes about those visits ongoing.

## 2019-03-02 NOTE — Telephone Encounter (Signed)
FYI

## 2019-03-03 ENCOUNTER — Telehealth: Payer: Self-pay | Admitting: Pulmonary Disease

## 2019-03-03 DIAGNOSIS — D869 Sarcoidosis, unspecified: Secondary | ICD-10-CM

## 2019-03-03 NOTE — Telephone Encounter (Signed)
Called & spoke w/ pt regarding CT scan question. According to [place] 11/03/2018, RA recommended CT chest w/o contrast for March 2020, however, it does not appear CT was ever ordered or scheduled. I informed pt of this and also made him aware that imaging is currently on hold d/t COVID-19. Pt verbalized understanding.   Pt denies any new or worsening symptoms. States he completed prednisone 5 mg therapy, but has Rx refill for the medication and is unsure if he should pick it up.   RA, please advise on whether the pt should still be taking prednisone 5 mg and whether pt should receive a CT scan. Thank you.

## 2019-03-04 NOTE — Progress Notes (Signed)
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  Weight stable at 200- 203 lbs  Report: Thoracic impedancetrending above baseline but has returned to normal since 02/22/2019 remote transmission. Prescribed sliding scale Torsemide by Dr Sallyanne Kuster No diuretics if under 200 lb with no KCl Torsemide 40 mg daily if weight is 200-205 lb, with KCL 20 mEq daily Torsemide 80 mg if weight if >205 lb, with KCl 40 mEqdaily  Labs: 12/30/2018 Creatinine 3.19, BUN 36, Potassium 4.7, Sodium 141, GFR 17-19 11/30/2018 Creatinine 3.11, BUN 60, Potassium 4.8, Sodium 137, GFR 17-20 10/04/2018 Creatinine 2.82, BUN 43, Potassium 4.0, Sodium 139, GFR 19-22 09/24/2018 Creatinine 2.82, BUN 31, Potassium 4.0, Sodium 138, GFR 19-22 09/02/2018 Creatinine2.73, BUN38, Potassium3.6, Sodium139, GFR20-23 08/30/2018 Creatinine2.80, BUN40, Potassium3.9, [id number], [id number]  08/17/2018 Creatinine2.57, BUN40, Potassium3.7, Sodium135, GFR21-24  08/16/2018 Creatinine2.84, BUN38, Potassium3.7, [id number], [id number]  08/15/2018 Creatinine3.31, BUN40, Potassium3.7, [id number], [id number] A complete set of results can be found in Results Review.  Recommendations:Encouraged to call for any fluid symptoms.  Follow-up plan: ICM clinic phone appointment on5/19/2020.    Copy of ICM check sent to Dr.Taylor and Dr Sallyanne Kuster.  3 month ICM trend: 03/02/2019    1 Year ICM trend:       Rosalene Billings, RN 03/04/2019 2:33 PM

## 2019-03-04 NOTE — Telephone Encounter (Signed)
Message below routed to Geraldo Pitter, NP to to clarify:  Should this be a high resolution chest CT? And would this be due to Sarcoidosis or another diagnosis?  Thanks.

## 2019-03-04 NOTE — Telephone Encounter (Signed)
With Dr. Elsworth Soho being at hospital due to [name], routing to APP for them to follow up on for pt. Ruben Conner, please advise on this for pt. Thanks!

## 2019-03-04 NOTE — Progress Notes (Signed)
Thank you :)

## 2019-03-04 NOTE — Telephone Encounter (Signed)
Starting February 15, okay to discontinue prednisone. Needs repeat CT chest in June, can you order under Dr. Elsworth Soho?

## 2019-03-04 NOTE — Telephone Encounter (Signed)
Order for CT Chest w/o contrast has been ordered as specified by Volanda Napoleon NP and Dr Bari Mantis last office note Called spoke with patient, informed him of the above and of the recommendations for his prednisone Patient voiced his understanding reporting that his pharmacy had filled that medication and that is why he was unsure if he should take it.  Patient stated to me that he did not refill that medication.  Nothing further needed; will sign off.

## 2019-03-04 NOTE — Telephone Encounter (Signed)
CT chest without contrast per Dr. Bari Mantis last note. Re: Mediastinal lymphadenopathy Thank you

## 2019-03-09 ENCOUNTER — Ambulatory Visit: Payer: PPO | Admitting: Family Medicine

## 2019-03-17 DIAGNOSIS — M1712 Unilateral primary osteoarthritis, left knee: Secondary | ICD-10-CM | POA: Diagnosis not present

## 2019-03-21 DIAGNOSIS — R404 Transient alteration of awareness: Secondary | ICD-10-CM | POA: Diagnosis not present

## 2019-04-04 ENCOUNTER — Telehealth: Payer: Self-pay | Admitting: Internal Medicine

## 2019-04-04 NOTE — Telephone Encounter (Signed)
Ruben Conner contacted [facility] to report that pt and his wife are both deceased (also documented in wife's chart) and to request return kits for their PPM monitors. Unenrolled from [facility], monitor return kit ordered to preferred address. [employer] updated. Routed to Dr. Lovena Le and Margarita Grizzle, [place], as Juluis Rainier.

## 2019-04-18 DIAGNOSIS — 419620001 Death: Secondary | SNOMED CT | POA: Diagnosis not present

## 2019-04-18 DEATH — deceased

## 2019-06-07 ENCOUNTER — Ambulatory Visit: Payer: PPO | Admitting: Cardiovascular Disease
